# Patient Record
Sex: Female | Born: 1965 | Race: White | Hispanic: No | Marital: Married | Smoking: Current every day smoker
Health system: Southern US, Community
[De-identification: ages and names within clinical notes are randomized; demographics above are authoritative.]

## PROBLEM LIST (undated history)

## (undated) DIAGNOSIS — I499 Cardiac arrhythmia, unspecified: Secondary | ICD-10-CM

## (undated) DIAGNOSIS — E7800 Pure hypercholesterolemia, unspecified: Secondary | ICD-10-CM

## (undated) DIAGNOSIS — J449 Chronic obstructive pulmonary disease, unspecified: Secondary | ICD-10-CM

## (undated) DIAGNOSIS — J45909 Unspecified asthma, uncomplicated: Secondary | ICD-10-CM

## (undated) DIAGNOSIS — K219 Gastro-esophageal reflux disease without esophagitis: Secondary | ICD-10-CM

## (undated) DIAGNOSIS — F419 Anxiety disorder, unspecified: Secondary | ICD-10-CM

## (undated) DIAGNOSIS — R000 Tachycardia, unspecified: Secondary | ICD-10-CM

## (undated) DIAGNOSIS — R7303 Prediabetes: Secondary | ICD-10-CM

## (undated) HISTORY — DX: Prediabetes: R73.03

## (undated) HISTORY — DX: Tachycardia, unspecified: R00.0

## (undated) HISTORY — DX: Chronic obstructive pulmonary disease, unspecified: J44.9

## (undated) HISTORY — PX: YAG LASER APPLICATION: SHX6189

## (undated) HISTORY — DX: Unspecified asthma, uncomplicated: J45.909

## (undated) HISTORY — PX: EYE SURGERY: SHX253

---

## 1995-08-10 HISTORY — PX: OVARIAN CYST SURGERY: SHX726

## 1998-08-09 HISTORY — PX: TUBAL LIGATION: SHX77

## 2000-12-19 ENCOUNTER — Other Ambulatory Visit: Admission: RE | Admit: 2000-12-19 | Discharge: 2000-12-19 | Payer: Self-pay | Admitting: *Deleted

## 2001-12-02 ENCOUNTER — Emergency Department (HOSPITAL_COMMUNITY): Admission: EM | Admit: 2001-12-02 | Discharge: 2001-12-02 | Payer: Self-pay | Admitting: Internal Medicine

## 2001-12-02 ENCOUNTER — Encounter: Payer: Self-pay | Admitting: Internal Medicine

## 2002-05-29 ENCOUNTER — Ambulatory Visit (HOSPITAL_COMMUNITY): Admission: RE | Admit: 2002-05-29 | Discharge: 2002-05-29 | Payer: Self-pay | Admitting: *Deleted

## 2002-05-29 ENCOUNTER — Encounter: Payer: Self-pay | Admitting: *Deleted

## 2002-10-11 ENCOUNTER — Encounter: Payer: Self-pay | Admitting: Family Medicine

## 2002-10-11 ENCOUNTER — Ambulatory Visit (HOSPITAL_COMMUNITY): Admission: RE | Admit: 2002-10-11 | Discharge: 2002-10-11 | Payer: Self-pay | Admitting: Family Medicine

## 2004-05-28 ENCOUNTER — Ambulatory Visit (HOSPITAL_COMMUNITY): Admission: RE | Admit: 2004-05-28 | Discharge: 2004-05-28 | Payer: Self-pay | Admitting: *Deleted

## 2004-11-02 ENCOUNTER — Ambulatory Visit (HOSPITAL_COMMUNITY): Admission: RE | Admit: 2004-11-02 | Discharge: 2004-11-02 | Payer: Self-pay | Admitting: Family Medicine

## 2005-05-31 ENCOUNTER — Ambulatory Visit (HOSPITAL_COMMUNITY): Admission: RE | Admit: 2005-05-31 | Discharge: 2005-05-31 | Payer: Self-pay | Admitting: *Deleted

## 2005-10-06 ENCOUNTER — Ambulatory Visit (HOSPITAL_COMMUNITY): Admission: RE | Admit: 2005-10-06 | Discharge: 2005-10-06 | Payer: Self-pay | Admitting: Family Medicine

## 2006-06-29 ENCOUNTER — Ambulatory Visit (HOSPITAL_COMMUNITY): Admission: RE | Admit: 2006-06-29 | Discharge: 2006-06-29 | Payer: Self-pay | Admitting: Family Medicine

## 2007-03-29 ENCOUNTER — Ambulatory Visit (HOSPITAL_COMMUNITY): Admission: RE | Admit: 2007-03-29 | Discharge: 2007-03-29 | Payer: Self-pay | Admitting: Family Medicine

## 2008-01-11 ENCOUNTER — Ambulatory Visit (HOSPITAL_COMMUNITY): Admission: RE | Admit: 2008-01-11 | Discharge: 2008-01-11 | Payer: Self-pay | Admitting: Family Medicine

## 2008-02-27 ENCOUNTER — Ambulatory Visit (HOSPITAL_COMMUNITY): Admission: RE | Admit: 2008-02-27 | Discharge: 2008-02-27 | Payer: Self-pay | Admitting: Family Medicine

## 2008-07-03 ENCOUNTER — Ambulatory Visit (HOSPITAL_COMMUNITY): Admission: RE | Admit: 2008-07-03 | Discharge: 2008-07-03 | Payer: Self-pay | Admitting: Family Medicine

## 2008-12-02 ENCOUNTER — Ambulatory Visit (HOSPITAL_COMMUNITY): Admission: RE | Admit: 2008-12-02 | Discharge: 2008-12-02 | Payer: Self-pay | Admitting: Family Medicine

## 2009-02-28 ENCOUNTER — Ambulatory Visit (HOSPITAL_COMMUNITY): Admission: RE | Admit: 2009-02-28 | Discharge: 2009-02-28 | Payer: Self-pay | Admitting: Family Medicine

## 2009-05-16 ENCOUNTER — Ambulatory Visit (HOSPITAL_COMMUNITY): Admission: RE | Admit: 2009-05-16 | Discharge: 2009-05-16 | Payer: Self-pay | Admitting: Family Medicine

## 2009-09-15 ENCOUNTER — Ambulatory Visit (HOSPITAL_COMMUNITY): Admission: RE | Admit: 2009-09-15 | Discharge: 2009-09-15 | Payer: Self-pay | Admitting: Family Medicine

## 2010-03-02 ENCOUNTER — Ambulatory Visit (HOSPITAL_COMMUNITY): Admission: RE | Admit: 2010-03-02 | Discharge: 2010-03-02 | Payer: Self-pay | Admitting: Family Medicine

## 2010-03-11 ENCOUNTER — Ambulatory Visit (HOSPITAL_COMMUNITY): Admission: RE | Admit: 2010-03-11 | Discharge: 2010-03-11 | Payer: Self-pay | Admitting: Family Medicine

## 2010-07-17 ENCOUNTER — Ambulatory Visit (HOSPITAL_COMMUNITY)
Admission: RE | Admit: 2010-07-17 | Discharge: 2010-07-17 | Payer: Self-pay | Source: Home / Self Care | Attending: Family Medicine | Admitting: Family Medicine

## 2010-08-29 ENCOUNTER — Encounter: Payer: Self-pay | Admitting: Family Medicine

## 2010-08-30 ENCOUNTER — Encounter: Payer: Self-pay | Admitting: Family Medicine

## 2010-09-02 ENCOUNTER — Other Ambulatory Visit (HOSPITAL_COMMUNITY): Payer: Self-pay | Admitting: Family Medicine

## 2010-09-02 DIAGNOSIS — Z09 Encounter for follow-up examination after completed treatment for conditions other than malignant neoplasm: Secondary | ICD-10-CM

## 2010-09-16 ENCOUNTER — Ambulatory Visit (HOSPITAL_COMMUNITY)
Admission: RE | Admit: 2010-09-16 | Discharge: 2010-09-16 | Disposition: A | Discharge status: Home / Self Care | Payer: BC Managed Care – PPO | Source: Ambulatory Visit | Attending: Family Medicine | Admitting: Family Medicine

## 2010-09-16 DIAGNOSIS — R928 Other abnormal and inconclusive findings on diagnostic imaging of breast: Secondary | ICD-10-CM | POA: Insufficient documentation

## 2010-09-16 DIAGNOSIS — Z09 Encounter for follow-up examination after completed treatment for conditions other than malignant neoplasm: Secondary | ICD-10-CM

## 2011-02-25 ENCOUNTER — Other Ambulatory Visit: Payer: Self-pay | Admitting: Family Medicine

## 2011-02-25 DIAGNOSIS — Z09 Encounter for follow-up examination after completed treatment for conditions other than malignant neoplasm: Secondary | ICD-10-CM

## 2011-04-07 ENCOUNTER — Ambulatory Visit (HOSPITAL_COMMUNITY)
Admission: RE | Admit: 2011-04-07 | Discharge: 2011-04-07 | Disposition: A | Discharge status: Home / Self Care | Payer: BC Managed Care – PPO | Source: Ambulatory Visit | Attending: Family Medicine | Admitting: Family Medicine

## 2011-04-07 DIAGNOSIS — Z09 Encounter for follow-up examination after completed treatment for conditions other than malignant neoplasm: Secondary | ICD-10-CM

## 2011-04-07 DIAGNOSIS — R928 Other abnormal and inconclusive findings on diagnostic imaging of breast: Secondary | ICD-10-CM | POA: Insufficient documentation

## 2011-08-10 HISTORY — PX: ESOPHAGOGASTRODUODENOSCOPY: SHX1529

## 2011-08-10 HISTORY — PX: COLONOSCOPY: SHX174

## 2011-12-09 LAB — CBC WITH DIFFERENTIAL/PLATELET
%SAT: 26
ALT: 13 U/L (ref 7–35)
Albumin: 5
Bilirubin, Indirect: 0.2
Ferritin: 39 ng/mL (ref 9.0–150.0)
Lipase: 17 units/L (ref 0–53)
TIBC: 396
Total Protein: 7 g/dL
UIBC: 293
platelet count: 304

## 2012-01-11 ENCOUNTER — Encounter: Payer: Self-pay | Admitting: Urgent Care

## 2012-01-11 ENCOUNTER — Other Ambulatory Visit: Payer: Self-pay | Admitting: Gastroenterology

## 2012-01-11 ENCOUNTER — Ambulatory Visit (INDEPENDENT_AMBULATORY_CARE_PROVIDER_SITE_OTHER): Payer: BC Managed Care – PPO | Admitting: Urgent Care

## 2012-01-11 VITALS — BP 129/75 | HR 81 | Temp 99.5°F | Ht 63.0 in | Wt 102.2 lb

## 2012-01-11 DIAGNOSIS — K219 Gastro-esophageal reflux disease without esophagitis: Secondary | ICD-10-CM

## 2012-01-11 DIAGNOSIS — R110 Nausea: Secondary | ICD-10-CM | POA: Insufficient documentation

## 2012-01-11 DIAGNOSIS — R1310 Dysphagia, unspecified: Secondary | ICD-10-CM

## 2012-01-11 DIAGNOSIS — R197 Diarrhea, unspecified: Secondary | ICD-10-CM

## 2012-01-11 DIAGNOSIS — R1319 Other dysphagia: Secondary | ICD-10-CM | POA: Insufficient documentation

## 2012-01-11 DIAGNOSIS — K529 Noninfective gastroenteritis and colitis, unspecified: Secondary | ICD-10-CM | POA: Insufficient documentation

## 2012-01-11 DIAGNOSIS — R1012 Left upper quadrant pain: Secondary | ICD-10-CM | POA: Insufficient documentation

## 2012-01-11 MED ORDER — DEXLANSOPRAZOLE 60 MG PO CPDR: 60.0000 mg | DELAYED_RELEASE_CAPSULE | Freq: Every day | ORAL | Status: DC

## 2012-01-11 MED ORDER — PEG-KCL-NACL-NASULF-NA ASC-C 100 G PO SOLR: 1.0000 | Freq: Once | ORAL | Status: DC

## 2012-01-11 MED ORDER — HYOSCYAMINE SULFATE 0.125 MG SL SUBL: 0.1250 mg | SUBLINGUAL_TABLET | Freq: Three times a day (TID) | SUBLINGUAL | Status: DC

## 2012-01-11 NOTE — Assessment & Plan Note (Signed)
EGD with possible esophageal dilation with Dr. Jena Gauss to look for esophageal web, ring, or stricture.

## 2012-01-11 NOTE — Patient Instructions (Signed)
Please return stool studies as soon as possible for diarrhea You need a colonoscopy and EGD (upper endoscopy) with possible dilation of your esophagus with Dr. Jena Gauss. Begin Dexilant 60 mg daily for acid reflux Stop Nexium Use Levsin 0.125 mg before meals and at bedtime if you need for diarrhea/cramps 1-800-quit-now for help with quitting smoking

## 2012-01-11 NOTE — Assessment & Plan Note (Signed)
Persistent heartburn indigestion despite PPI. For EGD.  Begin Dexilant 60 mg daily for acid reflux Stop Nexium  1-800-quit-now for help with quitting smoking

## 2012-01-11 NOTE — Assessment & Plan Note (Signed)
See LUQ pain/GERD

## 2012-01-11 NOTE — Assessment & Plan Note (Signed)
History of worsening of chronic diarrhea over the past 3 months.  Self diagnosed with possible irritable bowel syndrome, but symptoms exacerbated with left upper quadrant pain over the last 3 months.  Differentials include irritable bowel syndrome, celiac disease, colorectal carcinoma or polyp, less likely microscopic colitis or pancreatic insufficiency. Colonoscopy for further evaluation.  I have discussed risks & benefits which include, but are not limited to, bleeding, infection, perforation & drug reaction.  The patient agrees with this plan & written consent will be obtained.  Full set of Stool studies if diarrhea persists.  Use Levsin 0.125 mg before meals and at bedtime if you need for diarrhea/cramps

## 2012-01-11 NOTE — Progress Notes (Signed)
Referring Provider: Luking, Scott A, MD Primary Care Physician:  LUKING,SCOTT, MD, MD Primary Gastroenterologist:  Dr. Rourk  Chief Complaint  Patient presents with  . Abdominal Pain  . Nausea    HPI:  Isabel Atkinson is a 45 y.o. female here as a referral from Dr. Luking for gastritis.  She tells me for the past 3mo she has had LUQ pain usually followed by diarrhea.  Normally she has 3 BMs per day, however on the days that she has diarrhea she has problems for an hour straight with multiple loose stools.  She has constant nausea without vomiting.  Felt at first it was due to sinus drainage, but now she is concerned she may have ulcers. Her nausea is worse at night after going to bed.  He tells me she's been under a significant amount of stress after the loss of her father 4 years ago. She has tried nexium 40mg daily for the last month. She reports a 50% relief of her symptoms. She continues to have daily heartburn and indigestion.  Denies rectal bleeding or melena.  Wt stable.  Denies anorexia. She rarely takes ibuprofen a couple times per mo.  she is having problems with solid food dysphagia and feels as though things like french fries stick in her chest.  At times she feels like she may be having a heart attack after swallowing chest pain.  No new meds.  Labs reviewed from 12/09/11: Iron normal, TIBC normal, CBC normal, LFTs normal, lipase and ferritin normal.  No past medical history on file.  Past Surgical History  Procedure Date  . Tubal ligation 2000  . Ovarian cyst surgery 1997    removed    Current Outpatient Prescriptions  Medication Sig Dispense Refill  . acetaminophen (TYLENOL) 500 MG tablet Take 500 mg by mouth every 6 (six) hours as needed.      . aspirin 81 MG tablet Take 81 mg by mouth daily.      . ibuprofen (ADVIL,MOTRIN) 200 MG tablet Take 200 mg by mouth every 6 (six) hours as needed.      . Multiple Vitamin (MULTIVITAMIN) capsule Take 1 capsule by mouth daily.      .  NEXIUM 40 MG capsule Take 40 mg by mouth daily before breakfast.       . dexlansoprazole (DEXILANT) 60 MG capsule Take 1 capsule (60 mg total) by mouth daily.  31 capsule  2  . hyoscyamine (LEVSIN SL) 0.125 MG SL tablet Place 1 tablet (0.125 mg total) under the tongue 4 (four) times daily -  before meals and at bedtime. When necessary diarrhea abdominal pain  90 tablet  1  . peg 3350 powder (MOVIPREP) 100 G SOLR Take 1 kit (100 g total) by mouth once. As directed Please purchase 1 Fleets enema to use with the prep  1 kit  0    Allergies as of 01/11/2012 - Review Complete 01/11/2012  Allergen Reaction Noted  . Sulfa antibiotics Itching 01/11/2012    Family History  Problem Relation Age of Onset  . Colon cancer Maternal Grandfather 80  . Colon polyps Mother 64  . Colon cancer Maternal Uncle 65    History   Social History  . Marital Status: Married    Spouse Name: N/A    Number of Children: 2  . 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 Smoking status: Current Everyday Smoker -- 1.0   packs/day for 25 years    Types: Cigarettes  . Smokeless tobacco: Not on file  . Alcohol Use: No  . Drug Use: No  . Sexually Active: Not on file   Other Topics Concern  . Not on file   Social History Narrative   2 children -21/13   Review of Systems: Gen: Denies any fever, chills, sweats, anorexia, fatigue, weakness, malaise, weight loss, and sleep disorder CV: Denies chest pain, angina, palpitations, syncope, orthopnea, PND, peripheral edema, and claudication. Resp: Denies dyspnea at rest, dyspnea with exercise, cough, sputum, wheezing, coughing up blood, and pleurisy. GI: Denies vomiting blood, jaundice, and fecal incontinence.   GU : Denies urinary burning, blood in urine, urinary frequency, urinary hesitancy, nocturnal urination, and urinary incontinence. MS: Denies joint pain, limitation of movement, and swelling, stiffness, low  back pain, extremity pain. Denies muscle weakness, cramps, atrophy.  Derm: Denies rash, itching, dry skin, hives, moles, warts, or unhealing ulcers.  Psych: Denies depression, anxiety, memory loss, suicidal ideation, hallucinations, paranoia, and confusion. Heme: Denies bruising, bleeding, and enlarged lymph nodes. Neuro:  Denies any headaches, dizziness, paresthesias. Endo:  Denies any problems with DM, thyroid, adrenal function.  Physical Exam: BP 129/75  Pulse 81  Temp(Src) 99.5 F (37.5 C) (Temporal)  Ht 5' 3" (1.6 m)  Wt 102 lb 3.2 oz (46.358 kg)  BMI 18.10 kg/m2 General:   Alert,  Well-developed, thin, pleasant and cooperative in NAD. Head:  Normocephalic and atraumatic. Eyes:  Sclera clear, no icterus.   Conjunctiva pink. Ears:  Normal auditory acuity. Nose:  No deformity, discharge, or lesions. Mouth:  No deformity or lesions,oropharynx pink & moist. Neck:  Supple; no masses or thyromegaly. Lungs:  Clear throughout to auscultation.   No wheezes, crackles, or rhonchi. No acute distress. Heart:  Regular rate and rhythm; no murmurs, clicks, rubs,  or gallops. Abdomen:  Normal bowel sounds.  No bruits.  Soft, non-tender and non-distended without masses, hepatosplenomegaly or hernias noted.  No guarding or rebound tenderness.   Rectal:  Deferred. Msk:  Symmetrical without gross deformities. Normal posture. Pulses:  Normal pulses noted. Extremities:  No clubbing or edema. Neurologic:  Alert and oriented x4;  grossly normal neurologically. Skin:  Intact without significant lesions or rashes. Lymph Nodes:  No significant cervical adenopathy. Psych:  Alert and cooperative. Normal mood and affect.  

## 2012-01-11 NOTE — Assessment & Plan Note (Signed)
Isabel Atkinson is a pleasant 46 y.o. female with three-month history of left upper quadrant pain, nausea, heart burn, indigestion and intermittent diarrhea.  He has had minimal relief with PPI.  EGD for further evaluation to rule out gastritis, H. pylori, peptic ulcer disease, celiac disease.  I have discussed risks & benefits which include, but are not limited to, bleeding, infection, perforation & drug reaction.  The patient agrees with this plan & written consent will be obtained.

## 2012-01-12 NOTE — Progress Notes (Signed)
Quick Note:  Reviewed @ office visit ______ 

## 2012-01-12 NOTE — Progress Notes (Signed)
Faxed to PCP

## 2012-01-18 ENCOUNTER — Telehealth: Payer: Self-pay | Admitting: Urgent Care

## 2012-01-18 NOTE — Telephone Encounter (Signed)
Please call to let patient know insurance is requiring she take omeprazole prior to filling Dexilant. Omeprazole 20 mg daily, #31,  2 RF. Call and let us know if not working. Thanks

## 2012-01-18 NOTE — Telephone Encounter (Signed)
Tried to call with no answer  

## 2012-01-19 NOTE — Telephone Encounter (Signed)
Tried to call pt- LMOM 

## 2012-01-19 NOTE — Telephone Encounter (Signed)
Pt aware, rx called into [facility].

## 2012-01-31 ENCOUNTER — Encounter (HOSPITAL_COMMUNITY): Payer: Self-pay | Admitting: Pharmacy Technician

## 2012-02-07 MED ORDER — SODIUM CHLORIDE 0.45 % IV SOLN
Freq: Once | INTRAVENOUS | Status: AC
  Administered 2012-02-08: 20 mL/h via INTRAVENOUS

## 2012-02-08 ENCOUNTER — Ambulatory Visit (HOSPITAL_COMMUNITY)
Admission: RE | Admit: 2012-02-08 | Discharge: 2012-02-08 | Disposition: A | Discharge status: Home / Self Care | Payer: BC Managed Care – PPO | Source: Ambulatory Visit | Attending: Internal Medicine | Admitting: Internal Medicine

## 2012-02-08 ENCOUNTER — Encounter (HOSPITAL_COMMUNITY): Payer: Self-pay | Admitting: *Deleted

## 2012-02-08 ENCOUNTER — Encounter (HOSPITAL_COMMUNITY)
Admission: RE | Disposition: A | Discharge status: Home / Self Care | Payer: Self-pay | Source: Ambulatory Visit | Attending: Internal Medicine

## 2012-02-08 DIAGNOSIS — R1310 Dysphagia, unspecified: Secondary | ICD-10-CM

## 2012-02-08 DIAGNOSIS — R197 Diarrhea, unspecified: Secondary | ICD-10-CM | POA: Insufficient documentation

## 2012-02-08 DIAGNOSIS — D126 Benign neoplasm of colon, unspecified: Secondary | ICD-10-CM

## 2012-02-08 DIAGNOSIS — K219 Gastro-esophageal reflux disease without esophagitis: Secondary | ICD-10-CM

## 2012-02-08 HISTORY — DX: Anxiety disorder, unspecified: F41.9

## 2012-02-08 SURGERY — COLONOSCOPY WITH ESOPHAGOGASTRODUODENOSCOPY (EGD)
Anesthesia: Moderate Sedation

## 2012-02-08 MED ORDER — MIDAZOLAM HCL 5 MG/5ML IJ SOLN
INTRAMUSCULAR | Status: AC
  Filled 2012-02-08: qty 10

## 2012-02-08 MED ORDER — MEPERIDINE HCL 100 MG/ML IJ SOLN
INTRAMUSCULAR | Status: AC
  Filled 2012-02-08: qty 1

## 2012-02-08 MED ORDER — BUTAMBEN-TETRACAINE-BENZOCAINE 2-2-14 % EX AERO
INHALATION_SPRAY | CUTANEOUS | Status: DC | PRN
  Administered 2012-02-08: 2 via TOPICAL

## 2012-02-08 MED ORDER — MIDAZOLAM HCL 5 MG/5ML IJ SOLN
INTRAMUSCULAR | Status: DC | PRN
  Administered 2012-02-08 (×2): 1 mg via INTRAVENOUS
  Administered 2012-02-08 (×2): 2 mg via INTRAVENOUS

## 2012-02-08 MED ORDER — STERILE WATER FOR IRRIGATION IR SOLN
Status: DC | PRN
  Administered 2012-02-08: 08:00:00

## 2012-02-08 MED ORDER — MEPERIDINE HCL 100 MG/ML IJ SOLN
INTRAMUSCULAR | Status: DC | PRN
  Administered 2012-02-08: 25 mg via INTRAVENOUS
  Administered 2012-02-08: 50 mg via INTRAVENOUS
  Administered 2012-02-08: 25 mg via INTRAVENOUS

## 2012-02-08 NOTE — Interval H&P Note (Signed)
History and Physical Interval Note:  02/08/2012 7:50 AM  Isabel Atkinson  has presented today for surgery, with the diagnosis of GERD, LUQ pain, chronic diarrhea,nausea  The various methods of treatment have been discussed with the patient and family. After consideration of risks, benefits and other options for treatment, the patient has consented to  Procedure(s) (LRB): COLONOSCOPY WITH ESOPHAGOGASTRODUODENOSCOPY (EGD) (N/A) as a surgical intervention .  The patient's history has been reviewed, patient examined, no change in status, stable for surgery.  I have reviewed the patients' chart and labs.  Questions were answered to the patient's satisfaction.     Eula Listen

## 2012-02-08 NOTE — Discharge Instructions (Addendum)
Colonoscopy Discharge Instructions  Read the instructions outlined below and refer to this sheet in the next few weeks. These discharge instructions provide you with general information on caring for yourself after you leave the hospital. Your doctor may also give you specific instructions. While your treatment has been planned according to the most current medical practices available, unavoidable complications occasionally occur. If you have any problems or questions after discharge, call Dr. Gala Romney at [phone]. ACTIVITY  You may resume your regular activity, but move at a slower pace for the next 24 hours.   Take frequent rest periods for the next 24 hours.   Walking will help get rid of the air and reduce the bloated feeling in your belly (abdomen).   No driving for 24 hours (because of the medicine (anesthesia) used during the test).    Do not sign any important legal documents or operate any machinery for 24 hours (because of the anesthesia used during the test).  NUTRITION  Drink plenty of fluids.   You may resume your normal diet as instructed by your doctor.   Begin with a light meal and progress to your normal diet. Heavy or fried foods are harder to digest and may make you feel sick to your stomach (nauseated).   Avoid alcoholic beverages for 24 hours or as instructed.  MEDICATIONS  You may resume your normal medications unless your doctor tells you otherwise.  WHAT YOU CAN EXPECT TODAY  Some feelings of bloating in the abdomen.   Passage of more gas than usual.   Spotting of blood in your stool or on the toilet paper.  IF YOU HAD POLYPS REMOVED DURING THE COLONOSCOPY:  No aspirin products for 7 days or as instructed.   No alcohol for 7 days or as instructed.   Eat a soft diet for the next 24 hours.  FINDING OUT THE RESULTS OF YOUR TEST Not all test results are available during your visit. If your test results are not back during the visit, make an appointment  with your caregiver to find out the results. Do not assume everything is normal if you have not heard from your caregiver or the medical facility. It is important for you to follow up on all of your test results.  SEEK IMMEDIATE MEDICAL ATTENTION IF:  You have more than a spotting of blood in your stool.   Your belly is swollen (abdominal distention).   You are nauseated or vomiting.   You have a temperature over 101.  You have abdominal pain or discomfort that is severe or gets worse throughout the day. EGD Discharge instructions Please read the instructions outlined below and refer to this sheet in the next few weeks. These discharge instructions provide you with general information on caring for yourself after you leave the hospital. Your doctor may also give you specific instructions. While your treatment has been planned according to the most current medical practices available, unavoidable complications occasionally occur. If you have any problems or questions after discharge, please call your doctor. ACTIVITY You may resume your regular activity but move at a slower pace for the next 24 hours.  Take frequent rest periods for the next 24 hours.  Walking will help expel (get rid of) the air and reduce the bloated feeling in your abdomen.  No driving for 24 hours (because of the anesthesia (medicine) used during the test).  You may shower.  Do not sign any important legal documents or operate any machinery for 24  hours (because of the anesthesia used during the test).  NUTRITION Drink plenty of fluids.  You may resume your normal diet.  Begin with a light meal and progress to your normal diet.  Avoid alcoholic beverages for 24 hours or as instructed by your caregiver.  MEDICATIONS You may resume your normal medications unless your caregiver tells you otherwise.  WHAT YOU CAN EXPECT TODAY You may experience abdominal discomfort such as a feeling of fullness or "gas" pains.   FOLLOW-UP Your doctor will discuss the results of your test with you.  SEEK IMMEDIATE MEDICAL ATTENTION IF ANY OF THE FOLLOWING OCCUR: Excessive nausea (feeling sick to your stomach) and/or vomiting.  Severe abdominal pain and distention (swelling).  Trouble swallowing.  Temperature over 101 F (37.8 C).  Rectal bleeding or vomiting of blood.    GERD and polyp information provided.  Continue Dexilant 60 mg orally daily.  Further recommendations to follow pending review of pathology report   No driving for 24 hours.   Colon Polyps A polyp is extra tissue that grows inside your body. Colon polyps grow in the large intestine. The large intestine, also called the colon, is part of your digestive system. It is a long, hollow tube at the end of your digestive tract where your body makes and stores stool. Most polyps are not dangerous. They are benign. This means they are not cancerous. But over time, some types of polyps can turn into cancer. Polyps that are smaller than a pea are usually not harmful. But larger polyps could someday become or may already be cancerous. To be safe, doctors remove all polyps and test them.  WHO GETS POLYPS? Anyone can get polyps, but certain people are more likely than others. You may have a greater chance of getting polyps if: You are over 50.  You have had polyps before.  Someone in your family has had polyps.  Someone in your family has had cancer of the large intestine.  Find out if someone in your family has had polyps. You may also be more likely to get polyps if you:  Eat a lot of fatty foods.  Smoke.  Drink alcohol.  Do not exercise.  Eat too much.  SYMPTOMS  Most small polyps do not cause symptoms. People often do not know they have one until their caregiver finds it during a regular checkup or while testing them for something else. Some people do have symptoms like these: Bleeding from the anus. You might notice blood on your underwear or on  toilet paper after you have had a bowel movement.  Constipation or diarrhea that lasts more than a week.  Blood in the stool. Blood can make stool look black or it can show up as red streaks in the stool.  If you have any of these symptoms, see your caregiver. HOW DOES THE DOCTOR TEST FOR POLYPS? The doctor can use four tests to check for polyps: Digital rectal exam. The caregiver wears gloves and checks your rectum (the last part of the large intestine) to see if it feels normal. This test would find polyps only in the rectum. Your caregiver may need to do one of the other tests listed below to find polyps higher up in the intestine.  Barium enema. The caregiver puts a liquid called barium into your rectum before taking x-rays of your large intestine. Barium makes your intestine look white in the pictures. Polyps are dark, so they are easy to see.  Sigmoidoscopy. With this  test, the caregiver can see inside your large intestine. A thin flexible tube is placed into your rectum. The device is called a sigmoidoscope, which has a light and a tiny video camera in it. The caregiver uses the sigmoidoscope to look at the last third of your large intestine.  Colonoscopy. This test is like sigmoidoscopy, but the caregiver looks at all of the large intestine. It usually requires sedation. This is the most common method for finding and removing polyps.  TREATMENT  The caregiver will remove the polyp during sigmoidoscopy or colonoscopy. The polyp is then tested for cancer.  If you have had polyps, your caregiver may want you to get tested regularly in the future.  PREVENTION  There is not one sure way to prevent polyps. You might be able to lower your risk of getting them if you: Eat more fruits and vegetables and less fatty food.  Do not smoke.  Avoid alcohol.  Exercise every day.  Lose weight if you are overweight.  Eating more calcium and folate can also lower your risk of getting polyps. Some foods that  are rich in calcium are milk, cheese, and broccoli. Some foods that are rich in folate are chickpeas, kidney beans, and spinach.  Aspirin might help prevent polyps. Studies are under way.  Document Released: 04/21/2004 Document Revised: 07/15/2011 Document Reviewed: 09/27/2007 [facility] Patient Information 2012 [place], [place].Gastroesophageal Reflux Disease, Adult Gastroesophageal reflux disease (GERD) happens when acid from your stomach flows up into the esophagus. When acid comes in contact with the esophagus, the acid causes soreness (inflammation) in the esophagus. Over time, GERD may create small holes (ulcers) in the lining of the esophagus. CAUSES   Increased body weight. This puts pressure on the stomach, making acid rise from the stomach into the esophagus.   Smoking. This increases acid production in the stomach.   Drinking alcohol. This causes decreased pressure in the lower esophageal sphincter (valve or ring of muscle between the esophagus and stomach), allowing acid from the stomach into the esophagus.   Late evening meals and a full stomach. This increases pressure and acid production in the stomach.   A malformed lower esophageal sphincter.  Sometimes, no cause is found. SYMPTOMS   Burning pain in the lower part of the mid-chest behind the breastbone and in the mid-stomach area. This may occur twice a week or more often.   Trouble swallowing.   Sore throat.   Dry cough.   Asthma-like symptoms including chest tightness, shortness of breath, or wheezing.  DIAGNOSIS  Your caregiver may be able to diagnose GERD based on your symptoms. In some cases, X-rays and other tests may be done to check for complications or to check the condition of your stomach and esophagus. TREATMENT  Your caregiver may recommend over-the-counter or prescription medicines to help decrease acid production. Ask your caregiver before starting or adding any new medicines.  HOME CARE INSTRUCTIONS    Change the factors that you can control. Ask your caregiver for guidance concerning weight loss, quitting smoking, and alcohol consumption.   Avoid foods and drinks that make your symptoms worse, such as:   Caffeine or alcoholic drinks.   Chocolate.   Peppermint or mint flavorings.   Garlic and onions.   Spicy foods.   Citrus fruits, such as oranges, lemons, or limes.   Tomato-based foods such as sauce, chili, salsa, and pizza.   Fried and fatty foods.   Avoid lying down for the 3 hours prior to your bedtime  or prior to taking a nap.   Eat small, frequent meals instead of large meals.   Wear loose-fitting clothing. Do not wear anything tight around your waist that causes pressure on your stomach.   Raise the head of your bed 6 to 8 inches with wood blocks to help you sleep. Extra pillows will not help.   Only take over-the-counter or prescription medicines for pain, discomfort, or fever as directed by your caregiver.   Do not take aspirin, ibuprofen, or other nonsteroidal anti-inflammatory drugs (NSAIDs).  SEEK IMMEDIATE MEDICAL CARE IF:   You have pain in your arms, neck, jaw, teeth, or back.   Your pain increases or changes in intensity or duration.   You develop nausea, vomiting, or sweating (diaphoresis).   You develop shortness of breath, or you faint.   Your vomit is green, yellow, black, or looks like coffee grounds or blood.   Your stool is red, bloody, or black.  These symptoms could be signs of other problems, such as heart disease, gastric bleeding, or esophageal bleeding. MAKE SURE YOU:   Understand these instructions.   Will watch your condition.   Will get help right away if you are not doing well or get worse.  Document Released: 05/05/2005 Document Revised: 07/15/2011 Document Reviewed: 02/12/2011 [facility] Patient Information 2012 [place], [place].

## 2012-02-08 NOTE — Op Note (Signed)
[facility] [address] [place], [place]  [address]  COLONOSCOPY PROCEDURE REPORT  PATIENT:  Isabel, Atkinson  MR#:  [id number] BIRTHDATE:  [birth year], 45 yrs. old  GENDER:  female ENDOSCOPIST:  R. Roetta Sessions, MD FACP [facility] REF. BY:  Lilyan Punt, M.D. PROCEDURE DATE:  02/08/2012 PROCEDURE:  Ileocolonoscopy with biopsy  INDICATIONS:  Chronic diarrhea. See EGD report  INFORMED CONSENT:  The risks, benefits, alternatives and imponderables including but not limited to bleeding, perforation as well as the possibility of a missed lesion have been reviewed. The potential for biopsy, lesion removal, etc. have also been discussed.  Questions have been answered.  All parties agreeable. Please see the history and physical in the medical record for more information.  MEDICATIONS:  Demerol 100 mg IV and Versed 7 mg IV in divided doses.  DESCRIPTION OF PROCEDURE:  After a digital rectal exam was performed, the EC-[id number] ([id number]) colonoscope was advanced from the anus through the rectum and colon to the area of the cecum, ileocecal valve and appendiceal orifice.  The cecum was deeply intubated.  These structures were well-seen and photographed for the record.  From the level of the cecum and ileocecal valve, the scope was slowly and cautiously withdrawn.  The mucosal surfaces were carefully surveyed utilizing scope tip deflection to facilitate fold flattening as needed.  The scope was pulled down into the rectum where a thorough examination was performed. <<PROCEDUREIMAGES>>  FINDINGS: Good preparation. Anal papilla; otherwise normal rectum. Rectal vault too small to retroflex. Rectal mucosal well-seen on face. Normal-appearing colonic mucosa aside from a single diminutive sigmoid polyp. The distal 10 cm of terminal ileum mucosa appeared normal.  THERAPEUTIC / DIAGNOSTIC MANEUVERS PERFORMED:  Signal biopsies of the ascending and sigmoid segments were taken to evaluate  for microscopic colitis.  The single diminutive sigmoid polyp noted above was cold biopsied/removed.  COMPLICATIONS:  None  CECAL WITHDRAWAL TIME: 40 minutes  IMPRESSION: Normal rectum. Single diminutive sigmoid polyp-removed as described above. Normal terminal ileum .Status post segmental biopsy.  RECOMMENDATIONS:  Follow up on pathology. See EGD report. Continue Dexilant 60 mg orally daily.  ______________________________ R. Roetta Sessions, MD Caleen Essex  CC:  Lilyan Punt, M.D.  n. eSIGNED:   R. Roetta Sessions at 02/08/2012 08:45 AM  Rickey Barbara, [phone]

## 2012-02-08 NOTE — H&P (View-Only) (Signed)
Referring Provider: Babs Sciara, MD Primary Care Physician:  Lilyan Punt, MD, MD Primary Gastroenterologist:  Dr. Jena Gauss  Chief Complaint  Patient presents with  . Abdominal Pain  . Nausea    HPI:  Isabel Atkinson is a 46 y.o. female here as a referral from Dr. Gerda Diss for gastritis.  She tells me for the past 63mo she has had LUQ pain usually followed by diarrhea.  Normally she has 3 BMs per day, however on the days that she has diarrhea she has problems for an hour straight with multiple loose stools.  She has constant nausea without vomiting.  Felt at first it was due to sinus drainage, but now she is concerned she may have ulcers. Her nausea is worse at night after going to bed.  He tells me she's been under a significant amount of stress after the loss of her father 4 years ago. She has tried nexium 40mg  daily for the last month. She reports a 50% relief of her symptoms. She continues to have daily heartburn and indigestion.  Denies rectal bleeding or melena.  Wt stable.  Denies anorexia. She rarely takes ibuprofen a couple times per mo.  she is having problems with solid food dysphagia and feels as though things like french fries stick in her chest.  At times she feels like she may be having a heart attack after swallowing chest pain.  No new meds.  Labs reviewed from 12/09/11: Iron normal, TIBC normal, CBC normal, LFTs normal, lipase and ferritin normal.  No past medical history on file.  Past Surgical History  Procedure Date  . Tubal ligation 2000  . Ovarian cyst surgery 1997    removed    Current Outpatient Prescriptions  Medication Sig Dispense Refill  . acetaminophen (TYLENOL) 500 MG tablet Take 500 mg by mouth every 6 (six) hours as needed.      Marland Kitchen aspirin 81 MG tablet Take 81 mg by mouth daily.      Marland Kitchen ibuprofen (ADVIL,MOTRIN) 200 MG tablet Take 200 mg by mouth every 6 (six) hours as needed.      . Multiple Vitamin (MULTIVITAMIN) capsule Take 1 capsule by mouth daily.      Marland Kitchen  NEXIUM 40 MG capsule Take 40 mg by mouth daily before breakfast.       . dexlansoprazole (DEXILANT) 60 MG capsule Take 1 capsule (60 mg total) by mouth daily.  31 capsule  2  . hyoscyamine (LEVSIN SL) 0.125 MG SL tablet Place 1 tablet (0.125 mg total) under the tongue 4 (four) times daily -  before meals and at bedtime. When necessary diarrhea abdominal pain  90 tablet  1  . peg 3350 powder (MOVIPREP) 100 G SOLR Take 1 kit (100 g total) by mouth once. As directed Please purchase 1 Fleets enema to use with the prep  1 kit  0    Allergies as of 01/11/2012 - Review Complete 01/11/2012  Allergen Reaction Noted  . Sulfa antibiotics Itching 01/11/2012    Family History  Problem Relation Age of Onset  . Colon cancer Maternal Grandfather 80  . Colon polyps Mother 33  . Colon cancer Maternal Uncle 84    History   Social History  . Marital Status: Married    Spouse Name: N/A    Number of Children: 2  . 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 Smoking status: Current Everyday Smoker -- 1.0  packs/day for 25 years    Types: Cigarettes  . Smokeless tobacco: Not on file  . Alcohol Use: No  . Drug Use: No  . Sexually Active: Not on file   Other Topics Concern  . Not on file   Social History Narrative   2 children -21/13   Review of Systems: Gen: Denies any fever, chills, sweats, anorexia, fatigue, weakness, malaise, weight loss, and sleep disorder CV: Denies chest pain, angina, palpitations, syncope, orthopnea, PND, peripheral edema, and claudication. Resp: Denies dyspnea at rest, dyspnea with exercise, cough, sputum, wheezing, coughing up blood, and pleurisy. GI: Denies vomiting blood, jaundice, and fecal incontinence.   GU : Denies urinary burning, blood in urine, urinary frequency, urinary hesitancy, nocturnal urination, and urinary incontinence. MS: Denies joint pain, limitation of movement, and swelling, stiffness, low  back pain, extremity pain. Denies muscle weakness, cramps, atrophy.  Derm: Denies rash, itching, dry skin, hives, moles, warts, or unhealing ulcers.  Psych: Denies depression, anxiety, memory loss, suicidal ideation, hallucinations, paranoia, and confusion. Heme: Denies bruising, bleeding, and enlarged lymph nodes. Neuro:  Denies any headaches, dizziness, paresthesias. Endo:  Denies any problems with DM, thyroid, adrenal function.  Physical Exam: BP 129/75  Pulse 81  Temp(Src) 99.5 F (37.5 C) (Temporal)  Ht 5\' 3"  (1.6 m)  Wt 102 lb 3.2 oz (46.358 kg)  BMI 18.10 kg/m2 General:   Alert,  Well-developed, thin, pleasant and cooperative in NAD. Head:  Normocephalic and atraumatic. Eyes:  Sclera clear, no icterus.   Conjunctiva pink. Ears:  Normal auditory acuity. Nose:  No deformity, discharge, or lesions. Mouth:  No deformity or lesions,oropharynx pink & moist. Neck:  Supple; no masses or thyromegaly. Lungs:  Clear throughout to auscultation.   No wheezes, crackles, or rhonchi. No acute distress. Heart:  Regular rate and rhythm; no murmurs, clicks, rubs,  or gallops. Abdomen:  Normal bowel sounds.  No bruits.  Soft, non-tender and non-distended without masses, hepatosplenomegaly or hernias noted.  No guarding or rebound tenderness.   Rectal:  Deferred. Msk:  Symmetrical without gross deformities. Normal posture. Pulses:  Normal pulses noted. Extremities:  No clubbing or edema. Neurologic:  Alert and oriented x4;  grossly normal neurologically. Skin:  Intact without significant lesions or rashes. Lymph Nodes:  No significant cervical adenopathy. Psych:  Alert and cooperative. Normal mood and affect.

## 2012-02-08 NOTE — Op Note (Signed)
[facility] [address] [place], [place]  [address]  ENDOSCOPY PROCEDURE REPORT  PATIENT:  Isabel, Atkinson  MR#:  [id number] BIRTHDATE:  1965/08/21, 45 yrs. old  GENDER:  female  ENDOSCOPIST:  R. Roetta Sessions, MD Caleen Essex Referred by:  Lilyan Punt, M.D.  PROCEDURE DATE:  02/08/2012 PROCEDURE:  EGD with Elease Hashimoto dilation followed by small bowel biopsy  INDICATIONS:   GERD and esophageal dysphagia. Chronic diarrhea  INFORMED CONSENT:   The risks, benefits, limitations, alternatives and imponderables have been discussed.  The potential for biopsy, esophogeal dilation, etc. have also been reviewed.  Questions have been answered.  All parties agreeable.  Please see the history and physical in the medical record for more information.  MEDICATIONS:     Versed 5 mg IV and Demerol 100 mg IV in divided doses.  DESCRIPTION OF PROCEDURE:   The EG-[id number] ([id number]) endoscope was introduced through the mouth and advanced to the second portion of the duodenum without difficulty or limitations.  The mucosal surfaces were surveyed very carefully during advancement of the scope and upon withdrawal.  Retroflexion view of the proximal stomach and esophagogastric junction was performed.  <<PROCEDUREIMAGES>>  FINDINGS:  Normal tubular esophagus. Stomach empty.  Small hiatal hernia. Pylorus patent. Normal first and second portion of the duodenum  THERAPEUTIC / DIAGNOSTIC MANEUVERS PERFORMED:  A 54 French Maloney dilator was passed a full insertion easily. Look back revealed a tear the UES mucosa and disruption of a possible UES web. Subsequently, biopsies of the second and third portion of the duodenum were taken for histologic study.  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 Small hiatal hernia. Status post biopsy of normal         appearing small bowel to screen for celiac  disease.  RECOMMENDATIONS:    See colonoscopy report.  ______________________________ R. Roetta Sessions, MD Caleen Essex  CC:  n. eSIGNED:   R. Roetta Sessions at 02/08/2012 08:15 AM  Rickey Barbara, [phone]

## 2012-02-13 ENCOUNTER — Encounter: Payer: Self-pay | Admitting: Internal Medicine

## 2012-02-24 ENCOUNTER — Telehealth: Payer: Self-pay | Admitting: Urgent Care

## 2012-02-24 NOTE — Telephone Encounter (Signed)
Tried to call  Pt- LMOM

## 2012-02-24 NOTE — Progress Notes (Signed)
REVIEWED.  

## 2012-02-24 NOTE — Telephone Encounter (Signed)
Please see if stool studies were ever returned to lab. Thanks

## 2012-02-28 NOTE — Telephone Encounter (Signed)
Mailed letter to pt

## 2012-03-02 ENCOUNTER — Telehealth: Payer: Self-pay | Admitting: Internal Medicine

## 2012-03-02 MED ORDER — DEXLANSOPRAZOLE 60 MG PO CPDR: 60.0000 mg | DELAYED_RELEASE_CAPSULE | Freq: Every morning | ORAL | Status: DC

## 2012-03-02 NOTE — Telephone Encounter (Signed)
Patient is stating that the Prilosec is not work anymore and shes asking for a Rx of Dexilant be called to [place] also she received a letter stating that she was supposed to do a stool study and she had threw the sample bottles away and she will be by the office to pick up new ones

## 2012-03-02 NOTE — Telephone Encounter (Signed)
Prescription filled.

## 2012-03-02 NOTE — Telephone Encounter (Signed)
Routing to refill box  

## 2012-03-16 ENCOUNTER — Other Ambulatory Visit: Payer: Self-pay | Admitting: Urgent Care

## 2012-03-16 LAB — ROTAVIRUS ANTIGEN, STOOL
Cryptosporidium Screen (EIA): NEGATIVE
Giardia Screen - EIA: NEGATIVE
Toxigenic C. Difficile by PCR: NEGATIVE

## 2012-03-17 LAB — GIARDIA/CRYPTOSPORIDIUM (EIA): Cryptosporidium Screen (EIA): NEGATIVE

## 2012-03-17 LAB — FECAL LACTOFERRIN, QUANT: Lactoferrin: NEGATIVE

## 2012-03-17 NOTE — Progress Notes (Signed)
Quick Note:  Please call lab to see what is going on. Pt was supposed to have full set of stool studies in June. It looks like she has decided to do them now, however orders had already been cancelled. Please be sure all stool studies are being processed. Thanks  ______

## 2012-03-20 NOTE — Progress Notes (Signed)
Quick Note:  I need results from stool studies please. Thanks ______

## 2012-03-21 NOTE — Progress Notes (Signed)
Quick Note:  C. Difficile PCR, Giardia/Cryptosporidium, and stool culture are negative. Please call patient and make her aware all of her she'll studies are normal. Keep plan as discussed at time of colonoscopy by Dr. Jena Gauss. CC: LUKING,SCOTT, MD  ______

## 2012-03-21 NOTE — Progress Notes (Signed)
Quick Note:  Routing to Julie, RMR pt. ______ 

## 2012-03-24 NOTE — Progress Notes (Signed)
Quick Note:  Please send letter if unable to contact pt. Thanks ______

## 2012-03-24 NOTE — Progress Notes (Signed)
Quick Note:  Mailed letter to pt ______ 

## 2012-04-03 NOTE — Progress Notes (Signed)
Quick Note:  Please call pt. Her stool studies are all normal. Does pt have FU OV? ZO:XWRUEA,VWUJW, MD  ______

## 2012-04-05 ENCOUNTER — Encounter: Payer: Self-pay | Admitting: Internal Medicine

## 2012-04-17 ENCOUNTER — Ambulatory Visit: Payer: BC Managed Care – PPO | Admitting: Urgent Care

## 2012-05-02 ENCOUNTER — Other Ambulatory Visit: Payer: Self-pay | Admitting: Family Medicine

## 2012-05-02 DIAGNOSIS — IMO0001 Reserved for inherently not codable concepts without codable children: Secondary | ICD-10-CM

## 2012-05-08 ENCOUNTER — Ambulatory Visit (HOSPITAL_COMMUNITY)
Admission: RE | Admit: 2012-05-08 | Discharge: 2012-05-08 | Disposition: A | Discharge status: Home / Self Care | Payer: BC Managed Care – PPO | Source: Ambulatory Visit | Attending: Family Medicine | Admitting: Family Medicine

## 2012-05-08 DIAGNOSIS — IMO0001 Reserved for inherently not codable concepts without codable children: Secondary | ICD-10-CM

## 2012-05-08 DIAGNOSIS — Z1231 Encounter for screening mammogram for malignant neoplasm of breast: Secondary | ICD-10-CM | POA: Insufficient documentation

## 2012-08-18 ENCOUNTER — Other Ambulatory Visit: Payer: Self-pay | Admitting: Family Medicine

## 2012-08-18 DIAGNOSIS — M542 Cervicalgia: Secondary | ICD-10-CM

## 2012-08-24 ENCOUNTER — Other Ambulatory Visit: Payer: Self-pay | Admitting: Family Medicine

## 2012-08-24 ENCOUNTER — Ambulatory Visit (HOSPITAL_COMMUNITY)
Admission: RE | Admit: 2012-08-24 | Discharge: 2012-08-24 | Disposition: A | Discharge status: Home / Self Care | Payer: BC Managed Care – PPO | Source: Ambulatory Visit | Attending: Family Medicine | Admitting: Family Medicine

## 2012-08-24 DIAGNOSIS — M542 Cervicalgia: Secondary | ICD-10-CM

## 2012-08-24 DIAGNOSIS — R209 Unspecified disturbances of skin sensation: Secondary | ICD-10-CM | POA: Insufficient documentation

## 2012-08-24 DIAGNOSIS — M5020 Other cervical disc displacement, unspecified cervical region: Secondary | ICD-10-CM | POA: Insufficient documentation

## 2012-08-24 DIAGNOSIS — M25512 Pain in left shoulder: Secondary | ICD-10-CM

## 2012-10-07 HISTORY — PX: NECK SURGERY: SHX720

## 2012-12-05 DIAGNOSIS — Z029 Encounter for administrative examinations, unspecified: Secondary | ICD-10-CM

## 2012-12-11 ENCOUNTER — Telehealth: Payer: Self-pay | Admitting: Family Medicine

## 2012-12-11 NOTE — Telephone Encounter (Signed)
Patient needs something called in for UTI to [facility]. She said that sometimes we will do this for her.

## 2012-12-11 NOTE — Telephone Encounter (Signed)
Cipro 250 mg 1 twice a day for 5 days.called into [facility] pt advised to follow up If any complications or problems.

## 2012-12-11 NOTE — Telephone Encounter (Signed)
Cipro 250 mg 1 twice a day for 5 days. If any complications or problems must followup.

## 2012-12-27 ENCOUNTER — Encounter: Payer: Self-pay | Admitting: Family Medicine

## 2012-12-27 ENCOUNTER — Ambulatory Visit (INDEPENDENT_AMBULATORY_CARE_PROVIDER_SITE_OTHER): Payer: BC Managed Care – PPO | Admitting: Family Medicine

## 2012-12-27 VITALS — BP 116/77 | Temp 99.4°F | Wt 103.8 lb

## 2012-12-27 DIAGNOSIS — J019 Acute sinusitis, unspecified: Secondary | ICD-10-CM

## 2012-12-27 DIAGNOSIS — Z0000 Encounter for general adult medical examination without abnormal findings: Secondary | ICD-10-CM

## 2012-12-27 LAB — POCT URINALYSIS DIPSTICK

## 2012-12-27 MED ORDER — CEFTRIAXONE SODIUM 1 G IJ SOLR
500.0000 mg | Freq: Once | INTRAMUSCULAR | Status: AC
  Administered 2012-12-27: 500 mg via INTRAMUSCULAR

## 2012-12-27 MED ORDER — LEVOFLOXACIN 500 MG PO TABS: 500.0000 mg | ORAL_TABLET | Freq: Every day | ORAL | Status: AC

## 2012-12-27 NOTE — Progress Notes (Signed)
  Subjective:    Patient ID: Isabel Atkinson, female    DOB: May 02, 1966, 47 y.o.   MRN: [id number]  Cough This is a new problem. The current episode started 1 to 4 weeks ago. The problem has been gradually worsening. The problem occurs every few minutes. The cough is productive of sputum. Associated symptoms include a fever, headaches, nasal congestion, postnasal drip and sweats. The symptoms are aggravated by lying down. Risk factors for lung disease include smoking/tobacco exposure. She has tried OTC cough suppressant for the symptoms. The treatment provided no relief.   Started fever yest. With chills/aching   Review of Systems  Constitutional: Positive for fever.  HENT: Positive for postnasal drip.   Respiratory: Positive for cough.   Neurological: Positive for headaches.       Objective:   Physical Exam Eardrums normal throat is normal moderate sinus tenderness neck is supple lungs clear heart regular       Assessment & Plan:  Sinusitis-Levaquin 10 days as directed. May need repeat of medicine if doesn't totally help her. Warning signs were discussed. Quit smiking

## 2012-12-27 NOTE — Patient Instructions (Signed)
If worse call 

## 2013-01-19 ENCOUNTER — Ambulatory Visit (INDEPENDENT_AMBULATORY_CARE_PROVIDER_SITE_OTHER): Payer: BC Managed Care – PPO | Admitting: Nurse Practitioner

## 2013-01-19 ENCOUNTER — Encounter: Payer: Self-pay | Admitting: Nurse Practitioner

## 2013-01-19 VITALS — BP 110/68 | Temp 98.6°F | Wt 104.0 lb

## 2013-01-19 DIAGNOSIS — R300 Dysuria: Secondary | ICD-10-CM

## 2013-01-19 DIAGNOSIS — N390 Urinary tract infection, site not specified: Secondary | ICD-10-CM

## 2013-01-19 LAB — POCT UA - MICROSCOPIC ONLY

## 2013-01-19 LAB — POCT URINALYSIS DIPSTICK: Blood, UA: 50

## 2013-01-19 MED ORDER — CIPROFLOXACIN HCL 250 MG PO TABS: 250.0000 mg | ORAL_TABLET | Freq: Two times a day (BID) | ORAL | Status: DC

## 2013-01-21 LAB — URINE CULTURE

## 2013-01-24 NOTE — Progress Notes (Signed)
Subjective:  Presents for complaints of urinary symptoms over the past 3 days. No fever. Urinary urgency frequency and dysuria. No CVA area tenderness. Mild low back pain. No nausea or vomiting. Was treated for a UTI on 5/14, the symptoms have resolved. Mild suprapubic area discomfort at times. Married, same sexual partner.  Objective:   BP 110/68  Temp(Src) 98.6 F (37 C)  Wt 104 lb (47.174 kg)  BMI 18.43 kg/m2 NAD. Alert, oriented. Lungs clear. Heart regular rate rhythm. No CVA area tenderness. Abdomen soft nondistended with mild suprapubic area tenderness. 0-5 rbc's, rare WBC and rare bacteria per HPF.  Assessment:Dysuria - Plan: POCT UA - Microscopic Only, POCT urinalysis dipstick, Urine culture  UTI (urinary tract infection)  Plan: Meds ordered this encounter  Medications  . omeprazole (PRILOSEC) 20 MG capsule    Sig: Take 20 mg by mouth daily.  . ciprofloxacin (CIPRO) 250 MG tablet    Sig: Take 1 tablet (250 mg total) by mouth 2 (two) times daily.    Dispense:  14 tablet    Refill:  0    Order Specific Question:  Supervising Provider    Answer:  Merlyn Albert [[id number]]   AZO for 48 hours then DC. Increase clear fluid intake. Warning signs reviewed. Urine culture pending. Call back if symptoms worsen or persist.

## 2013-04-23 ENCOUNTER — Telehealth: Payer: Self-pay | Admitting: Family Medicine

## 2013-04-23 MED ORDER — OMEPRAZOLE 20 MG PO CPDR: 20.0000 mg | DELAYED_RELEASE_CAPSULE | Freq: Every day | ORAL | Status: DC

## 2013-04-23 NOTE — Telephone Encounter (Signed)
Rx sent electronically to to [facility]. Patient notified.

## 2013-04-23 NOTE — Telephone Encounter (Signed)
omeprazole (PRILOSEC) 20 MG capsule - [facility]  Please refill this med and thank you

## 2013-04-30 ENCOUNTER — Encounter: Payer: Self-pay | Admitting: Nurse Practitioner

## 2013-04-30 ENCOUNTER — Ambulatory Visit (INDEPENDENT_AMBULATORY_CARE_PROVIDER_SITE_OTHER): Payer: BC Managed Care – PPO | Admitting: Nurse Practitioner

## 2013-04-30 VITALS — BP 110/64 | Temp 98.8°F | Ht 62.0 in | Wt 102.0 lb

## 2013-04-30 DIAGNOSIS — R05 Cough: Secondary | ICD-10-CM

## 2013-04-30 DIAGNOSIS — R059 Cough, unspecified: Secondary | ICD-10-CM

## 2013-04-30 DIAGNOSIS — R300 Dysuria: Secondary | ICD-10-CM

## 2013-04-30 DIAGNOSIS — M25559 Pain in unspecified hip: Secondary | ICD-10-CM

## 2013-04-30 DIAGNOSIS — M25551 Pain in right hip: Secondary | ICD-10-CM

## 2013-04-30 LAB — POCT URINALYSIS DIPSTICK: pH, UA: 5

## 2013-04-30 LAB — POCT URINE PREGNANCY: Preg Test, Ur: NEGATIVE

## 2013-04-30 MED ORDER — ALPRAZOLAM 0.25 MG PO TABS: ORAL_TABLET | ORAL | Status: DC

## 2013-05-02 LAB — URINE CULTURE

## 2013-05-03 ENCOUNTER — Other Ambulatory Visit: Payer: Self-pay | Admitting: Nurse Practitioner

## 2013-05-03 ENCOUNTER — Ambulatory Visit (HOSPITAL_COMMUNITY): Payer: BC Managed Care – PPO

## 2013-05-03 ENCOUNTER — Telehealth: Payer: Self-pay | Admitting: Nurse Practitioner

## 2013-05-03 ENCOUNTER — Ambulatory Visit (HOSPITAL_COMMUNITY)
Admission: RE | Admit: 2013-05-03 | Discharge: 2013-05-03 | Disposition: A | Discharge status: Home / Self Care | Payer: BC Managed Care – PPO | Source: Ambulatory Visit | Attending: Nurse Practitioner | Admitting: Nurse Practitioner

## 2013-05-03 ENCOUNTER — Encounter: Payer: Self-pay | Admitting: Nurse Practitioner

## 2013-05-03 DIAGNOSIS — R059 Cough, unspecified: Secondary | ICD-10-CM | POA: Insufficient documentation

## 2013-05-03 DIAGNOSIS — R05 Cough: Secondary | ICD-10-CM | POA: Insufficient documentation

## 2013-05-03 LAB — POCT UA - MICROSCOPIC ONLY
Bacteria, U Microscopic: 0
RBC, urine, microscopic: 0
WBC, Ur, HPF, POC: 0

## 2013-05-03 MED ORDER — CIPROFLOXACIN HCL 500 MG PO TABS: 500.0000 mg | ORAL_TABLET | Freq: Two times a day (BID) | ORAL | Status: DC

## 2013-05-03 NOTE — Telephone Encounter (Signed)
Patient was supposed to have something called in for a UTI yesterday, but nothing was ever called in.   [facility]

## 2013-05-03 NOTE — Progress Notes (Signed)
Subjective:  Presents with complaints of possible bladder infection for the past 4 days.  mid pelvic pain. No flank pain. Low back pain. Slight burning with urination. Mild frequency and urgency. No vaginal discharge. No fever. No nausea vomiting. Married, same sexual partner. No menstrual cycles for the past several years. No vaginal bleeding. Chronic smoker's cough no change. Is due for a chest x-ray. At end of visit also requesting refill on her Xanax which she uses sparingly.  Objective:   BP 110/64  Temp(Src) 98.8 F (37.1 C) (Oral)  Ht 5\' 2"  (1.575 m)  Wt 102 lb (46.267 kg)  BMI 18.65 kg/m2 NAD. Alert, oriented. Lungs clear. No CVA area tenderness. Heart regular rate rhythm. Abdomen soft nondistended with moderate mid to right pelvic area tenderness. External GU normal. Vagina slightly pale and moist, no discharge noted. No CMT. Mild tenderness noted around midline and right side on bimanual exam, no obvious masses noted. Urine microscopic negative.  Assessment:Dysuria - Plan: POCT urinalysis dipstick, POCT UA - Microscopic Only, Urine culture  Pain in joint, pelvic region and thigh, right - Plan: POCT urine pregnancy, US Pelvis Complete  Cough - Plan: DG Chest 2 View  Plan: Meds ordered this encounter  Medications  . ALPRAZolam (XANAX) 0.25 MG tablet    Sig: 1/2 -1 po BID prn anxiety    Dispense:  30 tablet    Refill:  5    Order Specific Question:  Supervising Provider    Answer:  Merlyn Albert [[id number]]   Urine culture pending. Call back if symptoms worsen or persist.

## 2013-05-12 ENCOUNTER — Encounter: Payer: Self-pay | Admitting: *Deleted

## 2013-05-14 ENCOUNTER — Ambulatory Visit (INDEPENDENT_AMBULATORY_CARE_PROVIDER_SITE_OTHER): Payer: BC Managed Care – PPO | Admitting: Nurse Practitioner

## 2013-05-14 ENCOUNTER — Encounter: Payer: Self-pay | Admitting: Nurse Practitioner

## 2013-05-14 VITALS — BP 122/80 | Ht 62.0 in | Wt 101.4 lb

## 2013-05-14 DIAGNOSIS — M948X9 Other specified disorders of cartilage, unspecified sites: Secondary | ICD-10-CM

## 2013-05-14 DIAGNOSIS — R8290 Unspecified abnormal findings in urine: Secondary | ICD-10-CM

## 2013-05-14 DIAGNOSIS — M898X9 Other specified disorders of bone, unspecified site: Secondary | ICD-10-CM

## 2013-05-14 DIAGNOSIS — M8580 Other specified disorders of bone density and structure, unspecified site: Secondary | ICD-10-CM

## 2013-05-14 DIAGNOSIS — R82998 Other abnormal findings in urine: Secondary | ICD-10-CM

## 2013-05-14 DIAGNOSIS — R300 Dysuria: Secondary | ICD-10-CM

## 2013-05-14 DIAGNOSIS — J069 Acute upper respiratory infection, unspecified: Secondary | ICD-10-CM

## 2013-05-14 LAB — POCT URINALYSIS DIPSTICK: pH, UA: 5

## 2013-05-14 LAB — POCT UA - MICROSCOPIC ONLY
Bacteria, U Microscopic: 0
Casts, Ur, LPF, POC: 0
Mucus, UA: 0
RBC, urine, microscopic: 0
WBC, Ur, HPF, POC: 0

## 2013-05-14 MED ORDER — CEFPROZIL 500 MG PO TABS: 500.0000 mg | ORAL_TABLET | Freq: Two times a day (BID) | ORAL | Status: DC

## 2013-05-15 LAB — URINE CULTURE
Colony Count: NO GROWTH
Organism ID, Bacteria: NO GROWTH

## 2013-05-16 ENCOUNTER — Encounter: Payer: Self-pay | Admitting: Nurse Practitioner

## 2013-05-16 NOTE — Progress Notes (Signed)
Subjective:  Presents complaints of cold symptoms that began 4 days ago. No fever. Occasional cough producing slight yellow green mucus. Runny nose. Facial area headache. No wheezing. No ear pain or sore throat. Also wants to discuss results of her recent chest x-ray. Still having slight pressure with urination but other symptoms improved. Would like to make sure her infection is clear. No increased cough, mucus production or unusual shortness of breath.  Objective:   BP 122/80  Ht 5\' 2"  (1.575 m)  Wt 101 lb 6 oz (45.983 kg)  BMI 18.54 kg/m2 NAD. Alert, oriented. TMs clear effusion, no erythema. Pharynx injected with PND noted. Neck supple with mild soft nontender adenopathy. Lungs were. Heart regular rate rhythm. Chest x-ray dated 9/22 shows changes consistent with COPD and some bone demineralization with scoliosis. Urine microscopic negative.  Assessment: Acute upper respiratory infections of unspecified site  Abnormal urine - Plan: POCT urinalysis dipstick, POCT UA - Microscopic Only, Urine culture  Bone loss - Plan: DG Bone Density  Dysuria - Plan: Urine culture  Plan: Meds ordered this encounter  Medications  . cefPROZIL (CEFZIL) 500 MG tablet    Sig: Take 1 tablet (500 mg total) by mouth 2 (two) times daily.    Dispense:  20 tablet    Refill:  0    Order Specific Question:  Supervising Provider    Answer:  Merlyn Albert [[id number]]   OTC meds as directed for congestion. Discussed importance of smoking cessation. Patient defers further workup for COPD at this time. Recommend daily vitamin D and calcium.

## 2013-05-18 ENCOUNTER — Ambulatory Visit (HOSPITAL_COMMUNITY)
Admission: RE | Admit: 2013-05-18 | Discharge: 2013-05-18 | Disposition: A | Discharge status: Home / Self Care | Payer: BC Managed Care – PPO | Source: Ambulatory Visit | Attending: Nurse Practitioner | Admitting: Nurse Practitioner

## 2013-05-18 DIAGNOSIS — M898X9 Other specified disorders of bone, unspecified site: Secondary | ICD-10-CM

## 2013-05-18 DIAGNOSIS — M810 Age-related osteoporosis without current pathological fracture: Secondary | ICD-10-CM | POA: Insufficient documentation

## 2013-05-18 DIAGNOSIS — M8580 Other specified disorders of bone density and structure, unspecified site: Secondary | ICD-10-CM

## 2013-06-06 ENCOUNTER — Other Ambulatory Visit: Payer: Self-pay | Admitting: *Deleted

## 2013-06-06 MED ORDER — ALENDRONATE SODIUM 70 MG PO TABS: 70.0000 mg | ORAL_TABLET | ORAL | Status: DC

## 2013-06-12 ENCOUNTER — Other Ambulatory Visit: Payer: Self-pay | Admitting: Family Medicine

## 2013-06-12 DIAGNOSIS — Z139 Encounter for screening, unspecified: Secondary | ICD-10-CM

## 2013-06-18 ENCOUNTER — Ambulatory Visit (HOSPITAL_COMMUNITY)
Admission: RE | Admit: 2013-06-18 | Discharge: 2013-06-18 | Disposition: A | Discharge status: Home / Self Care | Payer: BC Managed Care – PPO | Source: Ambulatory Visit | Attending: Family Medicine | Admitting: Family Medicine

## 2013-06-18 DIAGNOSIS — Z139 Encounter for screening, unspecified: Secondary | ICD-10-CM

## 2013-06-18 DIAGNOSIS — Z1231 Encounter for screening mammogram for malignant neoplasm of breast: Secondary | ICD-10-CM | POA: Insufficient documentation

## 2013-07-18 ENCOUNTER — Ambulatory Visit (INDEPENDENT_AMBULATORY_CARE_PROVIDER_SITE_OTHER): Payer: BC Managed Care – PPO | Admitting: Nurse Practitioner

## 2013-07-18 ENCOUNTER — Encounter: Payer: Self-pay | Admitting: Family Medicine

## 2013-07-18 VITALS — BP 128/82 | Temp 99.0°F | Ht 62.0 in | Wt 104.8 lb

## 2013-07-18 DIAGNOSIS — J329 Chronic sinusitis, unspecified: Secondary | ICD-10-CM

## 2013-07-18 DIAGNOSIS — J310 Chronic rhinitis: Secondary | ICD-10-CM

## 2013-07-18 MED ORDER — CEFTRIAXONE SODIUM 1 G IJ SOLR
500.0000 mg | Freq: Once | INTRAMUSCULAR | Status: AC
  Administered 2013-07-18: 500 mg via INTRAMUSCULAR

## 2013-07-19 ENCOUNTER — Encounter: Payer: Self-pay | Admitting: Nurse Practitioner

## 2013-07-19 NOTE — Progress Notes (Signed)
Subjective:  Presents complaints of head congestion and cough for about a week and a half. Took 3 days of Cefzil from her visit in October, slight improvement in her symptoms. Frequent cough still producing green sputum. Facial area headache occasionally radiating into the teeth. No fever. Slight wheezing, has of albuterol inhaler but has not had to use this. No sore throat or ear pain. No vomiting diarrhea or abdominal pain.  Objective:   BP 128/82  Temp(Src) 99 F (37.2 C) (Oral)  Ht 5\' 2"  (1.575 m)  Wt 104 lb 12.8 oz (47.537 kg)  BMI 19.16 kg/m2 NAD. Alert, oriented. TMs clear effusion, no erythema. Pharynx injected with green PND noted. Neck supple with mild soft nontender adenopathy. Lungs clear. Heart regular rate rhythm.  Assessment:Rhinosinusitis - Plan: cefTRIAXone (ROCEPHIN) injection 500 mg  Plan: Tomorrow resume Cefzil as directed. OTC meds as directed for congestion and cough. Call back if worsens or persists. Reminded patient about preventive health physical.

## 2013-07-23 ENCOUNTER — Encounter: Payer: Self-pay | Admitting: Nurse Practitioner

## 2013-07-23 ENCOUNTER — Other Ambulatory Visit: Payer: Self-pay | Admitting: Nurse Practitioner

## 2013-08-22 ENCOUNTER — Telehealth: Payer: Self-pay | Admitting: Family Medicine

## 2013-08-22 NOTE — Telephone Encounter (Signed)
Doesn't surprise me that mediacation list is wrong. Try to verify with the patient Dexilant and clarify why not the other . Then may call in the dexalant(some insur co. Cover this some don't-let pt know that, we will do the best we can)

## 2013-08-22 NOTE — Telephone Encounter (Signed)
According to med list, patient is on omeprazole, dexilant is not listed.

## 2013-08-22 NOTE — Telephone Encounter (Signed)
Please call in [place], pt uses [facility], please call pt when done [phone]

## 2013-08-23 MED ORDER — DEXLANSOPRAZOLE 30 MG PO CPDR: 30.0000 mg | DELAYED_RELEASE_CAPSULE | Freq: Every day | ORAL | Status: DC

## 2013-08-23 NOTE — Telephone Encounter (Signed)
May have 30 day with 6 refills

## 2013-08-23 NOTE — Telephone Encounter (Signed)
Please disregard message below, documented on wrong patient.  Patient states that that omeprazole is not working and she prefers the [facility]. She is aware that this med is more expensive.

## 2013-08-23 NOTE — Telephone Encounter (Signed)
Left message on voicemail to return call.

## 2013-08-23 NOTE — Telephone Encounter (Signed)
Patient notified, med sent.  °

## 2013-08-23 NOTE — Telephone Encounter (Signed)
Patient states that they will be seeing a counselor at Dr. Alan Ripper office on Jan. 22 and they have a follow up one month later.

## 2013-09-04 ENCOUNTER — Encounter: Payer: Self-pay | Admitting: Nurse Practitioner

## 2013-09-04 ENCOUNTER — Encounter: Payer: Self-pay | Admitting: Family Medicine

## 2013-09-04 ENCOUNTER — Ambulatory Visit (INDEPENDENT_AMBULATORY_CARE_PROVIDER_SITE_OTHER): Payer: BC Managed Care – PPO | Admitting: Nurse Practitioner

## 2013-09-04 VITALS — BP 122/70 | Temp 98.9°F | Ht 62.0 in | Wt 105.0 lb

## 2013-09-04 DIAGNOSIS — J0100 Acute maxillary sinusitis, unspecified: Secondary | ICD-10-CM

## 2013-09-04 MED ORDER — CEFTRIAXONE SODIUM 1 G IJ SOLR
500.0000 mg | Freq: Once | INTRAMUSCULAR | Status: AC
  Administered 2013-09-04: 500 mg via INTRAMUSCULAR

## 2013-09-04 MED ORDER — CEFPROZIL 500 MG PO TABS: 500.0000 mg | ORAL_TABLET | Freq: Two times a day (BID) | ORAL | Status: DC

## 2013-09-07 ENCOUNTER — Encounter: Payer: Self-pay | Admitting: Nurse Practitioner

## 2013-09-07 NOTE — Progress Notes (Signed)
Subjective:  Presents with c/o cough and runny nose x 4 d. Nonproductive cough. No fever. Facial area headache radiating to the teeth. No sore throat. Ear pain. No wheezing. Smoker.   Objective:   BP 122/70  Temp(Src) 98.9 F (37.2 C) (Oral)  Ht 5\' 2"  (1.575 m)  Wt 105 lb (47.628 kg)  BMI 19.20 kg/m2 NAD. Alert, oriented. TMs clear effusion. Positive maxillary sinus tenderness. Pharynx injected with PND noted. Neck supple with mild adenopathy. Lungs clear. Heart RRR.  Assessment: Maxillary sinusitis, acute - Plan: cefTRIAXone (ROCEPHIN) injection 500 mg  Plan:  Meds ordered this encounter  Medications  . cefPROZIL (CEFZIL) 500 MG tablet    Sig: Take 1 tablet (500 mg total) by mouth 2 (two) times daily.    Dispense:  20 tablet    Refill:  0    Order Specific Question:  Supervising Provider    Answer:  Mikey Kirschner [[id number]]  . cefTRIAXone (ROCEPHIN) injection 500 mg    Sig:   continue Mucinex as directed for congestion and cough. Call back if worsens or persists.

## 2013-09-19 ENCOUNTER — Telehealth: Payer: Self-pay | Admitting: Internal Medicine

## 2013-09-19 MED ORDER — DEXLANSOPRAZOLE 30 MG PO CPDR: 30.0000 mg | DELAYED_RELEASE_CAPSULE | Freq: Every day | ORAL | Status: DC

## 2013-09-19 NOTE — Telephone Encounter (Signed)
Yes, RF for six months. She will need OV this summer before more refills.

## 2013-09-19 NOTE — Telephone Encounter (Signed)
Isabel Atkinson, please nic ov

## 2013-09-19 NOTE — Telephone Encounter (Signed)
Routing to refill box  

## 2013-09-19 NOTE — Telephone Encounter (Signed)
Pt called to see if RMR would call her in a RF of Dexilant to [facility]. [phone]

## 2013-09-19 NOTE — Telephone Encounter (Signed)
Reminder in epic °

## 2013-10-29 ENCOUNTER — Ambulatory Visit (INDEPENDENT_AMBULATORY_CARE_PROVIDER_SITE_OTHER): Payer: BC Managed Care – PPO | Admitting: Nurse Practitioner

## 2013-10-29 ENCOUNTER — Encounter: Payer: Self-pay | Admitting: Nurse Practitioner

## 2013-10-29 ENCOUNTER — Encounter: Payer: Self-pay | Admitting: Family Medicine

## 2013-10-29 VITALS — BP 112/70 | Temp 99.3°F | Ht 62.0 in | Wt 105.2 lb

## 2013-10-29 DIAGNOSIS — Z79899 Other long term (current) drug therapy: Secondary | ICD-10-CM

## 2013-10-29 DIAGNOSIS — Z0189 Encounter for other specified special examinations: Secondary | ICD-10-CM

## 2013-10-29 DIAGNOSIS — R300 Dysuria: Secondary | ICD-10-CM

## 2013-10-29 DIAGNOSIS — R5381 Other malaise: Secondary | ICD-10-CM

## 2013-10-29 DIAGNOSIS — R5383 Other fatigue: Secondary | ICD-10-CM

## 2013-10-29 LAB — POCT URINALYSIS DIPSTICK
Blood, UA: 10
PH UA: 7
Spec Grav, UA: <=1.005

## 2013-10-29 MED ORDER — CIPROFLOXACIN HCL 500 MG PO TABS: 500.0000 mg | ORAL_TABLET | Freq: Two times a day (BID) | ORAL | Status: DC

## 2013-10-31 LAB — URINE CULTURE: Colony Count: 75000

## 2013-11-01 ENCOUNTER — Encounter: Payer: Self-pay | Admitting: Nurse Practitioner

## 2013-11-01 LAB — POCT UA - MICROSCOPIC ONLY
Bacteria, U Microscopic: 0
Epithelial cells, urine per micros: 0
MUCUS UA: 0
RBC, urine, microscopic: 0
WBC, UR, HPF, POC: 0

## 2013-11-01 NOTE — Progress Notes (Signed)
Subjective:  Presents complaints of urinary symptoms over the past week. Urgency. Frequency. Dysuria off and on. No hematuria. No vaginal discharge. Married, same sexual partner. No pelvic pain. No back pain. No history of recent UTI. Low-grade fever. No nausea or vomiting. Bowels normal limit. Complaints of extreme fatigue that has worsened over time. Has been under tremendous stress since her daughter and 2 grandchildren have moved into her home.  Objective:   BP 112/70  Temp(Src) 99.3 F (37.4 C) (Oral)  Ht 5\' 2"  (1.575 m)  Wt 105 lb 4 oz (47.741 kg)  BMI 19.25 kg/m2 NAD. Alert, oriented. Fatigued in appearance. Thyroid no goiters or masses noted, nontender to palpation. Lungs clear. Heart regular rate rhythm. No CVA or flank tenderness. Abdomen soft nondistended nontender. UA trace blood and leukocytes. Urine microscopic negative but note urine is extremely dilute.    Assessment: Dysuria - Plan: POCT urinalysis dipstick, Urine culture  Other specified examination - Plan: Lipid panel  Encounter for long-term (current) use of other medications - Plan: Hepatic function panel, Basic metabolic panel  Other malaise and fatigue - Plan: CBC with Differential, TSH, Vit D  25 hydroxy (rtn osteoporosis monitoring)  Plan: Meds ordered this encounter  Medications  . ciprofloxacin (CIPRO) 500 MG tablet    Sig: Take 1 tablet (500 mg total) by mouth 2 (two) times daily.    Dispense:  14 tablet    Refill:  0    Order Specific Question:  Supervising Provider    Answer:  Mikey Kirschner [[id number]]   Urine culture pending. Discussed importance of stress reduction. Further followup based on lab work.

## 2013-11-03 LAB — CBC WITH DIFFERENTIAL/PLATELET
BASOS ABS: 0 10*3/uL (ref 0.0–0.1)
BASOS PCT: 0 % (ref 0–1)
Eosinophils Absolute: 0.3 10*3/uL (ref 0.0–0.7)
Eosinophils Relative: 5 % (ref 0–5)
HCT: 37.7 % (ref 36.0–46.0)
Hemoglobin: 12.7 g/dL (ref 12.0–15.0)
Lymphocytes Relative: 36 % (ref 12–46)
Lymphs Abs: 2.1 10*3/uL (ref 0.7–4.0)
MCH: 29.1 pg (ref 26.0–34.0)
MCHC: 33.7 g/dL (ref 30.0–36.0)
MCV: 86.3 fL (ref 78.0–100.0)
MONO ABS: 0.5 10*3/uL (ref 0.1–1.0)
Monocytes Relative: 8 % (ref 3–12)
NEUTROS ABS: 3 10*3/uL (ref 1.7–7.7)
NEUTROS PCT: 51 % (ref 43–77)
Platelets: 252 10*3/uL (ref 150–400)
RBC: 4.37 MIL/uL (ref 3.87–5.11)
RDW: 13.4 % (ref 11.5–15.5)
WBC: 5.8 10*3/uL (ref 4.0–10.5)

## 2013-11-03 LAB — BASIC METABOLIC PANEL
BUN: 12 mg/dL (ref 6–23)
CALCIUM: 8.9 mg/dL (ref 8.4–10.5)
CO2: 28 meq/L (ref 19–32)
CREATININE: 0.55 mg/dL (ref 0.50–1.10)
Chloride: 107 mEq/L (ref 96–112)
Glucose, Bld: 82 mg/dL (ref 70–99)
Potassium: 4.2 mEq/L (ref 3.5–5.3)
Sodium: 140 mEq/L (ref 135–145)

## 2013-11-03 LAB — LIPID PANEL
Cholesterol: 151 mg/dL (ref 0–200)
HDL: 60 mg/dL (ref >39)
LDL Cholesterol: 78 mg/dL (ref 0–99)
Total CHOL/HDL Ratio: 2.5 Ratio
Triglycerides: 64 mg/dL (ref <150)
VLDL: 13 mg/dL (ref 0–40)

## 2013-11-03 LAB — HEPATIC FUNCTION PANEL
ALK PHOS: 62 U/L (ref 39–117)
ALT: 11 U/L (ref 0–35)
AST: 15 U/L (ref 0–37)
Albumin: 4.2 g/dL (ref 3.5–5.2)
BILIRUBIN DIRECT: 0.1 mg/dL (ref 0.0–0.3)
BILIRUBIN TOTAL: 0.4 mg/dL (ref 0.2–1.2)
Indirect Bilirubin: 0.3 mg/dL (ref 0.2–1.2)
Total Protein: 6.1 g/dL (ref 6.0–8.3)

## 2013-11-03 LAB — TSH: TSH: 0.924 u[IU]/mL (ref 0.350–4.500)

## 2013-11-05 ENCOUNTER — Telehealth: Payer: Self-pay | Admitting: *Deleted

## 2013-11-05 ENCOUNTER — Encounter: Payer: Self-pay | Admitting: Nurse Practitioner

## 2013-11-05 LAB — VITAMIN D 25 HYDROXY (VIT D DEFICIENCY, FRACTURES): VIT D 25 HYDROXY: 25 ng/mL — AB (ref 30–89)

## 2013-11-05 MED ORDER — AMPICILLIN 500 MG PO CAPS: 500.0000 mg | ORAL_CAPSULE | Freq: Three times a day (TID) | ORAL | Status: AC

## 2013-11-05 NOTE — Telephone Encounter (Signed)
Patient calling to get test results on her urine culture. Isabel Atkinson has not sign off on the results yet. She would like to know if she needs to start her antibiotics or not according to the urine culture.

## 2013-11-05 NOTE — Telephone Encounter (Signed)
Ampicillin 500 tid 7 d

## 2013-11-05 NOTE — Telephone Encounter (Signed)
Notified patient via VM stating we sent in the new med. 

## 2013-11-05 NOTE — Addendum Note (Signed)
Addended byCharolotte Capuchin D on: 11/05/2013 05:10 PM   Modules accepted: Orders

## 2013-12-24 ENCOUNTER — Encounter: Payer: Self-pay | Admitting: Internal Medicine

## 2014-01-10 ENCOUNTER — Other Ambulatory Visit: Payer: Self-pay | Admitting: Nurse Practitioner

## 2014-02-22 ENCOUNTER — Telehealth: Payer: Self-pay | Admitting: Family Medicine

## 2014-02-22 ENCOUNTER — Other Ambulatory Visit: Payer: Self-pay | Admitting: Nurse Practitioner

## 2014-02-22 NOTE — Telephone Encounter (Signed)
Nurses please verify; we did 5 extra refills in June

## 2014-02-22 NOTE — Telephone Encounter (Signed)
Patient needs refill on xanax 0.25 mg called into [facility]

## 2014-02-25 NOTE — Telephone Encounter (Signed)
Patient notified and verbalized understanding. [facility] has her RX waiting. Pt never picked her RX up.

## 2014-04-10 ENCOUNTER — Telehealth: Payer: Self-pay | Admitting: Family Medicine

## 2014-04-10 MED ORDER — CLONAZEPAM 0.5 MG PO TABS: ORAL_TABLET | ORAL | Status: DC

## 2014-04-10 NOTE — Telephone Encounter (Signed)
I would recommend Klonopin 0.5 mg take 1 hour before procedure. #8. Cautioned drowsiness someone else should drive

## 2014-04-10 NOTE — Telephone Encounter (Signed)
Pt having some extensive dental work done, pretty nervous about it, Dr. Valetta Mole (her dentist) is asking if you will write her something to take the edge off.  Maybe a few lose dose valium to take before procedure.  Next appt is 04/16/14 with him for dental work.  [facility]  Please call when done or if decision is not to write rx

## 2014-04-10 NOTE — Telephone Encounter (Signed)
Discussed with patient. Med faxed to [place] [facility].

## 2014-04-10 NOTE — Addendum Note (Signed)
Addended by: Carmelina Noun on: 04/10/2014 05:09 PM   Modules accepted: Orders

## 2014-04-22 ENCOUNTER — Encounter: Payer: Self-pay | Admitting: Family Medicine

## 2014-04-22 ENCOUNTER — Ambulatory Visit (INDEPENDENT_AMBULATORY_CARE_PROVIDER_SITE_OTHER): Payer: BC Managed Care – PPO | Admitting: Family Medicine

## 2014-04-22 VITALS — BP 110/68 | Temp 98.9°F | Ht 62.0 in | Wt 103.0 lb

## 2014-04-22 DIAGNOSIS — J019 Acute sinusitis, unspecified: Secondary | ICD-10-CM

## 2014-04-22 MED ORDER — LEVOFLOXACIN 500 MG PO TABS: 500.0000 mg | ORAL_TABLET | Freq: Every day | ORAL | Status: DC

## 2014-04-22 MED ORDER — HYDROCODONE-HOMATROPINE 5-1.5 MG/5ML PO SYRP: ORAL_SOLUTION | ORAL | Status: DC

## 2014-04-22 NOTE — Progress Notes (Signed)
   Subjective:    Patient ID: Isabel Atkinson, female    DOB: 1966/04/06, 48 y.o.   MRN: [id number]  Cough Associated symptoms include a fever and headaches. Associated symptoms comments: Patient states she also has some nausea.. She has tried OTC cough suppressant for the symptoms. The treatment provided no relief.  Headache  Associated symptoms include coughing and a fever.  Fever  Associated symptoms include coughing and headaches.  Patient states she also has nausea.    Review of Systems  Constitutional: Positive for fever.  Respiratory: Positive for cough.   Neurological: Positive for headaches.       Objective:   Physical Exam  Nursing note and vitals reviewed. Constitutional: She appears well-developed.  HENT:  Head: Normocephalic.  Nose: Nose normal.  Mouth/Throat: Oropharynx is clear and moist. No oropharyngeal exudate.  Neck: Neck supple.  Cardiovascular: Normal rate and normal heart sounds.   No murmur heard. Pulmonary/Chest: Effort normal and breath sounds normal. She has no wheezes.  Lymphadenopathy:    She has no cervical adenopathy.  Skin: Skin is warm and dry.   Patient not respiratory distress not toxic       Assessment & Plan:  Upper rest revealed [place] for probable sinusitis possible early bronchitis patient was encouraged to quit smoking antibiotics prescribed cough medication prescribed, no need for prednisone currently. Warning signs discussed followup of problems

## 2014-04-29 ENCOUNTER — Encounter: Payer: Self-pay | Admitting: Nurse Practitioner

## 2014-04-29 ENCOUNTER — Ambulatory Visit (INDEPENDENT_AMBULATORY_CARE_PROVIDER_SITE_OTHER): Payer: BC Managed Care – PPO | Admitting: Nurse Practitioner

## 2014-04-29 ENCOUNTER — Encounter: Payer: Self-pay | Admitting: Family Medicine

## 2014-04-29 VITALS — BP 108/68 | Temp 98.9°F | Ht 62.0 in | Wt 103.0 lb

## 2014-04-29 DIAGNOSIS — J069 Acute upper respiratory infection, unspecified: Secondary | ICD-10-CM

## 2014-04-29 DIAGNOSIS — J209 Acute bronchitis, unspecified: Secondary | ICD-10-CM

## 2014-04-29 MED ORDER — CEFTRIAXONE SODIUM 1 G IJ SOLR
500.0000 mg | Freq: Once | INTRAMUSCULAR | Status: AC
  Administered 2014-04-29: 500 mg via INTRAMUSCULAR

## 2014-04-29 MED ORDER — PREDNISONE 20 MG PO TABS: ORAL_TABLET | ORAL | Status: DC

## 2014-04-29 MED ORDER — LEVOFLOXACIN 500 MG PO TABS: 500.0000 mg | ORAL_TABLET | Freq: Every day | ORAL | Status: DC

## 2014-05-01 ENCOUNTER — Encounter: Payer: Self-pay | Admitting: Nurse Practitioner

## 2014-05-01 NOTE — Progress Notes (Signed)
Subjective:  Presents for recheck. Was seen for a sinus infection on 9/14. Placed on Levaquin. Has about 4 days left. Cough has worsened, very frequent. Mostly nonproductive. Light green nasal drainage. Slight dizziness. Low-grade fever. Facial area headache. No sore throat or ear pain. No vomiting diarrhea or abdominal pain. Taking fluids well. Voiding normal limit.  Objective:   BP 108/68  Temp(Src) 98.9 F (37.2 C) (Oral)  Ht 5\' 2"  (1.575 m)  Wt 103 lb (46.72 kg)  BMI 18.83 kg/m2 NAD. Alert, oriented. TMs clear effusion, no erythema. Pharynx erythematous with green PND noted. Neck supple with mild soft anterior adenopathy. Lungs occasional scattered expiratory crackles, no wheezing or tachypnea. Harsh bronchitic cough noted. Heart regular rate rhythm.  Assessment: Acute upper respiratory infections of unspecified site - Plan: cefTRIAXone (ROCEPHIN) injection 500 mg  Acute bronchitis, unspecified organism - Plan: cefTRIAXone (ROCEPHIN) injection 500 mg  Plan:  Meds ordered this encounter  Medications  . predniSONE (DELTASONE) 20 MG tablet    Sig: 2 po qd x 5 d    Dispense:  10 tablet    Refill:  0    Order Specific Question:  Supervising Provider    Answer:  Mikey Kirschner [[id number]]  . levofloxacin (LEVAQUIN) 500 MG tablet    Sig: Take 1 tablet (500 mg total) by mouth daily.    Dispense:  10 tablet    Refill:  0    Order Specific Question:  Supervising Provider    Answer:  Mikey Kirschner [[id number]]  . cefTRIAXone (ROCEPHIN) injection 500 mg    Sig:     Order Specific Question:  Antibiotic Indication:    Answer:  Other Indication (list below)    Order Specific Question:  Other Indication:    Answer:  Samuel [place]   has not taken Levaquin today. Rocephin 500 mg now, restart Levaquin tomorrow. Prednisone as directed. OTC meds as directed for congestion and cough. Given another prescription for Levaquin, start this if there is still color to her mucus in 4 days. Warning signs reviewed. Call  back by the end of the week if no improvement, call or go to ED sooner if worse.

## 2014-06-04 ENCOUNTER — Encounter: Payer: Self-pay | Admitting: Nurse Practitioner

## 2014-06-04 ENCOUNTER — Encounter: Payer: Self-pay | Admitting: Family Medicine

## 2014-06-04 ENCOUNTER — Ambulatory Visit (INDEPENDENT_AMBULATORY_CARE_PROVIDER_SITE_OTHER): Payer: BC Managed Care – PPO | Admitting: Nurse Practitioner

## 2014-06-04 VITALS — BP 122/72 | Temp 99.1°F | Ht 62.0 in | Wt 105.0 lb

## 2014-06-04 DIAGNOSIS — J329 Chronic sinusitis, unspecified: Secondary | ICD-10-CM

## 2014-06-04 DIAGNOSIS — J310 Chronic rhinitis: Secondary | ICD-10-CM

## 2014-06-04 MED ORDER — HYDROCODONE-ACETAMINOPHEN 5-325 MG PO TABS: 1.0000 | ORAL_TABLET | ORAL | Status: DC | PRN

## 2014-06-04 MED ORDER — MONTELUKAST SODIUM 10 MG PO TABS: 10.0000 mg | ORAL_TABLET | Freq: Every day | ORAL | Status: DC

## 2014-06-04 MED ORDER — CEFPROZIL 500 MG PO TABS: 500.0000 mg | ORAL_TABLET | Freq: Two times a day (BID) | ORAL | Status: DC

## 2014-06-04 NOTE — Progress Notes (Signed)
   Subjective:    Patient ID: Isabel Atkinson, female    DOB: 1966-02-22, 48 y.o.   MRN: [id number]  Sinusitis This is a recurrent problem. The problem has been waxing and waning (Since end of September ) since onset. Maximum temperature: low grade  Associated symptoms include chills, congestion, coughing, diaphoresis, headaches, a hoarse voice, sinus pressure and a sore throat. Past treatments include acetaminophen (ibu). The treatment provided mild relief.   presents complaints of sinus symptoms over the past month. Slightly better at times. Now having facial area headache. Low-grade fever. Hoarseness, no difficulty swallowing. Slight color to her mucus. Occasionally takes Claritin and Robitussin-DM. No wheezing. Minimal cough. Ear pressure. No sore throat.    Review of Systems  Constitutional: Positive for chills and diaphoresis.  HENT: Positive for congestion, hoarse voice, sinus pressure and sore throat.   Respiratory: Positive for cough.   Neurological: Positive for headaches.       Objective:   Physical Exam NAD. Alert, oriented. Was clear effusion, no erythema. Pharynx injected with yellow PND noted. Neck supple with mild soft anterior adenopathy. Lungs clear. Heart regular rate and rhythm.       Assessment & Plan:  Rhinosinusitis  Meds ordered this encounter  Medications  . montelukast (SINGULAIR) 10 MG tablet    Sig: Take 1 tablet (10 mg total) by mouth at bedtime.    Dispense:  30 tablet    Refill:  2    Order Specific Question:  Supervising Provider    Answer:  Mikey Kirschner [[id number]]  . cefPROZIL (CEFZIL) 500 MG tablet    Sig: Take 1 tablet (500 mg total) by mouth 2 (two) times daily.    Dispense:  20 tablet    Refill:  0    Order Specific Question:  Supervising Provider    Answer:  Mikey Kirschner [[id number]]  . HYDROcodone-acetaminophen (NORCO/VICODIN) 5-325 MG per tablet    Sig: Take 1 tablet by mouth every 4 (four) hours as needed.    Dispense:  30 tablet      Refill:  0    Order Specific Question:  Supervising Provider    Answer:  Mikey Kirschner [[id number]]   Start Claritin and Nasacort AQ daily. Add Singulair to her regimen. Call back in 10-14 days if not significantly improved, sooner if worse. Discussed importance of smoking cessation.

## 2014-06-04 NOTE — Patient Instructions (Signed)
claritin 10 mg once daily nasacort AQ as directed

## 2014-06-07 ENCOUNTER — Encounter: Payer: Self-pay | Admitting: Nurse Practitioner

## 2014-07-01 ENCOUNTER — Encounter: Payer: Self-pay | Admitting: Nurse Practitioner

## 2014-07-01 ENCOUNTER — Ambulatory Visit (INDEPENDENT_AMBULATORY_CARE_PROVIDER_SITE_OTHER): Payer: BC Managed Care – PPO | Admitting: Nurse Practitioner

## 2014-07-01 VITALS — BP 138/86 | Temp 99.1°F | Ht 62.0 in

## 2014-07-01 DIAGNOSIS — K219 Gastro-esophageal reflux disease without esophagitis: Secondary | ICD-10-CM

## 2014-07-01 DIAGNOSIS — R1030 Lower abdominal pain, unspecified: Secondary | ICD-10-CM

## 2014-07-01 DIAGNOSIS — K589 Irritable bowel syndrome without diarrhea: Secondary | ICD-10-CM

## 2014-07-01 DIAGNOSIS — R079 Chest pain, unspecified: Secondary | ICD-10-CM

## 2014-07-01 LAB — POCT URINALYSIS DIPSTICK
Spec Grav, UA: 1.025
pH, UA: 7

## 2014-07-01 LAB — POCT UA - MICROSCOPIC ONLY
Bacteria, U Microscopic: 0
RBC, urine, microscopic: 0
WBC, UR, HPF, POC: 0

## 2014-07-01 NOTE — Patient Instructions (Signed)
activia yogurt 2 cups per day Align as directed

## 2014-07-05 ENCOUNTER — Encounter: Payer: Self-pay | Admitting: Nurse Practitioner

## 2014-07-05 DIAGNOSIS — K589 Irritable bowel syndrome without diarrhea: Secondary | ICD-10-CM | POA: Insufficient documentation

## 2014-07-05 NOTE — Progress Notes (Signed)
Subjective:  Presents for complaints of an exacerbation of her anxiety. Began about 3 weeks ago. Has been under increased stress. 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 Also having localized mid chest pain for the same amount of time. Unassociated with activity. Does not occur every day. No shortness of breath. Last about 30 minutes. Also having occasional reflux, restarted her dexilant last week. Describes discomfort as a "flutter" and a dull ache. No pain with movement or pressure on the chest. Seems to be better if she takes low-dose aspirin. Has not identified any specific triggers. No known pattern. No unusual cough. No blood in her mucus. No fevers. Smoker. Has been seen by the GI specialist in the past.  Objective:   BP 138/86 mmHg  Temp(Src) 99.1 F (37.3 C)  Ht 5\' 2"  (1.575 m) NAD. Alert, oriented. Mildly fatigued in appearance. Lungs clear. Heart regular rate rhythm. No chest pain with palpation of the chest wall. ECG normal. No murmur or gallop noted. Abdomen soft nondistended with active bowel sounds 4; moderate epigastric area tenderness. Minimal lower abdominal tenderness. No rebound or guarding. No obvious masses. Urine micro-negative. Last chest x-ray 05/03/2013.  Assessment:  Problem List Items Addressed This Visit      Digestive   GERD (gastroesophageal reflux disease) - Primary   Irritable bowel syndrome    Other Visit Diagnoses    Lower abdominal pain        Relevant Orders       POCT urinalysis dipstick (Completed)       POCT UA - Microscopic Only (Completed)    Chest pain, unspecified chest pain type noncardiac most likely related to stress          Plan: Continue dexilant as directed. Start bowel probiotics. Discussed importance of stress reduction. 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 Warning signs reviewed. Discussed smoking cessation. Recheck  if symptoms worsen or persist.

## 2014-07-17 ENCOUNTER — Encounter: Payer: Self-pay | Admitting: Family Medicine

## 2014-07-17 ENCOUNTER — Ambulatory Visit (INDEPENDENT_AMBULATORY_CARE_PROVIDER_SITE_OTHER): Payer: BC Managed Care – PPO | Admitting: Nurse Practitioner

## 2014-07-17 VITALS — BP 120/80 | Temp 98.1°F | Ht 62.0 in | Wt 99.4 lb

## 2014-07-17 DIAGNOSIS — M810 Age-related osteoporosis without current pathological fracture: Secondary | ICD-10-CM

## 2014-07-17 DIAGNOSIS — B349 Viral infection, unspecified: Secondary | ICD-10-CM

## 2014-07-17 DIAGNOSIS — J029 Acute pharyngitis, unspecified: Secondary | ICD-10-CM

## 2014-07-17 LAB — POCT RAPID STREP A (OFFICE): Rapid Strep A Screen: NEGATIVE

## 2014-07-17 NOTE — Progress Notes (Signed)
   Subjective:    Patient ID: Isabel Atkinson, female    DOB: June 11, 1966, 48 y.o.   MRN: [id number]  Sinusitis This is a new problem. The current episode started yesterday. The problem is unchanged. There has been no fever. The pain is moderate. Associated symptoms include chills, congestion, coughing, headaches and sinus pressure. (Muscle aches) Past treatments include acetaminophen (Robitussin DM). The treatment provided no relief.   Patient states that she has no other concerns.    Review of Systems  Constitutional: Positive for chills.  HENT: Positive for congestion and sinus pressure.   Respiratory: Positive for cough.   Neurological: Positive for headaches.  sore throat. No ear pain. Malaise. No vomiting or diarrhea. Minimal cough. No wheezing.  Note: unable to take Fosamax due to stomach upset.      Objective:   Physical Exam  NAD. Alert, oriented. TMs clear effusion, no erythema. Pharynx mild erythema. RST neg. Neck supple with mild anterior adenopathy. Lungs clear. Heart RRR.       Assessment & Plan:  Acute pharyngitis, unspecified pharyngitis type - Plan: POCT rapid strep A, Strep A DNA probe  Viral illness  OTC meds as directed for congestion. Call back in 48 hours if no improvement, sooner if worse.

## 2014-07-18 ENCOUNTER — Other Ambulatory Visit: Payer: Self-pay | Admitting: Family Medicine

## 2014-07-18 DIAGNOSIS — Z1231 Encounter for screening mammogram for malignant neoplasm of breast: Secondary | ICD-10-CM

## 2014-07-18 LAB — STREP A DNA PROBE: GASP: NEGATIVE

## 2014-07-18 NOTE — Progress Notes (Signed)
Patient notified and verbalized understanding of the test results. No further questions. 

## 2014-07-19 ENCOUNTER — Telehealth: Payer: Self-pay | Admitting: Nurse Practitioner

## 2014-07-19 ENCOUNTER — Other Ambulatory Visit: Payer: Self-pay | Admitting: Nurse Practitioner

## 2014-07-19 MED ORDER — CEFPROZIL 500 MG PO TABS: 500.0000 mg | ORAL_TABLET | Freq: Two times a day (BID) | ORAL | Status: DC

## 2014-07-19 NOTE — Telephone Encounter (Signed)
Will call in antibiotic. Call back Monday if no better.

## 2014-07-19 NOTE — Telephone Encounter (Signed)
This med is generic but may still be expensive depending on insurance. She has gotten this before.

## 2014-07-19 NOTE — Telephone Encounter (Signed)
Patient was seen on Wednesday.  She said she was supposed to call back today if she was no better and [name] would call in antibiotic.  She said she would like one called in to [place].

## 2014-07-19 NOTE — Telephone Encounter (Signed)
Patient notified and verbalized understanding. 

## 2014-07-20 ENCOUNTER — Encounter: Payer: Self-pay | Admitting: Nurse Practitioner

## 2014-07-20 DIAGNOSIS — M810 Age-related osteoporosis without current pathological fracture: Secondary | ICD-10-CM | POA: Insufficient documentation

## 2014-07-23 ENCOUNTER — Other Ambulatory Visit: Payer: Self-pay | Admitting: Nurse Practitioner

## 2014-07-23 DIAGNOSIS — M810 Age-related osteoporosis without current pathological fracture: Secondary | ICD-10-CM

## 2014-07-29 ENCOUNTER — Ambulatory Visit (HOSPITAL_COMMUNITY)
Admission: RE | Admit: 2014-07-29 | Discharge: 2014-07-29 | Disposition: A | Discharge status: Home / Self Care | Payer: BC Managed Care – PPO | Source: Ambulatory Visit | Attending: Family Medicine | Admitting: Family Medicine

## 2014-07-29 DIAGNOSIS — Z1231 Encounter for screening mammogram for malignant neoplasm of breast: Secondary | ICD-10-CM | POA: Insufficient documentation

## 2014-08-07 ENCOUNTER — Telehealth: Payer: Self-pay

## 2014-08-07 ENCOUNTER — Ambulatory Visit (INDEPENDENT_AMBULATORY_CARE_PROVIDER_SITE_OTHER): Payer: BC Managed Care – PPO | Admitting: Family Medicine

## 2014-08-07 VITALS — BP 114/76 | Temp 99.4°F | Ht 62.0 in | Wt 103.0 lb

## 2014-08-07 DIAGNOSIS — J0100 Acute maxillary sinusitis, unspecified: Secondary | ICD-10-CM

## 2014-08-07 DIAGNOSIS — J019 Acute sinusitis, unspecified: Secondary | ICD-10-CM

## 2014-08-07 MED ORDER — HYDROCODONE-HOMATROPINE 5-1.5 MG/5ML PO SYRP: 5.0000 mL | ORAL_SOLUTION | Freq: Four times a day (QID) | ORAL | Status: DC | PRN

## 2014-08-07 MED ORDER — LEVOFLOXACIN 500 MG PO TABS: 500.0000 mg | ORAL_TABLET | Freq: Every day | ORAL | Status: DC

## 2014-08-07 NOTE — Progress Notes (Signed)
   Subjective:    Patient ID: Isabel Atkinson, female    DOB: 1965-11-28, 48 y.o.   MRN: [id number]  Cough This is a new problem. The current episode started in the past 7 days. Associated symptoms include headaches and myalgias. Associated symptoms comments: Runny nose. Treatments tried: tylenol cold. The treatment provided no relief.    PMH tobacco misuse  Review of Systems  Respiratory: Positive for cough.   Musculoskeletal: Positive for myalgias.  Neurological: Positive for headaches.       Objective:   Physical Exam Sinus pressure pain and tenderness cough noted heart regular not respiratory distress   no vomiting no diarrhea       Assessment & Plan:  Acute rhinosinusitis Acute bronchitis Smoking abuse. Warning signs discuss Antibiotics prescribed

## 2014-08-07 NOTE — Telephone Encounter (Signed)
error 

## 2014-08-12 ENCOUNTER — Encounter (HOSPITAL_COMMUNITY)
Admission: RE | Admit: 2014-08-12 | Discharge: 2014-08-12 | Disposition: A | Discharge status: Home / Self Care | Payer: BC Managed Care – PPO | Source: Ambulatory Visit | Attending: Family Medicine | Admitting: Family Medicine

## 2014-08-12 DIAGNOSIS — M810 Age-related osteoporosis without current pathological fracture: Secondary | ICD-10-CM | POA: Insufficient documentation

## 2014-08-12 LAB — RENAL FUNCTION PANEL
ALBUMIN: 4.7 g/dL (ref 3.5–5.2)
Anion gap: 8 (ref 5–15)
BUN: 19 mg/dL (ref 6–23)
CALCIUM: 9.7 mg/dL (ref 8.4–10.5)
CO2: 26 mmol/L (ref 19–32)
CREATININE: 0.51 mg/dL (ref 0.50–1.10)
Chloride: 106 mEq/L (ref 96–112)
GFR calc Af Amer: >90 mL/min (ref >90)
GFR calc non Af Amer: >90 mL/min (ref >90)
GLUCOSE: 114 mg/dL — AB (ref 70–99)
PHOSPHORUS: 3.6 mg/dL (ref 2.3–4.6)
Potassium: 3.6 mmol/L (ref 3.5–5.1)
Sodium: 140 mmol/L (ref 135–145)

## 2014-08-12 MED ORDER — IBANDRONATE SODIUM 3 MG/3ML IV SOLN
3.0000 mg | Freq: Once | INTRAVENOUS | Status: AC
  Administered 2014-08-12: 3 mg via INTRAVENOUS
  Filled 2014-08-12: qty 3

## 2014-08-12 NOTE — Progress Notes (Signed)
Results for Isabel Atkinson, Isabel Atkinson (MRN [id number]) as of 08/12/2014 15:27  Ref. Range 08/12/2014 14:20  Sodium Latest Range: 135-145 mmol/L 140  Potassium Latest Range: 3.5-5.1 mmol/L 3.6  Chloride Latest Range: 96-112 mEq/L 106  CO2 Latest Range: 19-32 mmol/L 26  BUN Latest Range: 6-23 mg/dL 19  Creatinine Latest Range: 0.50-1.10 mg/dL 0.51  Calcium Latest Range: 8.4-10.5 mg/dL 9.7  GFR calc non Af Amer Latest Range: >90 mL/min >90  GFR calc Af Amer Latest Range: >90 mL/min >90  Glucose Latest Range: 70-99 mg/dL 114 (H)  Anion gap Latest Range: 5-15  8  Phosphorus Latest Range: 2.3-4.6 mg/dL 3.6  Albumin No range found 4.7

## 2014-08-14 ENCOUNTER — Ambulatory Visit (INDEPENDENT_AMBULATORY_CARE_PROVIDER_SITE_OTHER): Payer: BC Managed Care – PPO | Admitting: Family Medicine

## 2014-08-14 ENCOUNTER — Encounter: Payer: Self-pay | Admitting: Family Medicine

## 2014-08-14 VITALS — BP 112/80 | Temp 99.0°F | Ht 62.0 in | Wt 105.1 lb

## 2014-08-14 DIAGNOSIS — R05 Cough: Secondary | ICD-10-CM

## 2014-08-14 DIAGNOSIS — B9689 Other specified bacterial agents as the cause of diseases classified elsewhere: Secondary | ICD-10-CM

## 2014-08-14 DIAGNOSIS — J019 Acute sinusitis, unspecified: Secondary | ICD-10-CM

## 2014-08-14 DIAGNOSIS — R059 Cough, unspecified: Secondary | ICD-10-CM

## 2014-08-14 MED ORDER — CEFTRIAXONE SODIUM 1 G IJ SOLR
500.0000 mg | Freq: Once | INTRAMUSCULAR | Status: AC
  Administered 2014-08-14: 500 mg via INTRAMUSCULAR

## 2014-08-14 MED ORDER — VALACYCLOVIR HCL 1 G PO TABS: ORAL_TABLET | ORAL | Status: DC

## 2014-08-14 MED ORDER — CEFPROZIL 500 MG PO TABS: 500.0000 mg | ORAL_TABLET | Freq: Two times a day (BID) | ORAL | Status: DC

## 2014-08-14 NOTE — Progress Notes (Signed)
   Subjective:    Patient ID: Isabel Atkinson, female    DOB: 03-19-1966, 49 y.o.   MRN: [id number]  Cough This is a new problem. The current episode started 1 to 4 weeks ago. The problem has been gradually worsening. The problem occurs constantly. The cough is productive of sputum. Associated symptoms include headaches. Associated symptoms comments: fatigue. Treatments tried: Levaquin. The treatment provided no relief.   Patient has no other concerns at this time.  Patient also has a sore around her nose that comes whenever she gets illness PMH benign. Please see previous note. Review of Systems  Respiratory: Positive for cough.   Neurological: Positive for headaches.       Objective:   Physical Exam  Lungs are clear bronchial cough noted mild sinus tenderness eardrums normal throat is normal neck is supple patient not toxic The sore looks like it could well be cold sore     Assessment & Plan:  Acute rhinosinusitis bacterial not totally better with current medication we'll do shot of antibiotics along with switch in antibiotics. Follow-up if progressive troubles Valtrex as directed when necessary for cold sore Patient counseled to quit smoking. If worse follow-up I don't feel that patient needs x-rays or lab work currently.

## 2014-09-19 ENCOUNTER — Other Ambulatory Visit: Payer: Self-pay | Admitting: Nurse Practitioner

## 2014-10-04 ENCOUNTER — Encounter: Payer: Self-pay | Admitting: Nurse Practitioner

## 2014-10-04 ENCOUNTER — Ambulatory Visit (INDEPENDENT_AMBULATORY_CARE_PROVIDER_SITE_OTHER): Payer: BC Managed Care – PPO | Admitting: Nurse Practitioner

## 2014-10-04 VITALS — BP 120/78 | Temp 99.0°F | Ht 62.0 in | Wt 104.8 lb

## 2014-10-04 DIAGNOSIS — R300 Dysuria: Secondary | ICD-10-CM

## 2014-10-04 LAB — POCT URINALYSIS DIPSTICK
SPEC GRAV UA: 1.015
pH, UA: 6

## 2014-10-04 MED ORDER — CEFPROZIL 500 MG PO TABS
500.0000 mg | ORAL_TABLET | Freq: Two times a day (BID) | ORAL | Status: DC
Start: 2014-10-04 — End: 2015-01-07

## 2014-10-04 NOTE — Patient Instructions (Signed)
citracal

## 2014-10-06 LAB — URINE CULTURE
Colony Count: NO GROWTH
ORGANISM ID, BACTERIA: NO GROWTH

## 2014-10-07 NOTE — Progress Notes (Signed)
Patient notified and verbalized understanding of the test results. No further questions. 

## 2014-10-08 ENCOUNTER — Encounter: Payer: Self-pay | Admitting: Nurse Practitioner

## 2014-10-08 NOTE — Progress Notes (Signed)
Subjective:  Presents for c/o dysuria for the past week. Some frequency and urgency. Feels "irritated" when she urinates. Married, same sexual partner. No discharge or rash. No recent UTI. Taking fluids well. No pelvic, back or flank pain. No fever.   Objective:   BP 120/78 mmHg  Temp(Src) 99 F (37.2 C) (Oral)  Ht 5\' 2"  (1.575 m)  Wt 104 lb 12.8 oz (47.537 kg)  BMI 19.16 kg/m2 NAD. Alert, oriented. Temp 99. Lungs clear. Heart RRR. No CVA tenderness. Abdomen soft, non distended with minimal suprapubic area tenderness.  Urine micro neg.   Assessment: Dysuria - Plan: POCT urinalysis dipstick, Urine culture  Plan:  Meds ordered this encounter  Medications  . cefPROZIL (CEFZIL) 500 MG tablet    Sig: Take 1 tablet (500 mg total) by mouth 2 (two) times daily.    Dispense:  14 tablet    Refill:  0    Order Specific Question:  Supervising Provider    Answer:  Mikey Kirschner [[id number]]   Urine culture pending. Because of slight elevation in temp and weekend, started on antibiotics. Call or go to ED this weekend if worse.

## 2014-11-08 ENCOUNTER — Telehealth: Payer: Self-pay | Admitting: Family Medicine

## 2014-11-08 NOTE — Telephone Encounter (Signed)
Pt has new orders for her Boniva injections at the Hospital that need to be signed She has an appt for Monday she can't go to if this is not done in time. Pt is aware  Of this an is ok if she has to reschedule.  Pt would also like to know if there is somewhere that it can be noted that she has to  Do these injections due to not being able to take the pill form with her acid reflux an IBS.   Please advise

## 2014-11-08 NOTE — Telephone Encounter (Signed)
Order is at the nurses station for you to sign so that she can get her injection on Monday before 2pm. The order has expired.

## 2014-11-09 NOTE — Telephone Encounter (Signed)
Will sign and it was noted why having to do [facility]

## 2014-11-11 ENCOUNTER — Encounter (HOSPITAL_COMMUNITY): Payer: Self-pay

## 2014-11-11 ENCOUNTER — Encounter (HOSPITAL_COMMUNITY)
Admission: RE | Admit: 2014-11-11 | Discharge: 2014-11-11 | Disposition: A | Discharge status: Home / Self Care | Payer: BC Managed Care – PPO | Source: Ambulatory Visit | Attending: Family Medicine | Admitting: Family Medicine

## 2014-11-11 DIAGNOSIS — Z79899 Other long term (current) drug therapy: Secondary | ICD-10-CM | POA: Insufficient documentation

## 2014-11-11 DIAGNOSIS — M810 Age-related osteoporosis without current pathological fracture: Secondary | ICD-10-CM | POA: Diagnosis present

## 2014-11-11 LAB — RENAL FUNCTION PANEL
ALBUMIN: 4.6 g/dL (ref 3.5–5.2)
Anion gap: 8 (ref 5–15)
BUN: 16 mg/dL (ref 6–23)
CALCIUM: 9.5 mg/dL (ref 8.4–10.5)
CHLORIDE: 106 mmol/L (ref 96–112)
CO2: 26 mmol/L (ref 19–32)
CREATININE: 0.49 mg/dL — AB (ref 0.50–1.10)
GFR calc Af Amer: >90 mL/min (ref >90)
GFR calc non Af Amer: >90 mL/min (ref >90)
Glucose, Bld: 103 mg/dL — ABNORMAL HIGH (ref 70–99)
Phosphorus: 3.7 mg/dL (ref 2.3–4.6)
Potassium: 3.6 mmol/L (ref 3.5–5.1)
Sodium: 140 mmol/L (ref 135–145)

## 2014-11-11 MED ORDER — IBANDRONATE SODIUM 3 MG/3ML IV SOLN
INTRAVENOUS | Status: AC
  Filled 2014-11-11: qty 3

## 2014-11-11 MED ORDER — IBANDRONATE SODIUM 3 MG/3ML IV SOLN
3.0000 mg | Freq: Once | INTRAVENOUS | Status: AC
  Administered 2014-11-11: 3 mg via INTRAVENOUS

## 2014-11-11 NOTE — Progress Notes (Signed)
Results for Isabel Atkinson, Isabel Atkinson (MRN [id number]) as of 11/11/2014 16:09  Renal panel drawn prior to IV Boniva 3mg . Tolerated well. Next appointment July 11th 2016   Ref. Range 11/11/2014 14:25  Sodium Latest Range: 135-145 mmol/L 140  Potassium Latest Range: 3.5-5.1 mmol/L 3.6  Chloride Latest Range: 96-112 mmol/L 106  CO2 Latest Range: 19-32 mmol/L 26  BUN Latest Range: 6-23 mg/dL 16  Creatinine Latest Range: 0.50-1.10 mg/dL 0.49 (L)  Calcium Latest Range: 8.4-10.5 mg/dL 9.5  GFR calc non Af Amer Latest Range: >90 mL/min >90  GFR calc Af Amer Latest Range: >90 mL/min >90  Glucose Latest Range: 70-99 mg/dL 103 (H)  Anion gap Latest Range: 5-15  8  Phosphorus Latest Range: 2.3-4.6 mg/dL 3.7  Albumin No range found 4.6

## 2014-11-12 ENCOUNTER — Other Ambulatory Visit: Payer: Self-pay | Admitting: Gastroenterology

## 2014-11-13 ENCOUNTER — Encounter: Payer: Self-pay | Admitting: Internal Medicine

## 2014-11-13 NOTE — Telephone Encounter (Signed)
APPOINTMENT MADE AND LETTER SENT °

## 2014-11-13 NOTE — Telephone Encounter (Signed)
Last seen in 02/2012. Sent letter for OV 2015.  No refills without an office visit first or she can get from PCP.

## 2014-11-13 NOTE — Telephone Encounter (Signed)
Isabel Atkinson, please schedule ov.  

## 2014-12-04 ENCOUNTER — Ambulatory Visit: Payer: BC Managed Care – PPO | Admitting: Gastroenterology

## 2014-12-17 ENCOUNTER — Ambulatory Visit: Payer: BC Managed Care – PPO | Admitting: Nurse Practitioner

## 2015-01-01 ENCOUNTER — Ambulatory Visit: Payer: BC Managed Care – PPO | Admitting: Nurse Practitioner

## 2015-01-07 ENCOUNTER — Encounter: Payer: Self-pay | Admitting: Nurse Practitioner

## 2015-01-07 ENCOUNTER — Ambulatory Visit (INDEPENDENT_AMBULATORY_CARE_PROVIDER_SITE_OTHER): Payer: BC Managed Care – PPO | Admitting: Nurse Practitioner

## 2015-01-07 VITALS — BP 125/77 | HR 76 | Temp 97.8°F | Ht 62.0 in | Wt 101.8 lb

## 2015-01-07 DIAGNOSIS — K219 Gastro-esophageal reflux disease without esophagitis: Secondary | ICD-10-CM | POA: Diagnosis not present

## 2015-01-07 NOTE — Assessment & Plan Note (Signed)
Patient with a history of GERD generally well controlled on Dexilant 30 mg daily. Patient states she has not taken this daily, rather states intermittently when she is having symptoms. Recommended using the medication daily to prevent combinations of chronic acid exposure. We will refill her Dexilant today and provide a co-pay card to help with co-pay costs. Return for further evaluation as needed

## 2015-01-07 NOTE — Patient Instructions (Signed)
1. Since in a refill of her Dexilant to the pharmacy. We did have co-pay cards provided one for you. 2. It is recommended that she take your Dexilant every day, even if her symptoms are currently well-controlled. 3. Return for further evaluation as needed for any change in her symptoms or worsening symptoms.

## 2015-01-07 NOTE — Progress Notes (Signed)
Referring Provider: Kathyrn Drown, MD Primary Care Physician:  Sallee Lange, MD Primary GI:  Dr. Gala Romney  Chief Complaint  Patient presents with  . Medication Refill    HPI:   49 year old female presents for routine follow-up for further prescriptions of Dexilant. Last colonoscopy 02/08/2012 for chronic diarrhea found normal rectum, single polyp which was removed, normal terminal ileum and pathology showed hyperplastic polyp. 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 Pathology of duodenum and colon biopsies were all histologically normal.  Today she states she does not take Dexilant every day, but when she needs it it works very well for her. GERD symptoms generally well controlled. When she does have symptoms, she experiences epigastric/chest pain and "spewing" where hot, bitter material regurgitates. Denies any other abdominal pain. Is having some nausea, which she attributes to acid reflux as she is out of her medication and/or sinus drainage. Denies chest pain, dyspnea, dizziness, syncop, near syncope, hematochezia, and melena. Denies any other upper or lower GI symptoms.  Past Medical History  Diagnosis Date  . Anxiety   . Reactive airway disease   . Pre-diabetes     Past Surgical History  Procedure Laterality Date  . Tubal ligation  2000  . Ovarian cyst surgery  1997    removed  . Neck surgery  10/2012  . Esophagogastroduodenoscopy  2013    RMR: Possible cervical esophageal web-status post dilation as described above. Small hiatal hernia. Status post biopsy of normal appearing small bowel to screen for celiac disease.   . Colonoscopy  2013    RMR: Normal rectum. single diminutive sigmoid polyp noted above. Normal terminal ileum. Status post segmental biopsy.     Current Outpatient Prescriptions    Medication Sig Dispense Refill  . acetaminophen (TYLENOL) 500 MG tablet Take 1,000 mg by mouth every 6 (six) hours as needed. For pain or fever    . ALPRAZolam (XANAX) 0.25 MG tablet TAKE 1/2 TO 1 TABLET BY MOUTH TWICE DAILY AS NEEDED FOR ANXIETY. 30 tablet 2  . cycloSPORINE (RESTASIS) 0.05 % ophthalmic emulsion Place 1 drop into both eyes daily as needed. For dry eyes    . Dexlansoprazole (DEXILANT) 30 MG capsule Take 1 capsule (30 mg total) by mouth daily. 30 capsule 5  . Ibandronate Sodium (BONIVA IV) Inject into the vein every 3 (three) months.    . montelukast (SINGULAIR) 10 MG tablet Take 1 tablet (10 mg total) by mouth at bedtime. 30 tablet 2  . Probiotic Product (ALIGN PO) Take by mouth.    . valACYclovir (VALTREX) 1000 MG tablet Take 2 tabs po BID for 1 day for cold sores 4 tablet 12  . cefPROZIL (CEFZIL) 500 MG tablet Take 1 tablet (500 mg total) by mouth 2 (two) times daily. (Patient not taking: Reported on 01/07/2015) 14 tablet 0  . HYDROcodone-acetaminophen (NORCO/VICODIN) 5-325 MG per tablet Take 1 tablet by mouth every 4 (four) hours as needed. (Patient not taking: Reported on 01/07/2015) 30 tablet 0   No current facility-administered medications for this visit.    Allergies as of 01/07/2015 - Review Complete 01/07/2015  Allergen Reaction Noted  . Sulfa antibiotics Itching 01/11/2012  . Fosamax [alendronate sodium] Nausea And Vomiting 11/09/2014    Family History  Problem Relation Age of Onset  . Colon cancer Maternal Grandfather 73  . Colon polyps Mother 70  .  Colon cancer Maternal Uncle 58  . Hypertension Father   . Diabetes Father   . Heart disease Father   . Hyperlipidemia Father     History   Social History  . Marital Status: Married    Spouse Name: N/A  . Number of Children: 2  . 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 Smoking status: Current Every Day Smoker -- 1.00 packs/day for 25 years     Types: Cigarettes  . Smokeless tobacco: Not on file  . Alcohol Use: No  . Drug Use: No  . Sexual Activity: Yes    Birth Control/ Protection: Post-menopausal, Surgical   Other Topics Concern  . Not on file   Social History Narrative   2 children -21/13    Review of Systems: General: Negative for anorexia, weight loss, fever, chills, fatigue, weakness. ENT: Negative for hoarseness, difficulty swallowing. CV: Negative for chest pain, angina, palpitations.  Respiratory: Negative for dyspnea at rest, cough, wheezing.  GI: See history of present illness.  Endo: Negative for unusual weight change.  Heme: Negative for bruising or bleeding.   Physical Exam: BP 125/77 mmHg  Pulse 76  Temp(Src) 97.8 F (36.6 C) (Oral)  Ht 5\' 2"  (1.575 m)  Wt 101 lb 12.8 oz (46.176 kg)  BMI 18.61 kg/m2 General:   Alert and oriented. Pleasant and cooperative. Well-nourished and well-developed.  Head:  Normocephalic and atraumatic. Cardiovascular:  S1, S2 present without murmurs appreciated. Extremities without clubbing or edema. Respiratory:  Clear to auscultation bilaterally. No wheezes, rales, or rhonchi. No distress.  Gastrointestinal:  +BS, soft, and non-distended. Mild epigastric TTP. No HSM noted. No guarding or rebound. No masses appreciated.  Rectal:  Deferred  Neurologic:  Alert and oriented x4;  grossly normal neurologically. Psych:  Alert and cooperative. Normal mood and affect.    01/07/2015 2:09 PM

## 2015-01-08 ENCOUNTER — Other Ambulatory Visit: Payer: Self-pay

## 2015-01-08 MED ORDER — DEXLANSOPRAZOLE 30 MG PO CPDR: 30.0000 mg | DELAYED_RELEASE_CAPSULE | Freq: Every day | ORAL | Status: DC

## 2015-01-09 ENCOUNTER — Telehealth: Payer: Self-pay

## 2015-01-09 NOTE — Telephone Encounter (Signed)
Noted and quite impressed. Please call the patient and inform her of this. Tell her I'm sorry, but at this point we'll need to cycle through all these to trial in order for her insurance company to approve Dexilant even though it works well for her. We can start with Protonix 40 mg daily, dispense 30 with no refills (please call it into her pharmacy). Have her call us in 3-4 weeks and notify how she's doing. If she fails it we can move on to the next one. Thanks.

## 2015-01-09 NOTE — Telephone Encounter (Signed)
Received PA request from the pharmacy. Tried to do the PA- pt has tried and failed omeprazole and nexium.   Per pts insurance: pt must try and fail ALL generic ppi's (Rx and OTC) which includes-  Esomeprazole rabeprazole Omeprazole Pantoprazole Omeprazole/sodium bicarb  Pt will have to try and fail all of these prior to getting approval for dexilant.

## 2015-01-10 NOTE — Telephone Encounter (Signed)
Tried to call pt. LMOM for a return call.

## 2015-01-13 NOTE — Telephone Encounter (Signed)
Pt is aware of new rx  °

## 2015-01-14 ENCOUNTER — Telehealth: Payer: Self-pay

## 2015-01-14 NOTE — Telephone Encounter (Signed)
Pt is requesting the dexilant. States it hasn't been called into pharmacy

## 2015-01-15 ENCOUNTER — Other Ambulatory Visit: Payer: Self-pay | Admitting: Nurse Practitioner

## 2015-01-15 MED ORDER — PANTOPRAZOLE SODIUM 40 MG PO TBEC: 40.0000 mg | DELAYED_RELEASE_TABLET | Freq: Every day | ORAL | Status: DC

## 2015-01-15 MED ORDER — DEXLANSOPRAZOLE 30 MG PO CPDR: 30.0000 mg | DELAYED_RELEASE_CAPSULE | Freq: Every day | ORAL | Status: DC

## 2015-01-15 NOTE — Telephone Encounter (Signed)
It should be there now. Thanks!

## 2015-01-15 NOTE — Telephone Encounter (Signed)
rx for protonix has been sent to the pharmacy.

## 2015-01-15 NOTE — Telephone Encounter (Signed)
Called [facility] regarding medication refill

## 2015-01-22 NOTE — Progress Notes (Signed)
cc'd to pcp 

## 2015-02-04 ENCOUNTER — Encounter: Payer: Self-pay | Admitting: Nurse Practitioner

## 2015-02-04 ENCOUNTER — Ambulatory Visit (INDEPENDENT_AMBULATORY_CARE_PROVIDER_SITE_OTHER): Payer: BC Managed Care – PPO | Admitting: Nurse Practitioner

## 2015-02-04 VITALS — BP 118/78 | Temp 98.4°F | Wt 102.0 lb

## 2015-02-04 DIAGNOSIS — J0100 Acute maxillary sinusitis, unspecified: Secondary | ICD-10-CM

## 2015-02-04 DIAGNOSIS — R300 Dysuria: Secondary | ICD-10-CM | POA: Diagnosis not present

## 2015-02-04 DIAGNOSIS — R350 Frequency of micturition: Secondary | ICD-10-CM

## 2015-02-04 DIAGNOSIS — B354 Tinea corporis: Secondary | ICD-10-CM

## 2015-02-04 LAB — POCT URINALYSIS DIPSTICK
Blood, UA: 10
SPEC GRAV UA: 1.01
pH, UA: 5

## 2015-02-04 MED ORDER — LEVOFLOXACIN 500 MG PO TABS: 500.0000 mg | ORAL_TABLET | Freq: Every day | ORAL | Status: DC

## 2015-02-04 MED ORDER — KETOCONAZOLE 2 % EX CREA: 1.0000 "application " | TOPICAL_CREAM | Freq: Two times a day (BID) | CUTANEOUS | Status: DC

## 2015-02-04 MED ORDER — TRIAMCINOLONE ACETONIDE 0.1 % EX CREA: 1.0000 "application " | TOPICAL_CREAM | Freq: Two times a day (BID) | CUTANEOUS | Status: DC

## 2015-02-07 ENCOUNTER — Encounter: Payer: Self-pay | Admitting: Nurse Practitioner

## 2015-02-07 NOTE — Progress Notes (Signed)
Subjective: Presents for several issues. Has had left ear pain for the past 2 weeks. No fever. Slight cough. No wheezing. Slight sore throat. Maxillary area sinus pressure. Postnasal drainage. Also complaints of urinary symptoms over the past week. Increase frequency. Slight stinging with urination. No vaginal discharge. No itching or burning. Married, same sexual partner. No pelvic pain. No back or flank pain. Also has a rash on her left side for several days, has spread slightly. Mildly pruritic.  Objective:   BP 118/78 mmHg  Temp(Src) 98.4 F (36.9 C)  Wt 102 lb (46.267 kg) NAD. Alert, oriented. TMs retracted, no erythema. Pharynx injected with PND noted. Neck supple with mild soft anterior adenopathy. Lungs clear. Heart regular rhythm. Abdomen soft nondistended nontender. No CVA or flank tenderness. Circular well defined lesion with pink papules and some center clearing noted on the left trunk area. Results for orders placed or performed in visit on 02/04/15  POCT urinalysis dipstick  Result Value Ref Range   Color, UA yellow    Clarity, UA clear    Glucose, UA     Bilirubin, UA     Ketones, UA     Spec Grav, UA 1.010    Blood, UA 10    pH, UA 5.0    Protein, UA     Urobilinogen, UA     Nitrite, UA     Leukocytes, UA  Negative   Urine micro-negative.  Assessment: Acute maxillary sinusitis, recurrence not specified  Dysuria - Plan: POCT urinalysis dipstick  Urine frequency - Plan: POCT urinalysis dipstick  Tinea corporis  Plan:  Meds ordered this encounter  Medications  . levofloxacin (LEVAQUIN) 500 MG tablet    Sig: Take 1 tablet (500 mg total) by mouth daily.    Dispense:  10 tablet    Refill:  0    Order Specific Question:  Supervising Provider    Answer:  Mikey Kirschner [[id number]]  . ketoconazole (NIZORAL) 2 % cream    Sig: Apply 1 application topically 2 (two) times daily.    Dispense:  30 g    Refill:  4    Order Specific Question:  Supervising Provider   Answer:  Mikey Kirschner [[id number]]  . triamcinolone cream (KENALOG) 0.1 %    Sig: Apply 1 application topically 2 (two) times daily. Prn rash; use up to 2 weeks    Dispense:  30 g    Refill:  0    Order Specific Question:  Supervising Provider    Answer:  Mikey Kirschner [[id number]]   OTC meds as directed for congestion and cough. Callback in 4-5 days if no improvement, sooner if worse. Warning signs reviewed. Use a small amount of ketoconazole and triamcinolone together on rash. Call back in 2 weeks if persists.

## 2015-02-11 NOTE — Telephone Encounter (Signed)
Pt is calling because the Protonix is not working. She would to see if they will pay for the [place] now.

## 2015-02-13 ENCOUNTER — Other Ambulatory Visit: Payer: Self-pay | Admitting: Nurse Practitioner

## 2015-02-13 MED ORDER — ESOMEPRAZOLE MAGNESIUM 40 MG PO CPDR: 40.0000 mg | DELAYED_RELEASE_CAPSULE | Freq: Every day | ORAL | Status: DC

## 2015-02-13 NOTE — Telephone Encounter (Signed)
Unfortunately, as per previous note, she has to fail ALL PPIs. She's currently failed omeprazole, pantoprazole. I'll send in Nexium (esomeprazole.) Call us in a few weeks and let us know if it's helping. If not we'll still have to try the next one. There are about 2-3 more she'll have to try first (per her insurance.) She can always call and see if they'll allow her to appeal.

## 2015-02-17 ENCOUNTER — Encounter (HOSPITAL_COMMUNITY)
Admission: RE | Admit: 2015-02-17 | Discharge: 2015-02-17 | Disposition: A | Discharge status: Home / Self Care | Payer: BC Managed Care – PPO | Source: Ambulatory Visit | Attending: Family Medicine | Admitting: Family Medicine

## 2015-02-17 DIAGNOSIS — M810 Age-related osteoporosis without current pathological fracture: Secondary | ICD-10-CM | POA: Diagnosis present

## 2015-02-17 LAB — RENAL FUNCTION PANEL
ALBUMIN: 4.2 g/dL (ref 3.5–5.0)
Anion gap: 7 (ref 5–15)
BUN: 15 mg/dL (ref 6–20)
CHLORIDE: 107 mmol/L (ref 101–111)
CO2: 28 mmol/L (ref 22–32)
Calcium: 8.9 mg/dL (ref 8.9–10.3)
Creatinine, Ser: 0.54 mg/dL (ref 0.44–1.00)
GFR calc Af Amer: >60 mL/min (ref >60)
Glucose, Bld: 88 mg/dL (ref 65–99)
Phosphorus: 3.8 mg/dL (ref 2.5–4.6)
Potassium: 3.7 mmol/L (ref 3.5–5.1)
SODIUM: 142 mmol/L (ref 135–145)

## 2015-02-17 MED ORDER — SODIUM CHLORIDE 0.9 % IJ SOLN
10.0000 mL | Freq: Once | INTRAMUSCULAR | Status: AC
  Administered 2015-02-17: 10 mL via INTRAVENOUS

## 2015-02-17 MED ORDER — IBANDRONATE SODIUM 3 MG/3ML IV SOLN
3.0000 mg | Freq: Once | INTRAVENOUS | Status: AC
  Administered 2015-02-17: 3 mg via INTRAVENOUS

## 2015-02-17 MED ORDER — IBANDRONATE SODIUM 3 MG/3ML IV SOLN
INTRAVENOUS | Status: AC
  Filled 2015-02-17: qty 3

## 2015-02-17 NOTE — Progress Notes (Signed)
Results for Isabel Atkinson, Isabel Atkinson (MRN [id number]) as of 02/17/2015 13:56  Ref. Range 02/17/2015 08:15  Sodium Latest Ref Range: 135-145 mmol/L 142  Potassium Latest Ref Range: 3.5-5.1 mmol/L 3.7  Chloride Latest Ref Range: 101-111 mmol/L 107  CO2 Latest Ref Range: 22-32 mmol/L 28  BUN Latest Ref Range: 6-20 mg/dL 15  Creatinine Latest Ref Range: 0.44-1.00 mg/dL 0.54  Calcium Latest Ref Range: 8.9-10.3 mg/dL 8.9  EGFR (Non-African Amer.) Latest Ref Range: >60 mL/min >60  EGFR (African American) Latest Ref Range: >60 mL/min >60  Glucose Latest Ref Range: 65-99 mg/dL 88  Anion gap Latest Ref Range: 5-15  7  Phosphorus Latest Ref Range: 2.5-4.6 mg/dL 3.8  Albumin Latest Ref Range: 3.5-5.0 g/dL 4.2

## 2015-02-18 NOTE — Telephone Encounter (Signed)
LMOM to call back

## 2015-02-24 NOTE — Telephone Encounter (Signed)
Tried to call with no answer  

## 2015-02-26 NOTE — Telephone Encounter (Signed)
Mailed her a letter

## 2015-04-09 ENCOUNTER — Other Ambulatory Visit: Payer: Self-pay | Admitting: Family Medicine

## 2015-04-21 ENCOUNTER — Encounter: Payer: Self-pay | Admitting: Family Medicine

## 2015-04-21 ENCOUNTER — Ambulatory Visit (INDEPENDENT_AMBULATORY_CARE_PROVIDER_SITE_OTHER): Payer: BC Managed Care – PPO | Admitting: Family Medicine

## 2015-04-21 VITALS — BP 118/76 | Temp 99.9°F | Ht 62.0 in | Wt 101.8 lb

## 2015-04-21 DIAGNOSIS — J019 Acute sinusitis, unspecified: Secondary | ICD-10-CM | POA: Diagnosis not present

## 2015-04-21 DIAGNOSIS — B9689 Other specified bacterial agents as the cause of diseases classified elsewhere: Secondary | ICD-10-CM

## 2015-04-21 DIAGNOSIS — R300 Dysuria: Secondary | ICD-10-CM

## 2015-04-21 DIAGNOSIS — J301 Allergic rhinitis due to pollen: Secondary | ICD-10-CM | POA: Diagnosis not present

## 2015-04-21 LAB — POCT URINALYSIS DIPSTICK
Blood, UA: 250
Spec Grav, UA: 1.02
pH, UA: 5

## 2015-04-21 MED ORDER — CEFPROZIL 500 MG PO TABS: 500.0000 mg | ORAL_TABLET | Freq: Two times a day (BID) | ORAL | Status: DC

## 2015-04-21 MED ORDER — FLUCONAZOLE 150 MG PO TABS: 150.0000 mg | ORAL_TABLET | Freq: Once | ORAL | Status: DC

## 2015-04-21 NOTE — Progress Notes (Signed)
   Subjective:    Patient ID: Isabel Atkinson, female    DOB: [birth year], 49 y.o.   MRN: [id number]  Sinusitis This is a new problem. Episode onset: 4 days ago. Associated symptoms include congestion, coughing, ear pain, headaches and a sore throat. Pertinent negatives include no shortness of breath.   Painful urination for the past 3 -4 days.  3 to 4 days ledt side facila pain  Review of Systems  Constitutional: Negative for fever and activity change.  HENT: Positive for congestion, ear pain, rhinorrhea and sore throat.   Eyes: Negative for discharge.  Respiratory: Positive for cough. Negative for shortness of breath and wheezing.   Cardiovascular: Negative for chest pain.  Neurological: Positive for headaches.       Objective:   Physical Exam  Constitutional: She appears well-developed.  HENT:  Head: Normocephalic.  Nose: Nose normal.  Mouth/Throat: Oropharynx is clear and moist. No oropharyngeal exudate.  Neck: Neck supple.  Cardiovascular: Normal rate and normal heart sounds.   No murmur heard. Pulmonary/Chest: Effort normal and breath sounds normal. She has no wheezes.  Lymphadenopathy:    She has no cervical adenopathy.  Skin: Skin is warm and dry.  Nursing note and vitals reviewed.         Assessment & Plan:  Viral syndrome Secondary sinusitis Antibodies prescribed warning signs discussed Patient was encouraged quit smoking

## 2015-04-22 ENCOUNTER — Telehealth: Payer: Self-pay | Admitting: Internal Medicine

## 2015-04-22 NOTE — Telephone Encounter (Signed)
Called pt and LMOM for pt to call office back

## 2015-04-22 NOTE — Telephone Encounter (Signed)
Please see 01/09/15 phone note. Pt still hasnt tried and failed rabeprazole and generic zegerid, in order for her insurance to pay for dexilant.

## 2015-04-22 NOTE — Telephone Encounter (Signed)
Pt said her generic Nexium isn't working for her and could we call in [place] instead. She uses [facility]. [phone]

## 2015-04-23 MED ORDER — RABEPRAZOLE SODIUM 20 MG PO TBEC: 20.0000 mg | DELAYED_RELEASE_TABLET | Freq: Every day | ORAL | Status: DC

## 2015-04-23 NOTE — Telephone Encounter (Signed)
Routing to the refill box. 

## 2015-04-23 NOTE — Addendum Note (Signed)
Addended by: Orvil Feil on: 04/23/2015 04:40 PM   Modules accepted: Orders

## 2015-04-23 NOTE — Telephone Encounter (Signed)
Pt called back and states that the Nexium isn't working. So she would like to try another medication to see if that helps.

## 2015-04-23 NOTE — Telephone Encounter (Signed)
I sent in generic Aciphex, 30 day supply. Will need to trial generic zegerid if aciphex fails. THEN she can have Dexilant approved hopefully.

## 2015-04-24 ENCOUNTER — Encounter: Payer: Self-pay | Admitting: Nurse Practitioner

## 2015-04-24 ENCOUNTER — Ambulatory Visit (HOSPITAL_COMMUNITY)
Admission: RE | Admit: 2015-04-24 | Discharge: 2015-04-24 | Disposition: A | Discharge status: Home / Self Care | Payer: BC Managed Care – PPO | Source: Ambulatory Visit | Attending: Nurse Practitioner | Admitting: Nurse Practitioner

## 2015-04-24 ENCOUNTER — Encounter: Payer: Self-pay | Admitting: Family Medicine

## 2015-04-24 ENCOUNTER — Ambulatory Visit (INDEPENDENT_AMBULATORY_CARE_PROVIDER_SITE_OTHER): Payer: BC Managed Care – PPO | Admitting: Nurse Practitioner

## 2015-04-24 VITALS — BP 122/70 | Temp 98.6°F | Ht 62.0 in | Wt 101.0 lb

## 2015-04-24 DIAGNOSIS — K219 Gastro-esophageal reflux disease without esophagitis: Secondary | ICD-10-CM

## 2015-04-24 DIAGNOSIS — R5383 Other fatigue: Secondary | ICD-10-CM | POA: Diagnosis not present

## 2015-04-24 DIAGNOSIS — R05 Cough: Secondary | ICD-10-CM | POA: Diagnosis present

## 2015-04-24 DIAGNOSIS — R911 Solitary pulmonary nodule: Secondary | ICD-10-CM | POA: Diagnosis not present

## 2015-04-24 DIAGNOSIS — F17200 Nicotine dependence, unspecified, uncomplicated: Secondary | ICD-10-CM

## 2015-04-24 DIAGNOSIS — Z13220 Encounter for screening for lipoid disorders: Secondary | ICD-10-CM

## 2015-04-24 DIAGNOSIS — Z720 Tobacco use: Secondary | ICD-10-CM

## 2015-04-24 DIAGNOSIS — R059 Cough, unspecified: Secondary | ICD-10-CM

## 2015-04-24 DIAGNOSIS — K2970 Gastritis, unspecified, without bleeding: Secondary | ICD-10-CM

## 2015-04-24 MED ORDER — DEXLANSOPRAZOLE 30 MG PO CPDR: 30.0000 mg | DELAYED_RELEASE_CAPSULE | Freq: Every day | ORAL | Status: DC

## 2015-04-25 DIAGNOSIS — K2970 Gastritis, unspecified, without bleeding: Secondary | ICD-10-CM | POA: Insufficient documentation

## 2015-04-25 NOTE — Progress Notes (Signed)
Subjective:  Presents for recheck on her sinuses. Was seen on 04/21/2015 and started on Cefzil. No fever. Continues to have off-and-on sore throat. Having breakthrough reflux symptoms on Nexium which she has been on for over a month. Has tried several medicines with minimal improvement. Did best on dexilant but insurance requires prior authorization. Continues to smoke. Sinus headache much improved. Ear pressure with pain at times. Runny nose. Minimal cough. No wheezing. Generalized fatigue for several months. Weight has been stable. No chest pain/ischemic type pain or unusual shortness of breath. Has had significant stress over the past several months, in addition to her job has a teenager at home, her daughter and 2 small grandchildren are also living at the home. Patient is responsible for their care in the evening and at nighttime. Has not been sleeping as well. Unsure about any signs of sleep apnea.  Objective:   BP 122/70 mmHg  Temp(Src) 98.6 F (37 C) (Oral)  Ht 5\' 2"  (1.575 m)  Wt 101 lb (45.813 kg)  BMI 18.47 kg/m2 NAD. Alert, oriented. TMs clear effusion, no erythema. Pharynx nonerythematous with faint green PND. Neck supple with mild soft anterior adenopathy. Lungs clear. Good airflow. Heart regular rate rhythm. Abdomen soft nondistended with distinct epigastric area tenderness. No rebound or guarding. No obvious masses.  Assessment:  Problem List Items Addressed This Visit      Digestive   Gastritis   GERD (gastroesophageal reflux disease) - Primary   Relevant Medications   Dexlansoprazole (DEXILANT) 30 MG capsule    Other Visit Diagnoses    Other fatigue        Relevant Orders    CBC with Differential/Platelet    Hepatic function panel    Basic metabolic panel    TSH    Vit D  25 hydroxy (rtn osteoporosis monitoring)    Cough        Relevant Orders    DG Chest 2 View (Completed)    Smoker        Relevant Orders    DG Chest 2 View (Completed)    Screening for lipid  disorders        Relevant Orders    Lipid panel      Plan: Given samples of OTC Nexium to take 2 per day, to take this until we can try to get PA for dexilant. Discussed smoking cessation at length including risk of oral throat esophageal and lung cancer especially considering her reflux history. Discussed importance of stress reduction. Lab work and chest x-ray pending. Complete Cefzil as directed. Return if symptoms worsen or fail to improve.

## 2015-04-26 LAB — BASIC METABOLIC PANEL
BUN/Creatinine Ratio: 27 — ABNORMAL HIGH (ref 9–23)
BUN: 12 mg/dL (ref 6–24)
CALCIUM: 9.6 mg/dL (ref 8.7–10.2)
CHLORIDE: 103 mmol/L (ref 97–108)
CO2: 24 mmol/L (ref 18–29)
CREATININE: 0.44 mg/dL — AB (ref 0.57–1.00)
GFR calc Af Amer: 136 mL/min/{1.73_m2} (ref >59)
GFR calc non Af Amer: 118 mL/min/{1.73_m2} (ref >59)
GLUCOSE: 83 mg/dL (ref 65–99)
Potassium: 4.1 mmol/L (ref 3.5–5.2)
Sodium: 144 mmol/L (ref 134–144)

## 2015-04-26 LAB — HEPATIC FUNCTION PANEL
ALT: 14 IU/L (ref 0–32)
AST: 21 IU/L (ref 0–40)
Albumin: 4.7 g/dL (ref 3.5–5.5)
Alkaline Phosphatase: 61 IU/L (ref 39–117)
Bilirubin Total: 0.4 mg/dL (ref 0.0–1.2)
Bilirubin, Direct: 0.1 mg/dL (ref 0.00–0.40)
TOTAL PROTEIN: 6.8 g/dL (ref 6.0–8.5)

## 2015-04-26 LAB — CBC WITH DIFFERENTIAL/PLATELET
BASOS: 0 %
Basophils Absolute: 0 10*3/uL (ref 0.0–0.2)
EOS (ABSOLUTE): 0.4 10*3/uL (ref 0.0–0.4)
EOS: 5 %
HEMATOCRIT: 41.1 % (ref 34.0–46.6)
Hemoglobin: 13.6 g/dL (ref 11.1–15.9)
Immature Grans (Abs): 0 10*3/uL (ref 0.0–0.1)
Immature Granulocytes: 0 %
Lymphocytes Absolute: 2.2 10*3/uL (ref 0.7–3.1)
Lymphs: 30 %
MCH: 30.1 pg (ref 26.6–33.0)
MCHC: 33.1 g/dL (ref 31.5–35.7)
MCV: 91 fL (ref 79–97)
MONOCYTES: 7 %
MONOS ABS: 0.5 10*3/uL (ref 0.1–0.9)
NEUTROS PCT: 58 %
Neutrophils Absolute: 4.2 10*3/uL (ref 1.4–7.0)
Platelets: 302 10*3/uL (ref 150–379)
RBC: 4.52 x10E6/uL (ref 3.77–5.28)
RDW: 13.3 % (ref 12.3–15.4)
WBC: 7.4 10*3/uL (ref 3.4–10.8)

## 2015-04-26 LAB — LIPID PANEL
CHOL/HDL RATIO: 2.5 ratio (ref 0.0–4.4)
Cholesterol, Total: 191 mg/dL (ref 100–199)
HDL: 76 mg/dL (ref >39)
LDL CALC: 97 mg/dL (ref 0–99)
TRIGLYCERIDES: 89 mg/dL (ref 0–149)
VLDL CHOLESTEROL CAL: 18 mg/dL (ref 5–40)

## 2015-04-26 LAB — TSH: TSH: 0.962 u[IU]/mL (ref 0.450–4.500)

## 2015-04-26 LAB — VITAMIN D 25 HYDROXY (VIT D DEFICIENCY, FRACTURES): VIT D 25 HYDROXY: 36.4 ng/mL (ref 30.0–100.0)

## 2015-04-30 ENCOUNTER — Telehealth: Payer: Self-pay | Admitting: Family Medicine

## 2015-04-30 NOTE — Telephone Encounter (Signed)
Rx prior auth APPROVED for pt's Dexlansoprazole (DEXILANT) 30 MG capsule, auth good 03/31/15-04/29/16 Case# [id number] for the medication & Case# [id number] for the quantity limit override  Faxed approval to [place] to notify pt

## 2015-05-02 ENCOUNTER — Ambulatory Visit (INDEPENDENT_AMBULATORY_CARE_PROVIDER_SITE_OTHER): Payer: BC Managed Care – PPO | Admitting: Family Medicine

## 2015-05-02 ENCOUNTER — Encounter: Payer: Self-pay | Admitting: Family Medicine

## 2015-05-02 VITALS — BP 138/80 | HR 89 | Temp 99.4°F | Ht 62.0 in | Wt 103.5 lb

## 2015-05-02 DIAGNOSIS — F410 Panic disorder [episodic paroxysmal anxiety] without agoraphobia: Secondary | ICD-10-CM

## 2015-05-02 DIAGNOSIS — R000 Tachycardia, unspecified: Secondary | ICD-10-CM | POA: Diagnosis not present

## 2015-05-02 MED ORDER — SERTRALINE HCL 50 MG PO TABS: 50.0000 mg | ORAL_TABLET | Freq: Every day | ORAL | Status: DC

## 2015-05-02 MED ORDER — CITALOPRAM HYDROBROMIDE 10 MG PO TABS: 10.0000 mg | ORAL_TABLET | Freq: Every day | ORAL | Status: DC

## 2015-05-02 NOTE — Progress Notes (Signed)
   Subjective:    Patient ID: Isabel Atkinson, female    DOB: Aug 01, 1966, 49 y.o.   MRN: [id number]  Dizziness This is a new problem. The current episode started in the past 7 days. The problem occurs constantly. The problem has been unchanged. Associated symptoms include headaches. Nothing aggravates the symptoms. Treatments tried: antibiotic. The treatment provided no relief.  Patient has no other concerns at this time.   patients had 2-3 spells she says is similar to what she had when she had a panic attack in the past. She states she finds her heart runs fast makes her feel funny and then  Will get better. She states one time her heart rate got up to 152.   Review of Systems  Neurological: Positive for dizziness and headaches.   patient describes intermittent tachycardia. States feeling nervous sweaty anxious feeling. Under fair amount of stress.     Objective:   Physical Exam   lungs are clear hearts regular pulse normal BP good abdomen soft extremities no edema blood pressure good   EKG did not show any Wolff-Parkinson-White    Assessment & Plan:   tachycardia- I doubt underlying cardiac disease but if this keeps reoccurring the patient will need referral to cardiology and a vent monitor    patient was encouraged quit smoking    I believe patient having some panic attacks I recommend starting medication , Zoloft 50 mg 1 daily. Follow-up in 4 weeks sooner problems   patient was told to keep track of these spells keep track of her heart rate if it's purely issue of the heart rate jumping up to 150 for several minutes then getting better she needs to let us know so we will set her up with cardiology she understands this

## 2015-05-20 ENCOUNTER — Encounter (HOSPITAL_COMMUNITY)
Admission: RE | Admit: 2015-05-20 | Discharge: 2015-05-20 | Disposition: A | Discharge status: Home / Self Care | Payer: BC Managed Care – PPO | Source: Ambulatory Visit | Attending: Family Medicine | Admitting: Family Medicine

## 2015-05-20 ENCOUNTER — Encounter: Payer: Self-pay | Admitting: Family Medicine

## 2015-05-20 DIAGNOSIS — M810 Age-related osteoporosis without current pathological fracture: Secondary | ICD-10-CM | POA: Insufficient documentation

## 2015-05-20 LAB — RENAL FUNCTION PANEL
Albumin: 4.4 g/dL (ref 3.5–5.0)
Anion gap: 4 — ABNORMAL LOW (ref 5–15)
BUN: 13 mg/dL (ref 6–20)
CHLORIDE: 107 mmol/L (ref 101–111)
CO2: 27 mmol/L (ref 22–32)
CREATININE: 0.41 mg/dL — AB (ref 0.44–1.00)
Calcium: 9.4 mg/dL (ref 8.9–10.3)
GFR calc non Af Amer: >60 mL/min (ref >60)
Glucose, Bld: 98 mg/dL (ref 65–99)
PHOSPHORUS: 3.1 mg/dL (ref 2.5–4.6)
POTASSIUM: 3.6 mmol/L (ref 3.5–5.1)
Sodium: 138 mmol/L (ref 135–145)

## 2015-05-20 MED ORDER — SODIUM CHLORIDE 0.9 % IJ SOLN
10.0000 mL | Freq: Once | INTRAMUSCULAR | Status: AC
  Administered 2015-05-20: 10 mL via INTRAVENOUS

## 2015-05-20 MED ORDER — IBANDRONATE SODIUM 3 MG/3ML IV SOLN
INTRAVENOUS | Status: AC
  Filled 2015-05-20: qty 3

## 2015-05-20 MED ORDER — IBANDRONATE SODIUM 3 MG/3ML IV SOLN
3.0000 mg | Freq: Once | INTRAVENOUS | Status: AC
  Administered 2015-05-20: 3 mg via INTRAVENOUS

## 2015-05-20 NOTE — Progress Notes (Signed)
Awake. Sitting in chair. States "I don't feel good.". Feels dizzy and lightheaded. Placed in recliner. VSS.

## 2015-05-20 NOTE — Progress Notes (Signed)
Awake. Wants to go home. Discharged to home in good condition.

## 2015-05-20 NOTE — Progress Notes (Signed)
States "I feel better." Wants to go to the [facility]. Ambulated to [facility]. Diarrhea x1. Returned to [employer]. Sprite given to drink. Tolerated well.

## 2015-05-21 NOTE — Progress Notes (Signed)
Results for Isabel Atkinson, Isabel Atkinson (MRN [id number]) as of 05/21/2015 10:12  Ref. Range 05/20/2015 14:15  Sodium Latest Ref Range: 135-145 mmol/L 138  Potassium Latest Ref Range: 3.5-5.1 mmol/L 3.6  Chloride Latest Ref Range: 101-111 mmol/L 107  CO2 Latest Ref Range: 22-32 mmol/L 27  BUN Latest Ref Range: 6-20 mg/dL 13  Creatinine Latest Ref Range: 0.44-1.00 mg/dL 0.41 (L)  Calcium Latest Ref Range: 8.9-10.3 mg/dL 9.4  EGFR (Non-African Amer.) Latest Ref Range: >60 mL/min >60  EGFR (African American) Latest Ref Range: >60 mL/min >60  Glucose Latest Ref Range: 65-99 mg/dL 98  Anion gap Latest Ref Range: 5-15  4 (L)  Phosphorus Latest Ref Range: 2.5-4.6 mg/dL 3.1  Albumin Latest Ref Range: 3.5-5.0 g/dL 4.4

## 2015-06-02 ENCOUNTER — Ambulatory Visit (INDEPENDENT_AMBULATORY_CARE_PROVIDER_SITE_OTHER): Payer: BC Managed Care – PPO | Admitting: Family Medicine

## 2015-06-02 ENCOUNTER — Encounter: Payer: Self-pay | Admitting: Family Medicine

## 2015-06-02 VITALS — BP 124/80 | Ht 62.0 in | Wt 104.0 lb

## 2015-06-02 DIAGNOSIS — R000 Tachycardia, unspecified: Secondary | ICD-10-CM | POA: Diagnosis not present

## 2015-06-02 DIAGNOSIS — F410 Panic disorder [episodic paroxysmal anxiety] without agoraphobia: Secondary | ICD-10-CM

## 2015-06-02 MED ORDER — CITALOPRAM HYDROBROMIDE 10 MG PO TABS: 10.0000 mg | ORAL_TABLET | Freq: Every day | ORAL | Status: DC

## 2015-06-02 MED ORDER — ALPRAZOLAM 0.25 MG PO TABS: ORAL_TABLET | ORAL | Status: DC

## 2015-06-02 NOTE — Progress Notes (Signed)
   Subjective:    Patient ID: Isabel Atkinson, female    DOB: 11/16/1965, 49 y.o.   MRN: [id number]  Dizziness This is a new problem. The current episode started more than 1 month ago. The problem has been rapidly improving. Pertinent negatives include no chest pain, congestion or coughing.    Patient in today for a one month follow up for dizziness.  Patient admits to being stressed admits to having panic attacks where she'll feel like her heart running fast she has to take deep breaths and feels very nervous but then it goes away her heart rate is only gotten up into the 1 08-110 range States no other concerns this visit.  Review of Systems  HENT: Negative for congestion.   Respiratory: Negative for cough and shortness of breath.   Cardiovascular: Positive for palpitations. Negative for chest pain.  Neurological: Positive for dizziness.       Objective:   Physical Exam Lungs are clear hearts regular pulse normal no arrhythmia heard BP good extremities no new edema neurologic grossly normal  Patient denies being depressed     Assessment & Plan:  Occasional palpitations probably related to panic attacks and stress stress relief techniques discuss Xanax use sparingly low-dose Celexa 10 mg daily if any problems with the medicine stop medicine and notify us. Otherwise follow-up again in approximately 3-4 months. If becomes depressed or becomes desponded recommend follow-up right away

## 2015-06-16 ENCOUNTER — Emergency Department (HOSPITAL_COMMUNITY)
Admission: EM | Admit: 2015-06-16 | Discharge: 2015-06-16 | Disposition: A | Discharge status: Home / Self Care | Payer: BC Managed Care – PPO | Attending: Emergency Medicine | Admitting: Emergency Medicine

## 2015-06-16 ENCOUNTER — Encounter (HOSPITAL_COMMUNITY): Payer: Self-pay | Admitting: Emergency Medicine

## 2015-06-16 ENCOUNTER — Emergency Department (HOSPITAL_COMMUNITY): Payer: BC Managed Care – PPO

## 2015-06-16 DIAGNOSIS — J45901 Unspecified asthma with (acute) exacerbation: Secondary | ICD-10-CM | POA: Diagnosis not present

## 2015-06-16 DIAGNOSIS — R000 Tachycardia, unspecified: Secondary | ICD-10-CM | POA: Diagnosis not present

## 2015-06-16 DIAGNOSIS — F419 Anxiety disorder, unspecified: Secondary | ICD-10-CM

## 2015-06-16 DIAGNOSIS — Z720 Tobacco use: Secondary | ICD-10-CM | POA: Diagnosis not present

## 2015-06-16 DIAGNOSIS — Z79899 Other long term (current) drug therapy: Secondary | ICD-10-CM | POA: Diagnosis not present

## 2015-06-16 DIAGNOSIS — E876 Hypokalemia: Secondary | ICD-10-CM | POA: Diagnosis not present

## 2015-06-16 DIAGNOSIS — F410 Panic disorder [episodic paroxysmal anxiety] without agoraphobia: Secondary | ICD-10-CM | POA: Diagnosis not present

## 2015-06-16 DIAGNOSIS — R0602 Shortness of breath: Secondary | ICD-10-CM | POA: Diagnosis present

## 2015-06-16 LAB — BASIC METABOLIC PANEL
ANION GAP: 8 (ref 5–15)
ANION GAP: 3 — AB (ref 5–15)
BUN: 14 mg/dL (ref 6–20)
BUN: 14 mg/dL (ref 6–20)
CALCIUM: 8.6 mg/dL — AB (ref 8.9–10.3)
CALCIUM: 7.7 mg/dL — AB (ref 8.9–10.3)
CO2: 25 mmol/L (ref 22–32)
CO2: 26 mmol/L (ref 22–32)
Chloride: 107 mmol/L (ref 101–111)
Chloride: 111 mmol/L (ref 101–111)
Creatinine, Ser: 0.66 mg/dL (ref 0.44–1.00)
Creatinine, Ser: 0.63 mg/dL (ref 0.44–1.00)
GFR calc Af Amer: >60 mL/min (ref >60)
GFR calc Af Amer: >60 mL/min (ref >60)
GFR calc non Af Amer: >60 mL/min (ref >60)
Glucose, Bld: 153 mg/dL — ABNORMAL HIGH (ref 65–99)
Glucose, Bld: 121 mg/dL — ABNORMAL HIGH (ref 65–99)
POTASSIUM: 2.9 mmol/L — AB (ref 3.5–5.1)
Potassium: 3 mmol/L — ABNORMAL LOW (ref 3.5–5.1)
Sodium: 140 mmol/L (ref 135–145)
Sodium: 140 mmol/L (ref 135–145)

## 2015-06-16 LAB — CBC WITH DIFFERENTIAL/PLATELET
BASOS ABS: 0 10*3/uL (ref 0.0–0.1)
BASOS PCT: 0 %
Eosinophils Absolute: 0.3 10*3/uL (ref 0.0–0.7)
Eosinophils Relative: 4 %
HEMATOCRIT: 36.9 % (ref 36.0–46.0)
HEMOGLOBIN: 12.2 g/dL (ref 12.0–15.0)
LYMPHS PCT: 17 %
Lymphs Abs: 1.3 10*3/uL (ref 0.7–4.0)
MCH: 30.1 pg (ref 26.0–34.0)
MCHC: 33.1 g/dL (ref 30.0–36.0)
MCV: 91.1 fL (ref 78.0–100.0)
Monocytes Absolute: 0.7 10*3/uL (ref 0.1–1.0)
Monocytes Relative: 9 %
NEUTROS ABS: 5.3 10*3/uL (ref 1.7–7.7)
NEUTROS PCT: 70 %
Platelets: 222 10*3/uL (ref 150–400)
RBC: 4.05 MIL/uL (ref 3.87–5.11)
RDW: 13.4 % (ref 11.5–15.5)
WBC: 7.6 10*3/uL (ref 4.0–10.5)

## 2015-06-16 LAB — I-STAT TROPONIN, ED: Troponin i, poc: 0 ng/mL (ref 0.00–0.08)

## 2015-06-16 LAB — CBG MONITORING, ED: GLUCOSE-CAPILLARY: 129 mg/dL — AB (ref 65–99)

## 2015-06-16 MED ORDER — SODIUM CHLORIDE 0.9 % IV BOLUS (SEPSIS)
250.0000 mL | Freq: Once | INTRAVENOUS | Status: AC
  Administered 2015-06-16: 250 mL via INTRAVENOUS

## 2015-06-16 MED ORDER — SODIUM CHLORIDE 0.9 % IV SOLN
INTRAVENOUS | Status: DC
  Administered 2015-06-16: 22:00:00 via INTRAVENOUS

## 2015-06-16 MED ORDER — LORAZEPAM 2 MG/ML IJ SOLN
1.0000 mg | Freq: Once | INTRAMUSCULAR | Status: AC
  Administered 2015-06-16: 1 mg via INTRAVENOUS
  Filled 2015-06-16: qty 1

## 2015-06-16 MED ORDER — SODIUM CHLORIDE 0.9 % IV SOLN: INTRAVENOUS | Status: DC

## 2015-06-16 MED ORDER — POTASSIUM CHLORIDE CRYS ER 20 MEQ PO TBCR
40.0000 meq | EXTENDED_RELEASE_TABLET | Freq: Once | ORAL | Status: AC
  Administered 2015-06-16: 40 meq via ORAL
  Filled 2015-06-16: qty 2

## 2015-06-16 MED ORDER — ASPIRIN 81 MG PO CHEW
324.0000 mg | CHEWABLE_TABLET | Freq: Once | ORAL | Status: AC
  Administered 2015-06-16: 324 mg via ORAL
  Filled 2015-06-16: qty 4

## 2015-06-16 MED ORDER — SODIUM CHLORIDE 0.9 % IV BOLUS (SEPSIS)
1000.0000 mL | Freq: Once | INTRAVENOUS | Status: AC
  Administered 2015-06-16: 1000 mL via INTRAVENOUS

## 2015-06-16 MED ORDER — POTASSIUM CHLORIDE ER 10 MEQ PO TBCR: 10.0000 meq | EXTENDED_RELEASE_TABLET | Freq: Two times a day (BID) | ORAL | Status: DC

## 2015-06-16 MED ORDER — ASPIRIN 81 MG PO CHEW
CHEWABLE_TABLET | ORAL | Status: AC
  Filled 2015-06-16: qty 1

## 2015-06-16 NOTE — Discharge Instructions (Signed)
Hypokalemia Hypokalemia means that the amount of potassium in the blood is lower than normal.Potassium is a chemical, called an electrolyte, that helps regulate the amount of fluid in the body. It also stimulates muscle contraction and helps nerves function properly.Most of the body's potassium is inside of cells, and only a very small amount is in the blood. Because the amount in the blood is so small, minor changes can be life-threatening. CAUSES  Antibiotics.  Diarrhea or vomiting.  Using laxatives too much, which can cause diarrhea.  Chronic kidney disease.  Water pills (diuretics).  Eating disorders (bulimia).  Low magnesium level.  Sweating a lot. SIGNS AND SYMPTOMS  Weakness.  Constipation.  Fatigue.  Muscle cramps.  Mental confusion.  Skipped heartbeats or irregular heartbeat (palpitations).  Tingling or numbness. DIAGNOSIS  Your health care provider can diagnose hypokalemia with blood tests. In addition to checking your potassium level, your health care provider may also check other lab tests. TREATMENT Hypokalemia can be treated with potassium supplements taken by mouth or adjustments in your current medicines. If your potassium level is very low, you may need to get potassium through a vein (IV) and be monitored in the hospital. A diet high in potassium is also helpful. Foods high in potassium are:  Nuts, such as peanuts and pistachios.  Seeds, such as sunflower seeds and pumpkin seeds.  Peas, lentils, and lima beans.  Whole grain and bran cereals and breads.  Fresh fruit and vegetables, such as apricots, avocado, bananas, cantaloupe, kiwi, oranges, tomatoes, asparagus, and potatoes.  Orange and tomato juices.  Red meats.  Fruit yogurt. HOME CARE INSTRUCTIONS  Take all medicines as prescribed by your health care provider.  Maintain a healthy diet by including nutritious food, such as fruits, vegetables, nuts, whole grains, and lean meats.  If  you are taking a laxative, be sure to follow the directions on the label. SEEK MEDICAL CARE IF:  Your weakness gets worse.  You feel your heart pounding or racing.  You are vomiting or having diarrhea.  You are diabetic and having trouble keeping your blood glucose in the normal range. SEEK IMMEDIATE MEDICAL CARE IF:  You have chest pain, shortness of breath, or dizziness.  You are vomiting or having diarrhea for more than 2 days.  You faint. MAKE SURE YOU:   Understand these instructions.  Will watch your condition.  Will get help right away if you are not doing well or get worse.   This information is not intended to replace advice given to you by your health care provider. Make sure you discuss any questions you have with your health care provider.   Document Released: 07/26/2005 Document Revised: 08/16/2014 Document Reviewed: 01/26/2013 Elsevier Interactive Patient Education 2016 Else[employer]  Panic Attacks Panic attacks are sudden, short feelings of great fear or discomfort. You may have them for no reason when you are relaxed, when you are uneasy (anxious), or when you are sleeping.  HOME CARE  Take all your medicines as told.  Check with your doctor before starting new medicines.  Keep all doctor visits. GET HELP IF:  You are not able to take your medicines as told.  Your symptoms do not get better.  Your symptoms get worse. GET HELP RIGHT AWAY IF:  Your attacks seem different than your normal attacks.  You have thoughts about hurting yourself or others.  You take panic attack medicine and you have a side effect. MAKE SURE YOU:  Understand these instructions.  Will  watch your condition.  Will get help right away if you are not doing well or get worse.   This information is not intended to replace advice given to you by your health care provider. Make sure you discuss any questions you have with your health care provider.  Take the potassium as  directed for the next 2 days. Workup showed some evidence of some mild the low potassium. Make an appointment to follow-up with your regular doctor for recheck. Continue the medications as prescribed per Dr. Wolfgang Phoenix for the anxiety and panic attacks. Return for any new or worse symptoms.   Document Released: 08/28/2010 Document Revised: 05/16/2013 Document Reviewed: 03/09/2013 Elsevier Interactive Patient Education [employer].

## 2015-06-16 NOTE — ED Notes (Addendum)
CRITICAL VALUE ALERT  Critical value received:  Glucose <20 mg/dl  Date of notification:  06/16/15  Time of notification:  2115  Critical value read back:Yes.    Nurse who received alert:  Y. Verl Blalock, RN  MD notified (1st page):  Dr. Helane Gunther  Time of first page:  2116  Responding MD:  Dr. Helane Gunther  Time MD responded:  2117

## 2015-06-16 NOTE — ED Provider Notes (Signed)
CSN: [id number]     Arrival date & time 06/16/15  1926 History   First MD Initiated Contact with Patient 06/16/15 1930     Chief Complaint  Patient presents with  . Chest Pain     (Consider location/radiation/quality/duration/timing/severity/associated sxs/prior Treatment) Patient is a 49 y.o. female presenting with chest pain. The history is provided by the patient and the spouse.  Chest Pain Associated symptoms: shortness of breath   Associated symptoms: no abdominal pain, no back pain, no fever, no headache, no nausea and not vomiting    patient was sudden onset just prior to arrival of tingling in the chest radiating to both arms. Patient feeling like heart rate was going fast. Associated with some mild shortness of breath. History of similar problem recently seen by primary care doctor thought to be panic attack related and started on medications for this. Patient denies any true chest pain pressure discomfort or tightness. No nausea no vomiting no abdominal pain no back pain. Oxygen saturation here event 100% on room air. Patient noted to be tachycardic in triage.  Past Medical History  Diagnosis Date  . Anxiety   . Reactive airway disease   . Pre-diabetes    Past Surgical History  Procedure Laterality Date  . Tubal ligation  2000  . Ovarian cyst surgery  1997    removed  . Neck surgery  10/2012  . Esophagogastroduodenoscopy  2013    RMR: Possible cervical esophageal web-status post dilation as described above. Small hiatal hernia. Status post biopsy of normal appearing small bowel to screen for celiac disease.   . Colonoscopy  2013    RMR: Normal rectum. single diminutive sigmoid polyp noted above. Normal terminal ileum. Status post segmental biopsy.    Family History  Problem Relation Age of Onset  . Colon cancer Maternal Grandfather 28  . Colon polyps Mother 89  . Colon cancer Maternal Uncle 57  . Hypertension Father   . Diabetes Father   . Heart disease Father   .  Hyperlipidemia Father    Social History  Substance Use Topics  . Smoking status: Current Every Day Smoker -- 1.00 packs/day for 25 years    Types: Cigarettes  . Smokeless tobacco: Never Used  . Alcohol Use: No   OB History    No data available     Review of Systems  Constitutional: Negative for fever.  HENT: Negative for congestion.   Eyes: Negative for visual disturbance.  Respiratory: Positive for shortness of breath. Negative for chest tightness.   Cardiovascular: Negative for chest pain.  Gastrointestinal: Negative for nausea, vomiting and abdominal pain.  Genitourinary: Negative for dysuria.  Musculoskeletal: Negative for back pain and neck pain.  Skin: Negative for rash.  Neurological: Negative for headaches.  Hematological: Does not bruise/bleed easily.  Psychiatric/Behavioral: The patient is nervous/anxious.       Allergies  Sulfa antibiotics and Fosamax  Home Medications   Prior to Admission medications   Medication Sig Start Date End Date Taking? Authorizing Provider  ALPRAZolam (XANAX) 0.25 MG tablet TAKE 1/2 TO 1 TABLET BY MOUTH TWICE DAILY AS NEEDED FOR ANXIETY. Patient taking differently: Take 0.125 mg by mouth 2 (two) times daily as needed for anxiety or sleep. TAKE 1/2 TO 1 TABLET BY MOUTH TWICE DAILY AS NEEDED FOR ANXIETY 06/02/15  Yes Kathyrn Drown, MD  Calcium-Vitamin D-Vitamin K (CALCIUM SOFT CHEWS) 500-500-40 MG-UNT-MCG CHEW Chew 2 each by mouth daily.   Yes Historical Provider, MD  citalopram (CELEXA) 10  MG tablet Take 1 tablet (10 mg total) by mouth daily. Patient taking differently: Take 5 mg by mouth daily.  06/02/15 06/01/16 Yes Kathyrn Drown, MD  cycloSPORINE (RESTASIS) 0.05 % ophthalmic emulsion Place 1 drop into both eyes 2 (two) times daily. For dry eyes   Yes Historical Provider, MD  Dexlansoprazole (DEXILANT) 30 MG capsule Take 1 capsule (30 mg total) by mouth daily. 04/24/15  Yes Nilda Simmer, NP  Ibandronate Sodium (BONIVA IV) Inject  into the vein every 3 (three) months.   Yes Historical Provider, MD  nystatin (MYCOSTATIN) 100000 UNIT/ML suspension SWISH AND SPIT ONE TEASPOONFUL BY MOUTH FOUR TIMES DAILY FOR 7 DAYS. Patient not taking: Reported on 06/02/2015 04/09/15   Mikey Kirschner, MD  potassium chloride (K-DUR) 10 MEQ tablet Take 1 tablet (10 mEq total) by mouth 2 (two) times daily. 06/16/15   Fredia Sorrow, MD  Probiotic Product (ALIGN PO) Take 1 capsule by mouth daily.     Historical Provider, MD  valACYclovir (VALTREX) 1000 MG tablet Take 2 tabs po BID for 1 day for cold sores Patient not taking: Reported on 06/02/2015 08/14/14   Kathyrn Drown, MD   BP 110/56 mmHg  Pulse 83  Temp(Src) 98.6 F (37 C) (Oral)  Resp 18  Ht 5\' 2"  (1.575 m)  Wt 103 lb (46.72 kg)  BMI 18.83 kg/m2  SpO2 98% Physical Exam  Constitutional: She is oriented to person, place, and time. She appears well-developed and well-nourished. She appears distressed.  HENT:  Head: Normocephalic and atraumatic.  Eyes: Conjunctivae and EOM are normal. Pupils are equal, round, and reactive to light.  Neck: Normal range of motion. Neck supple.  Cardiovascular: Regular rhythm and normal heart sounds.   No murmur heard. Tachycardic  Pulmonary/Chest: Effort normal and breath sounds normal. No respiratory distress.  Abdominal: Soft. Bowel sounds are normal. There is no tenderness.  Musculoskeletal: Normal range of motion. She exhibits no edema.  Neurological: She is alert and oriented to person, place, and time. No cranial nerve deficit. She exhibits normal muscle tone. Coordination normal.  Skin: Skin is warm. No rash noted.  Nursing note and vitals reviewed.   ED Course  Procedures (including critical care time) Labs Review Labs Reviewed  BASIC METABOLIC PANEL - Abnormal; Notable for the following:    Potassium 2.9 (*)    Glucose, Bld 153 (*)    Calcium 8.6 (*)    All other components within normal limits  BASIC METABOLIC PANEL - Abnormal;  Notable for the following:    Potassium 3.0 (*)    Glucose, Bld 121 (*)    Calcium 7.7 (*)    Anion gap 3 (*)    All other components within normal limits  CBG MONITORING, ED - Abnormal; Notable for the following:    Glucose-Capillary 129 (*)    All other components within normal limits  CBC WITH DIFFERENTIAL/PLATELET  I-STAT TROPOININ, ED   Results for orders placed or performed during the hospital encounter of 06/16/15  CBC with Differential/Platelet  Result Value Ref Range   WBC 7.6 4.0 - 10.5 K/uL   RBC 4.05 3.87 - 5.11 MIL/uL   Hemoglobin 12.2 12.0 - 15.0 g/dL   HCT 36.9 36.0 - 46.0 %   MCV 91.1 78.0 - 100.0 fL   MCH 30.1 26.0 - 34.0 pg   MCHC 33.1 30.0 - 36.0 g/dL   RDW 13.4 11.5 - 15.5 %   Platelets 222 150 - 400 K/uL   Neutrophils  Relative % 70 %   Neutro Abs 5.3 1.7 - 7.7 K/uL   Lymphocytes Relative 17 %   Lymphs Abs 1.3 0.7 - 4.0 K/uL   Monocytes Relative 9 %   Monocytes Absolute 0.7 0.1 - 1.0 K/uL   Eosinophils Relative 4 %   Eosinophils Absolute 0.3 0.0 - 0.7 K/uL   Basophils Relative 0 %   Basophils Absolute 0.0 0.0 - 0.1 K/uL  Basic metabolic panel  Result Value Ref Range   Sodium 140 135 - 145 mmol/L   Potassium 2.9 (L) 3.5 - 5.1 mmol/L   Chloride 107 101 - 111 mmol/L   CO2 25 22 - 32 mmol/L   Glucose, Bld 153 (H) 65 - 99 mg/dL   BUN 14 6 - 20 mg/dL   Creatinine, Ser 0.66 0.44 - 1.00 mg/dL   Calcium 8.6 (L) 8.9 - 10.3 mg/dL   GFR calc non Af Amer >60 >60 mL/min   GFR calc Af Amer >60 >60 mL/min   Anion gap 8 5 - 15  Basic metabolic panel  Result Value Ref Range   Sodium 140 135 - 145 mmol/L   Potassium 3.0 (L) 3.5 - 5.1 mmol/L   Chloride 111 101 - 111 mmol/L   CO2 26 22 - 32 mmol/L   Glucose, Bld 121 (H) 65 - 99 mg/dL   BUN 14 6 - 20 mg/dL   Creatinine, Ser 0.63 0.44 - 1.00 mg/dL   Calcium 7.7 (L) 8.9 - 10.3 mg/dL   GFR calc non Af Amer >60 >60 mL/min   GFR calc Af Amer >60 >60 mL/min   Anion gap 3 (L) 5 - 15  I-Stat Troponin, ED (not at [facility])   Result Value Ref Range   Troponin i, poc 0.00 0.00 - 0.08 ng/mL   Comment 3          CBG monitoring, ED  Result Value Ref Range   Glucose-Capillary 129 (H) 65 - 99 mg/dL     Imaging Review Dg Chest 2 View  06/16/2015  CLINICAL DATA:  Chest pain. EXAM: CHEST  2 VIEW COMPARISON:  04/24/2015 FINDINGS: The lungs are hyperinflated with emphysematous change. Diminished patchy nodular airspace opacity in the right midlung zone from prior. No new consolidation. Cardiomediastinal contours are normal. No pleural effusion or pneumothorax. Postsurgical change in the cervical spine and thoracic inlet. No acute osseous abnormalities. IMPRESSION: Stable hyperinflation and emphysema.  No superimposed acute process. Electronically Signed   By: Jeb Levering M.D.   On: 06/16/2015 21:10   I have personally reviewed and evaluated these images and lab results as part of my medical decision-making.   EKG Interpretation   Date/Time:  Monday June 16 2015 19:33:07 EST Ventricular Rate:  126 PR Interval:  125 QRS Duration: 88 QT Interval:  338 QTC Calculation: 489 R Axis:   77 Text Interpretation:  Sinus tachycardia Ventricular premature complex  Aberrant conduction of SV complex(es) Consider right atrial enlargement ST  depr, consider ischemia, inferior leads Borderline prolonged QT interval  No previous ECGs available Confirmed by Yasseen Salls  MD, Leighla Chestnutt [phone]) on  06/16/2015 7:34:52 PM      MDM   Final diagnoses:  Anxiety  Panic attack  Hypokalemia   Patient presented with tingling in the chest and both arms. Similar symptoms before primary care doctor thought it may be anxiety panic attack. Patient never had any chest pain. Workup here however for potential chest pain equivalent without any acute findings. Chest x-ray negative for acute process  no pneumonia. EKG showed a sinus tachycardia at 126. Troponin was negative. CBC without anemia or leukocytosis. Electrolytes only significant for a  mild hypokalemia. Patient received 40 potassium here and we continue on potassium for the next 2 days. Patient early on was given Ativan and IV fluids with resolution of the tachycardia and improvement in symptoms. Feel that it is related to anxiety and probably there was a panic attack component. Patient stable for discharge home and follow-up with her primary care doctor.   Fredia Sorrow, MD 06/16/15 2308

## 2015-06-16 NOTE — ED Notes (Addendum)
Patient states "I was laying down and started feeling a flushed feeling in my chest and both my arms felt tingly." States she had same symptoms two weeks ago and was seen by Dr Wolfgang Phoenix and treated for panic attacks. States she took two baby aspirin prior to arrival and symptoms have resolved. During triage and EKG patient states symptoms have returned.

## 2015-06-17 ENCOUNTER — Ambulatory Visit (INDEPENDENT_AMBULATORY_CARE_PROVIDER_SITE_OTHER): Payer: BC Managed Care – PPO | Admitting: Family Medicine

## 2015-06-17 ENCOUNTER — Encounter: Payer: Self-pay | Admitting: Family Medicine

## 2015-06-17 VITALS — BP 152/90 | HR 106 | Temp 100.3°F | Ht 62.0 in | Wt 104.0 lb

## 2015-06-17 DIAGNOSIS — R000 Tachycardia, unspecified: Secondary | ICD-10-CM

## 2015-06-17 DIAGNOSIS — R300 Dysuria: Secondary | ICD-10-CM | POA: Diagnosis not present

## 2015-06-17 DIAGNOSIS — M810 Age-related osteoporosis without current pathological fracture: Secondary | ICD-10-CM | POA: Diagnosis not present

## 2015-06-17 DIAGNOSIS — B349 Viral infection, unspecified: Secondary | ICD-10-CM

## 2015-06-17 LAB — POCT URINALYSIS DIPSTICK
Blood, UA: 250
PH UA: 7
Spec Grav, UA: <=1.005

## 2015-06-17 MED ORDER — CEFPROZIL 250 MG PO TABS: 250.0000 mg | ORAL_TABLET | Freq: Two times a day (BID) | ORAL | Status: DC

## 2015-06-17 NOTE — Progress Notes (Signed)
   Subjective:    Patient ID: Isabel Atkinson, female    DOB: 1966-02-19, 49 y.o.   MRN: [id number]  HPIFollow up er visit for increase heart rate. Went to ed last night. Both arms were tingling. Pulse today 106. Taking xanax. Patient states she was laying on the couch she suddenly had an onset of heart rate in the 150s made her feel bad all over it sustained for at least 10 minutes but then dropped down to the 120s by the time she went to the ER she did take a Xanax it seemed to help she states there was nothing going on at that time that triggered this. This is the third such spell she has had in the past few weeks she does state that she had a panic attacks several years back. Recently she was seen in started on Celexa and Xanax for possibility of panic attacks  Patient also has osteoporosis she is on IV therapy for this. Could not tolerate oral. She still smokes she is been she does take calcium and vitamin D she is due for a bone density  ER showed low potassium low calcium we will repeat lab work to look at this closer. Patient taking potassium supplements tonight  Low grade fever. 100.3 today. Patient relates some head congestion drainage coughing. She does smoke she denies shortness of breath. Symptoms been present over the past 1-2 days around her granddaughter who was also sick  Pt wanted urine checked today.  Irritation when urination.    25 minutes was spent with the patient. Greater than half the time was spent in discussion and answering questions and counseling regarding the issues that the patient came in for today.  Review of Systems Patient denies high fevers but relating low fever Relating some coughing head congestion denies chest pressure tightness denies abdominal pain no vomiting or diarrhea denies heat or cold intolerance denies weight loss denies excessive thirst or urination    Objective:   Physical Exam Neck is supple throat normal eardrums normal lungs are clear no  crackles heart regular pulse normal   Urinalysis negative    Assessment & Plan:  Tachycardia-concerning for the possibility of underlying arrhythmia issue. Recommend cardiology consultation as well as patient would benefit from event monitor. 2 week versus 30 day patient was counseled if she has sustained tachycardia and 150 range her longer for more than 10 minutes to go to the ER. Given sudden onset of heart rate in the 150s I find it unlikely that this is due to panic attacks  Stress levels elevated continue Celexa increase 10 mg daily Xanax on a when necessary basis  Recent hypo-Kaylee anemia hypocalcemia check lab work on Thursday patient taking potassium supplements tonight  Patient was counseled to quit smoking unlikely she will be able to do so in the near future  Osteoporosis on treatment for this patient needs a bone density test it has been 2 years

## 2015-06-18 ENCOUNTER — Telehealth: Payer: Self-pay | Admitting: *Deleted

## 2015-06-18 NOTE — Telephone Encounter (Signed)
Notified patient bone density test is scheduled for Nov 17th register 3:15 at [facility]. Bring list of meds and no calcium 24 -48 before test. Patient agreed and verbalized understanding.

## 2015-06-18 NOTE — Telephone Encounter (Signed)
[facility] to let pt know when bone density test is scheduled. Nov 17th register 3:15 at [facility]. Bring list of meds and no calcium 24 -48 before test. Pt wanted as late as possible but 3:15 is the latest they do bone density test. Pt can reschedule if she needs to

## 2015-06-20 LAB — BASIC METABOLIC PANEL
BUN / CREAT RATIO: 29 — AB (ref 9–23)
BUN: 16 mg/dL (ref 6–24)
CHLORIDE: 99 mmol/L (ref 97–106)
CO2: 26 mmol/L (ref 18–29)
Calcium: 9.5 mg/dL (ref 8.7–10.2)
Creatinine, Ser: 0.56 mg/dL — ABNORMAL LOW (ref 0.57–1.00)
GFR, EST AFRICAN AMERICAN: 125 mL/min/{1.73_m2} (ref >59)
GFR, EST NON AFRICAN AMERICAN: 108 mL/min/{1.73_m2} (ref >59)
Glucose: 102 mg/dL — ABNORMAL HIGH (ref 65–99)
POTASSIUM: 3.8 mmol/L (ref 3.5–5.2)
Sodium: 139 mmol/L (ref 136–144)

## 2015-06-20 LAB — VITAMIN D 25 HYDROXY (VIT D DEFICIENCY, FRACTURES): Vit D, 25-Hydroxy: 31.9 ng/mL (ref 30.0–100.0)

## 2015-06-22 ENCOUNTER — Encounter: Payer: Self-pay | Admitting: Family Medicine

## 2015-06-26 ENCOUNTER — Ambulatory Visit (HOSPITAL_COMMUNITY)
Admission: RE | Admit: 2015-06-26 | Discharge: 2015-06-26 | Disposition: A | Discharge status: Home / Self Care | Payer: BC Managed Care – PPO | Source: Ambulatory Visit | Attending: Family Medicine | Admitting: Family Medicine

## 2015-06-26 DIAGNOSIS — Z780 Asymptomatic menopausal state: Secondary | ICD-10-CM | POA: Diagnosis not present

## 2015-06-26 DIAGNOSIS — M810 Age-related osteoporosis without current pathological fracture: Secondary | ICD-10-CM | POA: Diagnosis not present

## 2015-06-26 DIAGNOSIS — M8580 Other specified disorders of bone density and structure, unspecified site: Secondary | ICD-10-CM | POA: Diagnosis not present

## 2015-06-27 ENCOUNTER — Other Ambulatory Visit: Payer: Self-pay | Admitting: Family Medicine

## 2015-06-27 ENCOUNTER — Encounter: Payer: Self-pay | Admitting: Family Medicine

## 2015-06-27 DIAGNOSIS — Z1231 Encounter for screening mammogram for malignant neoplasm of breast: Secondary | ICD-10-CM

## 2015-06-30 ENCOUNTER — Encounter: Payer: Self-pay | Admitting: Internal Medicine

## 2015-06-30 ENCOUNTER — Ambulatory Visit (INDEPENDENT_AMBULATORY_CARE_PROVIDER_SITE_OTHER): Payer: BC Managed Care – PPO | Admitting: Internal Medicine

## 2015-06-30 VITALS — BP 112/72 | HR 91 | Ht 62.0 in | Wt 103.0 lb

## 2015-06-30 DIAGNOSIS — R002 Palpitations: Secondary | ICD-10-CM | POA: Diagnosis not present

## 2015-06-30 NOTE — Progress Notes (Signed)
Cardiology Office Note   Date:  06/30/2015   ID:  CARSON OPFER, DOB [birth year], MRN [id number]  PCP:  Sallee Lange, MD  Cardiologist:   Dorris Carnes, MD   Pt presnets for eval of palpitations      History of Present Illness: Isabel Atkinson is a 49 y.o. female with a history of palpitations Sept 23 got up  Fixing sons lunch  Felt jittery  HR 150  Stayed that way for a few min  Then went away  Started several min later   120s then went away November 7 was laying on sofa  Felt funny in chest  Everything felt funny in chest  HR up t o150   Didn't go down  Ate Aspirn  Felt tingly      On days not having feels fine  Not dizzy  No CP  Active      Current Outpatient Prescriptions  Medication Sig Dispense Refill  . ALPRAZolam (XANAX) 0.25 MG tablet TAKE 1/2 TO 1 TABLET BY MOUTH TWICE DAILY AS NEEDED FOR ANXIETY. (Patient taking differently: Take 0.125 mg by mouth 2 (two) times daily as needed for anxiety or sleep. TAKE 1/2 TO 1 TABLET BY MOUTH TWICE DAILY AS NEEDED FOR ANXIETY) 30 tablet 2  . Calcium-Vitamin D-Vitamin K (CALCIUM SOFT CHEWS) [id number] MG-UNT-MCG CHEW Chew 2 each by mouth daily.    . cycloSPORINE (RESTASIS) 0.05 % ophthalmic emulsion Place 1 drop into both eyes 2 (two) times daily. For dry eyes    . Dexlansoprazole (DEXILANT) 30 MG capsule Take 1 capsule (30 mg total) by mouth daily. 30 capsule 11  . Ibandronate Sodium (BONIVA IV) Inject into the vein every 3 (three) months.    . valACYclovir (VALTREX) 1000 MG tablet Take 2 tabs po BID for 1 day for cold sores 4 tablet 12   No current facility-administered medications for this visit.    Allergies:   Sulfa antibiotics and Fosamax   Past Medical History  Diagnosis Date  . Anxiety   . Reactive airway disease   . Pre-diabetes     Past Surgical History  Procedure Laterality Date  . Tubal ligation  2000  . Ovarian cyst surgery  1997    removed  . Neck surgery  10/2012  . Esophagogastroduodenoscopy  2013   RMR: Possible cervical esophageal web-status post dilation as described above. Small hiatal hernia. Status post biopsy of normal appearing small bowel to screen for celiac disease.   . Colonoscopy  2013    RMR: Normal rectum. single diminutive sigmoid polyp noted above. Normal terminal ileum. Status post segmental biopsy.      Social History:  The patient  reports that she has been smoking Cigarettes.  She has a 25 pack-year smoking history. She has never used smokeless tobacco. She reports that she does not drink alcohol or use illicit drugs.   Family History:  The patient's family history includes Colon cancer (age of onset: 67) in her maternal uncle; Colon cancer (age of onset: 26) in her maternal grandfather; Colon polyps (age of onset: 73) in her mother; Diabetes in her father; Heart disease in her father; Hyperlipidemia in her father; Hypertension in her father.    ROS:  Please see the history of present illness. All other systems are reviewed and  Negative to the above problem except as noted.    PHYSICAL EXAM: VS:  BP 112/72 mmHg  Pulse 91  Ht 5\' 2"  (1.575 m)  Wt 46.72 kg (103  lb)  BMI 18.83 kg/m2  SpO2 99%  GEN: Well nourished, well developed, in no acute distress HEENT: normal Neck: no JVD, carotid bruits, or masses Cardiac: RRR; no murmurs, rubs, or gallops,no edema  Respiratory:  clear to auscultation bilaterally, normal work of breathing GI: soft, nontender, nondistended, + BS  No hepatomegaly  MS: no deformity Moving all extremities   Skin: warm and dry, no rash Neuro:  Strength and sensation are intact Psych: euthymic mood, full affect   EKG:  EKG isnot  ordered today.  On 11/7 EKG showed ST 127 bpm     Lipid Panel    Component Value Date/Time   CHOL 191 04/25/2015 0928   CHOL 151 11/03/2013 0951   TRIG 89 04/25/2015 0928   HDL 76 04/25/2015 0928   HDL 60 11/03/2013 0951   CHOLHDL 2.5 04/25/2015 0928   CHOLHDL 2.5 11/03/2013 0951   VLDL 13 11/03/2013 0951     LDLCALC 97 04/25/2015 0928   LDLCALC 78 11/03/2013 0951      Wt Readings from Last 3 Encounters:  06/30/15 46.72 kg (103 lb)  06/17/15 47.174 kg (104 lb)  06/16/15 46.72 kg (103 lb)      ASSESSMENT AND PLAN:  1  Palpitations  Intermittent  No arrhythmia documented  Thyroid normal  Spells do not appear to be hemodynamically destabilizing   Would recomm event monitor  To try to capture Continue activities as tolerated   Stay hydrated   2.  Anxiety  Continues xanax prn.  Not all spells of palp occur when she is anxious.  Follow    F/U based on results     Signed, Dorris Carnes, MD  06/30/2015 1:51 PM    [facility] [address], [place], [place]  [address] Phone: [phone]; Fax: [phone]

## 2015-06-30 NOTE — Patient Instructions (Addendum)
Your physician recommends that you schedule a follow-up appointment As Needed  Your physician recommends that you continue on your current medications as directed. Please refer to the Current Medication list given to you today.  Your physician has recommended that you wear an event monitor. That will be mailed to you.  Event monitors are medical devices that record the heart's electrical activity. Doctors most often [place] these monitors to diagnose arrhythmias. Arrhythmias are problems with the speed or rhythm of the heartbeat. The monitor is a small, portable device. You can wear one while you do your normal daily activities. This is usually used to diagnose what is causing palpitations/syncope (passing out).   If you need a refill on your cardiac medications before your next appointment, please call your pharmacy.  Thank you for choosing [facility]!

## 2015-07-08 ENCOUNTER — Encounter (INDEPENDENT_AMBULATORY_CARE_PROVIDER_SITE_OTHER): Payer: BC Managed Care – PPO

## 2015-07-08 ENCOUNTER — Encounter: Payer: Self-pay | Admitting: Family Medicine

## 2015-07-08 ENCOUNTER — Ambulatory Visit (INDEPENDENT_AMBULATORY_CARE_PROVIDER_SITE_OTHER): Payer: BC Managed Care – PPO | Admitting: Family Medicine

## 2015-07-08 DIAGNOSIS — B9689 Other specified bacterial agents as the cause of diseases classified elsewhere: Secondary | ICD-10-CM

## 2015-07-08 DIAGNOSIS — J019 Acute sinusitis, unspecified: Secondary | ICD-10-CM | POA: Diagnosis not present

## 2015-07-08 DIAGNOSIS — R002 Palpitations: Secondary | ICD-10-CM | POA: Diagnosis not present

## 2015-07-08 MED ORDER — DOXYCYCLINE HYCLATE 100 MG PO CAPS: 100.0000 mg | ORAL_CAPSULE | Freq: Two times a day (BID) | ORAL | Status: DC

## 2015-07-08 NOTE — Progress Notes (Signed)
   Subjective:    Patient ID: Isabel Atkinson, female    DOB: 1966-07-23, 49 y.o.   MRN: [id number]  Cough This is a new problem. Associated symptoms include a fever, headaches, myalgias, nasal congestion and rhinorrhea. Pertinent negatives include no chest pain, ear pain, shortness of breath or wheezing.   Patient with a lot of head congestion drainage coughing not feeling good symptoms over the past week and half progressive symptoms. PMH benign   Review of Systems  Constitutional: Positive for fever. Negative for activity change.  HENT: Positive for congestion and rhinorrhea. Negative for ear pain.   Eyes: Negative for discharge.  Respiratory: Positive for cough. Negative for shortness of breath and wheezing.   Cardiovascular: Negative for chest pain.  Musculoskeletal: Positive for myalgias.  Neurological: Positive for headaches.       Objective:   Physical Exam  Constitutional: She appears well-developed.  HENT:  Head: Normocephalic.  Nose: Nose normal.  Mouth/Throat: Oropharynx is clear and moist. No oropharyngeal exudate.  Neck: Neck supple.  Cardiovascular: Normal rate and normal heart sounds.   No murmur heard. Pulmonary/Chest: Effort normal and breath sounds normal. She has no wheezes.  Lymphadenopathy:    She has no cervical adenopathy.  Skin: Skin is warm and dry.  Nursing note and vitals reviewed.         Assessment & Plan:  Viral syndrome Secondary rhinosinusitis Antibiotics prescribed warning signs discussed follow-up if progressive troubles. Use doxycycline twice a day with food. Call if any issues. Patient encouraged quit smoking.

## 2015-07-09 NOTE — Addendum Note (Signed)
Addended by: Levonne Hubert on: 07/09/2015 04:44 PM   Modules accepted: Orders

## 2015-08-01 ENCOUNTER — Ambulatory Visit (HOSPITAL_COMMUNITY): Payer: BC Managed Care – PPO

## 2015-08-07 ENCOUNTER — Ambulatory Visit (HOSPITAL_COMMUNITY)
Admission: RE | Admit: 2015-08-07 | Discharge: 2015-08-07 | Disposition: A | Discharge status: Home / Self Care | Payer: BC Managed Care – PPO | Source: Ambulatory Visit | Attending: Family Medicine | Admitting: Family Medicine

## 2015-08-07 DIAGNOSIS — Z1231 Encounter for screening mammogram for malignant neoplasm of breast: Secondary | ICD-10-CM | POA: Insufficient documentation

## 2015-08-18 ENCOUNTER — Telehealth: Payer: Self-pay | Admitting: Internal Medicine

## 2015-08-18 NOTE — Telephone Encounter (Signed)
I see event monitor scanned under media,but no results to give pt

## 2015-08-18 NOTE — Telephone Encounter (Signed)
New message   Patient calling for monitor results 

## 2015-08-19 ENCOUNTER — Telehealth: Payer: Self-pay | Admitting: Internal Medicine

## 2015-08-19 NOTE — Telephone Encounter (Signed)
Pt notified that Dr Harrington Challenger is out of the office and will return in 2 days and we should have event monitor results

## 2015-08-19 NOTE — Telephone Encounter (Signed)
Pt would like to know if the results from her event monitor are back yet

## 2015-08-20 ENCOUNTER — Encounter (HOSPITAL_COMMUNITY): Admission: RE | Admit: 2015-08-20 | Payer: BC Managed Care – PPO | Source: Ambulatory Visit

## 2015-08-20 ENCOUNTER — Encounter (HOSPITAL_COMMUNITY): Payer: BC Managed Care – PPO

## 2015-08-21 ENCOUNTER — Encounter (HOSPITAL_COMMUNITY): Admission: RE | Admit: 2015-08-21 | Payer: BC Managed Care – PPO | Source: Ambulatory Visit

## 2015-08-21 ENCOUNTER — Inpatient Hospital Stay
Admission: RE | Admit: 2015-08-21 | Payer: BC Managed Care – PPO | Source: Ambulatory Visit | Admitting: Pulmonary Disease

## 2015-08-21 ENCOUNTER — Encounter (HOSPITAL_COMMUNITY)
Admission: RE | Admit: 2015-08-21 | Discharge: 2015-08-21 | Disposition: A | Discharge status: Home / Self Care | Payer: BC Managed Care – PPO | Source: Ambulatory Visit | Attending: Family Medicine | Admitting: Family Medicine

## 2015-08-21 ENCOUNTER — Encounter (HOSPITAL_COMMUNITY): Payer: Self-pay

## 2015-08-21 DIAGNOSIS — M810 Age-related osteoporosis without current pathological fracture: Secondary | ICD-10-CM | POA: Diagnosis present

## 2015-08-21 LAB — RENAL FUNCTION PANEL
ALBUMIN: 4.7 g/dL (ref 3.5–5.0)
ANION GAP: 7 (ref 5–15)
BUN: 18 mg/dL (ref 6–20)
CALCIUM: 9.7 mg/dL (ref 8.9–10.3)
CO2: 27 mmol/L (ref 22–32)
CREATININE: 0.48 mg/dL (ref 0.44–1.00)
Chloride: 107 mmol/L (ref 101–111)
GFR calc Af Amer: >60 mL/min (ref >60)
GFR calc non Af Amer: >60 mL/min (ref >60)
GLUCOSE: 104 mg/dL — AB (ref 65–99)
PHOSPHORUS: 3.2 mg/dL (ref 2.5–4.6)
Potassium: 3.2 mmol/L — ABNORMAL LOW (ref 3.5–5.1)
SODIUM: 141 mmol/L (ref 135–145)

## 2015-08-21 MED ORDER — IBANDRONATE SODIUM 3 MG/3ML IV SOLN
3.0000 mg | Freq: Once | INTRAVENOUS | Status: AC
  Administered 2015-08-21: 3 mg via INTRAVENOUS
  Filled 2015-08-21: qty 3

## 2015-08-21 NOTE — Progress Notes (Addendum)
Results for Isabel Atkinson, Isabel Atkinson (MRN [id number]) as of 08/21/2015 15:20  Labs drawn prior to [facility] visit 11/19/2015 @230pm  Office notified at 1525 of low potassium level.   Ref. Range 08/21/2015 14:50  Sodium Latest Ref Range: 135-145 mmol/L 141  Potassium Latest Ref Range: 3.5-5.1 mmol/L 3.2 (L)  Chloride Latest Ref Range: 101-111 mmol/L 107  CO2 Latest Ref Range: 22-32 mmol/L 27  BUN Latest Ref Range: 6-20 mg/dL 18  Creatinine Latest Ref Range: 0.44-1.00 mg/dL 0.48  Calcium Latest Ref Range: 8.9-10.3 mg/dL 9.7  EGFR (Non-African Amer.) Latest Ref Range: >60 mL/min >60  EGFR (African American) Latest Ref Range: >60 mL/min >60  Glucose Latest Ref Range: 65-99 mg/dL 104 (H)  Anion gap Latest Ref Range: 5-15  7  Phosphorus Latest Ref Range: 2.5-4.6 mg/dL 3.2  Albumin Latest Ref Range: 3.5-5.0 g/dL 4.7

## 2015-08-22 ENCOUNTER — Telehealth: Payer: Self-pay | Admitting: Family Medicine

## 2015-08-22 DIAGNOSIS — Z139 Encounter for screening, unspecified: Secondary | ICD-10-CM

## 2015-08-22 MED ORDER — POTASSIUM CHLORIDE ER 10 MEQ PO TBCR: 10.0000 meq | EXTENDED_RELEASE_TABLET | Freq: Every day | ORAL | Status: DC

## 2015-08-22 NOTE — Telephone Encounter (Signed)
Dr Bronson Ing read as NSR except when pt was walking HR 124,LM on pt's private vm of his reading,copy to pcp

## 2015-08-22 NOTE — Telephone Encounter (Signed)
Called patient and informed her per Dr.Scott Luking- Potassium is low. Needs to start on potassium 41mEq daily. Also patient needs to repeat BMET and Magnesium in two weeks patient verbalized understanding.

## 2015-08-23 NOTE — Telephone Encounter (Signed)
No arrhythmias noted  Could try low dose b blocker (Toprol XL 25)  Start with 1/2 then increase to 1  Follow symptoms

## 2015-08-26 ENCOUNTER — Encounter: Payer: Self-pay | Admitting: Family Medicine

## 2015-08-26 ENCOUNTER — Ambulatory Visit (INDEPENDENT_AMBULATORY_CARE_PROVIDER_SITE_OTHER): Payer: BC Managed Care – PPO | Admitting: Family Medicine

## 2015-08-26 VITALS — BP 124/78 | Temp 99.7°F | Ht 62.0 in | Wt 98.1 lb

## 2015-08-26 DIAGNOSIS — J019 Acute sinusitis, unspecified: Secondary | ICD-10-CM | POA: Diagnosis not present

## 2015-08-26 DIAGNOSIS — B9689 Other specified bacterial agents as the cause of diseases classified elsewhere: Secondary | ICD-10-CM

## 2015-08-26 DIAGNOSIS — E876 Hypokalemia: Secondary | ICD-10-CM | POA: Diagnosis not present

## 2015-08-26 MED ORDER — LEVOFLOXACIN 500 MG PO TABS: ORAL_TABLET | ORAL | Status: DC

## 2015-08-26 NOTE — Progress Notes (Signed)
   Subjective:    Patient ID: Isabel Atkinson, female    DOB: Apr 18, 1966, 50 y.o.   MRN: [id number]  Sinusitis This is a new problem. The current episode started in the past 7 days. Associated symptoms include congestion, coughing, ear pain and sinus pressure. Pertinent negatives include no shortness of breath. (Runny Nose) Treatments tried: Claritin. The treatment provided no relief.   patient with her recent lab work with her osteoporosis showed low potassium so therefore today she is having questions regarding this denies any cramping currently  Patient would like to discuss potassium levels.   Review of Systems  Constitutional: Negative for fever and activity change.  HENT: Positive for congestion, ear pain, rhinorrhea and sinus pressure.   Eyes: Negative for discharge.  Respiratory: Positive for cough. Negative for shortness of breath and wheezing.   Cardiovascular: Negative for chest pain.       Objective:   Physical Exam  Constitutional: She appears well-developed.  HENT:  Head: Normocephalic.  Nose: Nose normal.  Mouth/Throat: Oropharynx is clear and moist. No oropharyngeal exudate.  Neck: Neck supple.  Cardiovascular: Normal rate and normal heart sounds.   No murmur heard. Pulmonary/Chest: Effort normal and breath sounds normal. She has no wheezes.  Lymphadenopathy:    She has no cervical adenopathy.  Skin: Skin is warm and dry.  Nursing note and vitals reviewed.         Assessment & Plan:  Patient's potassium was low she is now taken a supplement if this continues to be low she will need some further studies to look for other potential causes  Sinusitis antibiotics prescribed warning signs discussed

## 2015-09-06 LAB — BASIC METABOLIC PANEL
BUN/Creatinine Ratio: 25 — ABNORMAL HIGH (ref 9–23)
BUN: 13 mg/dL (ref 6–24)
CHLORIDE: 101 mmol/L (ref 96–106)
CO2: 25 mmol/L (ref 18–29)
CREATININE: 0.51 mg/dL — AB (ref 0.57–1.00)
Calcium: 9 mg/dL (ref 8.7–10.2)
GFR calc Af Amer: 129 mL/min/{1.73_m2} (ref >59)
GFR, EST NON AFRICAN AMERICAN: 112 mL/min/{1.73_m2} (ref >59)
GLUCOSE: 101 mg/dL — AB (ref 65–99)
POTASSIUM: 4.1 mmol/L (ref 3.5–5.2)
Sodium: 139 mmol/L (ref 134–144)

## 2015-09-06 LAB — MAGNESIUM: MAGNESIUM: 2.4 mg/dL — AB (ref 1.6–2.3)

## 2015-09-08 ENCOUNTER — Encounter: Payer: Self-pay | Admitting: Nurse Practitioner

## 2015-09-08 ENCOUNTER — Ambulatory Visit (INDEPENDENT_AMBULATORY_CARE_PROVIDER_SITE_OTHER): Payer: BC Managed Care – PPO | Admitting: Nurse Practitioner

## 2015-09-08 VITALS — BP 128/82 | Temp 99.0°F | Ht 62.0 in | Wt 100.2 lb

## 2015-09-08 DIAGNOSIS — J310 Chronic rhinitis: Secondary | ICD-10-CM

## 2015-09-08 DIAGNOSIS — J329 Chronic sinusitis, unspecified: Secondary | ICD-10-CM

## 2015-09-08 MED ORDER — LEVOFLOXACIN 500 MG PO TABS: ORAL_TABLET | ORAL | Status: DC

## 2015-09-10 ENCOUNTER — Other Ambulatory Visit: Payer: Self-pay | Admitting: Nurse Practitioner

## 2015-09-10 ENCOUNTER — Encounter: Payer: Self-pay | Admitting: Nurse Practitioner

## 2015-09-10 ENCOUNTER — Telehealth: Payer: Self-pay | Admitting: Nurse Practitioner

## 2015-09-10 DIAGNOSIS — R5383 Other fatigue: Secondary | ICD-10-CM

## 2015-09-10 NOTE — Telephone Encounter (Signed)
Carolyn  Pt seen 09/08/15  Pt states you asked her to call back today if she was not a whole lot better  States she is still very weak, headache still there, other symptoms i.e. Congestion  Is better at this point. She thought you wanted to have her do BW?   Please advise   [place]

## 2015-09-10 NOTE — Telephone Encounter (Signed)
I have ordered labwork to assess her fatigue. This will include tests that were not ordered at recent visit.

## 2015-09-10 NOTE — Progress Notes (Signed)
Subjective:  Presents for complaints of cough and congestion over the past 8 days. Low grade fever. Has "no energy". Generalized headache. Rare cough. Finished antibiotic for sinus infection that was treated on 1/17. Left ear pain and pain on the left side of the neck. No wheezing. No sore throat. No vomiting diarrhea or abdominal pain. Taking fluids well. History of hypokalemia around the time she takes [place]. This resolves after she takes oral potassium for about a week. Admits to being under tremendous stress.  Objective:   BP 128/82 mmHg  Temp(Src) 99 F (37.2 C) (Oral)  Ht 5\' 2"  (1.575 m)  Wt 100 lb 3.2 oz (45.45 kg)  BMI 18.32 kg/m2  NAD. Alert, oriented. TMs clear effusion, no erythema. Pharynx clear. Neck supple with mild soft anterior adenopathy. Lungs clear. Heart regular rate rhythm. Pain across the left ethmoid/frontal area. Minimal tenderness along the left lateral neck area with no significant adenopathy.   Assessment: Rhinosinusitis  Plan:  Meds ordered this encounter  Medications  . levofloxacin (LEVAQUIN) 500 MG tablet    Sig: Take 1 tablet daily by mouth for 10 days.    Dispense:  10 tablet    Refill:  0    Order Specific Question:  Supervising Provider    Answer:  Mikey Kirschner [[id number]]    OTC meds as directed for congestion. Call back in 48-72 hours if no improvement, sooner if worse.

## 2015-09-10 NOTE — Telephone Encounter (Signed)
Patient is very weak, has a headache and left sided neck pain. She has no other symptoms.

## 2015-09-10 NOTE — Telephone Encounter (Signed)
Called patient and informed her per Pearson Forster, NP- Ordered lab work to assess her fatigue. This will include tests that were not ordered at recent visit. Patient verbalized understanding.

## 2015-09-10 NOTE — Telephone Encounter (Signed)
NTC: please find out what symptoms she is having today. Thanks.

## 2015-09-11 ENCOUNTER — Telehealth: Payer: Self-pay | Admitting: *Deleted

## 2015-09-11 LAB — CBC WITH DIFFERENTIAL/PLATELET
Basophils Absolute: 0 10*3/uL (ref 0.0–0.2)
Basos: 0 %
EOS (ABSOLUTE): 0.1 10*3/uL (ref 0.0–0.4)
EOS: 1 %
HEMATOCRIT: 39.5 % (ref 34.0–46.6)
Hemoglobin: 13.2 g/dL (ref 11.1–15.9)
IMMATURE GRANS (ABS): 0 10*3/uL (ref 0.0–0.1)
IMMATURE GRANULOCYTES: 0 %
LYMPHS: 26 %
Lymphocytes Absolute: 2 10*3/uL (ref 0.7–3.1)
MCH: 29.5 pg (ref 26.6–33.0)
MCHC: 33.4 g/dL (ref 31.5–35.7)
MCV: 88 fL (ref 79–97)
MONOCYTES: 6 %
MONOS ABS: 0.4 10*3/uL (ref 0.1–0.9)
NEUTROS PCT: 67 %
Neutrophils Absolute: 4.9 10*3/uL (ref 1.4–7.0)
Platelets: 349 10*3/uL (ref 150–379)
RBC: 4.47 x10E6/uL (ref 3.77–5.28)
RDW: 13.3 % (ref 12.3–15.4)
WBC: 7.4 10*3/uL (ref 3.4–10.8)

## 2015-09-11 LAB — HEPATIC FUNCTION PANEL
ALBUMIN: 4.4 g/dL (ref 3.5–5.5)
ALT: 12 IU/L (ref 0–32)
AST: 16 IU/L (ref 0–40)
Alkaline Phosphatase: 72 IU/L (ref 39–117)
Bilirubin, Direct: 0.06 mg/dL (ref 0.00–0.40)
Total Protein: 6.3 g/dL (ref 6.0–8.5)

## 2015-09-11 LAB — TSH: TSH: 1.03 u[IU]/mL (ref 0.450–4.500)

## 2015-09-11 NOTE — Telephone Encounter (Signed)
Needs work excuse for this Wednesday, Thursday, Friday. Pt aware carolyn will be back tomorrow.

## 2015-09-12 ENCOUNTER — Encounter: Payer: Self-pay | Admitting: Family Medicine

## 2015-09-12 NOTE — Telephone Encounter (Signed)
Note done pt notified

## 2015-09-12 NOTE — Telephone Encounter (Signed)
Please give work note. Thanks.

## 2015-09-26 ENCOUNTER — Ambulatory Visit (INDEPENDENT_AMBULATORY_CARE_PROVIDER_SITE_OTHER): Payer: BC Managed Care – PPO | Admitting: Family Medicine

## 2015-09-26 ENCOUNTER — Encounter: Payer: Self-pay | Admitting: Family Medicine

## 2015-09-26 VITALS — BP 112/68 | Temp 98.9°F | Ht 62.0 in | Wt 101.1 lb

## 2015-09-26 DIAGNOSIS — R1310 Dysphagia, unspecified: Secondary | ICD-10-CM

## 2015-09-26 MED ORDER — RANITIDINE HCL 150 MG PO TABS: ORAL_TABLET | ORAL | Status: DC

## 2015-09-26 NOTE — Patient Instructions (Addendum)
Do the zantac 2 times a day then in 3 weeks if still with neck issues let me know ( may need to see ENT)

## 2015-09-26 NOTE — Progress Notes (Signed)
   Subjective:    Patient ID: Isabel Atkinson, female    DOB: [birth year], 50 y.o.   MRN: [id number]  HPI Patient in today for swollen gland to left side of neck.   States no other concerns this visit. No dysphagia. Does not wake her up at night. His morbid soreness in the throat but no severe pain does radiate into the left side of her neck does not radiate down the arm does not radiate into the shoulder or into the hand  Review of Systems She denies high fever chills sweats nausea vomiting diarrhea she did have a bad cold but she feels that is starting to get better she was on antibiotics    Objective:   Physical Exam Her lungs are clear hearts regular neck no masses I felt around her neck thoroughly a did not feel any lumps there is no tenderness she has subjected discomfort along the left side of the neck the throat appears normal       Assessment & Plan:  Patient is having significant soreness in her throat but I don't see any obvious evidence of infection she does have frequent reflux and is not taking anything for it mainly because she was scared to take medication on a regular basis she was using ranitidine occasionally I told her to start using that twice daily over the next few weeks if she is not doing significantly better next step would be referral to ENT because of history of reflux plus also history of smoking  I told the patient if she is not 100% better within 3 week she must call us and we will help set her up with ENT

## 2015-09-29 ENCOUNTER — Other Ambulatory Visit: Payer: Self-pay | Admitting: *Deleted

## 2015-09-29 ENCOUNTER — Telehealth: Payer: Self-pay | Admitting: Family Medicine

## 2015-09-29 MED ORDER — AMOXICILLIN-POT CLAVULANATE 875-125 MG PO TABS: 1.0000 | ORAL_TABLET | Freq: Two times a day (BID) | ORAL | Status: DC

## 2015-09-29 NOTE — Telephone Encounter (Signed)
I would recommend Augmentin 875 mg 1 twice a day for 10 days

## 2015-09-29 NOTE — Telephone Encounter (Signed)
Pt has been seen several times in the past month or so for sinus issues  Is currently having cough, runny nose, headache it call come on after  Being in here on Friday for a recheck. Please call in a antibiotic to  [facility], pt would rather not come back in to expose herself more  If she don't have to.

## 2015-09-29 NOTE — Telephone Encounter (Signed)
Med sent to pharm. Pt notified.  

## 2015-09-29 NOTE — Telephone Encounter (Signed)
Spoke with patient to verify symptoms. Patient has c/o of productive cough with green mucus, runny nose, sinus pressure, left sided neck pain, and low grade fever of 99. Please advise?

## 2015-10-07 ENCOUNTER — Encounter: Payer: Self-pay | Admitting: Family Medicine

## 2015-10-07 ENCOUNTER — Ambulatory Visit (INDEPENDENT_AMBULATORY_CARE_PROVIDER_SITE_OTHER): Payer: BC Managed Care – PPO | Admitting: Family Medicine

## 2015-10-07 VITALS — BP 112/74 | Temp 99.7°F | Ht 62.0 in | Wt 99.4 lb

## 2015-10-07 DIAGNOSIS — R109 Unspecified abdominal pain: Secondary | ICD-10-CM | POA: Diagnosis not present

## 2015-10-07 DIAGNOSIS — N200 Calculus of kidney: Secondary | ICD-10-CM | POA: Diagnosis not present

## 2015-10-07 LAB — POCT URINALYSIS DIPSTICK
Blood, UA: 250
Protein, UA: 30
Spec Grav, UA: >=1.03
pH, UA: 5

## 2015-10-07 MED ORDER — OXYCODONE-ACETAMINOPHEN 5-325 MG PO TABS: 1.0000 | ORAL_TABLET | ORAL | Status: DC | PRN

## 2015-10-07 MED ORDER — LEVOFLOXACIN 500 MG PO TABS: 500.0000 mg | ORAL_TABLET | Freq: Every day | ORAL | Status: DC

## 2015-10-07 NOTE — Progress Notes (Signed)
   Subjective:    Patient ID: Isabel Atkinson, female    DOB: 1965-11-21, 50 y.o.   MRN: [id number]  Abdominal Pain This is a new problem. The current episode started in the past 7 days. The pain is located in the LLQ. Associated symptoms include hematuria. Associated symptoms comments: Frequent urination. Treatments tried: taking augmentin for sinus infection.    patient overall doing fairly well but having left flank the left lower quadrant pain discomfort with hematuria been present over the course of the past 24-48 hours. Has never had kidney stones but brother has had some  urinalysis with rbc's.  Review of Systems  Gastrointestinal: Positive for abdominal pain.  Genitourinary: Positive for hematuria.       Objective:   Physical Exam  flanks moderate tenderness on the left side and left lower quadrant lungs clear heart regular abdomen soft otherwise   I doubt that this is bladder or kidney cancer     Assessment & Plan:   kidney stone- culture of urine antibiotics prescribed pain medication prescribed if not improving over the next 48 hours will need CT scan, repeat urinalysis in 2 weeks time to make sure all blood has resolved if high fever chills or significant pain or discomfort go to ER follow-up of problems

## 2015-10-09 ENCOUNTER — Other Ambulatory Visit: Payer: Self-pay

## 2015-10-09 ENCOUNTER — Ambulatory Visit (HOSPITAL_COMMUNITY)
Admission: RE | Admit: 2015-10-09 | Discharge: 2015-10-09 | Disposition: A | Discharge status: Home / Self Care | Payer: BC Managed Care – PPO | Source: Ambulatory Visit | Attending: Family Medicine | Admitting: Family Medicine

## 2015-10-09 ENCOUNTER — Telehealth: Payer: Self-pay | Admitting: Family Medicine

## 2015-10-09 ENCOUNTER — Other Ambulatory Visit: Payer: Self-pay | Admitting: Family Medicine

## 2015-10-09 DIAGNOSIS — R319 Hematuria, unspecified: Secondary | ICD-10-CM | POA: Insufficient documentation

## 2015-10-09 DIAGNOSIS — R109 Unspecified abdominal pain: Secondary | ICD-10-CM | POA: Diagnosis not present

## 2015-10-09 DIAGNOSIS — N200 Calculus of kidney: Secondary | ICD-10-CM | POA: Diagnosis not present

## 2015-10-09 LAB — URINE CULTURE

## 2015-10-09 NOTE — Telephone Encounter (Signed)
Pt called stating that there is still blood in the urine and pain on her left side. The pain isn't horrible but is noticeable. Please advise.

## 2015-10-09 NOTE — Telephone Encounter (Signed)
See result note.  

## 2015-10-14 ENCOUNTER — Encounter: Payer: Self-pay | Admitting: Family Medicine

## 2015-10-14 ENCOUNTER — Telehealth: Payer: Self-pay | Admitting: Family Medicine

## 2015-10-14 DIAGNOSIS — N200 Calculus of kidney: Secondary | ICD-10-CM

## 2015-10-14 NOTE — Telephone Encounter (Signed)
Given that the patient is still having a dull ache I recommend that we work toward setting her up with [facility]. Ideally hopefully they can see her either in [place] or [place] office within the next several days if possible because of persisting kidney stones pain. She is already had CT scan. Please put in referral as urgent thank you-please inform the patient that we feel that she needs to be seen by urology and we are doing the best we can at getting her a quick appointment.

## 2015-10-14 NOTE — Telephone Encounter (Signed)
Pt aware dr Nicki Reaper is out of office this afternoon. Pt states she was suppose to give you an update. Dull ache but has not had to take any of the pain med.

## 2015-10-14 NOTE — Telephone Encounter (Signed)
Pt called stating that she still hasn't passed the stone. Pt states that it is still a dull ache.

## 2015-10-14 NOTE — Telephone Encounter (Signed)
Spoke with patient and informed her per Dr.Scott Luking-Given that the patient is still having a dull ache Dr.Scott recommends that we work toward setting her up with [facility]. Ideally hopefully they can see her either in [place] or [place] office within the next several days if possible because of persisting kidney stones pain. She is already had CT scan. Patient verbalized understanding. Referral for Urology put into epic.

## 2015-10-21 ENCOUNTER — Encounter: Payer: Self-pay | Admitting: Family Medicine

## 2015-10-21 ENCOUNTER — Ambulatory Visit (INDEPENDENT_AMBULATORY_CARE_PROVIDER_SITE_OTHER): Payer: BC Managed Care – PPO | Admitting: Family Medicine

## 2015-10-21 VITALS — BP 126/76 | Ht 62.0 in | Wt 101.4 lb

## 2015-10-21 DIAGNOSIS — N200 Calculus of kidney: Secondary | ICD-10-CM

## 2015-10-21 DIAGNOSIS — H9202 Otalgia, left ear: Secondary | ICD-10-CM

## 2015-10-21 DIAGNOSIS — R109 Unspecified abdominal pain: Secondary | ICD-10-CM | POA: Diagnosis not present

## 2015-10-21 LAB — POCT URINALYSIS DIPSTICK
Leukocytes, UA: NEGATIVE
PH UA: 5
SPEC GRAV UA: 1.015

## 2015-10-21 MED ORDER — BUPROPION HCL ER (SR) 150 MG PO TB12: 150.0000 mg | ORAL_TABLET | Freq: Two times a day (BID) | ORAL | Status: DC

## 2015-10-21 MED ORDER — FLUCONAZOLE 150 MG PO TABS: ORAL_TABLET | ORAL | Status: DC

## 2015-10-21 MED ORDER — ALPRAZOLAM 0.25 MG PO TABS: ORAL_TABLET | ORAL | Status: DC

## 2015-10-21 NOTE — Progress Notes (Signed)
   Subjective:    Patient ID: Isabel Atkinson, female    DOB: 04-23-1966, 50 y.o.   MRN: [id number]  Abdominal Pain This is a recurrent problem. The current episode started 1 to 4 weeks ago. The problem has been rapidly improving. The pain is located in the RLQ. The quality of the pain is dull.   Patient in today for a 2 week recheck on abdominal pain and kidney stones.  Patient patient had recent CT scan which showed stones. Patient having intermittent pain on that side is going to be seen urology tomorrow Review of Systems  Gastrointestinal: Positive for abdominal pain.   has not seen any gross hematuria denies fever chills sweats Patient also with intermittent left ear pain and pain with swallowing. Once again patient is smoker is been counseled to quit    Objective:   Physical Exam  Lungs clear hearts regular left flank nontender urinalysis with some RBCs 1-3      Assessment & Plan:  Hematuria has significantly improved compared where it was. Patient still has some dull discomfort in her left flank possibility of stones still been present  Patient has been referred to ENT for left ear pain and odynophagia   Patient being a smoker is at higher risk of bladder cancer. Seen urology tomorrow. They will evaluate if they feel further testing including the possibility of cystoscopy is necessary

## 2015-10-22 ENCOUNTER — Encounter: Payer: Self-pay | Admitting: Family Medicine

## 2015-10-31 ENCOUNTER — Other Ambulatory Visit: Payer: Self-pay | Admitting: Urology

## 2015-10-31 ENCOUNTER — Ambulatory Visit (INDEPENDENT_AMBULATORY_CARE_PROVIDER_SITE_OTHER): Payer: BC Managed Care – PPO | Admitting: Urology

## 2015-10-31 ENCOUNTER — Ambulatory Visit (HOSPITAL_COMMUNITY)
Admission: RE | Admit: 2015-10-31 | Discharge: 2015-10-31 | Disposition: A | Discharge status: Home / Self Care | Payer: BC Managed Care – PPO | Source: Ambulatory Visit | Attending: Urology | Admitting: Urology

## 2015-10-31 DIAGNOSIS — N200 Calculus of kidney: Secondary | ICD-10-CM

## 2015-10-31 DIAGNOSIS — R310 Gross hematuria: Secondary | ICD-10-CM | POA: Diagnosis not present

## 2015-11-10 ENCOUNTER — Ambulatory Visit (INDEPENDENT_AMBULATORY_CARE_PROVIDER_SITE_OTHER): Payer: BC Managed Care – PPO | Admitting: Nurse Practitioner

## 2015-11-10 ENCOUNTER — Encounter: Payer: Self-pay | Admitting: Nurse Practitioner

## 2015-11-10 ENCOUNTER — Encounter: Payer: Self-pay | Admitting: Family Medicine

## 2015-11-10 VITALS — BP 134/88 | Temp 100.2°F | Ht 62.0 in | Wt 110.2 lb

## 2015-11-10 DIAGNOSIS — B349 Viral infection, unspecified: Secondary | ICD-10-CM

## 2015-11-10 DIAGNOSIS — R300 Dysuria: Secondary | ICD-10-CM

## 2015-11-10 LAB — POCT URINALYSIS DIPSTICK
Blood, UA: 250
Spec Grav, UA: >=1.03
pH, UA: 5

## 2015-11-11 ENCOUNTER — Emergency Department (HOSPITAL_COMMUNITY): Payer: BC Managed Care – PPO

## 2015-11-11 ENCOUNTER — Encounter (HOSPITAL_COMMUNITY): Payer: Self-pay | Admitting: Emergency Medicine

## 2015-11-11 ENCOUNTER — Emergency Department (HOSPITAL_COMMUNITY)
Admission: EM | Admit: 2015-11-11 | Discharge: 2015-11-11 | Disposition: A | Discharge status: Home / Self Care | Payer: BC Managed Care – PPO | Attending: Emergency Medicine | Admitting: Emergency Medicine

## 2015-11-11 ENCOUNTER — Encounter: Payer: Self-pay | Admitting: Nurse Practitioner

## 2015-11-11 DIAGNOSIS — N23 Unspecified renal colic: Secondary | ICD-10-CM | POA: Insufficient documentation

## 2015-11-11 DIAGNOSIS — Z79899 Other long term (current) drug therapy: Secondary | ICD-10-CM | POA: Diagnosis not present

## 2015-11-11 DIAGNOSIS — F17210 Nicotine dependence, cigarettes, uncomplicated: Secondary | ICD-10-CM | POA: Diagnosis not present

## 2015-11-11 DIAGNOSIS — R1032 Left lower quadrant pain: Secondary | ICD-10-CM | POA: Diagnosis present

## 2015-11-11 LAB — BASIC METABOLIC PANEL
Anion gap: 10 (ref 5–15)
BUN: 12 mg/dL (ref 6–20)
CO2: 24 mmol/L (ref 22–32)
Calcium: 8.5 mg/dL — ABNORMAL LOW (ref 8.9–10.3)
Chloride: 104 mmol/L (ref 101–111)
Creatinine, Ser: 0.7 mg/dL (ref 0.44–1.00)
GFR calc Af Amer: >60 mL/min (ref >60)
GLUCOSE: 146 mg/dL — AB (ref 65–99)
POTASSIUM: 3.2 mmol/L — AB (ref 3.5–5.1)
Sodium: 138 mmol/L (ref 135–145)

## 2015-11-11 LAB — URINALYSIS, ROUTINE W REFLEX MICROSCOPIC
Bilirubin Urine: NEGATIVE
Glucose, UA: NEGATIVE mg/dL
Ketones, ur: NEGATIVE mg/dL
LEUKOCYTES UA: NEGATIVE
NITRITE: NEGATIVE
PROTEIN: NEGATIVE mg/dL
Specific Gravity, Urine: <1.005 — ABNORMAL LOW (ref 1.005–1.030)
pH: 6.5 (ref 5.0–8.0)

## 2015-11-11 LAB — CBC WITH DIFFERENTIAL/PLATELET
BASOS ABS: 0 10*3/uL (ref 0.0–0.1)
Basophils Relative: 0 %
EOS PCT: 0 %
Eosinophils Absolute: 0.1 10*3/uL (ref 0.0–0.7)
HCT: 37.5 % (ref 36.0–46.0)
Hemoglobin: 12.6 g/dL (ref 12.0–15.0)
LYMPHS ABS: 1 10*3/uL (ref 0.7–4.0)
LYMPHS PCT: 5 %
MCH: 30.1 pg (ref 26.0–34.0)
MCHC: 33.6 g/dL (ref 30.0–36.0)
MCV: 89.7 fL (ref 78.0–100.0)
MONO ABS: 1.4 10*3/uL — AB (ref 0.1–1.0)
Monocytes Relative: 7 %
Neutro Abs: 16.7 10*3/uL — ABNORMAL HIGH (ref 1.7–7.7)
Neutrophils Relative %: 88 %
PLATELETS: 263 10*3/uL (ref 150–400)
RBC: 4.18 MIL/uL (ref 3.87–5.11)
RDW: 13.7 % (ref 11.5–15.5)
WBC: 19.2 10*3/uL — ABNORMAL HIGH (ref 4.0–10.5)

## 2015-11-11 LAB — URINE MICROSCOPIC-ADD ON: WBC, UA: NONE SEEN WBC/hpf (ref 0–5)

## 2015-11-11 MED ORDER — IBUPROFEN 600 MG PO TABS: 600.0000 mg | ORAL_TABLET | Freq: Three times a day (TID) | ORAL | Status: DC | PRN

## 2015-11-11 MED ORDER — METOCLOPRAMIDE HCL 10 MG PO TABS: 10.0000 mg | ORAL_TABLET | Freq: Four times a day (QID) | ORAL | Status: DC | PRN

## 2015-11-11 MED ORDER — HYDROMORPHONE HCL 1 MG/ML IJ SOLN
1.0000 mg | Freq: Once | INTRAMUSCULAR | Status: AC
  Administered 2015-11-11: 1 mg via INTRAVENOUS
  Filled 2015-11-11: qty 1

## 2015-11-11 MED ORDER — POTASSIUM CHLORIDE CRYS ER 20 MEQ PO TBCR
40.0000 meq | EXTENDED_RELEASE_TABLET | Freq: Once | ORAL | Status: AC
  Administered 2015-11-11: 40 meq via ORAL
  Filled 2015-11-11: qty 2

## 2015-11-11 MED ORDER — KETOROLAC TROMETHAMINE 30 MG/ML IJ SOLN
30.0000 mg | Freq: Once | INTRAMUSCULAR | Status: AC
  Administered 2015-11-11: 30 mg via INTRAVENOUS
  Filled 2015-11-11: qty 1

## 2015-11-11 MED ORDER — HYDROMORPHONE HCL 1 MG/ML IJ SOLN
1.0000 mg | Freq: Once | INTRAMUSCULAR | Status: AC
  Administered 2015-11-11: 1 mg via INTRAVENOUS
  Filled 2015-11-11 (×2): qty 1

## 2015-11-11 MED ORDER — METOCLOPRAMIDE HCL 5 MG/ML IJ SOLN
10.0000 mg | Freq: Once | INTRAMUSCULAR | Status: AC
  Administered 2015-11-11: 10 mg via INTRAVENOUS
  Filled 2015-11-11: qty 2

## 2015-11-11 NOTE — Progress Notes (Signed)
Subjective:  Presents for complaints of cold symptoms that began 2 days ago. Low-grade fever. Left-sided sore throat. Headache. Spells of nonproductive cough. Left ear pain. No wheezing. Head congestion. No vomiting diarrhea. Mild discomfort with urination, no CVA or left flank pain. Having tenderness towards the left pelvic area, is seeing urology for a kidney stone. No obvious blood in her urine. Her husband has also been sick with a respiratory illness.  Objective:   BP 134/88 mmHg  Temp(Src) 100.2 F (37.9 C) (Oral)  Ht 5\' 2"  (1.575 m)  Wt 110 lb 3.2 oz (49.986 kg)  BMI 20.15 kg/m2 NAD. Alert, oriented. TMs clear effusion, no erythema. Pharynx minimally injected with cloudy PND noted. Neck supple with mild soft anterior adenopathy. Lungs clear. Heart regular rate rhythm. No left CVA or flank tenderness. Abdomen soft nondistended with left mid lower abdominal tenderness. No rebound or guarding. No obvious masses. Results for orders placed or performed in visit on 11/10/15  POCT urinalysis dipstick  Result Value Ref Range   Color, UA     Clarity, UA     Glucose, UA     Bilirubin, UA +    Ketones, UA     Spec Grav, UA >=1.030    Blood, UA 250    pH, UA 5.0    Protein, UA     Urobilinogen, UA     Nitrite, UA     Leukocytes, UA  Negative   Urine micro-negative.  Assessment: Viral illness  Dysuria - Plan: POCT urinalysis dipstick  Plan: Reviewed symptomatic care and warning signs for viral illness. With abdominal tenderness and blood in her urine, recommend follow-up with urology. Call or go to ED sooner if symptoms worsen. Call back in 72 hours if no improvement in respiratory symptoms.

## 2015-11-11 NOTE — ED Notes (Signed)
Pt reports left flank pain since Feb. Pt reports was told had a kidney stone in left kidney. Pt reports continuous pain and nausea. Pt reports emesis x1 today. nad noted.

## 2015-11-11 NOTE — Discharge Instructions (Signed)
Kidney Stones Call Dr. Ralene Muskrat office today or tomorrow to be arranged to be seen within a week. Tell office staff that your seen here today. Your blood sugar today was mildly elevated at 146. Your blood pressure was  elevated at 169/73. Blood pressure should be rechecked in 1-2 weeks that your primary care physician's office. Take your oxycodone or hydrocodone as needed for pain. Take the ibuprofen prescribed in addition if needed. Is also been prescribed medicine for nausea. Return if your pain or nausea are not well controlled with the medications prescribed Kidney stones (urolithiasis) are deposits that form inside your kidneys. The intense pain is caused by the stone moving through the urinary tract. When the stone moves, the ureter goes into spasm around the stone. The stone is usually passed in the urine.  CAUSES   A disorder that makes certain neck glands produce too much parathyroid hormone (primary hyperparathyroidism).  A buildup of uric acid crystals, similar to gout in your joints.  Narrowing (stricture) of the ureter.  A kidney obstruction present at birth (congenital obstruction).  Previous surgery on the kidney or ureters.  Numerous kidney infections. SYMPTOMS   Feeling sick to your stomach (nauseous).  Throwing up (vomiting).  Blood in the urine (hematuria).  Pain that usually spreads (radiates) to the groin.  Frequency or urgency of urination. DIAGNOSIS   Taking a history and physical exam.  Blood or urine tests.  CT scan.  Occasionally, an examination of the inside of the urinary bladder (cystoscopy) is performed. TREATMENT   Observation.  Increasing your fluid intake.  Extracorporeal shock wave lithotripsy--This is a noninvasive procedure that uses shock waves to break up kidney stones.  Surgery may be needed if you have severe pain or persistent obstruction. There are various surgical procedures. Most of the procedures are performed with the use of  small instruments. Only small incisions are needed to accommodate these instruments, so recovery time is minimized. The size, location, and chemical composition are all important variables that will determine the proper choice of action for you. Talk to your health care provider to better understand your situation so that you will minimize the risk of injury to yourself and your kidney.  HOME CARE INSTRUCTIONS   Drink enough water and fluids to keep your urine clear or pale yellow. This will help you to pass the stone or stone fragments.  Strain all urine through the provided strainer. Keep all particulate matter and stones for your health care provider to see. The stone causing the pain may be as small as a grain of salt. It is very important to use the strainer each and every time you pass your urine. The collection of your stone will allow your health care provider to analyze it and verify that a stone has actually passed. The stone analysis will often identify what you can do to reduce the incidence of recurrences.  Only take over-the-counter or prescription medicines for pain, discomfort, or fever as directed by your health care provider.  Keep all follow-up visits as told by your health care provider. This is important.  Get follow-up X-rays if required. The absence of pain does not always mean that the stone has passed. It may have only stopped moving. If the urine remains completely obstructed, it can cause loss of kidney function or even complete destruction of the kidney. It is your responsibility to make sure X-rays and follow-ups are completed. Ultrasounds of the kidney can show blockages and the status of the  kidney. Ultrasounds are not associated with any radiation and can be performed easily in a matter of minutes.  Make changes to your daily diet as told by your health care provider. You may be told to:  Limit the amount of salt that you eat.  Eat 5 or more servings of fruits and  vegetables each day.  Limit the amount of meat, poultry, fish, and eggs that you eat.  Collect a 24-hour urine sample as told by your health care provider.You may need to collect another urine sample every 6-12 months. SEEK MEDICAL CARE IF:  You experience pain that is progressive and unresponsive to any pain medicine you have been prescribed. SEEK IMMEDIATE MEDICAL CARE IF:   Pain cannot be controlled with the prescribed medicine.  You have a fever or shaking chills.  The severity or intensity of pain increases over 18 hours and is not relieved by pain medicine.  You develop a new onset of abdominal pain.  You feel faint or pass out.  You are unable to urinate.   This information is not intended to replace advice given to you by your health care provider. Make sure you discuss any questions you have with your health care provider.   Document Released: 07/26/2005 Document Revised: 04/16/2015 Document Reviewed: 12/27/2012 Elsevier Interactive Patient Education [employer].

## 2015-11-11 NOTE — ED Notes (Signed)
Pt with known kidney stone and has not passed yet, per pt was told that she should pass it, called Dr. Jeffie Pollock Friday and was told to call back Monday if still with pain.  Pt states pain was ok Monday but today pain and emesis x 2, taken vicodin at 0830 and another at 1030

## 2015-11-11 NOTE — ED Provider Notes (Signed)
CSN: [id number]     Arrival date & time 11/11/15  1001 History   First MD Initiated Contact with Patient 11/11/15 1122     Chief Complaint  Patient presents with  . Flank Pain     (Consider location/radiation/quality/duration/timing/severity/associated sxs/prior Treatment) HPI Complains of left flank pain rating to left groin onset or days ago. Pain not made better or worse by anything. Pain feels like ureteral colic she's had in the past. She states the pain was mild yesterday but much worse today. She's taken Vicodin, without relief. She is also vomited 3 times today. No other associated symptoms. Past Medical History  Diagnosis Date  . Anxiety   . Reactive airway disease   . Pre-diabetes    Past Surgical History  Procedure Laterality Date  . Tubal ligation  2000  . Ovarian cyst surgery  1997    removed  . Neck surgery  10/2012  . Esophagogastroduodenoscopy  2013    RMR: Possible cervical esophageal web-status post dilation as described above. Small hiatal hernia. Status post biopsy of normal appearing small bowel to screen for celiac disease.   . Colonoscopy  2013    RMR: Normal rectum. single diminutive sigmoid polyp noted above. Normal terminal ileum. Status post segmental biopsy.    Family History  Problem Relation Age of Onset  . Colon cancer Maternal Grandfather 56  . Colon polyps Mother 53  . Colon cancer Maternal Uncle 42  . Hypertension Father   . Diabetes Father   . Heart disease Father   . Hyperlipidemia Father    Social History  Substance Use Topics  . Smoking status: Current Every Day Smoker -- 1.00 packs/day for 25 years    Types: Cigarettes  . Smokeless tobacco: Never Used  . Alcohol Use: No   OB History    No data available     Review of Systems  Constitutional: Negative.   HENT: Negative.   Respiratory: Negative.   Cardiovascular: Negative.   Gastrointestinal: Positive for nausea and vomiting.  Genitourinary: Positive for flank pain.   Musculoskeletal: Negative.   Skin: Negative.   Neurological: Negative.   Psychiatric/Behavioral: Negative.   All other systems reviewed and are negative.     Allergies  Sulfa antibiotics and Fosamax  Home Medications   Prior to Admission medications   Medication Sig Start Date End Date Taking? Authorizing Provider  ALPRAZolam (XANAX) 0.25 MG tablet TAKE 1/2 TO 1 TABLET BY MOUTH TWICE DAILY AS NEEDED FOR ANXIETY. 10/21/15   Kathyrn Drown, MD  buPROPion (WELLBUTRIN SR) 150 MG 12 hr tablet Take 1 tablet (150 mg total) by mouth 2 (two) times daily. 10/21/15   Kathyrn Drown, MD  Calcium-Vitamin D-Vitamin K (CALCIUM SOFT CHEWS) 500-500-40 MG-UNT-MCG CHEW Chew 2 each by mouth daily.    Historical Provider, MD  cycloSPORINE (RESTASIS) 0.05 % ophthalmic emulsion Place 1 drop into both eyes 2 (two) times daily. For dry eyes    Historical Provider, MD  Dexlansoprazole (DEXILANT) 30 MG capsule Take 1 capsule (30 mg total) by mouth daily. 04/24/15   Nilda Simmer, NP  fluconazole (DIFLUCAN) 150 MG tablet 1 now and repeat in 1 week as needed 10/21/15   Kathyrn Drown, MD  Ibandronate Sodium (BONIVA IV) Inject into the vein every 3 (three) months.    Historical Provider, MD  Multiple Vitamins-Minerals (MULTIVITAMIN WITH MINERALS) tablet Take 1 tablet by mouth daily.    Historical Provider, MD  oxyCODONE-acetaminophen (PERCOCET/ROXICET) 5-325 MG tablet Take 1 tablet  by mouth every 4 (four) hours as needed. 10/07/15   Kathyrn Drown, MD  potassium chloride (K-DUR) 10 MEQ tablet Take 1 tablet (10 mEq total) by mouth daily. 08/22/15   Kathyrn Drown, MD  ranitidine (ZANTAC) 150 MG tablet Takes 1 BID 09/26/15   Kathyrn Drown, MD  valACYclovir (VALTREX) 1000 MG tablet Take 2 tabs po BID for 1 day for cold sores 08/14/14   Kathyrn Drown, MD   BP 141/59 mmHg  Pulse 67  Temp(Src) 98.6 F (37 C) (Oral)  Resp 18  Ht 5\' 2"  (1.575 m)  Wt 100 lb (45.36 kg)  BMI 18.29 kg/m2  SpO2 100% Physical Exam   Constitutional: She appears well-developed and well-nourished. She appears distressed.  Appears uncomfortable writhing on the bed  HENT:  Head: Normocephalic and atraumatic.  Eyes: Conjunctivae are normal. Pupils are equal, round, and reactive to light.  Neck: Neck supple. No tracheal deviation present. No thyromegaly present.  Cardiovascular: Normal rate and regular rhythm.   No murmur heard. Pulmonary/Chest: Effort normal and breath sounds normal.  Abdominal: Soft. Bowel sounds are normal. She exhibits no distension. There is no tenderness.  Mildly tender at left lower quadrant  Genitourinary:  No flank tenderness  Musculoskeletal: Normal range of motion. She exhibits no edema or tenderness.  Neurological: She is alert. Coordination normal.  Skin: Skin is warm and dry. No rash noted.  Psychiatric: She has a normal mood and affect.  Nursing note and vitals reviewed.   ED Course  Procedures (including critical care time) Labs Review Labs Reviewed  URINALYSIS, ROUTINE W REFLEX MICROSCOPIC (NOT AT [facility]) - Abnormal; Notable for the following:    Specific Gravity, Urine <1.005 (*)    Hgb urine dipstick MODERATE (*)    All other components within normal limits  URINE MICROSCOPIC-ADD ON - Abnormal; Notable for the following:    Squamous Epithelial / LPF 0-5 (*)    Bacteria, UA FEW (*)    All other components within normal limits    Imaging Review No results found. I have personally reviewed and evaluated these images and lab results as part of my medical decision-making.   EKG Interpretation None     1:30 PM pain not well controlled after treatment with intravenous opioids. Intravenous Toradol and additional IV hydromorphone ordered .nausea is well controlled presently. 2:50 PM pain much improved and she feels ready to go home. She is no longer nauseated. Results for orders placed or performed during the hospital encounter of 11/11/15  Urinalysis, Routine w reflex  microscopic-may I&O cath if menses (not at [facility])  Result Value Ref Range   Color, Urine YELLOW YELLOW   APPearance CLEAR CLEAR   Specific Gravity, Urine <1.005 (L) 1.005 - 1.030   pH 6.5 5.0 - 8.0   Glucose, UA NEGATIVE NEGATIVE mg/dL   Hgb urine dipstick MODERATE (A) NEGATIVE   Bilirubin Urine NEGATIVE NEGATIVE   Ketones, ur NEGATIVE NEGATIVE mg/dL   Protein, ur NEGATIVE NEGATIVE mg/dL   Nitrite NEGATIVE NEGATIVE   Leukocytes, UA NEGATIVE NEGATIVE  Urine microscopic-add on  Result Value Ref Range   Squamous Epithelial / LPF 0-5 (A) NONE SEEN   WBC, UA NONE SEEN 0 - 5 WBC/hpf   RBC / HPF 6-30 0 - 5 RBC/hpf   Bacteria, UA FEW (A) NONE SEEN  Basic metabolic panel  Result Value Ref Range   Sodium 138 135 - 145 mmol/L   Potassium 3.2 (L) 3.5 - 5.1 mmol/L  Chloride 104 101 - 111 mmol/L   CO2 24 22 - 32 mmol/L   Glucose, Bld 146 (H) 65 - 99 mg/dL   BUN 12 6 - 20 mg/dL   Creatinine, Ser 0.70 0.44 - 1.00 mg/dL   Calcium 8.5 (L) 8.9 - 10.3 mg/dL   GFR calc non Af Amer >60 >60 mL/min   GFR calc Af Amer >60 >60 mL/min   Anion gap 10 5 - 15  CBC with Differential/Platelet  Result Value Ref Range   WBC 19.2 (H) 4.0 - 10.5 K/uL   RBC 4.18 3.87 - 5.11 MIL/uL   Hemoglobin 12.6 12.0 - 15.0 g/dL   HCT 37.5 36.0 - 46.0 %   MCV 89.7 78.0 - 100.0 fL   MCH 30.1 26.0 - 34.0 pg   MCHC 33.6 30.0 - 36.0 g/dL   RDW 13.7 11.5 - 15.5 %   Platelets 263 150 - 400 K/uL   Neutrophils Relative % 88 %   Neutro Abs 16.7 (H) 1.7 - 7.7 K/uL   Lymphocytes Relative 5 %   Lymphs Abs 1.0 0.7 - 4.0 K/uL   Monocytes Relative 7 %   Monocytes Absolute 1.4 (H) 0.1 - 1.0 K/uL   Eosinophils Relative 0 %   Eosinophils Absolute 0.1 0.0 - 0.7 K/uL   Basophils Relative 0 %   Basophils Absolute 0.0 0.0 - 0.1 K/uL   Dg Abd 1 View  10/31/2015  CLINICAL DATA:  Left renal calculus EXAM: ABDOMEN - 1 VIEW COMPARISON:  10/09/2015 FINDINGS: There is normal small bowel gas pattern. Mild levoscoliosis. Study is limited as  the left kidney is obscured by abundant gas within stomach. No definite renal or ureteral calcifications. IMPRESSION: Study is limited as the left kidney is obscured by abundant gas within stomach. No definite renal or ureteral calcifications. Electronically Signed   By: Lahoma Crocker M.D.   On: 10/31/2015 16:34   US Renal  11/11/2015  CLINICAL DATA:  Left flank pain for 1 month EXAM: RENAL / URINARY TRACT ULTRASOUND COMPLETE COMPARISON:  10/09/2015 FINDINGS: Right Kidney: Length: 10.5 cm. Echogenicity within normal limits. No mass or hydronephrosis visualized. Left Kidney: Length: 11.1 cm. Moderate hydronephrosis. No mass. Normal cortical echogenicity. Bladder: Decompressed. IMPRESSION: Moderate left hydronephrosis has developed. Left ureteral obstruction cannot be excluded. Electronically Signed   By: Marybelle Killings M.D.   On: 11/11/2015 12:54    MDM  Patient has oxycodone and hydrocodone at home. I will write prescriptions for ibuprofen and Reglan  Suggest follow-up with Dr.Wrenn within a week. Blood pressure recheck 3 weeks Diagnosis #1 ureteral colic #2 hyperglycemia #3 elevated blood pressure #4 hypokalemia Final diagnoses:  None        Orlie Dakin, MD 11/11/15 1514

## 2015-11-18 ENCOUNTER — Ambulatory Visit (INDEPENDENT_AMBULATORY_CARE_PROVIDER_SITE_OTHER): Payer: BC Managed Care – PPO | Admitting: Urology

## 2015-11-18 DIAGNOSIS — N201 Calculus of ureter: Secondary | ICD-10-CM

## 2015-11-19 ENCOUNTER — Encounter (HOSPITAL_COMMUNITY)
Admission: RE | Admit: 2015-11-19 | Discharge: 2015-11-19 | Disposition: A | Discharge status: Home / Self Care | Payer: BC Managed Care – PPO | Source: Ambulatory Visit | Attending: Family Medicine | Admitting: Family Medicine

## 2015-11-19 ENCOUNTER — Encounter (HOSPITAL_COMMUNITY): Payer: Self-pay

## 2015-11-19 MED ORDER — IBANDRONATE SODIUM 3 MG/3ML IV SOLN
INTRAVENOUS | Status: AC
  Filled 2015-11-19: qty 3

## 2015-11-19 MED ORDER — IBANDRONATE SODIUM 3 MG/3ML IV SOLN: 3.0000 mg | Freq: Once | INTRAVENOUS | Status: DC

## 2015-11-24 ENCOUNTER — Ambulatory Visit (INDEPENDENT_AMBULATORY_CARE_PROVIDER_SITE_OTHER): Payer: BC Managed Care – PPO | Admitting: Family Medicine

## 2015-11-24 ENCOUNTER — Telehealth: Payer: Self-pay | Admitting: Family Medicine

## 2015-11-24 ENCOUNTER — Encounter: Payer: Self-pay | Admitting: Family Medicine

## 2015-11-24 VITALS — BP 104/70 | Temp 99.4°F | Ht 62.0 in | Wt 98.4 lb

## 2015-11-24 DIAGNOSIS — R300 Dysuria: Secondary | ICD-10-CM

## 2015-11-24 LAB — POCT URINALYSIS DIPSTICK
PH UA: 5
RBC UA: 50

## 2015-11-24 MED ORDER — CIPROFLOXACIN HCL 250 MG PO TABS: ORAL_TABLET | ORAL | Status: DC

## 2015-11-24 NOTE — Telephone Encounter (Signed)
ERROR

## 2015-11-24 NOTE — Progress Notes (Signed)
   Subjective:    Patient ID: Isabel Atkinson, female    DOB: July 09, 1966, 50 y.o.   MRN: [id number]  Dysuria  This is a new problem. The current episode started in the past 7 days. The problem occurs intermittently. The problem has been unchanged. The quality of the pain is described as burning. The pain is moderate. There has been no fever. She has tried nothing for the symptoms. The treatment provided no relief.   Results for orders placed or performed in visit on 11/24/15  POCT urinalysis dipstick  Result Value Ref Range   Color, UA     Clarity, UA     Glucose, UA     Bilirubin, UA     Ketones, UA     Spec Grav, UA >=1.030    Blood, UA 50    pH, UA 5.0    Protein, UA     Urobilinogen, UA     Nitrite, UA     Leukocytes, UA Trace (A) Negative    History of kidney stone. Dr. Roni Bread is working with her on this. She has some hydronephrosis left ureter. May need to get this mechanically retreated if she does not pass. Next  Increased burning. No fever no chills.   Review of Systems  Genitourinary: Positive for dysuria.   No vomiting no rash    Objective:   Physical Exam Alert vitals stable slight left CVA tenderness no right CVA tenderness lungs clear heart rare rhythm   Urinalysis 2-4 reds per high-power field 2-3 whites       Assessment & Plan:  Impression potential urinary tract infection compounding upon stone with hematuria plan Cipro twice a day 7 days. Culture urine further recommendations based results [facility]

## 2015-11-26 LAB — URINE CULTURE

## 2015-11-27 ENCOUNTER — Ambulatory Visit (HOSPITAL_COMMUNITY)
Admission: RE | Admit: 2015-11-27 | Discharge: 2015-11-27 | Disposition: A | Discharge status: Home / Self Care | Payer: BC Managed Care – PPO | Source: Ambulatory Visit | Attending: Urology | Admitting: Urology

## 2015-11-27 ENCOUNTER — Other Ambulatory Visit: Payer: Self-pay | Admitting: Urology

## 2015-11-27 ENCOUNTER — Encounter: Payer: Self-pay | Admitting: Family Medicine

## 2015-11-27 DIAGNOSIS — N201 Calculus of ureter: Secondary | ICD-10-CM

## 2015-11-28 ENCOUNTER — Ambulatory Visit (INDEPENDENT_AMBULATORY_CARE_PROVIDER_SITE_OTHER): Payer: BC Managed Care – PPO | Admitting: Urology

## 2015-11-28 DIAGNOSIS — R310 Gross hematuria: Secondary | ICD-10-CM

## 2015-11-28 DIAGNOSIS — N201 Calculus of ureter: Secondary | ICD-10-CM

## 2015-12-01 ENCOUNTER — Other Ambulatory Visit: Payer: Self-pay | Admitting: Urology

## 2015-12-03 ENCOUNTER — Ambulatory Visit (HOSPITAL_BASED_OUTPATIENT_CLINIC_OR_DEPARTMENT_OTHER): Admit: 2015-12-03 | Payer: BC Managed Care – PPO | Admitting: Urology

## 2015-12-03 ENCOUNTER — Encounter (HOSPITAL_BASED_OUTPATIENT_CLINIC_OR_DEPARTMENT_OTHER): Payer: Self-pay

## 2015-12-03 SURGERY — CYSTOSCOPY, WITH CALCULUS REMOVAL USING BASKET
Anesthesia: General | Laterality: Left

## 2015-12-11 ENCOUNTER — Ambulatory Visit (INDEPENDENT_AMBULATORY_CARE_PROVIDER_SITE_OTHER): Payer: BC Managed Care – PPO | Admitting: Otolaryngology

## 2015-12-11 DIAGNOSIS — H9202 Otalgia, left ear: Secondary | ICD-10-CM

## 2015-12-11 DIAGNOSIS — J342 Deviated nasal septum: Secondary | ICD-10-CM

## 2015-12-11 DIAGNOSIS — J310 Chronic rhinitis: Secondary | ICD-10-CM

## 2015-12-12 ENCOUNTER — Ambulatory Visit (INDEPENDENT_AMBULATORY_CARE_PROVIDER_SITE_OTHER): Payer: BC Managed Care – PPO | Admitting: Urology

## 2015-12-12 DIAGNOSIS — N201 Calculus of ureter: Secondary | ICD-10-CM

## 2016-01-02 ENCOUNTER — Telehealth: Payer: Self-pay | Admitting: Family Medicine

## 2016-01-02 MED ORDER — MUPIROCIN 2 % EX OINT: 1.0000 "application " | TOPICAL_OINTMENT | Freq: Two times a day (BID) | CUTANEOUS | Status: DC
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
## 2016-01-02 NOTE — Telephone Encounter (Signed)
Silvadene cream is no longer recommended. I would recommend Bactroban ointment apply thin amount once daily until healed. 30 g tube 3 refills

## 2016-01-02 NOTE — Telephone Encounter (Signed)
Rx sent electronically to pharmacy. Patient notified. 

## 2016-01-12 ENCOUNTER — Encounter: Payer: Self-pay | Admitting: Family Medicine

## 2016-01-12 ENCOUNTER — Ambulatory Visit (INDEPENDENT_AMBULATORY_CARE_PROVIDER_SITE_OTHER): Payer: BC Managed Care – PPO | Admitting: Family Medicine

## 2016-01-12 VITALS — BP 134/76 | Temp 98.8°F | Ht 62.0 in | Wt 101.1 lb

## 2016-01-12 DIAGNOSIS — R5383 Other fatigue: Secondary | ICD-10-CM | POA: Diagnosis not present

## 2016-01-12 DIAGNOSIS — E876 Hypokalemia: Secondary | ICD-10-CM

## 2016-01-12 DIAGNOSIS — R079 Chest pain, unspecified: Secondary | ICD-10-CM

## 2016-01-12 MED ORDER — CITALOPRAM HYDROBROMIDE 10 MG PO TABS: 10.0000 mg | ORAL_TABLET | Freq: Every day | ORAL | Status: DC

## 2016-01-12 NOTE — Progress Notes (Signed)
   Subjective:    Patient ID: Isabel Atkinson, female    DOB: Sep 07, 1965, 50 y.o.   MRN: [id number]  Chest Pain  This is a new problem. The current episode started yesterday. The problem occurs intermittently. The problem has been unchanged. The patient is experiencing no pain. Quality: funny. The pain is aggravated by nothing. She has tried nothing for the symptoms. The treatment provided no relief.   Woke up at 12:30 felt anxiety Chest pain Nausea/sweaty- funny feeling bp was up 144/100  And hr 106 elt bad this am Took a whole xanax and it helped  patient states she try Wellbutrin to quit smoking because appetite suppression   underlying stress and fatigue related to taking care of her grandchildren Fatigued lately  Has  A lot on her Doesn't sleep well  past medical history benign   recent lab work showed low potassium low calcium  patient not tolerating Boniva because it's difficult to get ID did not tolerate oral biphosphonate's. Review of Systems  Cardiovascular: Positive for chest pain.    patient describes as aching sharp pain chest last for a few seconds goes away shoots across the left side of her chest no shortness of breath associated with it patient does smoke she notes she needs to quit    Objective:   Physical Exam   lungs are clear hearts regular pulse normal extremities no edema skin warm dry neurologic gross normal      Assessment & Plan:   stress-related issues has to deal with a lot of stress at home taking care of young grandchildren. Patient would benefit from low-dose antidepressant May use nerve pills intermittently patient was warned that medication could cause nausea persists beyond a week stop medicine if having any difficulty with the medicine stop it follow-up in one month to see how things are going patient denies being suicidal    low potassium on recent lab work additional  Lab work indicated. Awake the results of this   Patient with significant  fatigue tiredness we'll check thyroid function also check CBC    chest pain probable musculoskeletal but will do a chest x-ray EKG looks good    patient with osteoporosis cannot tolerate Boniva IV because difficult time getting a vein we will look into Prolia

## 2016-01-14 ENCOUNTER — Ambulatory Visit (HOSPITAL_COMMUNITY)
Admission: RE | Admit: 2016-01-14 | Discharge: 2016-01-14 | Disposition: A | Discharge status: Home / Self Care | Payer: BC Managed Care – PPO | Source: Ambulatory Visit | Attending: Family Medicine | Admitting: Family Medicine

## 2016-01-14 DIAGNOSIS — R938 Abnormal findings on diagnostic imaging of other specified body structures: Secondary | ICD-10-CM | POA: Insufficient documentation

## 2016-01-14 DIAGNOSIS — R079 Chest pain, unspecified: Secondary | ICD-10-CM | POA: Insufficient documentation

## 2016-01-15 ENCOUNTER — Ambulatory Visit (INDEPENDENT_AMBULATORY_CARE_PROVIDER_SITE_OTHER): Payer: BC Managed Care – PPO | Admitting: Otolaryngology

## 2016-01-15 DIAGNOSIS — H9209 Otalgia, unspecified ear: Secondary | ICD-10-CM

## 2016-01-15 LAB — CBC WITH DIFFERENTIAL/PLATELET
BASOS: 0 %
Basophils Absolute: 0 10*3/uL (ref 0.0–0.2)
EOS (ABSOLUTE): 0.3 10*3/uL (ref 0.0–0.4)
EOS: 6 %
HEMATOCRIT: 39.3 % (ref 34.0–46.6)
HEMOGLOBIN: 13.1 g/dL (ref 11.1–15.9)
IMMATURE GRANS (ABS): 0 10*3/uL (ref 0.0–0.1)
IMMATURE GRANULOCYTES: 0 %
LYMPHS: 35 %
Lymphocytes Absolute: 2.2 10*3/uL (ref 0.7–3.1)
MCH: 29.7 pg (ref 26.6–33.0)
MCHC: 33.3 g/dL (ref 31.5–35.7)
MCV: 89 fL (ref 79–97)
MONOCYTES: 8 %
Monocytes Absolute: 0.5 10*3/uL (ref 0.1–0.9)
NEUTROS ABS: 3.1 10*3/uL (ref 1.4–7.0)
NEUTROS PCT: 51 %
Platelets: 278 10*3/uL (ref 150–379)
RBC: 4.41 x10E6/uL (ref 3.77–5.28)
RDW: 13.7 % (ref 12.3–15.4)
WBC: 6.1 10*3/uL (ref 3.4–10.8)

## 2016-01-15 LAB — BASIC METABOLIC PANEL
BUN / CREAT RATIO: 30 — AB (ref 9–23)
BUN: 16 mg/dL (ref 6–24)
CALCIUM: 9.3 mg/dL (ref 8.7–10.2)
CHLORIDE: 104 mmol/L (ref 96–106)
CO2: 25 mmol/L (ref 18–29)
CREATININE: 0.54 mg/dL — AB (ref 0.57–1.00)
GFR calc non Af Amer: 110 mL/min/{1.73_m2} (ref >59)
GFR, EST AFRICAN AMERICAN: 126 mL/min/{1.73_m2} (ref >59)
Glucose: 83 mg/dL (ref 65–99)
Potassium: 3.8 mmol/L (ref 3.5–5.2)
Sodium: 143 mmol/L (ref 134–144)

## 2016-01-15 LAB — TSH: TSH: 1.03 u[IU]/mL (ref 0.450–4.500)

## 2016-01-15 LAB — MAGNESIUM: Magnesium: 2.1 mg/dL (ref 1.6–2.3)

## 2016-01-15 LAB — T4, FREE: Free T4: 1.45 ng/dL (ref 0.82–1.77)

## 2016-01-26 ENCOUNTER — Encounter: Payer: Self-pay | Admitting: Family Medicine

## 2016-02-27 ENCOUNTER — Ambulatory Visit (INDEPENDENT_AMBULATORY_CARE_PROVIDER_SITE_OTHER): Payer: BC Managed Care – PPO | Admitting: Family Medicine

## 2016-02-27 VITALS — BP 112/74 | Wt 98.4 lb

## 2016-02-27 DIAGNOSIS — M810 Age-related osteoporosis without current pathological fracture: Secondary | ICD-10-CM

## 2016-02-27 DIAGNOSIS — F410 Panic disorder [episodic paroxysmal anxiety] without agoraphobia: Secondary | ICD-10-CM | POA: Diagnosis not present

## 2016-02-27 MED ORDER — ALPRAZOLAM 0.25 MG PO TABS: ORAL_TABLET | ORAL | Status: DC

## 2016-02-27 NOTE — Progress Notes (Signed)
   Subjective:    Patient ID: Isabel Atkinson, female    DOB: Aug 24, 1965, 50 y.o.   MRN: [id number]  HPIFollow up on depression. celexa started last month. Pt stopped taking after 2 weeks due to side effects.   patient states that her stress levels are doing better she is done a better job of avoiding triggers for her stress she states occasionally uses nerve medication but not frequently. She does not one of the on an antidepressant.  Patient with history of osteoporosis been on medications in the past but cannot tolerate oral biphosphonate's Pt states no concerns today.     Review of Systems     Objective:   Physical Exam  lungs are clear hearts were pulse normal extremities no edema neck no masses       Assessment & Plan:   stress levels are doing better patient is learning how to cope with it uses Xanax when symptoms are overwhelming otherwise does not feel like she needs nerve medicine on a regular basis denies needing antidepressant denies being depressed  Osteopenia with borderline osteoporosis patient will hold off on medications currently she is going to quit smoking continue calcium and vitamin D she is been on biphosphonate's for a proximally 4 years we will give a break in recheck bone density   Long discussion held about the importance of quitting smoking patient will try patches  We will also recommend pulmonary function tests with with pre-and post to be done later this year with follow-up office visit patient will call us when she is ready

## 2016-04-09 ENCOUNTER — Ambulatory Visit (INDEPENDENT_AMBULATORY_CARE_PROVIDER_SITE_OTHER): Payer: BC Managed Care – PPO | Admitting: Nurse Practitioner

## 2016-04-09 ENCOUNTER — Encounter: Payer: Self-pay | Admitting: Nurse Practitioner

## 2016-04-09 VITALS — BP 130/78 | Temp 98.9°F | Ht 62.0 in | Wt 100.4 lb

## 2016-04-09 DIAGNOSIS — K2100 Gastro-esophageal reflux disease with esophagitis, without bleeding: Secondary | ICD-10-CM

## 2016-04-09 MED ORDER — LANSOPRAZOLE 30 MG PO CPDR: 30.0000 mg | DELAYED_RELEASE_CAPSULE | Freq: Two times a day (BID) | ORAL | 2 refills | Status: DC

## 2016-04-09 MED ORDER — SUCRALFATE 1 GM/10ML PO SUSP: 1.0000 g | Freq: Three times a day (TID) | ORAL | 0 refills | Status: DC

## 2016-04-09 NOTE — Patient Instructions (Signed)
Food Choices for Gastroesophageal Reflux Disease, Adult When you have gastroesophageal reflux disease (GERD), the foods you eat and your eating habits are very important. Choosing the right foods can help ease the discomfort of GERD. WHAT GENERAL GUIDELINES DO I NEED TO FOLLOW?  Choose fruits, vegetables, whole grains, low-fat dairy products, and low-fat meat, fish, and poultry.  Limit fats such as oils, salad dressings, butter, nuts, and avocado.  Keep a food diary to identify foods that cause symptoms.  Avoid foods that cause reflux. These may be different for different people.  Eat frequent small meals instead of three large meals each day.  Eat your meals slowly, in a relaxed setting.  Limit fried foods.  Cook foods using methods other than frying.  Avoid drinking alcohol.  Avoid drinking large amounts of liquids with your meals.  Avoid bending over or lying down until 2-3 hours after eating. WHAT FOODS ARE NOT RECOMMENDED? The following are some foods and drinks that may worsen your symptoms: Vegetables Tomatoes. Tomato juice. Tomato and spaghetti sauce. Chili peppers. Onion and garlic. Horseradish. Fruits Oranges, grapefruit, and lemon (fruit and juice). Meats High-fat meats, fish, and poultry. This includes hot dogs, ribs, ham, sausage, salami, and bacon. Dairy Whole milk and chocolate milk. Sour cream. Cream. Butter. Ice cream. Cream cheese.  Beverages Coffee and tea, with or without caffeine. Carbonated beverages or energy drinks. Condiments Hot sauce. Barbecue sauce.  Sweets/Desserts Chocolate and cocoa. Donuts. Peppermint and spearmint. Fats and Oils High-fat foods, including French fries and potato chips. Other Vinegar. Strong spices, such as black pepper, white pepper, red pepper, cayenne, curry powder, cloves, ginger, and chili powder. The items listed above may not be a complete list of foods and beverages to avoid. Contact your dietitian for more  information.   This information is not intended to replace advice given to you by your health care provider. Make sure you discuss any questions you have with your health care provider.   Document Released: 07/26/2005 Document Revised: 08/16/2014 Document Reviewed: 05/30/2013 Elsevier Interactive Patient Education 2016 [employer]  

## 2016-04-10 ENCOUNTER — Encounter: Payer: Self-pay | Admitting: Nurse Practitioner

## 2016-04-10 NOTE — Progress Notes (Signed)
Subjective:  Presents for c/o burning pain in the chest area. Lasted about an hour. Radiated across upper chest into both arms. No SOB. No association with activity. Has not identified specific triggers. Saw Dr. Nicki Reaper on 6/5 for chest pain. Chest xray and EKG were normal. Has cut back on smoking. Slight relief with Prevacid. Drinks a large amount of caffeine. Symptoms began after eating spicy snack food. No N/V. No changes in bowels.   Objective:   BP 130/78   Temp 98.9 F (37.2 C) (Oral)   Ht 5\' 2"  (1.575 m)   Wt 100 lb 6 oz (45.5 kg)   BMI 18.36 kg/m  NAD. Alert, oriented. Lungs clear. Heart RRR. Abdomen soft, non distended with mild epigastric area tenderness. No rebound or guarding. No chest wall tenderness to palpation.   Assessment:  Problem List Items Addressed This Visit      Digestive   GERD (gastroesophageal reflux disease) - Primary   Relevant Medications   lansoprazole (PREVACID) 30 MG capsule   sucralfate (CARAFATE) 1 GM/10ML suspension    Other Visit Diagnoses   None.    Plan:  Meds ordered this encounter  Medications  . DISCONTD: lansoprazole (PREVACID) 30 MG capsule    Sig: Take 30 mg by mouth daily at 12 noon.  . lansoprazole (PREVACID) 30 MG capsule    Sig: Take 1 capsule (30 mg total) by mouth 2 (two) times daily before a meal.    Dispense:  60 capsule    Refill:  2    Order Specific Question:   Supervising Provider    Answer:   Mikey Kirschner [[id number]]  . sucralfate (CARAFATE) 1 GM/10ML suspension    Sig: Take 10 mLs (1 g total) by mouth 4 (four) times daily -  with meals and at bedtime.    Dispense:  420 mL    Refill:  0    Order Specific Question:   Supervising Provider    Answer:   Mikey Kirschner [[id number]]   Decrease caffeine intake. Continue to decrease tobacco use. Increase Protonix to BID for a few weeks. carafate or maalox as directed prn. Call back in 2-3 weeks if no improvement, sooner if worse.

## 2016-04-13 ENCOUNTER — Encounter (HOSPITAL_COMMUNITY): Payer: Self-pay | Admitting: *Deleted

## 2016-04-13 ENCOUNTER — Emergency Department (HOSPITAL_COMMUNITY)
Admission: EM | Admit: 2016-04-13 | Discharge: 2016-04-13 | Disposition: A | Discharge status: Home / Self Care | Payer: BC Managed Care – PPO | Attending: Emergency Medicine | Admitting: Emergency Medicine

## 2016-04-13 ENCOUNTER — Emergency Department (HOSPITAL_COMMUNITY): Payer: BC Managed Care – PPO

## 2016-04-13 DIAGNOSIS — R0789 Other chest pain: Secondary | ICD-10-CM

## 2016-04-13 DIAGNOSIS — R079 Chest pain, unspecified: Secondary | ICD-10-CM | POA: Diagnosis present

## 2016-04-13 DIAGNOSIS — K219 Gastro-esophageal reflux disease without esophagitis: Secondary | ICD-10-CM | POA: Diagnosis not present

## 2016-04-13 DIAGNOSIS — E876 Hypokalemia: Secondary | ICD-10-CM | POA: Diagnosis not present

## 2016-04-13 DIAGNOSIS — F17210 Nicotine dependence, cigarettes, uncomplicated: Secondary | ICD-10-CM | POA: Diagnosis not present

## 2016-04-13 LAB — CBC WITH DIFFERENTIAL/PLATELET
BASOS ABS: 0 10*3/uL (ref 0.0–0.1)
BASOS PCT: 0 %
Eosinophils Absolute: 0.3 10*3/uL (ref 0.0–0.7)
Eosinophils Relative: 3 %
HEMATOCRIT: 37.9 % (ref 36.0–46.0)
HEMOGLOBIN: 12.4 g/dL (ref 12.0–15.0)
LYMPHS PCT: 28 %
Lymphs Abs: 2.1 10*3/uL (ref 0.7–4.0)
MCH: 29.6 pg (ref 26.0–34.0)
MCHC: 32.7 g/dL (ref 30.0–36.0)
MCV: 90.5 fL (ref 78.0–100.0)
MONO ABS: 0.6 10*3/uL (ref 0.1–1.0)
Monocytes Relative: 8 %
NEUTROS ABS: 4.7 10*3/uL (ref 1.7–7.7)
NEUTROS PCT: 61 %
Platelets: 242 10*3/uL (ref 150–400)
RBC: 4.19 MIL/uL (ref 3.87–5.11)
RDW: 13.4 % (ref 11.5–15.5)
WBC: 7.7 10*3/uL (ref 4.0–10.5)

## 2016-04-13 LAB — COMPREHENSIVE METABOLIC PANEL
ALT: 14 U/L (ref 14–54)
AST: 18 U/L (ref 15–41)
Albumin: 4.3 g/dL (ref 3.5–5.0)
Alkaline Phosphatase: 58 U/L (ref 38–126)
Anion gap: 9 (ref 5–15)
BUN: 15 mg/dL (ref 6–20)
CHLORIDE: 106 mmol/L (ref 101–111)
CO2: 27 mmol/L (ref 22–32)
CREATININE: 0.43 mg/dL — AB (ref 0.44–1.00)
Calcium: 9.3 mg/dL (ref 8.9–10.3)
GFR calc Af Amer: >60 mL/min (ref >60)
GFR calc non Af Amer: >60 mL/min (ref >60)
Glucose, Bld: 106 mg/dL — ABNORMAL HIGH (ref 65–99)
POTASSIUM: 3 mmol/L — AB (ref 3.5–5.1)
SODIUM: 142 mmol/L (ref 135–145)
Total Bilirubin: 0.3 mg/dL (ref 0.3–1.2)
Total Protein: 6.5 g/dL (ref 6.5–8.1)

## 2016-04-13 LAB — TROPONIN I: Troponin I: <0.03 ng/mL (ref <0.03)

## 2016-04-13 LAB — LIPASE, BLOOD: Lipase: 20 U/L (ref 11–51)

## 2016-04-13 MED ORDER — GI COCKTAIL ~~LOC~~
30.0000 mL | Freq: Once | ORAL | Status: AC
Start: 2016-04-13 — End: 2016-04-13
  Administered 2016-04-13: 30 mL via ORAL
  Filled 2016-04-13: qty 30

## 2016-04-13 MED ORDER — POTASSIUM CHLORIDE CRYS ER 20 MEQ PO TBCR: 20.0000 meq | EXTENDED_RELEASE_TABLET | Freq: Two times a day (BID) | ORAL | 0 refills | Status: DC

## 2016-04-13 MED ORDER — FAMOTIDINE IN NACL 20-0.9 MG/50ML-% IV SOLN
20.0000 mg | Freq: Once | INTRAVENOUS | Status: AC
  Administered 2016-04-13: 20 mg via INTRAVENOUS
  Filled 2016-04-13: qty 50

## 2016-04-13 MED ORDER — SODIUM CHLORIDE 0.9 % IV BOLUS (SEPSIS)
500.0000 mL | Freq: Once | INTRAVENOUS | Status: AC
  Administered 2016-04-13: 500 mL via INTRAVENOUS

## 2016-04-13 MED ORDER — PANTOPRAZOLE SODIUM 20 MG PO TBEC: 20.0000 mg | DELAYED_RELEASE_TABLET | Freq: Two times a day (BID) | ORAL | 0 refills | Status: DC

## 2016-04-13 MED ORDER — POTASSIUM CHLORIDE CRYS ER 20 MEQ PO TBCR
40.0000 meq | EXTENDED_RELEASE_TABLET | Freq: Once | ORAL | Status: AC
  Administered 2016-04-13: 40 meq via ORAL
  Filled 2016-04-13: qty 2

## 2016-04-13 NOTE — ED Triage Notes (Signed)
Pt c/o "burning" sensation to left chest and down both arms that woke her up 40 mins pta; pt states the pain initially started yesterday morning and the pain has been intermittent; pt states she went to see her PCP x 4 days ago and was diagnosed with acid reflux

## 2016-04-13 NOTE — ED Provider Notes (Signed)
[facility] DEPT Provider Note   CSN: [id number] Arrival date & time: 04/13/16  0130     History   Chief Complaint Chief Complaint  Patient presents with  . Chest Pain   Time seen 01:45 AM  HPI Isabel Atkinson is a 50 y.o. female.  HPI patient reports she's been having burning in her chest for the past 2 weeks that's getting progressively worse. She was seen by her primary care doctor on September 1 and was started on Carafate and Prevacid twice a day. She states she has been having symptoms off and on all day. They are not related to exertion. It can happen even if she's doing nothing. She states she has diffuse discomfort across her whole chest that goes down both her shoulders into both of her arms. She states that is also a burning sensation. She states that last about 45 minutes at a time. Nothing she does makes it feel better, nothing she does makes it feel worse. She also describes a burning sensation in her throat. She has shortness of breath at times and some nausea that comes and goes but no vomiting, no diaphoresis, or abdominal pain.  Family history her mother is alive at age 45 and has atrial fib, father died at age 39 he had congestive heart failure, stroke, coronary artery disease with bypass surgery, and mechanical valve replacement she states she has been evaluated by Dr. Harrington Challenger in the past when she was having problems with her heart racing up to 150. She had a holter monitor.  She is currently on no medications and states they diagnosed her as having panic attacks during these episodes.  She states her last endoscopy and colonoscopy was about 5 or 6 years ago and does not recall anything being abnormal.   PCP Dr Wolfgang Phoenix GI Dr Gala Romney Cardiology Dr Lizbeth Bark  Past Medical History:  Diagnosis Date  . Anxiety   . Pre-diabetes   . Reactive airway disease     Patient Active Problem List   Diagnosis Date Noted  . Gastritis 04/25/2015  . Osteoporosis 07/20/2014  .  Irritable bowel syndrome 07/05/2014  . Nausea 01/11/2012  . Dysphagia 01/11/2012  . LUQ pain 01/11/2012  . GERD (gastroesophageal reflux disease) 01/11/2012  . Chronic diarrhea 01/11/2012    Past Surgical History:  Procedure Laterality Date  . COLONOSCOPY  2013   RMR: Normal rectum. single diminutive sigmoid polyp noted above. Normal terminal ileum. Status post segmental biopsy.   . ESOPHAGOGASTRODUODENOSCOPY  2013   RMR: Possible cervical esophageal web-status post dilation as described above. Small hiatal hernia. Status post biopsy of normal appearing small bowel to screen for celiac disease.   Marland Kitchen NECK SURGERY  10/2012  . OVARIAN CYST SURGERY  1997   removed  . TUBAL LIGATION  2000    OB History    No data available       Home Medications    Prior to Admission medications   Medication Sig Start Date End Date Taking? Authorizing Provider  ALPRAZolam (XANAX) 0.25 MG tablet TAKE 1/2 TO 1 TABLET BY MOUTH TWICE DAILY AS NEEDED FOR ANXIETY. 02/27/16   Kathyrn Drown, MD  Calcium-Vitamin D-Vitamin K (CALCIUM SOFT CHEWS) 500-500-40 MG-UNT-MCG CHEW Chew 2 each by mouth daily.    Historical Provider, MD  cycloSPORINE (RESTASIS) 0.05 % ophthalmic emulsion Place 1 drop into both eyes 2 (two) times daily. For dry eyes    Historical Provider, MD  lansoprazole (PREVACID) 30 MG capsule Take 1  capsule (30 mg total) by mouth 2 (two) times daily before a meal. 04/09/16   Nilda Simmer, NP  Multiple Vitamins-Minerals (MULTIVITAMIN WITH MINERALS) tablet Take 1 tablet by mouth daily.    Historical Provider, MD  pantoprazole (PROTONIX) 20 MG tablet Take 1 tablet (20 mg total) by mouth 2 (two) times daily before a meal. 04/13/16   Rolland Porter, MD  ranitidine (ZANTAC) 150 MG tablet Takes 1 BID Patient not taking: Reported on 04/09/2016 09/26/15   Kathyrn Drown, MD  sucralfate (CARAFATE) 1 GM/10ML suspension Take 10 mLs (1 g total) by mouth 4 (four) times daily -  with meals and at bedtime. 04/09/16   Nilda Simmer, NP  valACYclovir (VALTREX) 1000 MG tablet Take 2 tabs po BID for 1 day for cold sores 08/14/14   Kathyrn Drown, MD    Family History Family History  Problem Relation Age of Onset  . Colon cancer Maternal Grandfather 61  . Colon polyps Mother 62  . Colon cancer Maternal Uncle 33  . Hypertension Father   . Diabetes Father   . Heart disease Father   . Hyperlipidemia Father     Social History Social History  Substance Use Topics  . Smoking status: Current Every Day Smoker    Packs/day: 1.00    Years: 25.00    Types: Cigarettes  . Smokeless tobacco: Never Used  . Alcohol use No  employed   Allergies   Celexa [citalopram hydrobromide]; Chantix [varenicline]; Sulfa antibiotics; Wellbutrin [bupropion]; and Fosamax [alendronate sodium]   Review of Systems Review of Systems  All other systems reviewed and are negative.    Physical Exam Updated Vital Signs BP 144/73 (BP Location: Left Arm)   Pulse 60   Temp 98.2 F (36.8 C)   Resp 14   Ht 5\' 2"  (1.575 m)   Wt 100 lb (45.4 kg)   SpO2 98%   BMI 18.29 kg/m   Vital signs normal    Physical Exam  Constitutional: She is oriented to person, place, and time. She appears well-developed and well-nourished.  Non-toxic appearance. She does not appear ill. No distress.  HENT:  Head: Normocephalic and atraumatic.  Right Ear: External ear normal.  Left Ear: External ear normal.  Nose: Nose normal. No mucosal edema or rhinorrhea.  Mouth/Throat: Oropharynx is clear and moist and mucous membranes are normal. No dental abscesses or uvula swelling.  Eyes: Conjunctivae and EOM are normal. Pupils are equal, round, and reactive to light.  Neck: Normal range of motion and full passive range of motion without pain. Neck supple.  Cardiovascular: Normal rate, regular rhythm and normal heart sounds.  Exam reveals no gallop and no friction rub.   No murmur heard. Pulmonary/Chest: Effort normal and breath sounds normal. No  respiratory distress. She has no wheezes. She has no rhonchi. She has no rales. She exhibits no tenderness and no crepitus.  Abdominal: Soft. Normal appearance and bowel sounds are normal. She exhibits no distension. There is tenderness in the epigastric area. There is no rebound and no guarding.  Mild epigastric tenderness to palpation  Musculoskeletal: Normal range of motion. She exhibits no edema or tenderness.  Moves all extremities well.   Neurological: She is alert and oriented to person, place, and time. She has normal strength. No cranial nerve deficit.  Skin: Skin is warm, dry and intact. No rash noted. No erythema. No pallor.  Psychiatric: She has a normal mood and affect. Her speech is normal and behavior  is normal. Her mood appears not anxious.  Nursing note and vitals reviewed.    ED Treatments / Results  Labs (all labs ordered are listed, but only abnormal results are displayed) Results for orders placed or performed during the hospital encounter of 04/13/16  Comprehensive metabolic panel  Result Value Ref Range   Sodium 142 135 - 145 mmol/L   Potassium 3.0 (L) 3.5 - 5.1 mmol/L   Chloride 106 101 - 111 mmol/L   CO2 27 22 - 32 mmol/L   Glucose, Bld 106 (H) 65 - 99 mg/dL   BUN 15 6 - 20 mg/dL   Creatinine, Ser 0.43 (L) 0.44 - 1.00 mg/dL   Calcium 9.3 8.9 - 10.3 mg/dL   Total Protein 6.5 6.5 - 8.1 g/dL   Albumin 4.3 3.5 - 5.0 g/dL   AST 18 15 - 41 U/L   ALT 14 14 - 54 U/L   Alkaline Phosphatase 58 38 - 126 U/L   Total Bilirubin 0.3 0.3 - 1.2 mg/dL   GFR calc non Af Amer >60 >60 mL/min   GFR calc Af Amer >60 >60 mL/min   Anion gap 9 5 - 15  Lipase, blood  Result Value Ref Range   Lipase 20 11 - 51 U/L  Troponin I  Result Value Ref Range   Troponin I <0.03 <0.03 ng/mL  CBC with Differential  Result Value Ref Range   WBC 7.7 4.0 - 10.5 K/uL   RBC 4.19 3.87 - 5.11 MIL/uL   Hemoglobin 12.4 12.0 - 15.0 g/dL   HCT 37.9 36.0 - 46.0 %   MCV 90.5 78.0 - 100.0 fL   MCH  29.6 26.0 - 34.0 pg   MCHC 32.7 30.0 - 36.0 g/dL   RDW 13.4 11.5 - 15.5 %   Platelets 242 150 - 400 K/uL   Neutrophils Relative % 61 %   Neutro Abs 4.7 1.7 - 7.7 K/uL   Lymphocytes Relative 28 %   Lymphs Abs 2.1 0.7 - 4.0 K/uL   Monocytes Relative 8 %   Monocytes Absolute 0.6 0.1 - 1.0 K/uL   Eosinophils Relative 3 %   Eosinophils Absolute 0.3 0.0 - 0.7 K/uL   Basophils Relative 0 %   Basophils Absolute 0.0 0.0 - 0.1 K/uL  Troponin I  Result Value Ref Range   Troponin I <0.03 <0.03 ng/mL   Laboratory interpretation all normal except for hypokalemia    EKG ED ECG REPORT   Date: 04/13/2016  Rate: 83  Rhythm: normal sinus rhythm  QRS Axis: normal  Intervals: normal  ST/T Wave abnormalities: normal  Conduction Disutrbances:none  Narrative Interpretation:   Old EKG Reviewed: none available  I have personally reviewed the EKG tracing and agree with the computerized printout as noted.   Radiology Dg Chest Port 1 View  Result Date: 04/13/2016 CLINICAL DATA:  Acute onset of generalized chest pain. Initial encounter. EXAM: PORTABLE CHEST 1 VIEW COMPARISON:  Chest radiograph performed 01/14/2016 FINDINGS: The lungs are well-aerated and clear. There is no evidence of focal opacification, pleural effusion or pneumothorax. The cardiomediastinal silhouette is within normal limits. No acute osseous abnormalities are seen. Cervical spinal fusion hardware is noted. Scattered clips are seen overlying the left thyroid bed. IMPRESSION: No acute cardiopulmonary process seen. Electronically Signed   By: Garald Balding M.D.   On: 04/13/2016 02:46    Procedures Procedures (including critical care time)  Medications Ordered in ED Medications  sodium chloride 0.9 % bolus 500 mL (0 mLs Intravenous  Stopped 04/13/16 0315)  famotidine (PEPCID) IVPB 20 mg premix (0 mg Intravenous Stopped 04/13/16 0312)  gi cocktail (Maalox,Lidocaine,Donnatal) (30 mLs Oral Given 04/13/16 0229)  potassium chloride SA  (K-DUR,KLOR-CON) CR tablet 40 mEq (40 mEq Oral Given 04/13/16 0314)     Initial Impression / Assessment and Plan / ED Course  I have reviewed the triage vital signs and the nursing notes.  Pertinent labs & imaging results that were available during my care of the patient were reviewed by me and considered in my medical decision making (see chart for details).  Clinical Course   Patient was given IV Pepcid and a GI cocktail for her chest discomfort. Her symptoms did get a little better.   When I reviewed her lab work she was given oral potassium for her hypokalemia.  3:40 AM We discussed getting a delta troponin to rule out acute myocardial event tonight and she is agreeable. Her delta troponin was negative.  At time of discharge she was informed her delta troponin was negative. We talked about seeing her gastroenterologist, her symptoms are getting worse on appropriate medication, she may need a repeat endoscopy to see what is going on. I did change her Prevacid to Protonix to see if that would work better. She states she's been on multiple PPIs, including dexilant without significant difference in efficacy. She was also given information about dietary recommendations with GERD. She was discharged home with oral potassium for several days. She should let her primary care doctor office no to follow that up in the next couple weeks. We also discussed seeing her cardiologist to get further cardiac testing due to her family history.  Final Clinical Impressions(s) / ED Diagnoses   Final diagnoses:  Hypokalemia  Atypical chest pain  Gastroesophageal reflux disease without esophagitis    New Prescriptions New Prescriptions   PANTOPRAZOLE (PROTONIX) 20 MG TABLET    Take 1 tablet (20 mg total) by mouth 2 (two) times daily before a meal.    Plan discharge  Rolland Porter, MD, Barbette Or, MD 04/13/16 [phone]

## 2016-04-13 NOTE — Discharge Instructions (Signed)
Look at the information about gastroesophageal disease and diet in case you are not following those suggestions. You can try stopping the Prevacid and take the Protonix to see if it will work better for you. Continue the Carafate. It might be good to call Dr. Gala Romney To have him evaluate you since her symptoms are getting worse, also with the chest pain in your family history it might be good to see Dr. Harrington Challenger and get a stress test to further evaluate her chest pain. However tonight you are not having a heart attack!  Recheck if you get worse.  Take the potassium pills until gone. Let Dr Lance Sell office know your potassium was 3.0 tonight. He may want to recheck it again in a few weeks to make sure it is staying in the normal range.

## 2016-04-14 ENCOUNTER — Ambulatory Visit (INDEPENDENT_AMBULATORY_CARE_PROVIDER_SITE_OTHER): Payer: BC Managed Care – PPO | Admitting: Family Medicine

## 2016-04-14 ENCOUNTER — Encounter: Payer: Self-pay | Admitting: Family Medicine

## 2016-04-14 VITALS — BP 118/72 | Ht 62.0 in | Wt 100.0 lb

## 2016-04-14 DIAGNOSIS — R0789 Other chest pain: Secondary | ICD-10-CM | POA: Diagnosis not present

## 2016-04-14 NOTE — Progress Notes (Signed)
   Subjective:    Patient ID: Isabel Atkinson, female    DOB: [birth year], 50 y.o.   MRN: [id number]  HPI  Patient arrives for a follow up fro the ER for reflux. Patient states she was seen Friday for burning in her chest and diagnosed with reflux. Burning got worse and went into her arms and patient went to the ER yesterday and had a full cardiac work up and was cleared from cardiac standpoint and given a GI cocktail that helped until today at work when patient was doing a lot of bending forward the burning returned for a time. 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 but she does relate intermittent chest pressure and burning with pain into both arms it woke her up once and it occurred several times at rest she went to the ER because of this She also relates some symptoms of reflux where liquids come up in the back of her throat and burning Review of Systems     Objective:   Physical Exam  Lungs clear hearts regular pulse normal chest wall normal reflexes good strength bilateral good neck no masses ER records including chest x-ray EKG lab work reviewed in detail     Assessment & Plan:  #1 tobacco abuse patient was counseled to quit smoking to lessen her risk for heart disease #2 reflux issues continue PPI but some of her symptoms also go along with the possibility of cardiac disease we need to look into cardiac issues first. Once cardiac issues are fully addressed if everything going well regarding cardiac then will refer to gastroenterology for possibility of EGD #3 will refer patient back to her cardiologist I believe that she would benefit from having a stress test to be certain that there is not underlying blockages developing patient does have history of smoking as well as family history of premature heart disease patient's previous cholesterol looks good #4 patient overuses  caffeine she needs to taper down over the course of the next couple weeks to one can or less of soda per day

## 2016-04-19 ENCOUNTER — Encounter: Payer: Self-pay | Admitting: Family Medicine

## 2016-04-19 ENCOUNTER — Telehealth: Payer: Self-pay | Admitting: Family Medicine

## 2016-04-19 ENCOUNTER — Ambulatory Visit (INDEPENDENT_AMBULATORY_CARE_PROVIDER_SITE_OTHER): Payer: BC Managed Care – PPO | Admitting: Family Medicine

## 2016-04-19 VITALS — BP 134/76 | Temp 98.6°F | Wt 99.4 lb

## 2016-04-19 DIAGNOSIS — K2100 Gastro-esophageal reflux disease with esophagitis, without bleeding: Secondary | ICD-10-CM

## 2016-04-19 DIAGNOSIS — J029 Acute pharyngitis, unspecified: Secondary | ICD-10-CM | POA: Diagnosis not present

## 2016-04-19 DIAGNOSIS — J019 Acute sinusitis, unspecified: Secondary | ICD-10-CM | POA: Diagnosis not present

## 2016-04-19 LAB — POCT RAPID STREP A (OFFICE): Rapid Strep A Screen: NEGATIVE

## 2016-04-19 MED ORDER — DEXLANSOPRAZOLE 60 MG PO CPDR: 60.0000 mg | DELAYED_RELEASE_CAPSULE | Freq: Every day | ORAL | 4 refills | Status: DC

## 2016-04-19 MED ORDER — AZITHROMYCIN 250 MG PO TABS: ORAL_TABLET | ORAL | 0 refills | Status: DC

## 2016-04-19 NOTE — Progress Notes (Signed)
   Subjective:    Patient ID: Isabel Atkinson, female    DOB: 1966/01/08, 50 y.o.   MRN: [id number]  Sore Throat   This is a recurrent problem.   Burning in throat and white spots in throat.  Burning in chest and arms. Sees cardiologist this week. Was seen [facility] since [facility] visit.   Taking dexilant 30mg  once daily. Pt wants to try to increase bid.   Review of Systems Patient relates burning in throat burning in the chest burning into the arms doesn't feel good feels a little short of breath denies chest pressure or tightness. Denies fever vomiting diarrhea. Patient still smokes but she is trying to quit.    Objective:   Physical Exam Throat erythematous with exudate neck no masses lungs clear heart regular       Assessment & Plan:  Possible reflux disease causing the burning increase PPI to 60 mg Viral pharyngitis versus possible strep treat with Z-Pak strep test negative but could be a false-negative Patient is seen cardiology this coming week with the patient having ongoing burning in the chest it goes into the arms and the patient having risk factors for heart disease along with symptoms of reflux I believe several things need to take place #1 hopefully cardiology will do a stress test to be certain that the patient does not have underlying heart disease #2 if cardiac workup negative then we'll set patient up with gastroenterology for EGD. #3 if ongoing troubles consider stress as a source

## 2016-04-19 NOTE — Telephone Encounter (Signed)
Patient seen on 04/14/2016 for chest pain and she said that Dr. Nicki Reaper thought the burning in her throat was acid reflux.  She said she has found 3 white spots in her throat and wants to know if Dr. Nicki Reaper thinks she needs to be tested for strep throat.  Please advise.

## 2016-04-19 NOTE — Telephone Encounter (Signed)
Transferred patient to front desk to schedule appointment.  

## 2016-04-21 ENCOUNTER — Telehealth (HOSPITAL_COMMUNITY): Payer: Self-pay | Admitting: *Deleted

## 2016-04-21 ENCOUNTER — Encounter: Payer: Self-pay | Admitting: Physician Assistant

## 2016-04-21 ENCOUNTER — Ambulatory Visit (INDEPENDENT_AMBULATORY_CARE_PROVIDER_SITE_OTHER): Payer: BC Managed Care – PPO | Admitting: Physician Assistant

## 2016-04-21 ENCOUNTER — Encounter: Payer: Self-pay | Admitting: *Deleted

## 2016-04-21 VITALS — BP 158/88 | HR 111 | Ht 62.0 in | Wt 97.4 lb

## 2016-04-21 DIAGNOSIS — R079 Chest pain, unspecified: Secondary | ICD-10-CM | POA: Diagnosis not present

## 2016-04-21 DIAGNOSIS — K219 Gastro-esophageal reflux disease without esophagitis: Secondary | ICD-10-CM

## 2016-04-21 DIAGNOSIS — R002 Palpitations: Secondary | ICD-10-CM | POA: Diagnosis not present

## 2016-04-21 MED ORDER — METOPROLOL SUCCINATE ER 25 MG PO TB24: 12.5000 mg | ORAL_TABLET | Freq: Every day | ORAL | 3 refills | Status: DC

## 2016-04-21 NOTE — Telephone Encounter (Signed)
Patient given detailed instructions per Myocardial Perfusion Study Information Sheet for the test on 04/22/16 at 10:00. Patient notified to arrive 15 minutes early and that it is imperative to arrive on time for appointment to keep from having the test rescheduled.  If you need to cancel or reschedule your appointment, please call the office within 24 hours of your appointment. Failure to do so may result in a cancellation of your appointment, and a $50 no show fee. Patient verbalized understanding.Isabel Atkinson

## 2016-04-21 NOTE — Progress Notes (Signed)
Cardiology Office Note    Date:  04/21/2016   ID:  Isabel Atkinson, DOB 1966/01/24, MRN [id number]  PCP:  Sallee Lange, MD  Cardiologist: Dr. Wyatt Haste  Chief Complaint  Patient presents with  . Chest Pain    History of Present Illness:  Isabel Atkinson is a 50 y.o. female with history of palpitations. She was last seen by Dr. Harrington Challenger 06/2015 at which time she was complaining of her heart rate going up to 150 bpm. Holter monitor was placed and showed no arrhythmias. She was prescribed low-dose Toprol-XL 25 mg one half daily to increase to 1 tablet daily. Patient never started this.  Was in ED 04/13/16 with burning chest pain for 2 weeks that progressively worsened. Not exertional. Diffuse across her chest down both arms lasting about 45 minutes. Mother is alive at 53 with atrial fibrillation father died at 45 with CHF and had CABG and mechanical heart valve. She was given IV Pepcid and GI cocktail with some improvement. Troponin was negative potassium was given for hypokalemia.  Saw primary M.D. 04/19/16 with some chest burning felt possibly due to reflux and PPI was increased. He felt a stress test would help discern what's causing her pain if it was negative he set her up with GI and if ongoing troubles consider stresses source.  Patient says she was vacuuming this am and had chest burning into arms and neck and short of breath.still has a little bit of tightness in the office. EKG normal sinus rhythm at 100 bpm nonspecific ST-T wave changes, no acute change. Patient also having increased heart rates up to 112 bpm just walking around her house. She was drinking 4 cans of caffeinated soda daily and is trying to cut back. She also smokes a pack of cigarettes daily and is trying to decrease this. She also has a lot of reflux symptoms that into her throat but has improved some with her new GI meds    Past Medical History:  Diagnosis Date  . Anxiety   . Pre-diabetes   . Reactive airway  disease     Past Surgical History:  Procedure Laterality Date  . COLONOSCOPY  2013   RMR: Normal rectum. single diminutive sigmoid polyp noted above. Normal terminal ileum. Status post segmental biopsy.   . ESOPHAGOGASTRODUODENOSCOPY  2013   RMR: Possible cervical esophageal web-status post dilation as described above. Small hiatal hernia. Status post biopsy of normal appearing small bowel to screen for celiac disease.   Marland Kitchen NECK SURGERY  10/2012  . OVARIAN CYST SURGERY  1997   removed  . TUBAL LIGATION  2000    Current Medications: Outpatient Medications Prior to Visit  Medication Sig Dispense Refill  . ALPRAZolam (XANAX) 0.25 MG tablet TAKE 1/2 TO 1 TABLET BY MOUTH TWICE DAILY AS NEEDED FOR ANXIETY. 30 tablet 2  . azithromycin (ZITHROMAX Z-PAK) 250 MG tablet Take 2 tablets (500 mg) on  Day 1,  followed by 1 tablet (250 mg) once daily on Days 2 through 5. 6 each 0  . Calcium-Vitamin D-Vitamin K (CALCIUM SOFT CHEWS) 500-500-40 MG-UNT-MCG CHEW Chew 2 each by mouth daily.    . cycloSPORINE (RESTASIS) 0.05 % ophthalmic emulsion Place 1 drop into both eyes 2 (two) times daily. For dry eyes    . dexlansoprazole (DEXILANT) 60 MG capsule Take 1 capsule (60 mg total) by mouth daily. 30 capsule 4  . Multiple Vitamins-Minerals (MULTIVITAMIN WITH MINERALS) tablet Take 1 tablet by mouth daily.    Marland Kitchen  potassium chloride SA (K-DUR,KLOR-CON) 20 MEQ tablet Take 1 tablet (20 mEq total) by mouth 2 (two) times daily. 8 tablet 0  . valACYclovir (VALTREX) 1000 MG tablet Take 2 tabs po BID for 1 day for cold sores 4 tablet 12  . potassium chloride SA (K-DUR,KLOR-CON) 20 MEQ tablet Take 1 tablet (20 mEq total) by mouth 2 (two) times daily. 8 tablet 0   No facility-administered medications prior to visit.      Allergies:   Carafate [sucralfate]; Celexa [citalopram hydrobromide]; Chantix [varenicline]; Sulfa antibiotics; Wellbutrin [bupropion]; and Fosamax [alendronate sodium]   Social History   Social History   . Marital status: Married    Spouse name: N/A  . Number of children: 2  . Years of education: N/A   Occupational History  . [employer]   Social History Main Topics  . Smoking status: Current Every Day Smoker    Packs/day: 1.00    Years: 25.00    Types: Cigarettes  . Smokeless tobacco: Never Used  . Alcohol use No  . Drug use: No  . Sexual activity: Yes    Birth control/ protection: Post-menopausal, Surgical   Other Topics Concern  . Not on file   Social History Narrative   2 children -21/13     Family History:  The patient's   family history includes Colon cancer (age of onset: 33) in her maternal uncle; Colon cancer (age of onset: 87) in her maternal grandfather; Colon polyps (age of onset: 69) in her mother; Diabetes in her father; Heart disease in her father; Hyperlipidemia in her father; Hypertension in her father.   ROS:   Please see the history of present illness.    Review of Systems  Constitution: Negative.  HENT: Positive for headaches.   Eyes: Negative.   Cardiovascular: Positive for chest pain, dyspnea on exertion and palpitations.  Respiratory: Negative.   Hematologic/Lymphatic: Negative.   Musculoskeletal: Negative.  Negative for joint pain.  Gastrointestinal: Negative.   Genitourinary: Negative.    All other systems reviewed and are negative.   PHYSICAL EXAM:   VS:  BP (!) 158/88   Pulse (!) 111   Ht 5\' 2"  (1.575 m)   Wt 97 lb 6.4 oz (44.2 kg)   SpO2 98%   BMI 17.81 kg/m   Physical Exam  GEN: Well nourished, well developed, in no acute distress  Neck: no JVD, carotid bruits, or masses Cardiac:RRR; no murmurs, rubs, or gallops  Respiratory:  clear to auscultation bilaterally, normal work of breathing GI: soft, nontender, nondistended, + BS Ext: without cyanosis, clubbing, or edema, Good distal pulses bilaterally MS: no deformity or atrophy  Psych: euthymic mood, full affect  Wt Readings from Last 3 Encounters:    04/21/16 97 lb 6.4 oz (44.2 kg)  04/19/16 99 lb 6.4 oz (45.1 kg)  04/14/16 100 lb (45.4 kg)      Studies/Labs Reviewed:   EKG:  EKG is ordered today.  The ekg ordered today demonstrates Normal sinus rhythm at 100 bpm with nonspecific ST-T wave changes, no acute change  Recent Labs: 01/14/2016: Magnesium 2.1; TSH 1.030 04/13/2016: ALT 14; BUN 15; Creatinine, Ser 0.43; Hemoglobin 12.4; Platelets 242; Potassium 3.0; Sodium 142   Lipid Panel    Component Value Date/Time   CHOL 191 04/25/2015 0928   TRIG 89 04/25/2015 0928   HDL 76 04/25/2015 0928   CHOLHDL 2.5 04/25/2015 0928   CHOLHDL 2.5 11/03/2013 0951   VLDL 13 11/03/2013 0951   LDLCALC  97 04/25/2015 0928    Additional studies/ records that were reviewed today include:      ASSESSMENT:    1. Chest pain, unspecified chest pain type   2. Palpitation   3. Gastroesophageal reflux disease, esophagitis presence not specified      PLAN:  In order of problems listed above:  Chest pain and her neck and arms with several cardiac risk factors for CAD including family history, smoking greater than 30 years. Will order a nuclear stress test. Add low-dose metoprolol 12.5 mg daily to see if this helps. Hold day of the stress test. Follow-up with Dr. Harrington Challenger.   Palpitations patient has been checking her heart rate and is been in the 112 area with just walking around her house. We'll add low-dose metoprolol. She has cut back on her caffeine already.  GERD patient's symptoms improved some with new PPI but she still having a lot of symptoms. Follow-up with Dr. Wolfgang Phoenix      Medication Adjustments/Labs and Tests Ordered: Current medicines are reviewed at length with the patient today.  Concerns regarding medicines are outlined above.  Medication changes, Labs and Tests ordered today are listed in the Patient Instructions below. Patient Instructions  Your physician recommends that you schedule a follow-up appointment in: With Dr. Harrington Challenger in  November   Your physician has recommended you make the following change in your medication: Start Toprol       Signed, Ermalinda Barrios, PA-C  04/21/2016 1:28 PM    [facility] [address], [place], [place]  [address] Phone: [phone]; Fax: [phone]

## 2016-04-21 NOTE — Telephone Encounter (Signed)
Left message on voicemail in reference to upcoming appointment scheduled for 04/22/16. Phone number given for a call back so details instructions can be given. Veronia Beets

## 2016-04-21 NOTE — Patient Instructions (Addendum)
Your physician recommends that you schedule a follow-up appointment in: With Dr. Harrington Challenger in November   Your physician has recommended you make the following change in your medication: Start Toprol XL 12.5 mg Daily.   Your physician has requested that you have en exercise stress myoview. For further information please visit [email or web address]. Please follow instruction sheet, as given.   If you need a refill on your cardiac medications before your next appointment, please call your pharmacy.  Thank you for choosing [facility]!

## 2016-04-22 ENCOUNTER — Telehealth: Payer: Self-pay | Admitting: Family Medicine

## 2016-04-22 ENCOUNTER — Ambulatory Visit (HOSPITAL_COMMUNITY): Payer: BC Managed Care – PPO | Attending: Cardiology

## 2016-04-22 DIAGNOSIS — R079 Chest pain, unspecified: Secondary | ICD-10-CM | POA: Insufficient documentation

## 2016-04-22 DIAGNOSIS — R0789 Other chest pain: Secondary | ICD-10-CM

## 2016-04-22 LAB — MYOCARDIAL PERFUSION IMAGING
Estimated workload: 10.1 METS
Exercise duration (min): 8 min
Exercise duration (sec): 0 s
LV dias vol: 78 mL (ref 46–106)
LV sys vol: 36 mL
MPHR: 169 {beats}/min
Peak HR: 153 {beats}/min
Percent HR: 90 %
RATE: 0.27
Rest HR: 74 {beats}/min
SDS: 2
SRS: 0
SSS: 2
TID: 1.01

## 2016-04-22 MED ORDER — TECHNETIUM TC 99M TETROFOSMIN IV KIT
33.0000 | PACK | Freq: Once | INTRAVENOUS | Status: AC | PRN
  Administered 2016-04-22: 33 via INTRAVENOUS
  Filled 2016-04-22: qty 33

## 2016-04-22 MED ORDER — TECHNETIUM TC 99M TETROFOSMIN IV KIT
10.4000 | PACK | Freq: Once | INTRAVENOUS | Status: AC | PRN
  Administered 2016-04-22: 10 via INTRAVENOUS
  Filled 2016-04-22: qty 10

## 2016-04-22 NOTE — Telephone Encounter (Signed)
Isabel Atkinson wanted to let Dr. Nicki Reaper know that she had her stress test done this morning, but has not heard of the results yet.

## 2016-04-23 ENCOUNTER — Telehealth: Payer: Self-pay | Admitting: Family Medicine

## 2016-04-23 DIAGNOSIS — K219 Gastro-esophageal reflux disease without esophagitis: Secondary | ICD-10-CM

## 2016-04-23 NOTE — Telephone Encounter (Signed)
Please let the patient know her stress test looked good. More than likely cardiology will call her with all the details. I do recommend we move forward with working on referral to gastroenterology the next step patient will need them to see her for evaluation and probable EGD due to significant reflux issues. Patient is seen Dr.Rourk in the past

## 2016-04-23 NOTE — Telephone Encounter (Signed)
Study Highlights     Nuclear stress EF: 54%.  There was no ST segment deviation noted during stress.  No T wave inversion was noted during stress.  The study is normal.  This is a low risk study.   Low risk stress nuclear study with normal perfusion and normal left ventricular regional and global systolic function.

## 2016-04-23 NOTE — Telephone Encounter (Signed)
Pt had stress test yesterday but does not have results yet  Would like for you to extend leave from work until we know if it's heart related or get referred to gastro  Please advise

## 2016-04-23 NOTE — Telephone Encounter (Signed)
Discussed with patient. Patient advised that Dr Nicki Reaper states her stress test looked good. More than likely cardiology will call her with all the details. Dr Nicki Reaper does recommend we move forward with working on referral to gastroenterology the next step patient will need them to see her for evaluation and probable EGD due to significant reflux issues. Patient is seen Dr.Rourk in the past. Patient verbalized understanding. Referral ordered in [facility].

## 2016-05-04 ENCOUNTER — Other Ambulatory Visit: Payer: Self-pay

## 2016-05-04 ENCOUNTER — Encounter: Payer: Self-pay | Admitting: Gastroenterology

## 2016-05-04 ENCOUNTER — Ambulatory Visit (INDEPENDENT_AMBULATORY_CARE_PROVIDER_SITE_OTHER): Payer: BC Managed Care – PPO | Admitting: Gastroenterology

## 2016-05-04 VITALS — BP 127/76 | HR 71 | Temp 97.6°F | Ht 62.0 in | Wt 97.4 lb

## 2016-05-04 DIAGNOSIS — R1013 Epigastric pain: Secondary | ICD-10-CM | POA: Insufficient documentation

## 2016-05-04 DIAGNOSIS — K219 Gastro-esophageal reflux disease without esophagitis: Secondary | ICD-10-CM

## 2016-05-04 DIAGNOSIS — R1310 Dysphagia, unspecified: Secondary | ICD-10-CM

## 2016-05-04 NOTE — Progress Notes (Signed)
Primary Care Physician:  Sallee Lange, MD  Primary Gastroenterologist:  Garfield Cornea, MD   Chief Complaint  Patient presents with  . Gastroesophageal Reflux    HPI:  Isabel Atkinson is a 50 y.o. female here for further evaluation of suspected refractory GERD. Patient gives 3-4 week history of severe burning which starts in the epigastrium, goes up the chest and into her throat. She was also having burning in her arms. Nonexertional. Symptoms occur nocturnally, wake her up. She notices it at work. She works in [employer] at an [employer]. 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 She has been on Dexilant 30 mg daily, recently increased to twice a day when symptoms started. Actually tried 60 mg once daily but felt like 30 mg twice a day was more effective. She did not tolerate Carafate, developed tingling in her mouth. Currently using Gaviscon. She has cut back on her caffeine. Patient complains of difficulty swallowing bread, meat, Peanuts. In addition she has had some nocturnal loose bowel movement around 3 AM for short period time, this is now resolved. Back to once daily BM, soft to loose. No melena or rectal bleeding. She reports 3 pound weight loss with current illness.  She was seen in the emergency department September 5 with symptoms. She saw the cardiologist, stress testing which was unremarkable. Patient states she was put on medication (low-dose metoprolol) to slow her heart rate down.   She did receive Z-Pak approximately one week ago for possible pharyngitis. Strep test negative per patient. States that she had white spots in the back of her throat. On exam by PCP, she had erythematous pharynx with exudate.  No nsaids/asa. Ibuprofen in march. Kidney stones.       Current Outpatient Prescriptions  Medication Sig Dispense Refill  . ALPRAZolam (XANAX) 0.25 MG tablet TAKE 1/2 TO 1  TABLET BY MOUTH TWICE DAILY AS NEEDED FOR ANXIETY. 30 tablet 2  . Calcium-Vitamin D-Vitamin K (CALCIUM SOFT CHEWS) [id number] MG-UNT-MCG CHEW Chew 2 each by mouth daily.    . cycloSPORINE (RESTASIS) 0.05 % ophthalmic emulsion Place 1 drop into both eyes 2 (two) times daily. For dry eyes    . dexlansoprazole (DEXILANT) 60 MG capsule Take 1 capsule (60 mg total) by mouth daily. (Patient taking differently: Take 30 mg by mouth 2 (two) times daily. ) 30 capsule 4  . metoprolol succinate (TOPROL XL) 25 MG 24 hr tablet Take 0.5 tablets (12.5 mg total) by mouth daily. 45 tablet 3  . Multiple Vitamins-Minerals (MULTIVITAMIN WITH MINERALS) tablet Take 1 tablet by mouth daily.    . potassium chloride SA (K-DUR,KLOR-CON) 20 MEQ tablet Take 1 tablet (20 mEq total) by mouth 2 (two) times daily. 8 tablet 0  . UNABLE TO FIND GAVISON 2 TESP BID    . valACYclovir (VALTREX) 1000 MG tablet Take 2 tabs po BID for 1 day for cold sores 4 tablet 12   No current facility-administered medications for this visit.     Allergies as of 05/04/2016 - Review Complete 05/04/2016  Allergen Reaction Noted  . Carafate [sucralfate]  04/19/2016  . Celexa [citalopram hydrobromide]  02/27/2016  . Chantix [varenicline]  02/27/2016  . Sulfa antibiotics Itching 01/11/2012  . Wellbutrin [bupropion]  01/12/2016  . Fosamax [alendronate sodium] Nausea And Vomiting 11/09/2014    Past Medical History:  Diagnosis Date  . Anxiety   . Pre-diabetes   . Reactive  airway disease     Past Surgical History:  Procedure Laterality Date  . COLONOSCOPY  2013   RMR: Normal rectum. single diminutive sigmoid polyp noted above. Normal terminal ileum. Status post segmental biopsy. next TCS in 10 years.   . ESOPHAGOGASTRODUODENOSCOPY  2013   RMR: Possible cervical esophageal web-status post dilation as described above. Small hiatal hernia. Status post biopsy of normal appearing small bowel to screen for celiac disease.   Marland Kitchen NECK SURGERY  10/2012   . OVARIAN CYST SURGERY  1997   removed  . TUBAL LIGATION  2000    Family History  Problem Relation Age of Onset  . Colon cancer Maternal Grandfather 69  . Colon polyps Mother 32  . Colon cancer Maternal Uncle 67  . Hypertension Father   . Diabetes Father   . Heart disease Father   . Hyperlipidemia Father     Social History   Social History  . Marital status: Married    Spouse name: N/A  . Number of children: 2  . Years of education: N/A   Occupational History  . [employer]   Social History Main Topics  . Smoking status: Current Every Day Smoker    Packs/day: 1.00    Years: 25.00    Types: Cigarettes  . Smokeless tobacco: Never Used  . Alcohol use No  . Drug use: No  . Sexual activity: Yes    Birth control/ protection: Post-menopausal, Surgical   Other Topics Concern  . Not on file   Social History Narrative   2 children -21/13      ROS:  General: Negative for anorexia, weight loss, fever, chills, fatigue, weakness. Eyes: Negative for vision changes.  ENT: Negative for hoarseness, difficulty swallowing , nasal congestion. CV: Negative for chest pain, angina, palpitations, dyspnea on exertion, peripheral edema.  Respiratory: Negative for dyspnea at rest, dyspnea on exertion, cough, sputum, wheezing.  GI: See history of present illness. GU:  Negative for dysuria, hematuria, urinary incontinence, urinary frequency, nocturnal urination.  MS: Negative for joint pain, low back pain.  Derm: Negative for rash or itching.  Neuro: Negative for weakness, abnormal sensation, seizure, frequent headaches, memory loss, confusion.  Psych: Negative for anxiety, depression, suicidal ideation, hallucinations.  Endo: Negative for unusual weight change.  Heme: Negative for bruising or bleeding. Allergy: Negative for rash or hives.    Physical Examination:  BP 127/76   Pulse 71   Temp 97.6 F (36.4 C) (Oral)   Ht 5\' 2"  (1.575 m)   Wt 97 lb  6.4 oz (44.2 kg)   BMI 17.81 kg/m    General: Well-nourished, well-developed in no acute distress.  Head: Normocephalic, atraumatic.   Eyes: Conjunctiva pink, no icterus. Mouth: Oropharyngeal mucosa moist and pink , no lesions erythema or exudate. Bilateral tonsilloliths. Neck: Supple without thyromegaly, masses, or lymphadenopathy.  Lungs: Clear to auscultation bilaterally.  Heart: Regular rate and rhythm, no murmurs rubs or gallops.  Abdomen: Bowel sounds are normal, mild to moderate epigastric tenderness, nondistended, no hepatosplenomegaly or masses, no abdominal bruits or    hernia , no rebound or guarding.   Rectal: Not performed Extremities: No lower extremity edema. No clubbing or deformities.  Neuro: Alert and oriented x 4 , grossly normal neurologically.  Skin: Warm and dry, no rash or jaundice.   Psych: Alert and cooperative, normal mood and affect.  Labs: Lab Results  Component Value Date   CREATININE 0.43 (L) 04/13/2016   BUN 15 04/13/2016  NA 142 04/13/2016   K 3.0 (L) 04/13/2016   CL 106 04/13/2016   CO2 27 04/13/2016   Lab Results  Component Value Date   ALT 14 04/13/2016   AST 18 04/13/2016   ALKPHOS 58 04/13/2016   BILITOT 0.3 04/13/2016   Lab Results  Component Value Date   WBC 7.7 04/13/2016   HGB 12.4 04/13/2016   HCT 37.9 04/13/2016   MCV 90.5 04/13/2016   PLT 242 04/13/2016     Imaging Studies: Dg Chest Port 1 View  Result Date: 04/13/2016 CLINICAL DATA:  Acute onset of generalized chest pain. Initial encounter. EXAM: PORTABLE CHEST 1 VIEW COMPARISON:  Chest radiograph performed 01/14/2016 FINDINGS: The lungs are well-aerated and clear. There is no evidence of focal opacification, pleural effusion or pneumothorax. The cardiomediastinal silhouette is within normal limits. No acute osseous abnormalities are seen. Cervical spinal fusion hardware is noted. Scattered clips are seen overlying the left thyroid bed. IMPRESSION: No acute cardiopulmonary  process seen. Electronically Signed   By: Garald Balding M.D.   On: 04/13/2016 02:46

## 2016-05-04 NOTE — Progress Notes (Signed)
cc'ed to pcp °

## 2016-05-04 NOTE — Patient Instructions (Signed)
1. Upper endoscopy with Dr. Gala Romney. See separate instructions.  2. Please make sure you avoid spicy/greasy foods, do not eat and lay down within 3 hours, do not overeat, limit caffeine. See below.    Gastroesophageal Reflux Disease, Adult Normally, food travels down the esophagus and stays in the stomach to be digested. However, when a person has gastroesophageal reflux disease (GERD), food and stomach acid move back up into the esophagus. When this happens, the esophagus becomes sore and inflamed. Over time, GERD can create small holes (ulcers) in the lining of the esophagus.  CAUSES This condition is caused by a problem with the muscle between the esophagus and the stomach (lower esophageal sphincter, or LES). Normally, the LES muscle closes after food passes through the esophagus to the stomach. When the LES is weakened or abnormal, it does not close properly, and that allows food and stomach acid to go back up into the esophagus. The LES can be weakened by certain dietary substances, medicines, and medical conditions, including:  Tobacco use.  Pregnancy.  Having a hiatal hernia.  Heavy alcohol use.  Certain foods and beverages, such as coffee, chocolate, onions, and peppermint. RISK FACTORS This condition is more likely to develop in:  People who have an increased body weight.  People who have connective tissue disorders.  People who use NSAID medicines. SYMPTOMS Symptoms of this condition include:  Heartburn.  Difficult or painful swallowing.  The feeling of having a lump in the throat.  Abitter taste in the mouth.  Bad breath.  Having a large amount of saliva.  Having an upset or bloated stomach.  Belching.  Chest pain.  Shortness of breath or wheezing.  Ongoing (chronic) cough or a night-time cough.  Wearing away of tooth enamel.  Weight loss. Different conditions can cause chest pain. Make sure to see your health care provider if you experience chest  pain. DIAGNOSIS Your health care provider will take a medical history and perform a physical exam. To determine if you have mild or severe GERD, your health care provider may also monitor how you respond to treatment. You may also have other tests, including:  An endoscopy toexamine your stomach and esophagus with a small camera.  A test thatmeasures the acidity level in your esophagus.  A test thatmeasures how much pressure is on your esophagus.  A barium swallow or modified barium swallow to show the shape, size, and functioning of your esophagus. TREATMENT The goal of treatment is to help relieve your symptoms and to prevent complications. Treatment for this condition may vary depending on how severe your symptoms are. Your health care provider may recommend:  Changes to your diet.  Medicine.  Surgery. HOME CARE INSTRUCTIONS Diet  Follow a diet as recommended by your health care provider. This may involve avoiding foods and drinks such as:  Coffee and tea (with or without caffeine).  Drinks that containalcohol.  Energy drinks and sports drinks.  Carbonated drinks or sodas.  Chocolate and cocoa.  Peppermint and mint flavorings.  Garlic and onions.  Horseradish.  Spicy and acidic foods, including peppers, chili powder, curry powder, vinegar, hot sauces, and barbecue sauce.  Citrus fruit juices and citrus fruits, such as oranges, lemons, and limes.  Tomato-based foods, such as red sauce, chili, salsa, and pizza with red sauce.  Fried and fatty foods, such as donuts, french fries, potato chips, and high-fat dressings.  High-fat meats, such as hot dogs and fatty cuts of red and white meats, such as  rib eye steak, sausage, ham, and bacon.  High-fat dairy items, such as whole milk, butter, and cream cheese.  Eat small, frequent meals instead of large meals.  Avoid drinking large amounts of liquid with your meals.  Avoid eating meals during the 2-3 hours before  bedtime.  Avoid lying down right after you eat.  Do not exercise right after you eat. General Instructions  Pay attention to any changes in your symptoms.  Take over-the-counter and prescription medicines only as told by your health care provider. Do not take aspirin, ibuprofen, or other NSAIDs unless your health care provider told you to do so.  Do not use any tobacco products, including cigarettes, chewing tobacco, and e-cigarettes. If you need help quitting, ask your health care provider.  Wear loose-fitting clothing. Do not wear anything tight around your waist that causes pressure on your abdomen.  Raise (elevate) the head of your bed 6 inches (15cm).  Try to reduce your stress, such as with yoga or meditation. If you need help reducing stress, ask your health care provider.  If you are overweight, reduce your weight to an amount that is healthy for you. Ask your health care provider for guidance about a safe weight loss goal.  Keep all follow-up visits as told by your health care provider. This is important. SEEK MEDICAL CARE IF:  You have new symptoms.  You have unexplained weight loss.  You have difficulty swallowing, or it hurts to swallow.  You have wheezing or a persistent cough.  Your symptoms do not improve with treatment.  You have a hoarse voice. SEEK IMMEDIATE MEDICAL CARE IF:  You have pain in your arms, neck, jaw, teeth, or back.  You feel sweaty, dizzy, or light-headed.  You have chest pain or shortness of breath.  You vomit and your vomit looks like blood or coffee grounds.  You faint.  Your stool is bloody or black.  You cannot swallow, drink, or eat.   This information is not intended to replace advice given to you by your health care provider. Make sure you discuss any questions you have with your health care provider.   Document Released: 05/05/2005 Document Revised: 04/16/2015 Document Reviewed: 11/20/2014 Elsevier Interactive Patient  Education [employer].

## 2016-05-04 NOTE — Assessment & Plan Note (Signed)
50 year old female presenting with 3-4 week history of epigastric burning, burning quality radiating into the chest/throat/down the arms. Cardiology workup including stress test unremarkable. No significant improvement in symptoms with increase to Dexilant 30 mg twice a day. Denies NSAID or aspirin use. Recent Z-Pak for pharyngitis, no erythema of the pharynx noted today although she did have bilateral tonsilliths. She does have some mild solid food dysphagia. Previous EGD 4 years ago with possible cervical esophageal web. Discussed with patient at length. Differential diagnosis includes refractory GERD, esophagitis related to viral etiology although less likely given persistent symptoms, candida esophagitis. Need to rule out PUD. Recommend EGD plus or minus esophageal dilation in the near future with Dr. Gala Romney.  I have discussed the risks, alternatives, benefits with regards to but not limited to the risk of reaction to medication, bleeding, infection, perforation and the patient is agreeable to proceed. Written consent to be obtained.  Anti-reflex measures for now. Discussed, handout provided also. Further recommendations to follow.

## 2016-05-06 ENCOUNTER — Encounter (HOSPITAL_COMMUNITY): Payer: Self-pay | Admitting: *Deleted

## 2016-05-06 ENCOUNTER — Encounter (HOSPITAL_COMMUNITY)
Admission: RE | Disposition: A | Discharge status: Home / Self Care | Payer: Self-pay | Source: Ambulatory Visit | Attending: Internal Medicine

## 2016-05-06 ENCOUNTER — Ambulatory Visit (HOSPITAL_COMMUNITY)
Admission: RE | Admit: 2016-05-06 | Discharge: 2016-05-06 | Disposition: A | Discharge status: Home / Self Care | Payer: BC Managed Care – PPO | Source: Ambulatory Visit | Attending: Internal Medicine | Admitting: Internal Medicine

## 2016-05-06 DIAGNOSIS — R12 Heartburn: Secondary | ICD-10-CM | POA: Diagnosis not present

## 2016-05-06 DIAGNOSIS — Z8371 Family history of colonic polyps: Secondary | ICD-10-CM | POA: Insufficient documentation

## 2016-05-06 DIAGNOSIS — K449 Diaphragmatic hernia without obstruction or gangrene: Secondary | ICD-10-CM | POA: Insufficient documentation

## 2016-05-06 DIAGNOSIS — Z882 Allergy status to sulfonamides status: Secondary | ICD-10-CM | POA: Insufficient documentation

## 2016-05-06 DIAGNOSIS — Z833 Family history of diabetes mellitus: Secondary | ICD-10-CM | POA: Insufficient documentation

## 2016-05-06 DIAGNOSIS — Z800 Family history of malignant neoplasm of digestive organs: Secondary | ICD-10-CM | POA: Insufficient documentation

## 2016-05-06 DIAGNOSIS — F17210 Nicotine dependence, cigarettes, uncomplicated: Secondary | ICD-10-CM | POA: Diagnosis not present

## 2016-05-06 DIAGNOSIS — Z888 Allergy status to other drugs, medicaments and biological substances status: Secondary | ICD-10-CM | POA: Diagnosis not present

## 2016-05-06 DIAGNOSIS — Z79899 Other long term (current) drug therapy: Secondary | ICD-10-CM | POA: Diagnosis not present

## 2016-05-06 DIAGNOSIS — F419 Anxiety disorder, unspecified: Secondary | ICD-10-CM | POA: Diagnosis not present

## 2016-05-06 DIAGNOSIS — K219 Gastro-esophageal reflux disease without esophagitis: Secondary | ICD-10-CM | POA: Diagnosis not present

## 2016-05-06 DIAGNOSIS — Z8249 Family history of ischemic heart disease and other diseases of the circulatory system: Secondary | ICD-10-CM | POA: Insufficient documentation

## 2016-05-06 DIAGNOSIS — Z981 Arthrodesis status: Secondary | ICD-10-CM | POA: Insufficient documentation

## 2016-05-06 DIAGNOSIS — R1310 Dysphagia, unspecified: Secondary | ICD-10-CM | POA: Insufficient documentation

## 2016-05-06 DIAGNOSIS — R7303 Prediabetes: Secondary | ICD-10-CM | POA: Insufficient documentation

## 2016-05-06 DIAGNOSIS — R1013 Epigastric pain: Secondary | ICD-10-CM

## 2016-05-06 DIAGNOSIS — J45909 Unspecified asthma, uncomplicated: Secondary | ICD-10-CM | POA: Insufficient documentation

## 2016-05-06 HISTORY — PX: MALONEY DILATION: SHX5535

## 2016-05-06 HISTORY — PX: ESOPHAGOGASTRODUODENOSCOPY: SHX5428

## 2016-05-06 SURGERY — EGD (ESOPHAGOGASTRODUODENOSCOPY)
Anesthesia: Moderate Sedation

## 2016-05-06 MED ORDER — MEPERIDINE HCL 100 MG/ML IJ SOLN
INTRAMUSCULAR | Status: DC | PRN
  Administered 2016-05-06 (×2): 50 mg via INTRAVENOUS

## 2016-05-06 MED ORDER — LIDOCAINE VISCOUS 2 % MT SOLN
OROMUCOSAL | Status: DC | PRN
  Administered 2016-05-06: 4 mL via OROMUCOSAL

## 2016-05-06 MED ORDER — LIDOCAINE VISCOUS 2 % MT SOLN
OROMUCOSAL | Status: AC
  Filled 2016-05-06: qty 15

## 2016-05-06 MED ORDER — ONDANSETRON HCL 4 MG/2ML IJ SOLN
INTRAMUSCULAR | Status: DC | PRN
  Administered 2016-05-06: 4 mg via INTRAVENOUS

## 2016-05-06 MED ORDER — SODIUM CHLORIDE 0.9 % IV SOLN
INTRAVENOUS | Status: DC
  Administered 2016-05-06: 1000 mL via INTRAVENOUS

## 2016-05-06 MED ORDER — MIDAZOLAM HCL 5 MG/5ML IJ SOLN
INTRAMUSCULAR | Status: AC
  Filled 2016-05-06: qty 10

## 2016-05-06 MED ORDER — SIMETHICONE 40 MG/0.6ML PO SUSP
ORAL | Status: DC | PRN
  Administered 2016-05-06: 100 mL

## 2016-05-06 MED ORDER — ONDANSETRON HCL 4 MG/2ML IJ SOLN
INTRAMUSCULAR | Status: AC
  Filled 2016-05-06: qty 2

## 2016-05-06 MED ORDER — MIDAZOLAM HCL 5 MG/5ML IJ SOLN
INTRAMUSCULAR | Status: DC | PRN
  Administered 2016-05-06: 1 mg via INTRAVENOUS
  Administered 2016-05-06: 2 mg via INTRAVENOUS
  Administered 2016-05-06: 1 mg via INTRAVENOUS
  Administered 2016-05-06: 2 mg via INTRAVENOUS

## 2016-05-06 MED ORDER — MEPERIDINE HCL 100 MG/ML IJ SOLN
INTRAMUSCULAR | Status: AC
  Filled 2016-05-06: qty 2

## 2016-05-06 NOTE — Interval H&P Note (Signed)
History and Physical Interval Note:  05/06/2016 4:06 PM  Isabel Atkinson  has presented today for surgery, with the diagnosis of GERD, epigastric pain, dysphagia  The various methods of treatment have been discussed with the patient and family. After consideration of risks, benefits and other options for treatment, the patient has consented to  Procedure(s) with comments: ESOPHAGOGASTRODUODENOSCOPY (EGD) (N/A) - 3:00 pm - pt knows to arrive at 2:30 [name] (N/A) as a surgical intervention .  The patient's history has been reviewed, patient examined, no change in status, stable for surgery.  I have reviewed the patient's chart and labs.  Questions were answered to the patient's satisfaction.     Breland Elders  No change. EGD with possible ED per plan.  The risks, benefits, limitations, alternatives and imponderables have been reviewed with the patient. Potential for esophageal dilation, biopsy, etc. have also been reviewed.  Questions have been answered. All parties agreeable.

## 2016-05-06 NOTE — H&P (Signed)
t

## 2016-05-06 NOTE — H&P (View-Only) (Signed)
Primary Care Physician:  Sallee Lange, MD  Primary Gastroenterologist:  Garfield Cornea, MD   Chief Complaint  Patient presents with  . Gastroesophageal Reflux    HPI:  Isabel Atkinson is a 50 y.o. female here for further evaluation of suspected refractory GERD. Patient gives 3-4 week history of severe burning which starts in the epigastrium, goes up the chest and into her throat. She was also having burning in her arms. Nonexertional. Symptoms occur nocturnally, wake her up. She notices it at work. She works in [employer] at an [employer]. 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 She has been on Dexilant 30 mg daily, recently increased to twice a day when symptoms started. Actually tried 60 mg once daily but felt like 30 mg twice a day was more effective. She did not tolerate Carafate, developed tingling in her mouth. Currently using Gaviscon. She has cut back on her caffeine. Patient complains of difficulty swallowing bread, meat, Peanuts. In addition she has had some nocturnal loose bowel movement around 3 AM for short period time, this is now resolved. Back to once daily BM, soft to loose. No melena or rectal bleeding. She reports 3 pound weight loss with current illness.  She was seen in the emergency department September 5 with symptoms. She saw the cardiologist, stress testing which was unremarkable. Patient states she was put on medication (low-dose metoprolol) to slow her heart rate down.   She did receive Z-Pak approximately one week ago for possible pharyngitis. Strep test negative per patient. States that she had white spots in the back of her throat. On exam by PCP, she had erythematous pharynx with exudate.  No nsaids/asa. Ibuprofen in march. Kidney stones.       Current Outpatient Prescriptions  Medication Sig Dispense Refill  . ALPRAZolam (XANAX) 0.25 MG tablet TAKE 1/2 TO 1  TABLET BY MOUTH TWICE DAILY AS NEEDED FOR ANXIETY. 30 tablet 2  . Calcium-Vitamin D-Vitamin K (CALCIUM SOFT CHEWS) [id number] MG-UNT-MCG CHEW Chew 2 each by mouth daily.    . cycloSPORINE (RESTASIS) 0.05 % ophthalmic emulsion Place 1 drop into both eyes 2 (two) times daily. For dry eyes    . dexlansoprazole (DEXILANT) 60 MG capsule Take 1 capsule (60 mg total) by mouth daily. (Patient taking differently: Take 30 mg by mouth 2 (two) times daily. ) 30 capsule 4  . metoprolol succinate (TOPROL XL) 25 MG 24 hr tablet Take 0.5 tablets (12.5 mg total) by mouth daily. 45 tablet 3  . Multiple Vitamins-Minerals (MULTIVITAMIN WITH MINERALS) tablet Take 1 tablet by mouth daily.    . potassium chloride SA (K-DUR,KLOR-CON) 20 MEQ tablet Take 1 tablet (20 mEq total) by mouth 2 (two) times daily. 8 tablet 0  . UNABLE TO FIND GAVISON 2 TESP BID    . valACYclovir (VALTREX) 1000 MG tablet Take 2 tabs po BID for 1 day for cold sores 4 tablet 12   No current facility-administered medications for this visit.     Allergies as of 05/04/2016 - Review Complete 05/04/2016  Allergen Reaction Noted  . Carafate [sucralfate]  04/19/2016  . Celexa [citalopram hydrobromide]  02/27/2016  . Chantix [varenicline]  02/27/2016  . Sulfa antibiotics Itching 01/11/2012  . Wellbutrin [bupropion]  01/12/2016  . Fosamax [alendronate sodium] Nausea And Vomiting 11/09/2014    Past Medical History:  Diagnosis Date  . Anxiety   . Pre-diabetes   . Reactive  airway disease     Past Surgical History:  Procedure Laterality Date  . COLONOSCOPY  2013   RMR: Normal rectum. single diminutive sigmoid polyp noted above. Normal terminal ileum. Status post segmental biopsy. next TCS in 10 years.   . ESOPHAGOGASTRODUODENOSCOPY  2013   RMR: Possible cervical esophageal web-status post dilation as described above. Small hiatal hernia. Status post biopsy of normal appearing small bowel to screen for celiac disease.   Marland Kitchen NECK SURGERY  10/2012   . OVARIAN CYST SURGERY  1997   removed  . TUBAL LIGATION  2000    Family History  Problem Relation Age of Onset  . Colon cancer Maternal Grandfather 34  . Colon polyps Mother 43  . Colon cancer Maternal Uncle 40  . Hypertension Father   . Diabetes Father   . Heart disease Father   . Hyperlipidemia Father     Social History   Social History  . Marital status: Married    Spouse name: N/A  . Number of children: 2  . Years of education: N/A   Occupational History  . [employer]   Social History Main Topics  . Smoking status: Current Every Day Smoker    Packs/day: 1.00    Years: 25.00    Types: Cigarettes  . Smokeless tobacco: Never Used  . Alcohol use No  . Drug use: No  . Sexual activity: Yes    Birth control/ protection: Post-menopausal, Surgical   Other Topics Concern  . Not on file   Social History Narrative   2 children -21/13      ROS:  General: Negative for anorexia, weight loss, fever, chills, fatigue, weakness. Eyes: Negative for vision changes.  ENT: Negative for hoarseness, difficulty swallowing , nasal congestion. CV: Negative for chest pain, angina, palpitations, dyspnea on exertion, peripheral edema.  Respiratory: Negative for dyspnea at rest, dyspnea on exertion, cough, sputum, wheezing.  GI: See history of present illness. GU:  Negative for dysuria, hematuria, urinary incontinence, urinary frequency, nocturnal urination.  MS: Negative for joint pain, low back pain.  Derm: Negative for rash or itching.  Neuro: Negative for weakness, abnormal sensation, seizure, frequent headaches, memory loss, confusion.  Psych: Negative for anxiety, depression, suicidal ideation, hallucinations.  Endo: Negative for unusual weight change.  Heme: Negative for bruising or bleeding. Allergy: Negative for rash or hives.    Physical Examination:  BP 127/76   Pulse 71   Temp 97.6 F (36.4 C) (Oral)   Ht 5\' 2"  (1.575 m)   Wt 97 lb  6.4 oz (44.2 kg)   BMI 17.81 kg/m    General: Well-nourished, well-developed in no acute distress.  Head: Normocephalic, atraumatic.   Eyes: Conjunctiva pink, no icterus. Mouth: Oropharyngeal mucosa moist and pink , no lesions erythema or exudate. Bilateral tonsilloliths. Neck: Supple without thyromegaly, masses, or lymphadenopathy.  Lungs: Clear to auscultation bilaterally.  Heart: Regular rate and rhythm, no murmurs rubs or gallops.  Abdomen: Bowel sounds are normal, mild to moderate epigastric tenderness, nondistended, no hepatosplenomegaly or masses, no abdominal bruits or    hernia , no rebound or guarding.   Rectal: Not performed Extremities: No lower extremity edema. No clubbing or deformities.  Neuro: Alert and oriented x 4 , grossly normal neurologically.  Skin: Warm and dry, no rash or jaundice.   Psych: Alert and cooperative, normal mood and affect.  Labs: Lab Results  Component Value Date   CREATININE 0.43 (L) 04/13/2016   BUN 15 04/13/2016  NA 142 04/13/2016   K 3.0 (L) 04/13/2016   CL 106 04/13/2016   CO2 27 04/13/2016   Lab Results  Component Value Date   ALT 14 04/13/2016   AST 18 04/13/2016   ALKPHOS 58 04/13/2016   BILITOT 0.3 04/13/2016   Lab Results  Component Value Date   WBC 7.7 04/13/2016   HGB 12.4 04/13/2016   HCT 37.9 04/13/2016   MCV 90.5 04/13/2016   PLT 242 04/13/2016     Imaging Studies: Dg Chest Port 1 View  Result Date: 04/13/2016 CLINICAL DATA:  Acute onset of generalized chest pain. Initial encounter. EXAM: PORTABLE CHEST 1 VIEW COMPARISON:  Chest radiograph performed 01/14/2016 FINDINGS: The lungs are well-aerated and clear. There is no evidence of focal opacification, pleural effusion or pneumothorax. The cardiomediastinal silhouette is within normal limits. No acute osseous abnormalities are seen. Cervical spinal fusion hardware is noted. Scattered clips are seen overlying the left thyroid bed. IMPRESSION: No acute cardiopulmonary  process seen. Electronically Signed   By: Garald Balding M.D.   On: 04/13/2016 02:46

## 2016-05-06 NOTE — Op Note (Signed)
[facility] Patient Name: Isabel Atkinson Procedure Date: 05/06/2016 4:07 PM MRN: [id number] Date of Birth: [birth year] Attending MD: Norvel Richards , MD CSN: [id number] Age: 50 Admit Type: Outpatient Procedure:                Upper GI endoscopy Indications:              Dysphagia, Heartburn Providers:                Norvel Richards, MD, Rosina Lowenstein, RN, Sherlyn Lees, Technician Referring MD:              Medicines:                Midazolam 6 mg IV, Meperidine 100 mg IV,                            Ondansetron 4 mg IV Complications:            No immediate complications. Estimated Blood Loss:     Estimated blood loss was minimal. Procedure:                Pre-Anesthesia Assessment:                           - Prior to the procedure, a History and Physical                            was performed, and patient medications and                            allergies were reviewed. The patient's tolerance of                            previous anesthesia was also reviewed. The risks                            and benefits of the procedure and the sedation                            options and risks were discussed with the patient.                            All questions were answered, and informed consent                            was obtained. Prior Anticoagulants: The patient has                            taken no previous anticoagulant or antiplatelet                            agents. ASA Grade Assessment: II - A patient with  mild systemic disease. After reviewing the risks                            and benefits, the patient was deemed in                            satisfactory condition to undergo the procedure.                           After obtaining informed consent, the endoscope was                            passed under direct vision. Throughout the                            procedure, the patient's  blood pressure, pulse, and                            oxygen saturations were monitored continuously. The                            [id number]) scope was introduced through the                            mouth, and advanced to the second part of duodenum.                            The upper GI endoscopy was accomplished without                            difficulty. The patient tolerated the procedure                            well. Scope In: 4:21:01 PM Scope Out: 4:31:26 PM Total Procedure Duration: 0 hours 10 minutes 25 seconds  Findings:      The examined esophagus was normal.      A small hiatal hernia was present.      The exam was otherwise without abnormality. Scope was withdrawn and a 54       [place] dilator was passed to full insertion. A look back       revealed no apparent complication related to passage of the dilator.      The duodenal bulb and second portion of the duodenum were normal.       Estimated blood loss:minimal. Impression:               - Normal esophagus.                           - Small hiatal hernia.                           - The examination was otherwise normal.                           - Normal duodenal bulb and second  portion of the                            duodenum.                           - No specimens collected. Moderate Sedation:      Moderate (conscious) sedation was administered by the endoscopy nurse       and supervised by the endoscopist. The following parameters were       monitored: oxygen saturation, heart rate, blood pressure, respiratory       rate, EKG, adequacy of pulmonary ventilation, and response to care.       Total physician intraservice time was 23 minutes. Recommendation:           - Patient has a contact number available for                            emergencies. The signs and symptoms of potential                            delayed complications were discussed with the                             patient. Return to normal activities tomorrow.                            Written discharge instructions were provided to the                            patient.                           - Advance diet as tolerated.                           - Continue present medications.                           - Advance diet as tolerated.                           - Continue present medications but stop Dexilant;                            Begin omeprazole 40 mg daily. Office visit with [place]                            in one month. Use Chlorasepray for any transient                            sore you may expe.                           - Await pathology results. Procedure Code(s):        --- Professional ---  [id number], Esophagogastroduodenoscopy, flexible,                            transoral; diagnostic, including collection of                            specimen(s) by brushing or washing, when performed                            (separate procedure)                           99152, Moderate sedation services provided by the                            same physician or other qualified health care                            professional performing the diagnostic or                            therapeutic service that the sedation supports,                            requiring the presence of an independent trained                            observer to assist in the monitoring of the                            patient's level of consciousness and physiological                            status; initial 15 minutes of intraservice time,                            patient age 66 years or older                           [phone], Moderate sedation services; each additional                            15 minutes intraservice time Diagnosis Code(s):        --- Professional ---                           K44.9, Diaphragmatic hernia without obstruction or                             gangrene                           R13.10, Dysphagia, unspecified                           R12, Heartburn CPT copyright 2016 [employer]. All rights  reserved. The codes documented in this report are preliminary and upon coder review may  be revised to meet current compliance requirements. Cristopher Estimable. Kijana Estock, MD Norvel Richards, MD 05/06/2016 4:53:08 PM This report has been signed electronically. Number of Addenda: 0

## 2016-05-06 NOTE — Discharge Instructions (Addendum)
EGD Discharge instructions Please read the instructions outlined below and refer to this sheet in the next few weeks. These discharge instructions provide you with general information on caring for yourself after you leave the hospital. Your doctor may also give you specific instructions. While your treatment has been planned according to the most current medical practices available, unavoidable complications occasionally occur. If you have any problems or questions after discharge, please call your doctor. ACTIVITY  You may resume your regular activity but move at a slower pace for the next 24 hours.   Take frequent rest periods for the next 24 hours.   Walking will help expel (get rid of) the air and reduce the bloated feeling in your abdomen.   No driving for 24 hours (because of the anesthesia (medicine) used during the test).   You may shower.   Do not sign any important legal documents or operate any machinery for 24 hours (because of the anesthesia used during the test).  NUTRITION  Drink plenty of fluids.   You may resume your normal diet.   Begin with a light meal and progress to your normal diet.   Avoid alcoholic beverages for 24 hours or as instructed by your caregiver.  MEDICATIONS  You may resume your normal medications unless your caregiver tells you otherwise.  WHAT YOU CAN EXPECT TODAY  You may experience abdominal discomfort such as a feeling of fullness or gas pains.  FOLLOW-UP  Your doctor will discuss the results of your test with you.  SEEK IMMEDIATE MEDICAL ATTENTION IF ANY OF THE FOLLOWING OCCUR:  Excessive nausea (feeling sick to your stomach) and/or vomiting.   Severe abdominal pain and distention (swelling).   Trouble swallowing.   Temperature over 101 F (37.8 C).   Rectal bleeding or vomiting of blood.     GERD information provided  Stop Dexilant; begin omeprazole 40 mg daily  Use Chloraseptic Spray for any transient sore throat you  may experience from having your esophagus stretched today  Office visit with [place] in one month  swallowing precautions reviewed with family    Gastroesophageal Reflux Disease, Adult Normally, food travels down the esophagus and stays in the stomach to be digested. However, when a person has gastroesophageal reflux disease (GERD), food and stomach acid move back up into the esophagus. When this happens, the esophagus becomes sore and inflamed. Over time, GERD can create small holes (ulcers) in the lining of the esophagus.  CAUSES This condition is caused by a problem with the muscle between the esophagus and the stomach (lower esophageal sphincter, or LES). Normally, the LES muscle closes after food passes through the esophagus to the stomach. When the LES is weakened or abnormal, it does not close properly, and that allows food and stomach acid to go back up into the esophagus. The LES can be weakened by certain dietary substances, medicines, and medical conditions, including:  Tobacco use.  Pregnancy.  Having a hiatal hernia.  Heavy alcohol use.  Certain foods and beverages, such as coffee, chocolate, onions, and peppermint. RISK FACTORS This condition is more likely to develop in:  People who have an increased body weight.  People who have connective tissue disorders.  People who use NSAID medicines. SYMPTOMS Symptoms of this condition include:  Heartburn.  Difficult or painful swallowing.  The feeling of having a lump in the throat.  Abitter taste in the mouth.  Bad breath.  Having a large amount of saliva.  Having an upset or bloated  stomach.  Belching.  Chest pain.  Shortness of breath or wheezing.  Ongoing (chronic) cough or a night-time cough.  Wearing away of tooth enamel.  Weight loss. Different conditions can cause chest pain. Make sure to see your health care provider if you experience chest pain. DIAGNOSIS Your health care provider will take a  medical history and perform a physical exam. To determine if you have mild or severe GERD, your health care provider may also monitor how you respond to treatment. You may also have other tests, including:  An endoscopy toexamine your stomach and esophagus with a small camera.  A test thatmeasures the acidity level in your esophagus.  A test thatmeasures how much pressure is on your esophagus.  A barium swallow or modified barium swallow to show the shape, size, and functioning of your esophagus. TREATMENT The goal of treatment is to help relieve your symptoms and to prevent complications. Treatment for this condition may vary depending on how severe your symptoms are. Your health care provider may recommend:  Changes to your diet.  Medicine.  Surgery. HOME CARE INSTRUCTIONS Diet  Follow a diet as recommended by your health care provider. This may involve avoiding foods and drinks such as:  Coffee and tea (with or without caffeine).  Drinks that containalcohol.  Energy drinks and sports drinks.  Carbonated drinks or sodas.  Chocolate and cocoa.  Peppermint and mint flavorings.  Garlic and onions.  Horseradish.  Spicy and acidic foods, including peppers, chili powder, curry powder, vinegar, hot sauces, and barbecue sauce.  Citrus fruit juices and citrus fruits, such as oranges, lemons, and limes.  Tomato-based foods, such as red sauce, chili, salsa, and pizza with red sauce.  Fried and fatty foods, such as donuts, french fries, potato chips, and high-fat dressings.  High-fat meats, such as hot dogs and fatty cuts of red and white meats, such as rib eye steak, sausage, ham, and bacon.  High-fat dairy items, such as whole milk, butter, and cream cheese.  Eat small, frequent meals instead of large meals.  Avoid drinking large amounts of liquid with your meals.  Avoid eating meals during the 2-3 hours before bedtime.  Avoid lying down right after you eat.  Do  not exercise right after you eat. General Instructions  Pay attention to any changes in your symptoms.  Take over-the-counter and prescription medicines only as told by your health care provider. Do not take aspirin, ibuprofen, or other NSAIDs unless your health care provider told you to do so.  Do not use any tobacco products, including cigarettes, chewing tobacco, and e-cigarettes. If you need help quitting, ask your health care provider.  Wear loose-fitting clothing. Do not wear anything tight around your waist that causes pressure on your abdomen.  Raise (elevate) the head of your bed 6 inches (15cm).  Try to reduce your stress, such as with yoga or meditation. If you need help reducing stress, ask your health care provider.  If you are overweight, reduce your weight to an amount that is healthy for you. Ask your health care provider for guidance about a safe weight loss goal.  Keep all follow-up visits as told by your health care provider. This is important. SEEK MEDICAL CARE IF:  You have new symptoms.  You have unexplained weight loss.  You have difficulty swallowing, or it hurts to swallow.  You have wheezing or a persistent cough.  Your symptoms do not improve with treatment.  You have a hoarse voice. SEEK IMMEDIATE  MEDICAL CARE IF:  You have pain in your arms, neck, jaw, teeth, or back.  You feel sweaty, dizzy, or light-headed.  You have chest pain or shortness of breath.  You vomit and your vomit looks like blood or coffee grounds.  You faint.  Your stool is bloody or black.  You cannot swallow, drink, or eat.   This information is not intended to replace advice given to you by your health care provider. Make sure you discuss any questions you have with your health care provider.   Document Released: 05/05/2005 Document Revised: 04/16/2015 Document Reviewed: 11/20/2014 Elsevier Interactive Patient Education [employer].

## 2016-05-12 ENCOUNTER — Encounter (HOSPITAL_COMMUNITY): Payer: Self-pay | Admitting: Internal Medicine

## 2016-05-12 ENCOUNTER — Telehealth: Payer: Self-pay | Admitting: Family Medicine

## 2016-05-12 DIAGNOSIS — J441 Chronic obstructive pulmonary disease with (acute) exacerbation: Secondary | ICD-10-CM

## 2016-05-12 DIAGNOSIS — R0600 Dyspnea, unspecified: Secondary | ICD-10-CM

## 2016-05-12 NOTE — Telephone Encounter (Signed)
PFT pre and post rea dyspnea and COPD

## 2016-05-12 NOTE — Telephone Encounter (Signed)
I don't see where this was ordered or in the notes. Do you want to order this test.

## 2016-05-12 NOTE — Telephone Encounter (Signed)
Pt has not heard about the scheduling of the PFT & she's supposed to see Dr. Nicki Reaper after the test  Pt prefers late October for test due to having so much done lately    (I didn't see anything in the chart)  Please advise

## 2016-05-13 NOTE — Telephone Encounter (Signed)
Left message return call 05/13/2016 (PFT ordered in [place])

## 2016-05-13 NOTE — Telephone Encounter (Signed)
Spoke with patient and informed her per Dr.Scott Luking- order for PFT was put into the system. Hospital will contact patient to schedule. Patient verbalized understanding.

## 2016-05-14 ENCOUNTER — Telehealth: Payer: Self-pay | Admitting: *Deleted

## 2016-05-14 ENCOUNTER — Other Ambulatory Visit: Payer: Self-pay | Admitting: *Deleted

## 2016-05-14 ENCOUNTER — Other Ambulatory Visit (HOSPITAL_COMMUNITY): Payer: Self-pay | Admitting: Respiratory Therapy

## 2016-05-14 DIAGNOSIS — R0600 Dyspnea, unspecified: Secondary | ICD-10-CM

## 2016-05-14 NOTE — Telephone Encounter (Signed)
Pt.notified

## 2016-05-14 NOTE — Telephone Encounter (Signed)
PFT scheduled at [facility] for oct 13th at 9:30. No smoking, caffeine or inhalers 4 hours prior to test. If pt needs to change appt the number is [phone].

## 2016-05-18 ENCOUNTER — Ambulatory Visit: Payer: BC Managed Care – PPO | Admitting: Gastroenterology

## 2016-05-19 ENCOUNTER — Encounter: Payer: Self-pay | Admitting: Internal Medicine

## 2016-05-19 ENCOUNTER — Other Ambulatory Visit (HOSPITAL_COMMUNITY): Payer: Self-pay | Admitting: Respiratory Therapy

## 2016-05-20 ENCOUNTER — Encounter: Payer: Self-pay | Admitting: Family Medicine

## 2016-05-20 ENCOUNTER — Ambulatory Visit (INDEPENDENT_AMBULATORY_CARE_PROVIDER_SITE_OTHER): Payer: BC Managed Care – PPO | Admitting: Family Medicine

## 2016-05-20 ENCOUNTER — Other Ambulatory Visit: Payer: Self-pay

## 2016-05-20 VITALS — BP 108/72 | Ht 62.0 in | Wt 98.4 lb

## 2016-05-20 DIAGNOSIS — R202 Paresthesia of skin: Principal | ICD-10-CM

## 2016-05-20 DIAGNOSIS — M4802 Spinal stenosis, cervical region: Secondary | ICD-10-CM

## 2016-05-20 DIAGNOSIS — R200 Anesthesia of skin: Secondary | ICD-10-CM

## 2016-05-20 MED ORDER — DULOXETINE HCL 30 MG PO CPEP: 30.0000 mg | ORAL_CAPSULE | Freq: Every day | ORAL | 3 refills | Status: DC

## 2016-05-20 NOTE — Progress Notes (Signed)
   Subjective:    Patient ID: Isabel Atkinson, female    DOB: 05-10-1966, 50 y.o.   MRN: [id number]  HPI  Patient in today for burning and itching sensation to bilateral hands and arms.  Patient relates uncomfortable burning sensation in both arms along with feeling of weakness she can no longer pick up boxes that she used to she states at times her arms give way and she'll drop things at times she does wake up at night with her hands being very numb otherwise it just a constant burning in the discomfort and weakness into the arms. She does have a history of cervical disc disease and she is a previous surgery in [place]. States no other concerns this visit.   Review of Systems These symptoms have been going on for a few months worse over the past 30 days.    Objective:   Physical Exam Patient was subjected discomfort around her neck strength in arms 4/5. Reflexes hyperreflexia in both the arms forearms and the knees. No clonusspasticity. Some weakness in the hand muscles bilateral. 4/5       Assessment & Plan:  We will try Cymbalta to see if this helps 30 mg daily I believe the patient is suffering with spinal stenosis. I believe she is having significant pressure on the spinal cord which is causing hyperreflexia. She will need to have MRI. We will discuss with specialist regarding your MRI is interfered with by her previous surgery.  I did discuss the case with radiology based on her symptomatology a plain MRI the cervical spine is indicated. No contrast necessary.

## 2016-05-21 ENCOUNTER — Other Ambulatory Visit: Payer: Self-pay

## 2016-05-21 ENCOUNTER — Encounter (HOSPITAL_COMMUNITY): Payer: BC Managed Care – PPO

## 2016-05-21 DIAGNOSIS — M4802 Spinal stenosis, cervical region: Secondary | ICD-10-CM

## 2016-05-21 NOTE — Progress Notes (Signed)
MRI scheduled for 05/28/16 at 8:45 am at [facility]. Patient is aware.

## 2016-05-25 ENCOUNTER — Other Ambulatory Visit (HOSPITAL_COMMUNITY): Payer: Self-pay | Admitting: Respiratory Therapy

## 2016-05-26 ENCOUNTER — Other Ambulatory Visit: Payer: Self-pay | Admitting: Nurse Practitioner

## 2016-05-26 MED ORDER — CEFPROZIL 500 MG PO TABS: 500.0000 mg | ORAL_TABLET | Freq: Two times a day (BID) | ORAL | 0 refills | Status: DC

## 2016-05-26 NOTE — Progress Notes (Signed)
Patient in today with her son. Having c/o cough worse at night for the past 4-5 days. No fever, headache, sore throat or wheezing. Producing green sputum at times. Last seen for sinuses 04/09/16.  Rt TM normal; Lt TM clear effusion. Pharynx mildly injected with green PND noted. Neck supple with mild anterior adenopathy. Lungs clear. Heart RRR.   rhinosinusitis  Meds ordered this encounter  Medications  . cefPROZIL (CEFZIL) 500 MG tablet    Sig: Take 1 tablet (500 mg total) by mouth 2 (two) times daily.    Dispense:  20 tablet    Refill:  0    Order Specific Question:   Supervising Provider    Answer:   Mikey Kirschner [[id number]]   Call back if worsens or persists.

## 2016-05-28 ENCOUNTER — Ambulatory Visit (HOSPITAL_COMMUNITY)
Admission: RE | Admit: 2016-05-28 | Discharge: 2016-05-28 | Disposition: A | Discharge status: Home / Self Care | Payer: BC Managed Care – PPO | Source: Ambulatory Visit | Attending: Family Medicine | Admitting: Family Medicine

## 2016-05-28 DIAGNOSIS — M4802 Spinal stenosis, cervical region: Secondary | ICD-10-CM | POA: Insufficient documentation

## 2016-05-28 DIAGNOSIS — M50221 Other cervical disc displacement at C4-C5 level: Secondary | ICD-10-CM | POA: Insufficient documentation

## 2016-06-01 ENCOUNTER — Ambulatory Visit (INDEPENDENT_AMBULATORY_CARE_PROVIDER_SITE_OTHER): Payer: BC Managed Care – PPO | Admitting: Internal Medicine

## 2016-06-01 ENCOUNTER — Encounter: Payer: Self-pay | Admitting: Internal Medicine

## 2016-06-01 VITALS — BP 120/77 | HR 77 | Temp 99.0°F | Ht 62.0 in | Wt 96.8 lb

## 2016-06-01 DIAGNOSIS — K219 Gastro-esophageal reflux disease without esophagitis: Secondary | ICD-10-CM | POA: Diagnosis not present

## 2016-06-01 DIAGNOSIS — R1310 Dysphagia, unspecified: Secondary | ICD-10-CM

## 2016-06-01 DIAGNOSIS — K449 Diaphragmatic hernia without obstruction or gangrene: Secondary | ICD-10-CM | POA: Diagnosis not present

## 2016-06-01 NOTE — Progress Notes (Signed)
Primary Care Physician:  Sallee Lange, MD Primary Gastroenterologist:  Dr. Gala Romney  Pre-Procedure History & Physical: HPI:  Isabel Atkinson is a 50 y.o. female here for follow-up of GERD/dysphagia. Recent EGD demonstrated a small hiatal hernia no structural esophageal lesion. Maloney dilator passed empirically. Patient states dysphagia has resolved. She was switched to omeprazole 40 mg daily with excellent control of her reflux symptoms. She is very happy. Having paresthesias of both upper extremities. She tells me MRI was negative. She is being referred to a neurologist.  She questions about the hiatal hernia. She has a very small one. Likely not causing much in way of any symptoms all could be although could be a minor contributing factor to GERD.    Past Medical History:  Diagnosis Date  . Anxiety   . Pre-diabetes   . Reactive airway disease     Past Surgical History:  Procedure Laterality Date  . COLONOSCOPY  2013   RMR: Normal rectum. single diminutive sigmoid polyp noted above. Normal terminal ileum. Status post segmental biopsy. next TCS in 10 years.   . ESOPHAGOGASTRODUODENOSCOPY  2013   RMR: Possible cervical esophageal web-status post dilation as described above. Small hiatal hernia. Status post biopsy of normal appearing small bowel to screen for celiac disease.   . ESOPHAGOGASTRODUODENOSCOPY N/A 05/06/2016   Procedure: ESOPHAGOGASTRODUODENOSCOPY (EGD);  Surgeon: Daneil Dolin, MD;  Location: [facility];  Service: Endoscopy;  Laterality: N/A;  3:00 pm - pt knows to arrive at 2:30  . MALONEY DILATION N/A 05/06/2016   Procedure: Venia Minks DILATION;  Surgeon: Daneil Dolin, MD;  Location: [facility];  Service: Endoscopy;  Laterality: N/A;  . NECK SURGERY  10/2012  . OVARIAN CYST SURGERY  1997   removed  . TUBAL LIGATION  2000    Prior to Admission medications   Medication Sig Start Date End Date Taking? Authorizing Provider  ALPRAZolam (XANAX) 0.25 MG tablet TAKE  1/2 TO 1 TABLET BY MOUTH TWICE DAILY AS NEEDED FOR ANXIETY. 02/27/16  Yes Kathyrn Drown, MD  Calcium-Vitamin D-Vitamin K (CALCIUM SOFT CHEWS) 500-500-40 MG-UNT-MCG CHEW Chew 2 each by mouth daily.   Yes Historical Provider, MD  cefPROZIL (CEFZIL) 500 MG tablet Take 1 tablet (500 mg total) by mouth 2 (two) times daily. 05/26/16  Yes Nilda Simmer, NP  cycloSPORINE (RESTASIS) 0.05 % ophthalmic emulsion Place 1 drop into both eyes 2 (two) times daily. For dry eyes   Yes Historical Provider, MD  metoprolol succinate (TOPROL XL) 25 MG 24 hr tablet Take 0.5 tablets (12.5 mg total) by mouth daily. 04/21/16  Yes Imogene Burn, PA-C  Multiple Vitamins-Minerals (MULTIVITAMIN WITH MINERALS) tablet Take 1 tablet by mouth daily.   Yes Historical Provider, MD  DULoxetine (CYMBALTA) 30 MG capsule Take 1 capsule (30 mg total) by mouth daily. Patient not taking: Reported on 06/01/2016 05/20/16   Kathyrn Drown, MD  pantoprazole (PROTONIX) 20 MG tablet  04/13/16   Historical Provider, MD  potassium chloride SA (K-DUR,KLOR-CON) 20 MEQ tablet Take 1 tablet (20 mEq total) by mouth 2 (two) times daily. Patient not taking: Reported on 06/01/2016 04/13/16   Rolland Porter, MD  UNABLE TO FIND GAVISON 2 TESP BID    Historical Provider, MD  valACYclovir (VALTREX) 1000 MG tablet Take 2 tabs po BID for 1 day for cold sores Patient not taking: Reported on 06/01/2016 08/14/14   Kathyrn Drown, MD    Allergies as of 06/01/2016 - Review Complete 06/01/2016  Allergen Reaction Noted  . Carafate [sucralfate]  04/19/2016  . Celexa [citalopram hydrobromide]  02/27/2016  . Chantix [varenicline]  02/27/2016  . Sulfa antibiotics Itching 01/11/2012  . Wellbutrin [bupropion]  01/12/2016  . Fosamax [alendronate sodium] Nausea And Vomiting 11/09/2014    Family History  Problem Relation Age of Onset  . Colon cancer Maternal Grandfather 62  . Colon polyps Mother 29  . Colon cancer Maternal Uncle 53  . Hypertension Father   . Diabetes  Father   . Heart disease Father   . Hyperlipidemia Father     Social History   Social History  . Marital status: Married    Spouse name: N/A  . Number of children: 2  . Years of education: N/A   Occupational History  . [employer]   Social History Main Topics  . Smoking status: Current Every Day Smoker    Packs/day: 1.00    Years: 25.00    Types: Cigarettes  . Smokeless tobacco: Never Used  . Alcohol use No  . Drug use: No  . Sexual activity: Yes    Birth control/ protection: Post-menopausal, Surgical   Other Topics Concern  . Not on file   Social History Narrative   2 children -21/13    Review of Systems: See HPI, otherwise negative ROS  Physical Exam: BP 120/77   Pulse 77   Temp 99 F (37.2 C) (Oral)   Ht 5\' 2"  (1.575 m)   Wt 96 lb 12.8 oz (43.9 kg)   BMI 17.70 kg/m  General:   Alert,  Well-developed, well-nourished, pleasant and cooperative in NAD  Impression:  Pleasant 50 year old lady with GERD now well-controlled on omeprazole 40 mg daily. Sager resolved status post dilation of her esophagus empirically with a Maloney dilator. No further intervention for her hiatal hernia recommended other than treating her GERD  Recommendations:  Continue omeprazole 40 mg daily  GERD Information provided  Office visit in 1 year     Notice: This dictation was prepared with Dragon dictation along with smaller phrase technology. Any transcriptional errors that result from this process are unintentional and may not be corrected upon review.

## 2016-06-01 NOTE — Patient Instructions (Signed)
Continue omeprazole 40 mg daily  GERD Information provided  Office visit in 1 year

## 2016-06-04 ENCOUNTER — Ambulatory Visit (HOSPITAL_COMMUNITY)
Admission: RE | Admit: 2016-06-04 | Discharge: 2016-06-04 | Disposition: A | Discharge status: Home / Self Care | Payer: BC Managed Care – PPO | Source: Ambulatory Visit | Attending: Family Medicine | Admitting: Family Medicine

## 2016-06-04 DIAGNOSIS — R0600 Dyspnea, unspecified: Secondary | ICD-10-CM | POA: Insufficient documentation

## 2016-06-04 DIAGNOSIS — J449 Chronic obstructive pulmonary disease, unspecified: Secondary | ICD-10-CM | POA: Diagnosis not present

## 2016-06-04 MED ORDER — ALBUTEROL SULFATE (2.5 MG/3ML) 0.083% IN NEBU
2.5000 mg | INHALATION_SOLUTION | Freq: Once | RESPIRATORY_TRACT | Status: AC
  Administered 2016-06-04: 2.5 mg via RESPIRATORY_TRACT

## 2016-06-07 LAB — PULMONARY FUNCTION TEST
FEF 25-75 Post: 1.3 L/sec
FEF 25-75 Pre: 1.22 L/sec
FEF2575-%Change-Post: 6 %
FEF2575-%Pred-Post: 50 %
FEF2575-%Pred-Pre: 47 %
FEV1-%CHANGE-POST: 3 %
FEV1-%PRED-PRE: 71 %
FEV1-%Pred-Post: 73 %
FEV1-PRE: 1.83 L
FEV1-Post: 1.89 L
FEV1FVC-%Change-Post: 5 %
FEV1FVC-%Pred-Pre: 89 %
FEV6-%Change-Post: -1 %
FEV6-%PRED-PRE: 80 %
FEV6-%Pred-Post: 79 %
FEV6-POST: 2.5 L
FEV6-Pre: 2.55 L
FEV6FVC-%Change-Post: 0 %
FEV6FVC-%PRED-POST: 103 %
FEV6FVC-%PRED-PRE: 102 %
FVC-%CHANGE-POST: -2 %
FVC-%PRED-POST: 76 %
FVC-%PRED-PRE: 78 %
FVC-POST: 2.5 L
FVC-PRE: 2.56 L
POST FEV6/FVC RATIO: 100 %
PRE FEV6/FVC RATIO: 100 %
Post FEV1/FVC ratio: 75 %
Pre FEV1/FVC ratio: 71 %

## 2016-06-10 ENCOUNTER — Encounter: Payer: Self-pay | Admitting: Family Medicine

## 2016-06-10 DIAGNOSIS — J449 Chronic obstructive pulmonary disease, unspecified: Secondary | ICD-10-CM | POA: Insufficient documentation

## 2016-06-14 ENCOUNTER — Ambulatory Visit: Payer: BC Managed Care – PPO | Admitting: Internal Medicine

## 2016-06-18 ENCOUNTER — Ambulatory Visit (INDEPENDENT_AMBULATORY_CARE_PROVIDER_SITE_OTHER): Payer: BC Managed Care – PPO | Admitting: Nurse Practitioner

## 2016-06-18 ENCOUNTER — Encounter: Payer: Self-pay | Admitting: Nurse Practitioner

## 2016-06-18 VITALS — BP 100/62 | Temp 98.2°F | Ht 62.0 in | Wt 98.4 lb

## 2016-06-18 DIAGNOSIS — J329 Chronic sinusitis, unspecified: Secondary | ICD-10-CM | POA: Diagnosis not present

## 2016-06-18 DIAGNOSIS — J310 Chronic rhinitis: Secondary | ICD-10-CM

## 2016-06-18 MED ORDER — LEVOFLOXACIN 500 MG PO TABS
500.0000 mg | ORAL_TABLET | Freq: Every day | ORAL | 0 refills | Status: DC
Start: 2016-06-18 — End: 2016-07-05

## 2016-06-18 MED ORDER — AMITRIPTYLINE HCL 10 MG PO TABS: 10.0000 mg | ORAL_TABLET | Freq: Every day | ORAL | 2 refills | Status: DC

## 2016-06-19 ENCOUNTER — Encounter: Payer: Self-pay | Admitting: Nurse Practitioner

## 2016-06-19 NOTE — Progress Notes (Signed)
Subjective:  Presents for c/o congestion and cough for the past several days. No fever or sore throat. Frontal area headache. Runny nose. Coughing. No actual wheezing, hard to get of breath last night, better today. No shortness of breath. No chest pain. No ear pain. PMH: Patient has COPD. Seeing Dr. Fredna Dow soon for bilateral arm pain, burning and numbness that goes into the hands.   Objective:   BP 100/62   Temp 98.2 F (36.8 C) (Oral)   Ht 5\' 2"  (1.575 m)   Wt 98 lb 6.4 oz (44.6 kg)   BMI 18.00 kg/m  NAD. Alert, oriented. TMs clear effusion, no erythema. Pharynx injected with PND noted. Neck supple with mild soft anterior adenopathy. Lungs breath sounds mildly diminished but clear. Heart regular rate rhythm.  Assessment: Rhinosinusitis   Plan:  Meds ordered this encounter  Medications  . levofloxacin (LEVAQUIN) 500 MG tablet    Sig: Take 1 tablet (500 mg total) by mouth daily.    Dispense:  10 tablet    Refill:  0    Order Specific Question:   Supervising Provider    Answer:   Mikey Kirschner [[id number]]  . amitriptyline (ELAVIL) 10 MG tablet    Sig: Take 1 tablet (10 mg total) by mouth at bedtime.    Dispense:  30 tablet    Refill:  2    Order Specific Question:   Supervising Provider    Answer:   Mikey Kirschner [[id number]]   OTC meds as directed. Call back if worsens or persists. Discussed importance of smoking cessation. Trial of Amitriptyline to see if this will help neuropathic symptoms. Follow up with Dr. Fredna Dow as planned.

## 2016-06-21 ENCOUNTER — Encounter: Payer: Self-pay | Admitting: Family Medicine

## 2016-06-24 ENCOUNTER — Ambulatory Visit (INDEPENDENT_AMBULATORY_CARE_PROVIDER_SITE_OTHER): Payer: BC Managed Care – PPO | Admitting: Neurology

## 2016-06-24 ENCOUNTER — Encounter: Payer: Self-pay | Admitting: Neurology

## 2016-06-24 VITALS — BP 108/76 | HR 78 | Ht 62.0 in | Wt 96.8 lb

## 2016-06-24 DIAGNOSIS — R208 Other disturbances of skin sensation: Secondary | ICD-10-CM | POA: Diagnosis not present

## 2016-06-24 MED ORDER — GABAPENTIN 100 MG PO CAPS: 100.0000 mg | ORAL_CAPSULE | Freq: Three times a day (TID) | ORAL | 3 refills | Status: DC

## 2016-06-24 NOTE — Patient Instructions (Signed)
   Neurontin (gabapentin) may result in drowsiness, ankle swelling, gait instability, or possibly dizziness. Please contact our office if significant side effects occur with this medication.  

## 2016-06-24 NOTE — Progress Notes (Signed)
Reason for visit: Dysesthesias  Referring physician: Dr. Noe Gens [name] is a 50 y.o. female  History of present illness:  [name] is a 51 year old right-handed white female with a history of prior cervical spine surgery at the C5-6 and C6-7 levels. The patient developed burning sensations in both hands in September 2017. The onset was spontaneous in nature, unrelated to an injury. The patient has an itching sensation in the arms and across the top of the chest as well. She may have some numbness in the tips of the fingers. The patient denies any definite weakness of the arms. She may have some slight numbness in the toes and in the feet at times. She denies any balance changes were problems with neck pain. She has not had any changes in bowel or bladder control. She has been able to sleep with the symptoms. The patient was given a trial on Cymbalta but could not tolerate it. Ten mg dosing of amitriptyline results in a hangover feeling the next morning. The patient leaves that she is slightly better with her symptoms since onset in September. She is sent to this office for an evaluation. A repeat MRI of the cervical spine was done, no clear evidence of spinal cord or nerve root impingement was seen.  Past Medical History:  Diagnosis Date  . Anxiety   . Pre-diabetes   . Reactive airway disease   . Tachycardia     Past Surgical History:  Procedure Laterality Date  . COLONOSCOPY  2013   RMR: Normal rectum. single diminutive sigmoid polyp noted above. Normal terminal ileum. Status post segmental biopsy. next TCS in 10 years.   . ESOPHAGOGASTRODUODENOSCOPY  2013   RMR: Possible cervical esophageal web-status post dilation as described above. Small hiatal hernia. Status post biopsy of normal appearing small bowel to screen for celiac disease.   . ESOPHAGOGASTRODUODENOSCOPY N/A 05/06/2016   Procedure: ESOPHAGOGASTRODUODENOSCOPY (EGD);  Surgeon: Daneil Dolin, MD;  [facility]  [facility];  Service: Endoscopy;  Laterality: N/A;  3:00 pm - pt knows to arrive at 2:30  . MALONEY DILATION N/A 05/06/2016   Procedure: Venia Minks DILATION;  Surgeon: Daneil Dolin, MD;  [facility]: [facility];  Service: Endoscopy;  Laterality: N/A;  . NECK SURGERY  10/2012  . OVARIAN CYST SURGERY  1997   removed  . TUBAL LIGATION  2000    Family History  Problem Relation Age of Onset  . Colon cancer Maternal Grandfather 36  . Colon polyps Mother 32  . Colon cancer Maternal Uncle 8  . Hypertension Father   . Diabetes Father   . Heart disease Father   . Hyperlipidemia Father     Social history:  reports that she has been smoking Cigarettes.  She has a 25.00 pack-year smoking history. She has never used smokeless tobacco. She reports that she does not drink alcohol or use drugs.  Medications:  Prior to Admission medications   Medication Sig Start Date End Date Taking? Authorizing Provider  ALPRAZolam (XANAX) 0.25 MG tablet TAKE 1/2 TO 1 TABLET BY MOUTH TWICE DAILY AS NEEDED FOR ANXIETY. 02/27/16  Yes Kathyrn Drown, MD  amitriptyline (ELAVIL) 10 MG tablet Take 1 tablet (10 mg total) by mouth at bedtime. 06/18/16  Yes Nilda Simmer, NP  Calcium-Vitamin D-Vitamin K (CALCIUM SOFT CHEWS) [id number] MG-UNT-MCG CHEW Chew 2 each by mouth daily.   Yes Historical Provider, MD  cycloSPORINE (RESTASIS) 0.05 % ophthalmic emulsion Place 1 drop into both  eyes 2 (two) times daily. For dry eyes   Yes Historical Provider, MD  levofloxacin (LEVAQUIN) 500 MG tablet Take 1 tablet (500 mg total) by mouth daily. 06/18/16  Yes Nilda Simmer, NP  metoprolol succinate (TOPROL XL) 25 MG 24 hr tablet Take 0.5 tablets (12.5 mg total) by mouth daily. 04/21/16  Yes Imogene Burn, PA-C  Multiple Vitamins-Minerals (MULTIVITAMIN WITH MINERALS) tablet Take 1 tablet by mouth daily.   Yes Historical Provider, MD  omeprazole (PRILOSEC) 40 MG capsule  06/01/16  Yes Historical Provider, MD      Allergies    Allergen Reactions  . Carafate [Sucralfate]     Mouth tingling  . Celexa [Citalopram Hydrobromide]     Side effects  . Chantix [Varenicline]     Side effects  . Sulfa Antibiotics Itching  . Wellbutrin [Bupropion]     Severe weight loss and loss of appetite  . Fosamax [Alendronate Sodium] Nausea And Vomiting    GERD can not tolerate    ROS:  Out of a complete 14 system review of symptoms, the patient complains only of the following symptoms, and all other reviewed systems are negative.  Numbness Decreased energy Itching  Blood pressure 108/76, pulse 78, height 5\' 2"  (1.575 m), weight 96 lb 12 oz (43.9 kg).  Physical Exam  General: The patient is alert and cooperative at the time of the examination.  Eyes: Pupils are equal, round, and reactive to light. Discs are flat bilaterally.  Neck: The neck is supple, no carotid bruits are noted.  Respiratory: The respiratory examination is clear.  Cardiovascular: The cardiovascular examination reveals a regular rate and rhythm, no obvious murmurs or rubs are noted.  Skin: Extremities are without significant edema.  Neurologic Exam  Mental status: The patient is alert and oriented x 3 at the time of the examination. The patient has apparent normal recent and remote memory, with an apparently normal attention span and concentration ability.  Cranial nerves: Facial symmetry is present. There is good sensation of the face to pinprick and soft touch bilaterally. The strength of the facial muscles and the muscles to head turning and shoulder shrug are normal bilaterally. Speech is well enunciated, no aphasia or dysarthria is noted. Extraocular movements are full. Visual fields are full. The tongue is midline, and the patient has symmetric elevation of the soft palate. No obvious hearing deficits are noted. The patient has frequent eye closure, blinking movements.  Motor: The motor testing reveals 5 over 5 strength of all 4 extremities.  Good symmetric motor tone is noted throughout.  Sensory: Sensory testing is intact to pinprick, soft touch, vibration sensation, and position sense on all 4 extremities. No evidence of extinction is noted.  Coordination: Cerebellar testing reveals good finger-nose-finger and heel-to-shin bilaterally.  Gait and station: Gait is normal. Tandem gait is normal. Romberg is negative. No drift is seen.  Reflexes: Deep tendon reflexes are symmetric and normal bilaterally. Toes are downgoing bilaterally.   Assessment/Plan:  1. Upper extremity dysesthesias, possible neurodermatitis  The clinical examination today is unremarkable. The patient has had unusual sensations of burning and itching in the arms and upper chest. There is no evidence of cervical pathology by MRI to explain the symptoms. The patient may have a neurodermatitis. She will be placed on gabapentin, blood work will be done today. Nerve conduction studies will be done on both arms and one leg, EMG will be done on one arm. There is no clear evidence of a peripheral neuropathy  on clinical examination.  Jill Alexanders MD 06/24/2016 11:41 AM  [facility] [address] [place], [place] [address]  Phone [phone] Fax [phone]

## 2016-06-26 LAB — ANGIOTENSIN CONVERTING ENZYME: ANGIO CONVERT ENZYME: 51 U/L (ref 14–82)

## 2016-06-26 LAB — SEDIMENTATION RATE: SED RATE: 2 mm/h (ref 0–40)

## 2016-06-26 LAB — COPPER, SERUM: COPPER: 91 ug/dL (ref 72–166)

## 2016-06-26 LAB — ANA W/REFLEX: Anti Nuclear Antibody(ANA): NEGATIVE

## 2016-06-26 LAB — B. BURGDORFI ANTIBODIES

## 2016-06-26 LAB — HIV ANTIBODY (ROUTINE TESTING W REFLEX): HIV Screen 4th Generation wRfx: NONREACTIVE

## 2016-06-26 LAB — RPR: RPR Ser Ql: NONREACTIVE

## 2016-06-26 LAB — VITAMIN B12: Vitamin B-12: 552 pg/mL (ref 211–946)

## 2016-06-26 LAB — RHEUMATOID FACTOR: Rhuematoid fact SerPl-aCnc: <10 IU/mL (ref 0.0–13.9)

## 2016-06-28 ENCOUNTER — Telehealth: Payer: Self-pay | Admitting: Neurology

## 2016-06-28 ENCOUNTER — Encounter: Payer: Self-pay | Admitting: Neurology

## 2016-06-28 ENCOUNTER — Ambulatory Visit (INDEPENDENT_AMBULATORY_CARE_PROVIDER_SITE_OTHER): Payer: BC Managed Care – PPO | Admitting: Neurology

## 2016-06-28 ENCOUNTER — Telehealth: Payer: Self-pay

## 2016-06-28 ENCOUNTER — Ambulatory Visit (INDEPENDENT_AMBULATORY_CARE_PROVIDER_SITE_OTHER): Payer: Self-pay | Admitting: Neurology

## 2016-06-28 DIAGNOSIS — R208 Other disturbances of skin sensation: Secondary | ICD-10-CM | POA: Diagnosis not present

## 2016-06-28 DIAGNOSIS — L280 Lichen simplex chronicus: Secondary | ICD-10-CM

## 2016-06-28 NOTE — Telephone Encounter (Signed)
Called pt w/ unremarkable lab results. May call back w/ additional questions/concerns. 

## 2016-06-28 NOTE — Progress Notes (Signed)
Please refer to EMG and nerve conduction study procedure note. 

## 2016-06-28 NOTE — Telephone Encounter (Signed)
-----   Message from Kathrynn Ducking, MD sent at 06/26/2016  8:15 AM EST -----  The blood work results are unremarkable. Please call the patient.  ----- Message ----- From: Lavone Neri Lab Results In Sent: 06/25/2016   7:43 AM To: Kathrynn Ducking, MD

## 2016-06-28 NOTE — Telephone Encounter (Signed)
error 

## 2016-06-28 NOTE — Procedures (Signed)
     HISTORY:  Isabel Atkinson is a 50 year old patient with a history of burning sensations and itching on the arms and upper chest, some with the feet and toes. The patient being evaluated for a possible neuropathy.  NERVE CONDUCTION STUDIES:  Nerve conduction studies were performed on both upper extremities. The distal motor latencies and motor amplitudes for the median and ulnar nerves were within normal limits. The F wave latencies and nerve conduction velocities for these nerves were also normal. The sensory latencies for the median and ulnar nerves were normal.  Nerve conduction studies were performed on the left lower extremity. The distal motor latencies and motor amplitudes for the peroneal and posterior tibial nerves were within normal limits. The nerve conduction velocities for these nerves were also normal. The H reflex latency was normal. The sensory latencies for the peroneal nerve and for the medial and lateral plantar sensory nerves were within normal limits.   EMG STUDIES:  EMG study was performed on the right upper extremity:  The first dorsal interosseous muscle reveals 2 to 4 K units with full recruitment. No fibrillations or positive waves were noted. The abductor pollicis brevis muscle reveals 2 to 4 K units with full recruitment. No fibrillations or positive waves were noted. The extensor indicis proprius muscle reveals 1 to 3 K units with full recruitment. No fibrillations or positive waves were noted. The pronator teres muscle reveals 2 to 3 K units with full recruitment. No fibrillations or positive waves were noted. The biceps muscle reveals 1 to 2 K units with full recruitment. No fibrillations or positive waves were noted. The triceps muscle reveals 2 to 4 K units with full recruitment. No fibrillations or positive waves were noted. The anterior deltoid muscle reveals 2 to 3 K units with full recruitment. No fibrillations or positive waves were noted. The  cervical paraspinal muscles were tested at 2 levels. No abnormalities of insertional activity were seen at either level tested. There was good relaxation.   IMPRESSION:  Nerve conduction studies done on both upper extremities and on the left lower extremity were within normal limits. There is no evidence of a peripheral neuropathy. A small fiber neuropathy cannot be excluded by standard nerve conduction studies. Clinical correlation is required. EMG evaluation of the right upper extremity was unremarkable, no evidence of an overlying cervical radiculopathy was seen.  Jill Alexanders MD 06/28/2016 1:53 PM  [facility] [address] [place], [place] [address]  Phone [phone] Fax [phone]

## 2016-07-04 NOTE — Progress Notes (Signed)
Cardiology Office Note   Date:  07/05/2016   ID:  Isabel Atkinson, DOB 1966-04-14, MRN [id number]  PCP:  Sallee Lange, MD  Cardiologist:  Sanaiya Welliver/ Jory Sims NP      History of Present Illness: Isabel Atkinson is a 50 y.o. female with a history of palpitations, COPD, GERD, and neuropathy.  Event monitor was neg for arrhythmia, Myoview was normal. Saw neurologist, Dr. Jannifer Atkinson for nerve conduction test.   Nerve conduction studies done on both upper extremities and on the left lower extremity were within normal limits. There is no evidence of a peripheral neuropathy.   She comes today with continued concerns of neuropathy. She denies frank chest pain dyspnea on exertion or near syncope.   Outpatient Medications Prior to Visit  Medication Sig Dispense Refill  . ALPRAZolam (XANAX) 0.25 MG tablet TAKE 1/2 TO 1 TABLET BY MOUTH TWICE DAILY AS NEEDED FOR ANXIETY. 30 tablet 2  . Calcium-Vitamin D-Vitamin K (CALCIUM SOFT CHEWS) [id number] MG-UNT-MCG CHEW Chew 2 each by mouth daily.    . cycloSPORINE (RESTASIS) 0.05 % ophthalmic emulsion Place 1 drop into both eyes 2 (two) times daily. For dry eyes    . gabapentin (NEURONTIN) 100 MG capsule Take 1 capsule (100 mg total) by mouth 3 (three) times daily. 90 capsule 3  . metoprolol succinate (TOPROL XL) 25 MG 24 hr tablet Take 0.5 tablets (12.5 mg total) by mouth daily. 45 tablet 3  . Multiple Vitamins-Minerals (MULTIVITAMIN WITH MINERALS) tablet Take 1 tablet by mouth daily.    Marland Kitchen omeprazole (PRILOSEC) 40 MG capsule     . amitriptyline (ELAVIL) 10 MG tablet Take 1 tablet (10 mg total) by mouth at bedtime. 30 tablet 2  . levofloxacin (LEVAQUIN) 500 MG tablet Take 1 tablet (500 mg total) by mouth daily. 10 tablet 0   No facility-administered medications prior to visit.      Allergies:   Carafate [sucralfate]; Celexa [citalopram hydrobromide]; Chantix [varenicline]; Sulfa antibiotics; Wellbutrin [bupropion]; and Fosamax [alendronate  sodium]   Past Medical History:  Diagnosis Date  . Anxiety   . Pre-diabetes   . Reactive airway disease   . Tachycardia     Past Surgical History:  Procedure Laterality Date  . COLONOSCOPY  2013   RMR: Normal rectum. single diminutive sigmoid polyp noted above. Normal terminal ileum. Status post segmental biopsy. next TCS in 10 years.   . ESOPHAGOGASTRODUODENOSCOPY  2013   RMR: Possible cervical esophageal web-status post dilation as described above. Small hiatal hernia. Status post biopsy of normal appearing small bowel to screen for celiac disease.   . ESOPHAGOGASTRODUODENOSCOPY N/A 05/06/2016   Procedure: ESOPHAGOGASTRODUODENOSCOPY (EGD);  Surgeon: Daneil Dolin, MD;  [facility];  Service: Endoscopy;  Laterality: N/A;  3:00 pm - pt knows to arrive at 2:30  . MALONEY DILATION N/A 05/06/2016   Procedure: Venia Minks DILATION;  Surgeon: Daneil Dolin, MD;  [facility];  Service: Endoscopy;  Laterality: N/A;  . NECK SURGERY  10/2012  . OVARIAN CYST SURGERY  1997   removed  . TUBAL LIGATION  2000     Social History:  The patient  reports that she has been smoking Cigarettes.  She has a 25.00 pack-year smoking history. She has never used smokeless tobacco. She reports that she does not drink alcohol or use drugs.   Family History:  The patient's family history includes Colon cancer (age of onset: 63) in her maternal uncle; Colon cancer (age of onset: 52) in  her maternal grandfather; Colon polyps (age of onset: 73) in her mother; Diabetes in her father; Heart disease in her father; Hyperlipidemia in her father; Hypertension in her father.    ROS:  Please see the history of present illness. All other systems are reviewed and  Negative to the above problem except as noted.    PHYSICAL EXAM: VS:  BP 124/74   Pulse 79   Ht 5\' 2"  (1.575 m)   Wt 98 lb (44.5 kg)   SpO2 99%   BMI 17.92 kg/m   GEN: Well nourished, well developed, in no acute distress  HEENT: normal   Neck: no JVD, carotid bruits, or masses Cardiac: RRR; no murmurs, rubs, or gallops,no edema  Respiratory:  clear to auscultation bilaterally, normal work of breathing GI: soft, nontender, nondistended, + BS  No hepatomegaly  MS: no deformity Moving all extremities   Skin: warm and dry, no rash Neuro:  Strength and sensation are intact Psych: euthymic mood, full affect    Lipid Panel    Component Value Date/Time   CHOL 191 04/25/2015 0928   TRIG 89 04/25/2015 0928   HDL 76 04/25/2015 0928   CHOLHDL 2.5 04/25/2015 0928   CHOLHDL 2.5 11/03/2013 0951   VLDL 13 11/03/2013 0951   LDLCALC 97 04/25/2015 0928      Wt Readings from Last 3 Encounters:  07/05/16 98 lb (44.5 kg)  06/24/16 96 lb 12 oz (43.9 kg)  06/18/16 98 lb 6.4 oz (44.6 kg)      ASSESSMENT AND PLAN:  1. Palpitations: She has very little symptoms at this time. She will continue on metoprolol as directed. She asked if she needed to take it daily or as needed, and I have advised her to take it daily. We will see her when necessary as no further cardiac testing is necessary at this time.  2. Ongoing tobacco abuse: She states that she has been diagnosed with mild COPD. I have strongly recommended that she stop smoking. This may help her breathing, neuropathy, and improve her overall health and longevity. She verbalizes understanding   Current medicines are reviewed at length with the patient today.  The patient does not have concerns regarding medicines.  Signed, Jory Sims, NP  07/05/2016 [facility] [place], [place], [place]  [address] Phone: [phone]; Fax: [phone]

## 2016-07-05 ENCOUNTER — Encounter: Payer: Self-pay | Admitting: Adult Health

## 2016-07-05 ENCOUNTER — Ambulatory Visit (INDEPENDENT_AMBULATORY_CARE_PROVIDER_SITE_OTHER): Payer: BC Managed Care – PPO | Admitting: Adult Health

## 2016-07-05 VITALS — BP 124/74 | HR 79 | Ht 62.0 in | Wt 98.0 lb

## 2016-07-05 DIAGNOSIS — R002 Palpitations: Secondary | ICD-10-CM | POA: Diagnosis not present

## 2016-07-05 DIAGNOSIS — Z720 Tobacco use: Secondary | ICD-10-CM

## 2016-07-05 MED ORDER — METOPROLOL SUCCINATE ER 25 MG PO TB24: 12.5000 mg | ORAL_TABLET | Freq: Every day | ORAL | 3 refills | Status: DC

## 2016-07-05 NOTE — Progress Notes (Signed)
Name: Isabel Atkinson    DOB: 02/06/1966  Age: 50 y.o.  MR#: [id number]       PCP:  Sallee Lange, MD      Insurance: Payor: [employer] [place] / Plan: [place] PPO / Product Type: *No Product type* /   CC:    Chief Complaint  Patient presents with  . Palpitations    VS Vitals:   07/05/16 1336  Pulse: 79  SpO2: 99%  Weight: 98 lb (44.5 kg)  Height: 5\' 2"  (1.575 m)    Weights Current Weight  07/05/16 98 lb (44.5 kg)  06/24/16 96 lb 12 oz (43.9 kg)  06/18/16 98 lb 6.4 oz (44.6 kg)    Blood Pressure  BP Readings from Last 3 Encounters:  06/24/16 108/76  06/18/16 100/62  06/01/16 120/77     Admit date:  (Not on file) Last encounter with RMR:  Visit date not found   Allergy Carafate [sucralfate]; Celexa [citalopram hydrobromide]; Chantix [varenicline]; Sulfa antibiotics; Wellbutrin [bupropion]; and Fosamax [alendronate sodium]  Current Outpatient Prescriptions  Medication Sig Dispense Refill  . ALPRAZolam (XANAX) 0.25 MG tablet TAKE 1/2 TO 1 TABLET BY MOUTH TWICE DAILY AS NEEDED FOR ANXIETY. 30 tablet 2  . Calcium-Vitamin D-Vitamin K (CALCIUM SOFT CHEWS) [id number] MG-UNT-MCG CHEW Chew 2 each by mouth daily.    . cycloSPORINE (RESTASIS) 0.05 % ophthalmic emulsion Place 1 drop into both eyes 2 (two) times daily. For dry eyes    . gabapentin (NEURONTIN) 100 MG capsule Take 1 capsule (100 mg total) by mouth 3 (three) times daily. 90 capsule 3  . metoprolol succinate (TOPROL XL) 25 MG 24 hr tablet Take 0.5 tablets (12.5 mg total) by mouth daily. 45 tablet 3  . Multiple Vitamins-Minerals (MULTIVITAMIN WITH MINERALS) tablet Take 1 tablet by mouth daily.    Marland Kitchen omeprazole (PRILOSEC) 40 MG capsule      No current facility-administered medications for this visit.     Discontinued Meds:    Medications Discontinued During This Encounter  Medication Reason  . amitriptyline (ELAVIL) 10 MG tablet Error  . levofloxacin (LEVAQUIN) 500 MG tablet Error    Patient Active  Problem List   Diagnosis Date Noted  . 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 COPD (chronic obstructive pulmonary disease) ([place]) 06/10/2016  . Abdominal pain, epigastric 05/04/2016  . Gastritis 04/25/2015  . Osteoporosis 07/20/2014  . Irritable bowel syndrome 07/05/2014  . Nausea 01/11/2012  . Esophageal dysphagia 01/11/2012  . LUQ pain 01/11/2012  . GERD (gastroesophageal reflux disease) 01/11/2012  . Chronic diarrhea 01/11/2012    LABS    Component Value Date/Time   NA 142 04/13/2016 0155   NA 143 01/14/2016 0848   NA 138 11/11/2015 1156   NA 139 09/05/2015 1518   NA 141 08/21/2015 1450   NA 139 06/19/2015 1357   K 3.0 (L) 04/13/2016 0155   K 3.8 01/14/2016 0848   K 3.2 (L) 11/11/2015 1156   CL 106 04/13/2016 0155   CL 104 01/14/2016 0848   CL 104 11/11/2015 1156   CO2 27 04/13/2016 0155   CO2 25 01/14/2016 0848   CO2 24 11/11/2015 1156   GLUCOSE 106 (H) 04/13/2016 0155   GLUCOSE 83 01/14/2016 0848   GLUCOSE 146 (H) 11/11/2015 1156   GLUCOSE 101 (H) 09/05/2015 1518   GLUCOSE 104 (H) 08/21/2015 1450   BUN 15 04/13/2016 0155   BUN 16 01/14/2016 0848   BUN 12 11/11/2015 1156   BUN 13 09/05/2015 1518  BUN 18 08/21/2015 1450   BUN 16 06/19/2015 1357   CREATININE 0.43 (L) 04/13/2016 0155   CREATININE 0.54 (L) 01/14/2016 0848   CREATININE 0.70 11/11/2015 1156   CREATININE 0.55 11/03/2013 0951   CALCIUM 9.3 04/13/2016 0155   CALCIUM 9.3 01/14/2016 0848   CALCIUM 8.5 (L) 11/11/2015 1156   GFRNONAA >60 04/13/2016 0155   GFRNONAA 110 01/14/2016 0848   GFRNONAA >60 11/11/2015 1156   GFRAA >60 04/13/2016 0155   GFRAA 126 01/14/2016 0848   GFRAA >60 11/11/2015 1156   CMP     Component Value Date/Time   NA 142 04/13/2016 0155   NA 143 01/14/2016 0848   K 3.0 (L) 04/13/2016 0155   CL 106 04/13/2016 0155   CO2 27 04/13/2016 0155   GLUCOSE 106 (H) 04/13/2016 0155   BUN 15 04/13/2016 0155   BUN 16 01/14/2016 0848   CREATININE 0.43 (L) 04/13/2016 0155   CREATININE 0.55  11/03/2013 0951   CALCIUM 9.3 04/13/2016 0155   PROT 6.5 04/13/2016 0155   PROT 6.3 09/10/2015 1455   PROT 7.0 12/09/2011 0900   ALBUMIN 4.3 04/13/2016 0155   ALBUMIN 4.4 09/10/2015 1455   AST 18 04/13/2016 0155   AST 18 12/09/2011 0900   ALT 14 04/13/2016 0155   ALKPHOS 58 04/13/2016 0155   ALKPHOS 76 12/09/2011 0900   BILITOT 0.3 04/13/2016 0155   BILITOT <0.2 09/10/2015 1455   BILITOT 0.3 12/09/2011 0900   GFRNONAA >60 04/13/2016 0155   GFRAA >60 04/13/2016 0155       Component Value Date/Time   WBC 7.7 04/13/2016 0155   WBC 6.1 01/14/2016 0848   WBC 19.2 (H) 11/11/2015 1156   WBC 7.4 09/10/2015 1455   WBC 7.6 06/16/2015 2014   HGB 12.4 04/13/2016 0155   HGB 12.6 11/11/2015 1156   HGB 12.2 06/16/2015 2014   HCT 37.9 04/13/2016 0155   HCT 39.3 01/14/2016 0848   HCT 37.5 11/11/2015 1156   HCT 39.5 09/10/2015 1455   HCT 36.9 06/16/2015 2014   HCT 41.1 04/25/2015 0928   HCT 41 12/09/2011 0900   MCV 90.5 04/13/2016 0155   MCV 89 01/14/2016 0848   MCV 89.7 11/11/2015 1156   MCV 88 09/10/2015 1455   MCV 91.1 06/16/2015 2014   MCV 91 04/25/2015 0928    Lipid Panel     Component Value Date/Time   CHOL 191 04/25/2015 0928   TRIG 89 04/25/2015 0928   HDL 76 04/25/2015 0928   CHOLHDL 2.5 04/25/2015 0928   CHOLHDL 2.5 11/03/2013 0951   VLDL 13 11/03/2013 0951   LDLCALC 97 04/25/2015 0928    ABG No results found for: PHART, PCO2ART, PO2ART, HCO3, TCO2, ACIDBASEDEF, O2SAT   Lab Results  Component Value Date   TSH 1.030 01/14/2016   BNP (last 3 results) No results for input(s): BNP in the last 8760 hours.  ProBNP (last 3 results) No results for input(s): PROBNP in the last 8760 hours.  Cardiac Panel (last 3 results) No results for input(s): CKTOTAL, CKMB, TROPONINI, RELINDX in the last 72 hours.  Iron/TIBC/Ferritin/ %Sat    Component Value Date/Time   IRON 103 12/09/2011 0900   TIBC 396 12/09/2011 0900   FERRITIN 39.0 12/09/2011 0900   IRONPCTSAT 26  12/09/2011 0900     EKG Orders placed or performed in visit on 04/21/16  . EKG 12-Lead     Prior Assessment and Plan Problem List as of 07/05/2016 Reviewed: 06/28/2016  1:59 PM by Margette Fast  KEITH, MD     Respiratory   COPD (chronic obstructive pulmonary disease) [facility])     Digestive   Esophageal dysphagia   Last Assessment & Plan 01/11/2012 Office Visit Written 01/11/2012  4:57 PM by Andria Meuse, NP    EGD with possible esophageal dilation with Dr. Gala Romney to look for esophageal web, ring, or stricture.      GERD (gastroesophageal reflux disease)   Last Assessment & Plan 05/04/2016 Office Visit Written 05/04/2016 10:47 AM by Mahala Menghini, PA-C    50 year old female presenting with 3-4 week history of epigastric burning, burning quality radiating into the chest/throat/down the arms. Cardiology workup including stress test unremarkable. No significant improvement in symptoms with increase to Dexilant 30 mg twice a day. Denies NSAID or aspirin use. Recent Z-Pak for pharyngitis, no erythema of the pharynx noted today although she did have bilateral tonsilliths. She does have some mild solid food dysphagia. Previous EGD 4 years ago with possible cervical esophageal web. Discussed with patient at length. Differential diagnosis includes refractory GERD, esophagitis related to viral etiology although less likely given persistent symptoms, candida esophagitis. Need to rule out PUD. Recommend EGD plus or minus esophageal dilation in the near future with Dr. Gala Romney.  I have discussed the risks, alternatives, benefits with regards to but not limited to the risk of reaction to medication, bleeding, infection, perforation and the patient is agreeable to proceed. Written consent to be obtained.  Anti-reflex measures for now. Discussed, handout provided also. Further recommendations to follow.      Chronic diarrhea   Last Assessment & Plan 01/11/2012 Office Visit Written 01/11/2012  4:57 PM by Andria Meuse, NP    History of worsening of chronic diarrhea over the past 3 months.  Self diagnosed with possible irritable bowel syndrome, but symptoms exacerbated with left upper quadrant pain over the last 3 months.  Differentials include irritable bowel syndrome, celiac disease, colorectal carcinoma or polyp, less likely microscopic colitis or pancreatic insufficiency. Colonoscopy for further evaluation.  I have discussed risks & benefits which include, but are not limited to, bleeding, infection, perforation & drug reaction.  The patient agrees with this plan & written consent will be obtained.  Full set of Stool studies if diarrhea persists.  Use Levsin 0.125 mg before meals and at bedtime if you need for diarrhea/cramps         Irritable bowel syndrome   Gastritis     Musculoskeletal and Integument   Osteoporosis   Neurodermatitis     Other   Nausea   Last Assessment & Plan 01/11/2012 Office Visit Written 01/11/2012  4:57 PM by Andria Meuse, NP    See LUQ pain/GERD      LUQ pain   Last Assessment & Plan 01/11/2012 Office Visit Written 01/11/2012  4:53 PM by Andria Meuse, NP    Karena Addison is a pleasant 50 y.o. female with three-month history of left upper quadrant pain, nausea, heart burn, indigestion and intermittent diarrhea.  He has had minimal relief with PPI.  EGD for further evaluation to rule out gastritis, H. pylori, peptic ulcer disease, celiac disease.  I have discussed risks & benefits which include, but are not limited to, bleeding, infection, perforation & drug reaction.  The patient agrees with this plan & written consent will be obtained.          Abdominal pain, epigastric       Imaging: No results found.

## 2016-07-05 NOTE — Patient Instructions (Signed)
Your physician recommends that you schedule a follow-up appointment As Needed  Your physician recommends that you continue on your current medications as directed. Please refer to the Current Medication list given to you today.  If you need a refill on your cardiac medications before your next appointment, please call your pharmacy.  Thank you for choosing [facility]!

## 2016-07-13 ENCOUNTER — Other Ambulatory Visit: Payer: Self-pay | Admitting: Family Medicine

## 2016-07-13 DIAGNOSIS — Z1231 Encounter for screening mammogram for malignant neoplasm of breast: Secondary | ICD-10-CM

## 2016-07-15 ENCOUNTER — Ambulatory Visit: Payer: BC Managed Care – PPO | Admitting: Family Medicine

## 2016-08-11 ENCOUNTER — Ambulatory Visit (HOSPITAL_COMMUNITY)
Admission: RE | Admit: 2016-08-11 | Discharge: 2016-08-11 | Disposition: A | Discharge status: Home / Self Care | Payer: BC Managed Care – PPO | Source: Ambulatory Visit | Attending: Family Medicine | Admitting: Family Medicine

## 2016-08-11 DIAGNOSIS — Z1231 Encounter for screening mammogram for malignant neoplasm of breast: Secondary | ICD-10-CM | POA: Diagnosis present

## 2016-08-17 ENCOUNTER — Encounter: Payer: Self-pay | Admitting: Family Medicine

## 2016-08-17 ENCOUNTER — Ambulatory Visit (INDEPENDENT_AMBULATORY_CARE_PROVIDER_SITE_OTHER): Payer: BC Managed Care – PPO | Admitting: Family Medicine

## 2016-08-17 VITALS — BP 122/82 | Temp 98.4°F | Ht 62.0 in | Wt 100.4 lb

## 2016-08-17 DIAGNOSIS — J310 Chronic rhinitis: Secondary | ICD-10-CM

## 2016-08-17 DIAGNOSIS — J329 Chronic sinusitis, unspecified: Secondary | ICD-10-CM

## 2016-08-17 MED ORDER — CEFPROZIL 500 MG PO TABS: 500.0000 mg | ORAL_TABLET | Freq: Two times a day (BID) | ORAL | 0 refills | Status: DC

## 2016-08-17 NOTE — Progress Notes (Signed)
   Subjective:    Patient ID: Isabel Atkinson, female    DOB: 1965-08-22, 51 y.o.   MRN: [id number]  Cough  This is a new problem. The current episode started in the past 7 days. Associated symptoms include headaches, nasal congestion and a sore throat. Associated symptoms comments: Sinus pressure . Treatments tried: tylenol.   Headache progressive over siven days  Frontal pain  Sore throat,  Runny nose  No fever, taking tylenol for pain, nasocor t prn  '  Two g kids have been sick , and may be strep      Review of Systems  HENT: Positive for sore throat.   Respiratory: Positive for cough.   Neurological: Positive for headaches.       Objective:   Physical Exam Alert, mild malaise. Hydration good Vitals stable. frontal/ maxillary tenderness evident positive nasal congestion. pharynx normal neck supple  lungs clear/no crackles or wheezes. heart regular in rhythm        Assessment & Plan:  WorseImpression rhinosinusitis likely post viral, discussed with patient. plan antibiotics prescribed. Questions answered. Symptomatic care discussed. warning signs discussed. WSL

## 2016-08-24 ENCOUNTER — Other Ambulatory Visit: Payer: Self-pay | Admitting: Family Medicine

## 2016-08-24 NOTE — Telephone Encounter (Signed)
May have this +3 refills 

## 2016-09-16 ENCOUNTER — Encounter: Payer: Self-pay | Admitting: Family Medicine

## 2016-09-16 ENCOUNTER — Ambulatory Visit (INDEPENDENT_AMBULATORY_CARE_PROVIDER_SITE_OTHER): Payer: BC Managed Care – PPO | Admitting: Family Medicine

## 2016-09-16 VITALS — BP 100/74 | Temp 99.3°F | Ht 62.0 in | Wt 103.4 lb

## 2016-09-16 DIAGNOSIS — J449 Chronic obstructive pulmonary disease, unspecified: Secondary | ICD-10-CM

## 2016-09-16 DIAGNOSIS — J019 Acute sinusitis, unspecified: Secondary | ICD-10-CM | POA: Diagnosis not present

## 2016-09-16 DIAGNOSIS — B9689 Other specified bacterial agents as the cause of diseases classified elsewhere: Secondary | ICD-10-CM | POA: Diagnosis not present

## 2016-09-16 MED ORDER — DOXYCYCLINE HYCLATE 100 MG PO CAPS: 100.0000 mg | ORAL_CAPSULE | Freq: Two times a day (BID) | ORAL | 0 refills | Status: DC

## 2016-09-16 NOTE — Progress Notes (Signed)
   Subjective:    Patient ID: Isabel Atkinson, female    DOB: [birth year], 51 y.o.   MRN: [id number]  Sinusitis  This is a new problem. The current episode started in the past 7 days. The problem is unchanged. Maximum temperature: 99.3. The pain is moderate. Associated symptoms include congestion, coughing, headaches and sneezing. Pertinent negatives include no ear pain or shortness of breath. Treatments tried: Coricidin. The treatment provided no relief.  Patient also has an area on the right lower part of her nose it's irritated encrusting    Review of Systems  Constitutional: Negative for activity change, appetite change and fever.  HENT: Positive for congestion, rhinorrhea and sneezing. Negative for ear pain.   Eyes: Negative for discharge.  Respiratory: Positive for cough. Negative for shortness of breath and wheezing.   Cardiovascular: Negative for chest pain.  Neurological: Positive for headaches.       Objective:   Physical Exam  Constitutional: She appears well-developed.  HENT:  Head: Normocephalic.  Nose: Nose normal.  Mouth/Throat: Oropharynx is clear and moist. No oropharyngeal exudate.  Neck: Neck supple.  Cardiovascular: Normal rate and normal heart sounds.   No murmur heard. Pulmonary/Chest: Effort normal and breath sounds normal. She has no wheezes.  Lymphadenopathy:    She has no cervical adenopathy.  Skin: Skin is warm and dry.  Nursing note and vitals reviewed.   Patient has what appears to be impetigo on the lower part of her right nostril region      Assessment & Plan:  Early COPD per pulmonary function test last year patient was counseled to quit smoking  Secondary sinusitis antibiotics prescribed warning signs discuss  No sign and pneumonia or the flu warning signs discussed

## 2016-11-01 ENCOUNTER — Ambulatory Visit (INDEPENDENT_AMBULATORY_CARE_PROVIDER_SITE_OTHER): Payer: BC Managed Care – PPO | Admitting: Family Medicine

## 2016-11-01 ENCOUNTER — Encounter: Payer: Self-pay | Admitting: Family Medicine

## 2016-11-01 VITALS — BP 130/80 | Temp 99.1°F | Ht 62.0 in | Wt 101.2 lb

## 2016-11-01 DIAGNOSIS — J329 Chronic sinusitis, unspecified: Secondary | ICD-10-CM

## 2016-11-01 DIAGNOSIS — J029 Acute pharyngitis, unspecified: Secondary | ICD-10-CM | POA: Diagnosis not present

## 2016-11-01 DIAGNOSIS — J310 Chronic rhinitis: Secondary | ICD-10-CM

## 2016-11-01 LAB — POCT RAPID STREP A (OFFICE): RAPID STREP A SCREEN: NEGATIVE

## 2016-11-01 MED ORDER — LEVOFLOXACIN 500 MG PO TABS: 500.0000 mg | ORAL_TABLET | Freq: Every day | ORAL | 0 refills | Status: AC

## 2016-11-01 NOTE — Progress Notes (Signed)
   Subjective:    Patient ID: Isabel Atkinson, female    DOB: 27-Jan-1966, 51 y.o.   MRN: [id number]  Sore Throat   This is a new problem. The current episode started in the past 7 days. Associated symptoms include coughing, ear pain and headaches.   Patient states no other concerns this visit.  Results for orders placed or performed in visit on 11/01/16  POCT rapid strep A  Result Value Ref Range   Rapid Strep A Screen Negative Negative    Started five d ago,  Headache frontal and sore thoray  Whit e patches on th throat  Some  Pos incr pressure, Both frontal and cheeks. Comes and goes achy at times worse with cough  Slight wheezing at ties    Review of Systems  HENT: Positive for ear pain.   Respiratory: Positive for cough.   Neurological: Positive for headaches.       Objective:   Physical Exam  Alert, mild malaise. Hydration good Vitals stable. frontal/ maxillary tenderness evident positive nasal congestion. pharynx normal neck supple  lungs clear/no crackles or wheezes. heart regular in rhythm       Assessment & Plan:  Impression rhinosinusitis likely post viral, discussed with patient. plan antibiotics prescribed. Questions answered. Symptomatic care discussed. warning signs discussed. WSL

## 2016-11-02 LAB — STREP A DNA PROBE: STREP GP A DIRECT, DNA PROBE: NEGATIVE

## 2016-11-08 ENCOUNTER — Encounter: Payer: Self-pay | Admitting: Neurology

## 2016-11-08 ENCOUNTER — Ambulatory Visit (INDEPENDENT_AMBULATORY_CARE_PROVIDER_SITE_OTHER): Payer: BC Managed Care – PPO | Admitting: Neurology

## 2016-11-08 VITALS — BP 120/72 | HR 70 | Resp 12 | Ht 62.0 in | Wt 100.0 lb

## 2016-11-08 DIAGNOSIS — L280 Lichen simplex chronicus: Secondary | ICD-10-CM

## 2016-11-08 NOTE — Progress Notes (Signed)
Reason for visit: Dysesthesias  Isabel Atkinson is an 51 y.o. female  History of present illness:  Isabel Atkinson is a 51 year old right-handed white female with a history of sensory dysesthesias involving upper chest, arms, occasionally the medial thighs. The patient has been placed on gabapentin after blood work, EMG and nerve conduction study evaluation, and cervical spine evaluation has not shown any other etiology of her symptomatology. She has got much better taking only one or 2 of the 100 mg gabapentin capsules daily, higher doses make her drowsy. She is sleeping well at night. If she is particularly active with using her arms during the day, she had increased discomfort. She returns for an evaluation.  Past Medical History:  Diagnosis Date  . Anxiety   . Pre-diabetes   . Reactive airway disease   . Tachycardia     Past Surgical History:  Procedure Laterality Date  . COLONOSCOPY  2013   RMR: Normal rectum. single diminutive sigmoid polyp noted above. Normal terminal ileum. Status post segmental biopsy. next TCS in 10 years.   . ESOPHAGOGASTRODUODENOSCOPY  2013   RMR: Possible cervical esophageal web-status post dilation as described above. Small hiatal hernia. Status post biopsy of normal appearing small bowel to screen for celiac disease.   . ESOPHAGOGASTRODUODENOSCOPY N/A 05/06/2016   Procedure: ESOPHAGOGASTRODUODENOSCOPY (EGD);  Surgeon: Daneil Dolin, MD;  Location: [facility];  Service: Endoscopy;  Laterality: N/A;  3:00 pm - pt knows to arrive at 2:30  . MALONEY DILATION N/A 05/06/2016   Procedure: Venia Minks DILATION;  Surgeon: Daneil Dolin, MD;  Location: [facility];  Service: Endoscopy;  Laterality: N/A;  . NECK SURGERY  10/2012  . OVARIAN CYST SURGERY  1997   removed  . TUBAL LIGATION  2000    Family History  Problem Relation Age of Onset  . Colon cancer Maternal Grandfather 75  . Colon polyps Mother 71  . Colon cancer Maternal Uncle 51  . Hypertension  Father   . Diabetes Father   . Heart disease Father   . Hyperlipidemia Father     Social history:  reports that she has been smoking Cigarettes.  She has a 25.00 pack-year smoking history. She has never used smokeless tobacco. She reports that she does not drink alcohol or use drugs.    Allergies  Allergen Reactions  . Carafate [Sucralfate]     Mouth tingling  . Celexa [Citalopram Hydrobromide]     Side effects  . Chantix [Varenicline]     Side effects  . Sulfa Antibiotics Itching  . Wellbutrin [Bupropion]     Severe weight loss and loss of appetite  . Fosamax [Alendronate Sodium] Nausea And Vomiting    GERD can not tolerate    Medications:  Prior to Admission medications   Medication Sig Start Date End Date Taking? Authorizing Provider  ALPRAZolam (XANAX) 0.25 MG tablet TAKE 1/2 TO 1 TABLET BY MOUTH TWICE DAILY AS NEEDED FOR ANXIETY. 08/27/16  Yes Kathyrn Drown, MD  Bioflavonoid Products (ESTER C PO) Take by mouth.   Yes Historical Provider, MD  Calcium-Vitamin D-Vitamin K (CALCIUM SOFT CHEWS) [id number] MG-UNT-MCG CHEW Chew 2 each by mouth daily.   Yes Historical Provider, MD  cycloSPORINE (RESTASIS) 0.05 % ophthalmic emulsion Place 1 drop into both eyes 2 (two) times daily. For dry eyes   Yes Historical Provider, MD  gabapentin (NEURONTIN) 100 MG capsule Take 1 capsule (100 mg total) by mouth 3 (three) times daily. 06/24/16  Yes  Kathrynn Ducking, MD  levofloxacin (LEVAQUIN) 500 MG tablet Take 1 tablet (500 mg total) by mouth daily. 11/01/16 11/11/16 Yes Mikey Kirschner, MD  metoprolol succinate (TOPROL XL) 25 MG 24 hr tablet Take 0.5 tablets (12.5 mg total) by mouth daily. 07/05/16  Yes Lendon Colonel, NP  Multiple Vitamins-Minerals (MULTIVITAMIN WITH MINERALS) tablet Take 1 tablet by mouth daily.   Yes Historical Provider, MD  omeprazole (PRILOSEC) 40 MG capsule  06/01/16  Yes Historical Provider, MD    ROS:  Out of a complete 14 system review of symptoms, the patient  complains only of the following symptoms, and all other reviewed systems are negative.  Arm discomfort  Blood pressure 120/72, pulse 70, resp. rate 12, height 5\' 2"  (1.575 m), weight 100 lb (45.4 kg).  Physical Exam  General: The patient is alert and cooperative at the time of the examination.  Skin: No significant peripheral edema is noted.   Neurologic Exam  Mental status: The patient is alert and oriented x 3 at the time of the examination. The patient has apparent normal recent and remote memory, with an apparently normal attention span and concentration ability.   Cranial nerves: Facial symmetry is present. Speech is normal, no aphasia or dysarthria is noted. Extraocular movements are full. Visual fields are full.  Motor: The patient has good strength in all 4 extremities.  Sensory examination: Soft touch sensation is symmetric on the face, arms, and legs.  Coordination: The patient has good finger-nose-finger and heel-to-shin bilaterally.  Gait and station: The patient has a normal gait. Tandem gait is normal. Romberg is negative. No drift is seen.  Reflexes: Deep tendon reflexes are symmetric.   Assessment/Plan:  1. Arm discomfort  The patient has burning dysesthesias that are mainly in the hands and arms. The patient may have a neurodermatitis. She has responded well to the use of gabapentin. She uses a very low dose, we will continue the medication for now. She will follow-up in 6 months.   Jill Alexanders MD 11/08/2016 4:20 PM  [facility] [address] [place], [place] [address]  Phone [phone] Fax [phone]

## 2016-11-18 ENCOUNTER — Telehealth: Payer: Self-pay | Admitting: Family Medicine

## 2016-11-18 MED ORDER — CEFDINIR 300 MG PO CAPS: 300.0000 mg | ORAL_CAPSULE | Freq: Two times a day (BID) | ORAL | 0 refills | Status: DC

## 2016-11-18 NOTE — Telephone Encounter (Signed)
Prescription sent electronically to pharmacy. Patient notified. 

## 2016-11-18 NOTE — Telephone Encounter (Signed)
Patient was given Levaquin for sinus infection on 11/01/2016. Patient feels like the sinus infection has returned and would like another antibiotic called in

## 2016-11-18 NOTE — Telephone Encounter (Signed)
Patient seen Dr. Richardson Landry on 11/01/16 for sore throat and rhinosinusitis.  She finished her antibiotic, but is still experiencing left side facial pain, a little bit of cough, headache, sore throat.  She wants to know if we can call in another antibiotic.  [employer]

## 2016-11-18 NOTE — Telephone Encounter (Signed)
I would not recommend a second round of Levaquin currently but Omnicef 300 mg 1 twice a day for 10 days may be sending

## 2016-12-01 ENCOUNTER — Other Ambulatory Visit: Payer: Self-pay | Admitting: Internal Medicine

## 2016-12-13 ENCOUNTER — Encounter: Payer: Self-pay | Admitting: Family Medicine

## 2016-12-13 ENCOUNTER — Ambulatory Visit (INDEPENDENT_AMBULATORY_CARE_PROVIDER_SITE_OTHER): Payer: BC Managed Care – PPO | Admitting: Family Medicine

## 2016-12-13 VITALS — BP 120/70 | Temp 98.9°F | Ht 62.0 in | Wt 99.2 lb

## 2016-12-13 DIAGNOSIS — J029 Acute pharyngitis, unspecified: Secondary | ICD-10-CM

## 2016-12-13 DIAGNOSIS — J00 Acute nasopharyngitis [common cold]: Secondary | ICD-10-CM | POA: Diagnosis not present

## 2016-12-13 DIAGNOSIS — B349 Viral infection, unspecified: Secondary | ICD-10-CM | POA: Diagnosis not present

## 2016-12-13 LAB — POCT RAPID STREP A (OFFICE): RAPID STREP A SCREEN: NEGATIVE

## 2016-12-13 MED ORDER — AMOXICILLIN 500 MG PO TABS: 500.0000 mg | ORAL_TABLET | Freq: Three times a day (TID) | ORAL | 0 refills | Status: DC

## 2016-12-13 MED ORDER — HYDROCODONE-ACETAMINOPHEN 5-325 MG PO TABS: 1.0000 | ORAL_TABLET | Freq: Four times a day (QID) | ORAL | 0 refills | Status: DC | PRN

## 2016-12-13 NOTE — Progress Notes (Signed)
   Subjective:    Patient ID: Isabel Atkinson, female    DOB: [birth year], 51 y.o.   MRN: [id number]  Sore Throat   This is a new problem. The current episode started in the past 7 days. The maximum temperature recorded prior to her arrival was 101 - 101.9 F. Associated symptoms include abdominal pain and headaches.  Severe viral gastroenteritis last week several days of nausea vomiting diarrhea. Over the weekend felt fatigue tiredness now with some moderate headache postnasal drainage did have fever. No fever today. No wheezing or difficulty breathing. States phlegm is clear. PMH benign. Patient states no other concerns this visit.   Review of Systems  Gastrointestinal: Positive for abdominal pain.  Neurological: Positive for headaches.       Objective:   Physical Exam  Lungs are clear hearts regular throat nonerythematous eardrums normal sinuses nontender blood pressure good      Assessment & Plan:  Post viral illness post viral upper respiratory Headache related to viral syndrome Hydrocodone as needed for pain caution drowsiness Amoxicillin given in case drainage becomes indicative of a sinus infection-get filled if necessary If not improving over the course of the next 5-6 days call back we will do lab work call back sooner if worse

## 2016-12-14 LAB — STREP A DNA PROBE: Strep Gp A Direct, DNA Probe: NEGATIVE

## 2017-01-10 NOTE — Patient Instructions (Signed)
Your procedure is scheduled on: 01/17/2017  Report to [facility] at  700   AM.  Call this number if you have problems the morning of surgery: [phone]   Do not eat food or drink liquids :After Midnight.      Take these medicines the morning of surgery with A SIP OF WATER: xanax, metoprolol.   Do not wear jewelry, make-up or nail polish.  Do not wear lotions, powders, or perfumes. You may wear deodorant.  Do not shave 48 hours prior to surgery.  Do not bring valuables to the hospital.  Contacts, dentures or bridgework may not be worn into surgery.  Leave suitcase in the car. After surgery it may be brought to your room.  For patients admitted to the hospital, checkout time is 11:00 AM the day of discharge.   Patients discharged the day of surgery will not be allowed to drive home.  :     Please read over the following fact sheets that you were given: Coughing and Deep Breathing, Surgical Site Infection Prevention, Anesthesia Post-op Instructions and Care and Recovery After Surgery    Cataract A cataract is a clouding of the lens of the eye. When a lens becomes cloudy, vision is reduced based on the degree and nature of the clouding. Many cataracts reduce vision to some degree. Some cataracts make people more near-sighted as they develop. Other cataracts increase glare. Cataracts that are ignored and become worse can sometimes look white. The white color can be seen through the pupil. CAUSES   Aging. However, cataracts may occur at any age, even in newborns.   Certain drugs.   Trauma to the eye.   Certain diseases such as diabetes.   Specific eye diseases such as chronic inflammation inside the eye or a sudden attack of a rare form of glaucoma.   Inherited or acquired medical problems.  SYMPTOMS   Gradual, progressive drop in vision in the affected eye.   Severe, rapid visual loss. This most often happens when trauma is the cause.  DIAGNOSIS  To detect a cataract, an eye  doctor examines the lens. Cataracts are best diagnosed with an exam of the eyes with the pupils enlarged (dilated) by drops.  TREATMENT  For an early cataract, vision may improve by using different eyeglasses or stronger lighting. If that does not help your vision, surgery is the only effective treatment. A cataract needs to be surgically removed when vision loss interferes with your everyday activities, such as driving, reading, or watching TV. A cataract may also have to be removed if it prevents examination or treatment of another eye problem. Surgery removes the cloudy lens and usually replaces it with a substitute lens (intraocular lens, IOL).  At a time when both you and your doctor agree, the cataract will be surgically removed. If you have cataracts in both eyes, only one is usually removed at a time. This allows the operated eye to heal and be out of danger from any possible problems after surgery (such as infection or poor wound healing). In rare cases, a cataract may be doing damage to your eye. In these cases, your caregiver may advise surgical removal right away. The vast majority of people who have cataract surgery have better vision afterward. HOME CARE INSTRUCTIONS  If you are not planning surgery, you may be asked to do the following:  Use different eyeglasses.   Use stronger or brighter lighting.   Ask your eye doctor about reducing your medicine  dose or changing medicines if it is thought that a medicine caused your cataract. Changing medicines does not make the cataract go away on its own.   Become familiar with your surroundings. Poor vision can lead to injury. Avoid bumping into things on the affected side. You are at a higher risk for tripping or falling.   Exercise extreme care when driving or operating machinery.   Wear sunglasses if you are sensitive to bright light or experiencing problems with glare.  SEEK IMMEDIATE MEDICAL CARE IF:   You have a worsening or sudden  vision loss.   You notice redness, swelling, or increasing pain in the eye.   You have a fever.  Document Released: 07/26/2005 Document Revised: 07/15/2011 Document Reviewed: 03/19/2011 [facility] Patient Information 2012 [place].PATIENT INSTRUCTIONS POST-ANESTHESIA  IMMEDIATELY FOLLOWING SURGERY:  Do not drive or operate machinery for the first twenty four hours after surgery.  Do not make any important decisions for twenty four hours after surgery or while taking narcotic pain medications or sedatives.  If you develop intractable nausea and vomiting or a severe headache please notify your doctor immediately.  FOLLOW-UP:  Please make an appointment with your surgeon as instructed. You do not need to follow up with anesthesia unless specifically instructed to do so.  WOUND CARE INSTRUCTIONS (if applicable):  Keep a dry clean dressing on the anesthesia/puncture wound site if there is drainage.  Once the wound has quit draining you may leave it open to air.  Generally you should leave the bandage intact for twenty four hours unless there is drainage.  If the epidural site drains for more than 36-48 hours please call the anesthesia department.  QUESTIONS?:  Please feel free to call your physician or the hospital operator if you have any questions, and they will be happy to assist you.

## 2017-01-11 ENCOUNTER — Encounter (HOSPITAL_COMMUNITY): Payer: Self-pay

## 2017-01-11 ENCOUNTER — Encounter (HOSPITAL_COMMUNITY)
Admission: RE | Admit: 2017-01-11 | Discharge: 2017-01-11 | Disposition: A | Discharge status: Home / Self Care | Payer: BC Managed Care – PPO | Source: Ambulatory Visit | Attending: Ophthalmology | Admitting: Ophthalmology

## 2017-01-11 DIAGNOSIS — Z01812 Encounter for preprocedural laboratory examination: Secondary | ICD-10-CM | POA: Diagnosis not present

## 2017-01-11 DIAGNOSIS — H2512 Age-related nuclear cataract, left eye: Secondary | ICD-10-CM | POA: Diagnosis not present

## 2017-01-11 LAB — CBC WITH DIFFERENTIAL/PLATELET
BASOS ABS: 0 10*3/uL (ref 0.0–0.1)
BASOS PCT: 0 %
Eosinophils Absolute: 0.3 10*3/uL (ref 0.0–0.7)
Eosinophils Relative: 4 %
HEMATOCRIT: 39.5 % (ref 36.0–46.0)
Hemoglobin: 12.8 g/dL (ref 12.0–15.0)
Lymphocytes Relative: 28 %
Lymphs Abs: 2.3 10*3/uL (ref 0.7–4.0)
MCH: 29.4 pg (ref 26.0–34.0)
MCHC: 32.4 g/dL (ref 30.0–36.0)
MCV: 90.6 fL (ref 78.0–100.0)
MONO ABS: 0.7 10*3/uL (ref 0.1–1.0)
Monocytes Relative: 8 %
NEUTROS ABS: 5 10*3/uL (ref 1.7–7.7)
NEUTROS PCT: 60 %
PLATELETS: 280 10*3/uL (ref 150–400)
RBC: 4.36 MIL/uL (ref 3.87–5.11)
RDW: 13.8 % (ref 11.5–15.5)
WBC: 8.3 10*3/uL (ref 4.0–10.5)

## 2017-01-11 LAB — BASIC METABOLIC PANEL
ANION GAP: 9 (ref 5–15)
BUN: 13 mg/dL (ref 6–20)
CO2: 27 mmol/L (ref 22–32)
Calcium: 9.3 mg/dL (ref 8.9–10.3)
Chloride: 104 mmol/L (ref 101–111)
Creatinine, Ser: 0.49 mg/dL (ref 0.44–1.00)
Glucose, Bld: 111 mg/dL — ABNORMAL HIGH (ref 65–99)
Potassium: 3.2 mmol/L — ABNORMAL LOW (ref 3.5–5.1)
Sodium: 140 mmol/L (ref 135–145)

## 2017-01-12 NOTE — Pre-Procedure Instructions (Signed)
Dr Patsey Berthold aware of potassium of 3.2. No orders given.

## 2017-01-16 IMAGING — NM NM MISC PROCEDURE
3 series · 18 of 18 positions shown · non-contrast
Comparison: none

[Series 1: stress-gsp_(id)_sa · 6.4mm · 6.40mm/px · 6 of 512 frames shown]
[frame 43/512]
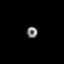
[frame 128/512]
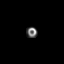
[frame 214/512]
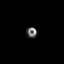
[frame 299/512]
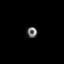
[frame 384/512]
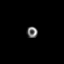
[frame 470/512]
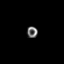

[Series 1: stress-sum-em_(id)_sa · 6.4mm · 6.40mm/px · 6 of 64 frames shown]
[frame 6/64]
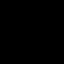
[frame 16/64]
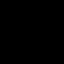
[frame 27/64]
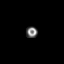
[frame 38/64]
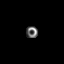
[frame 48/64]
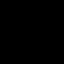
[frame 59/64]
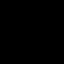

[Series 1: rest_(id)_sa · 6.4mm · 6.40mm/px · 6 of 64 frames shown]
[frame 6/64]
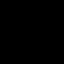
[frame 16/64]
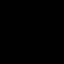
[frame 27/64]
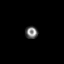
[frame 38/64]
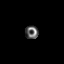
[frame 48/64]
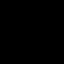
[frame 59/64]
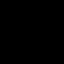

[18 of 18 positions shown; findings below may reference images not displayed]

Canned report from images found in remote index.

Refer to host system for actual result text.

## 2017-01-17 ENCOUNTER — Ambulatory Visit (HOSPITAL_COMMUNITY): Payer: BC Managed Care – PPO | Admitting: Anesthesiology

## 2017-01-17 ENCOUNTER — Encounter (HOSPITAL_COMMUNITY): Payer: Self-pay | Admitting: *Deleted

## 2017-01-17 ENCOUNTER — Ambulatory Visit (HOSPITAL_COMMUNITY)
Admission: RE | Admit: 2017-01-17 | Discharge: 2017-01-17 | Disposition: A | Discharge status: Home / Self Care | Payer: BC Managed Care – PPO | Source: Ambulatory Visit | Attending: Ophthalmology | Admitting: Ophthalmology

## 2017-01-17 ENCOUNTER — Encounter (HOSPITAL_COMMUNITY)
Admission: RE | Disposition: A | Discharge status: Home / Self Care | Payer: Self-pay | Source: Ambulatory Visit | Attending: Ophthalmology

## 2017-01-17 DIAGNOSIS — Z882 Allergy status to sulfonamides status: Secondary | ICD-10-CM | POA: Insufficient documentation

## 2017-01-17 DIAGNOSIS — F17200 Nicotine dependence, unspecified, uncomplicated: Secondary | ICD-10-CM | POA: Insufficient documentation

## 2017-01-17 DIAGNOSIS — I10 Essential (primary) hypertension: Secondary | ICD-10-CM | POA: Diagnosis not present

## 2017-01-17 DIAGNOSIS — R7303 Prediabetes: Secondary | ICD-10-CM | POA: Insufficient documentation

## 2017-01-17 DIAGNOSIS — H04123 Dry eye syndrome of bilateral lacrimal glands: Secondary | ICD-10-CM | POA: Insufficient documentation

## 2017-01-17 DIAGNOSIS — J449 Chronic obstructive pulmonary disease, unspecified: Secondary | ICD-10-CM | POA: Diagnosis not present

## 2017-01-17 DIAGNOSIS — K219 Gastro-esophageal reflux disease without esophagitis: Secondary | ICD-10-CM | POA: Insufficient documentation

## 2017-01-17 DIAGNOSIS — H2512 Age-related nuclear cataract, left eye: Secondary | ICD-10-CM | POA: Diagnosis not present

## 2017-01-17 DIAGNOSIS — Z79899 Other long term (current) drug therapy: Secondary | ICD-10-CM | POA: Diagnosis not present

## 2017-01-17 DIAGNOSIS — F419 Anxiety disorder, unspecified: Secondary | ICD-10-CM | POA: Diagnosis not present

## 2017-01-17 HISTORY — PX: CATARACT EXTRACTION W/PHACO: SHX586

## 2017-01-17 LAB — GLUCOSE, CAPILLARY: Glucose-Capillary: 86 mg/dL (ref 65–99)

## 2017-01-17 SURGERY — PHACOEMULSIFICATION, CATARACT, WITH IOL INSERTION
Anesthesia: Monitor Anesthesia Care | Site: Eye | Laterality: Left

## 2017-01-17 MED ORDER — PHENYLEPHRINE HCL 2.5 % OP SOLN
1.0000 [drp] | OPHTHALMIC | Status: AC
  Administered 2017-01-17 (×3): 1 [drp] via OPHTHALMIC

## 2017-01-17 MED ORDER — EPINEPHRINE PF 1 MG/ML IJ SOLN
INTRAOCULAR | Status: DC | PRN
  Administered 2017-01-17: 500 mL

## 2017-01-17 MED ORDER — FENTANYL CITRATE (PF) 100 MCG/2ML IJ SOLN
INTRAMUSCULAR | Status: AC
  Filled 2017-01-17: qty 2

## 2017-01-17 MED ORDER — MIDAZOLAM HCL 2 MG/2ML IJ SOLN
1.0000 mg | INTRAMUSCULAR | Status: AC
  Administered 2017-01-17: 2 mg via INTRAVENOUS

## 2017-01-17 MED ORDER — BSS IO SOLN
INTRAOCULAR | Status: DC | PRN
  Administered 2017-01-17: 15 mL via INTRAOCULAR

## 2017-01-17 MED ORDER — LIDOCAINE HCL (PF) 1 % IJ SOLN
INTRAMUSCULAR | Status: DC | PRN
  Administered 2017-01-17: .6 mL

## 2017-01-17 MED ORDER — NEOMYCIN-POLYMYXIN-DEXAMETH 3.5-10000-0.1 OP SUSP
OPHTHALMIC | Status: DC | PRN
  Administered 2017-01-17: 1 [drp] via OPHTHALMIC

## 2017-01-17 MED ORDER — LACTATED RINGERS IV SOLN
INTRAVENOUS | Status: DC
  Administered 2017-01-17: 08:00:00 via INTRAVENOUS

## 2017-01-17 MED ORDER — CYCLOPENTOLATE-PHENYLEPHRINE 0.2-1 % OP SOLN
1.0000 [drp] | OPHTHALMIC | Status: AC
Start: 2017-01-17 — End: 2017-01-17
  Administered 2017-01-17 (×3): 1 [drp] via OPHTHALMIC

## 2017-01-17 MED ORDER — PROVISC 10 MG/ML IO SOLN
INTRAOCULAR | Status: DC | PRN
  Administered 2017-01-17: 0.85 mL via INTRAOCULAR

## 2017-01-17 MED ORDER — MIDAZOLAM HCL 2 MG/2ML IJ SOLN
INTRAMUSCULAR | Status: AC
  Filled 2017-01-17: qty 2

## 2017-01-17 MED ORDER — POVIDONE-IODINE 5 % OP SOLN
OPHTHALMIC | Status: DC | PRN
  Administered 2017-01-17: 1 via OPHTHALMIC

## 2017-01-17 MED ORDER — LIDOCAINE HCL 3.5 % OP GEL
1.0000 "application " | Freq: Once | OPHTHALMIC | Status: AC
  Administered 2017-01-17: 1 via OPHTHALMIC

## 2017-01-17 MED ORDER — TETRACAINE HCL 0.5 % OP SOLN
1.0000 [drp] | OPHTHALMIC | Status: AC
  Administered 2017-01-17 (×3): 1 [drp] via OPHTHALMIC

## 2017-01-17 MED ORDER — FENTANYL CITRATE (PF) 100 MCG/2ML IJ SOLN
25.0000 ug | Freq: Once | INTRAMUSCULAR | Status: AC
  Administered 2017-01-17: 25 ug via INTRAVENOUS

## 2017-01-17 SURGICAL SUPPLY — 11 items
CLOTH BEACON ORANGE TIMEOUT ST (SAFETY) ×1 IMPLANT
EYE SHIELD UNIVERSAL CLEAR (GAUZE/BANDAGES/DRESSINGS) ×1 IMPLANT
GLOVE BIOGEL PI IND STRL 7.0 (GLOVE) IMPLANT
GLOVE BIOGEL PI INDICATOR 7.0 (GLOVE) ×2
LENS IOL SYMFONY 11.5 ×1 IMPLANT
PAD ARMBOARD 7.5X6 YLW CONV (MISCELLANEOUS) ×1 IMPLANT
PROC W SPEC LENS (INTRAOCULAR LENS) ×2
PROCESS W SPEC LENS (INTRAOCULAR LENS) IMPLANT
SYRINGE LUER LOK 1CC (MISCELLANEOUS) ×1 IMPLANT
TAPE TRANSPARENT 1/2IN (GAUZE/BANDAGES/DRESSINGS) ×1 IMPLANT
WATER STERILE IRR 250ML POUR (IV SOLUTION) ×1 IMPLANT

## 2017-01-17 NOTE — H&P (Signed)
I have reviewed the H&P, the patient was re-examined, and I have identified no interval changes in medical condition and plan of care since the history and physical of record  

## 2017-01-17 NOTE — Discharge Instructions (Signed)
Monitored Anesthesia Care, Care After  These instructions provide you with information about caring for yourself after your procedure. Your health care provider may also give you more specific instructions. Your treatment has been planned according to current medical practices, but problems sometimes occur. Call your health care provider if you have any problems or questions after your procedure.  What can I expect after the procedure?  After your procedure, it is common to:   Feel sleepy for several hours.   Feel clumsy and have poor balance for several hours.   Feel forgetful about what happened after the procedure.   Have poor judgment for several hours.   Feel nauseous or vomit.   Have a sore throat if you had a breathing tube during the procedure.    Follow these instructions at home:  For at least 24 hours after the procedure:     Do not:  ? Participate in activities in which you could fall or become injured.  ? Drive.  ? Use heavy machinery.  ? Drink alcohol.  ? Take sleeping pills or medicines that cause drowsiness.  ? Make important decisions or sign legal documents.  ? Take care of children on your own.   Rest.  Eating and drinking   Follow the diet that is recommended by your health care provider.   If you vomit, drink water, juice, or soup when you can drink without vomiting.   Make sure you have little or no nausea before eating solid foods.  General instructions   Have a responsible adult stay with you until you are awake and alert.   Take over-the-counter and prescription medicines only as told by your health care provider.   If you smoke, do not smoke without supervision.   Keep all follow-up visits as told by your health care provider. This is important.  Contact a health care provider if:   You keep feeling nauseous or you keep vomiting.   You feel light-headed.   You develop a rash.   You have a fever.  Get help right away if:   You have trouble breathing.  This information is  not intended to replace advice given to you by your health care provider. Make sure you discuss any questions you have with your health care provider.  Document Released: 11/16/2015 Document Revised: 03/17/2016 Document Reviewed: 11/16/2015  Elsevier Interactive Patient Education  2018 [employer]

## 2017-01-17 NOTE — Anesthesia Procedure Notes (Signed)
Procedure Name: MAC Date/Time: 01/17/2017 8:44 AM Performed by: Vista Deck Pre-anesthesia Checklist: Patient identified, Emergency Drugs available, Suction available, Timeout performed and Patient being monitored Patient Re-evaluated:Patient Re-evaluated prior to inductionOxygen Delivery Method: Nasal Cannula

## 2017-01-17 NOTE — Op Note (Signed)
Date of Admission: 01/17/2017  Date of Surgery: 01/17/2017  Pre-Op Dx: Cataract Left  Eye  Post-Op Dx: Senile Nuclear Cataract  Left  Eye,  Dx Code H25.12  Surgeon: Tonny Branch, M.D.  Assistants: None  Anesthesia: Topical with MAC  Indications: Painless, progressive loss of vision with compromise of daily activities.  Surgery: Cataract Extraction with Intraocular lens Implant Left Eye  Discription: The patient had dilating drops and viscous lidocaine placed into the Left eye in the pre-op holding area. After transfer to the operating room, a time out was performed. The patient was then prepped and draped. Beginning with a 13m blade a paracentesis port was made at the surgeon's 2 o'clock position. The anterior chamber was then filled with 1% non-preserved lidocaine. This was followed by filling the anterior chamber with Provisc.  A 2.435mkeratome blade was used to make a clear corneal incision at the temporal limbus.  A bent cystatome needle was used to create a continuous tear capsulotomy. Hydrodissection was performed with balanced salt solution on a Fine canula. The lens nucleus was then removed using the phacoemulsification handpiece. Residual cortex was removed with the I&A handpiece. The anterior chamber and capsular bag were refilled with Provisc. A posterior chamber intraocular lens was placed into the capsular bag with it's injector. The implant was positioned with the Kuglan hook. The Provisc was then removed from the anterior chamber and capsular bag with the I&A handpiece. Stromal hydration of the main incision and paracentesis port was performed with BSS on a Fine canula. The wounds were tested for leak which was negative. The patient tolerated the procedure well. There were no operative complications. The patient was then transferred to the recovery room in stable condition.  Complications: None  Specimen: None  EBL: None  Prosthetic device: J&J Symfony, [id number], power 22.0, SN  [id number]

## 2017-01-17 NOTE — Anesthesia Preprocedure Evaluation (Signed)
Anesthesia Evaluation  Patient identified by MRN, date of birth, ID band Patient awake    Reviewed: Allergy & Precautions, NPO status , Patient's Chart, lab work & pertinent test results  Airway Mallampati: II  TM Distance: >3 FB     Dental  (+) Teeth Intact   Pulmonary COPD, Current Smoker,    breath sounds clear to auscultation       Cardiovascular + dysrhythmias (tachycardia on beta blockers)  Rhythm:Regular Rate:Normal     Neuro/Psych PSYCHIATRIC DISORDERS Anxiety    GI/Hepatic GERD  Medicated and Controlled,  Endo/Other  diabetes (borderline), Type 2  Renal/GU      Musculoskeletal   Abdominal   Peds  Hematology   Anesthesia Other Findings   Reproductive/Obstetrics                             Anesthesia Physical Anesthesia Plan  ASA: III  Anesthesia Plan: MAC   Post-op Pain Management:    Induction: Intravenous  PONV Risk Score and Plan:   Airway Management Planned: Nasal Cannula  Additional Equipment:   Intra-op Plan:   Post-operative Plan:   Informed Consent: I have reviewed the patients History and Physical, chart, labs and discussed the procedure including the risks, benefits and alternatives for the proposed anesthesia with the patient or authorized representative who has indicated his/her understanding and acceptance.     Plan Discussed with:   Anesthesia Plan Comments:         Anesthesia Quick Evaluation

## 2017-01-17 NOTE — Transfer of Care (Signed)
Immediate Anesthesia Transfer of Care Note  Patient: Isabel Atkinson  Procedure(s) Performed: Procedure(s) (LRB): CATARACT EXTRACTION PHACO AND INTRAOCULAR LENS PLACEMENT (IOC) (Left)  Patient Location: Shortstay  Anesthesia Type: MAC  Level of Consciousness: awake  Airway & Oxygen Therapy: Patient Spontanous Breathing   Post-op Assessment: Report given to PACU RN, Post -op Vital signs reviewed and stable and Patient moving all extremities  Post vital signs: Reviewed and stable  Complications: No apparent anesthesia complications

## 2017-01-17 NOTE — Anesthesia Postprocedure Evaluation (Signed)
Anesthesia Post Note  Patient: ARYELLA BESECKER  Procedure(s) Performed: Procedure(s) (LRB): CATARACT EXTRACTION PHACO AND INTRAOCULAR LENS PLACEMENT (IOC) (Left)  Patient location during evaluation: Short Stay Anesthesia Type: MAC Level of consciousness: awake and alert Pain management: satisfactory to patient Vital Signs Assessment: post-procedure vital signs reviewed and stable Respiratory status: spontaneous breathing Cardiovascular status: stable Postop Assessment: no signs of nausea or vomiting Anesthetic complications: no     Last Vitals:  Vitals:   01/17/17 0820 01/17/17 0830  BP: 122/76 124/69  Resp: (!) 21 19  Temp:      Last Pain:  Vitals:   01/17/17 0744  TempSrc: Oral                 Eshal Propps

## 2017-01-19 ENCOUNTER — Encounter (HOSPITAL_COMMUNITY): Payer: Self-pay | Admitting: Ophthalmology

## 2017-01-25 ENCOUNTER — Ambulatory Visit (INDEPENDENT_AMBULATORY_CARE_PROVIDER_SITE_OTHER): Payer: BC Managed Care – PPO | Admitting: Family Medicine

## 2017-01-25 ENCOUNTER — Encounter: Payer: Self-pay | Admitting: Family Medicine

## 2017-01-25 VITALS — BP 100/62 | Temp 98.9°F | Ht 62.0 in | Wt 98.4 lb

## 2017-01-25 DIAGNOSIS — J329 Chronic sinusitis, unspecified: Secondary | ICD-10-CM

## 2017-01-25 DIAGNOSIS — J208 Acute bronchitis due to other specified organisms: Secondary | ICD-10-CM | POA: Diagnosis not present

## 2017-01-25 DIAGNOSIS — J310 Chronic rhinitis: Secondary | ICD-10-CM

## 2017-01-25 MED ORDER — LEVOFLOXACIN 500 MG PO TABS: 500.0000 mg | ORAL_TABLET | Freq: Every day | ORAL | 0 refills | Status: DC

## 2017-01-25 MED ORDER — CEFTRIAXONE SODIUM 1 G IJ SOLR
500.0000 mg | Freq: Once | INTRAMUSCULAR | Status: AC
  Administered 2017-01-25: 500 mg via INTRAMUSCULAR

## 2017-01-25 NOTE — Progress Notes (Signed)
   Subjective:    Patient ID: Isabel Atkinson, female    DOB: Nov 17, 1965, 51 y.o.   MRN: [id number]  Cough  This is a new problem. The current episode started in the past 7 days. Associated symptoms include a fever, nasal congestion and postnasal drip.  Viral like illness for multiple days head congestion drainage coughing now with sinus pressure pain and chest congestion not feeling good denies high fever chills sweats. Does smoke she know she needs to quit    Review of Systems  Constitutional: Positive for fever.  HENT: Positive for postnasal drip.   Respiratory: Positive for cough.        Objective:   Physical Exam  HEENT benign neck no masses lungs there is some crackles in the left base no respiratory distress heart regular      Assessment & Plan:  Sinusitis Bronchitis Possible early pneumonia If worse over the next several days call back may need to do chest x-ray lab work Rocephin shot today Antibiotic prescribed warning signs discussed Patient encouraged to quit smoking.

## 2017-01-28 ENCOUNTER — Encounter: Payer: Self-pay | Admitting: Nurse Practitioner

## 2017-01-28 ENCOUNTER — Ambulatory Visit (INDEPENDENT_AMBULATORY_CARE_PROVIDER_SITE_OTHER): Payer: BC Managed Care – PPO | Admitting: Nurse Practitioner

## 2017-01-28 VITALS — BP 126/78 | Temp 99.3°F | Ht 62.0 in | Wt 98.0 lb

## 2017-01-28 DIAGNOSIS — J329 Chronic sinusitis, unspecified: Secondary | ICD-10-CM | POA: Diagnosis not present

## 2017-01-28 DIAGNOSIS — J441 Chronic obstructive pulmonary disease with (acute) exacerbation: Secondary | ICD-10-CM

## 2017-01-28 DIAGNOSIS — J310 Chronic rhinitis: Secondary | ICD-10-CM

## 2017-01-28 MED ORDER — HYDROCODONE-HOMATROPINE 5-1.5 MG/5ML PO SYRP: 5.0000 mL | ORAL_SOLUTION | ORAL | 0 refills | Status: DC | PRN

## 2017-01-28 MED ORDER — PREDNISONE 20 MG PO TABS: ORAL_TABLET | ORAL | 0 refills | Status: DC

## 2017-01-28 MED ORDER — ALBUTEROL SULFATE HFA 108 (90 BASE) MCG/ACT IN AERS: 2.0000 | INHALATION_SPRAY | RESPIRATORY_TRACT | 0 refills | Status: DC | PRN

## 2017-01-29 ENCOUNTER — Encounter: Payer: Self-pay | Admitting: Nurse Practitioner

## 2017-01-29 NOTE — Progress Notes (Signed)
Subjective:  Presents for recheck. See note 6/19. No fever, sore throat or ear pain. Headache. Head congestion. Minimal change in cough. Wheezing slightly better. Does not have an inhaler. Mucus still yellow at times. No vomiting, diarrhea or abd pain.   Objective:   BP 126/78   Temp 99.3 F (37.4 C) (Oral)   Ht 5\' 2"  (1.575 m)   Wt 98 lb 0.4 oz (44.5 kg)   BMI 17.93 kg/m  NAD. Alert, oriented. TMs clear effusion. Pharynx clear. Neck supple with mild anterior adenopathy. Lungs clear. No wheezing or tachypnea. O2 sat 96%. Heart RRR. Occasional congested cough noted.   Assessment:   Problem List Items Addressed This Visit      Respiratory   COPD (chronic obstructive pulmonary disease) ([place])   Relevant Medications   predniSONE (DELTASONE) 20 MG tablet   albuterol (PROVENTIL HFA;VENTOLIN HFA) 108 (90 Base) MCG/ACT inhaler   HYDROcodone-homatropine (HYCODAN) 5-1.5 MG/5ML syrup    Other Visit Diagnoses    Rhinosinusitis    -  Primary   Relevant Medications   predniSONE (DELTASONE) 20 MG tablet   HYDROcodone-homatropine (HYCODAN) 5-1.5 MG/5ML syrup       Plan:   Meds ordered this encounter  Medications  . Bromfenac Sodium (PROLENSA) 0.07 % SOLN    Sig: Apply 1 drop to eye daily.  . Difluprednate (DUREZOL) 0.05 % EMUL    Sig: Apply 3 drops to eye 3 (three) times daily.  . predniSONE (DELTASONE) 20 MG tablet    Sig: 2 po qd x 5 d    Dispense:  10 tablet    Refill:  0    Order Specific Question:   Supervising Provider    Answer:   Mikey Kirschner [[id number]]  . albuterol (PROVENTIL HFA;VENTOLIN HFA) 108 (90 Base) MCG/ACT inhaler    Sig: Inhale 2 puffs into the lungs every 4 (four) hours as needed.    Dispense:  1 Inhaler    Refill:  0    Order Specific Question:   Supervising Provider    Answer:   Mikey Kirschner [[id number]]  . HYDROcodone-homatropine (HYCODAN) 5-1.5 MG/5ML syrup    Sig: Take 5 mLs by mouth every 4 (four) hours as needed.    Dispense:  120 mL    Refill:  0   Order Specific Question:   Supervising Provider    Answer:   Mikey Kirschner [[id number]]   Warning signs reviewed. Call back next week if no improvement, sooner if worse. Lengthy discussion regarding smoking cessation. Return if symptoms worsen or fail to improve.

## 2017-02-01 ENCOUNTER — Telehealth: Payer: Self-pay | Admitting: Family Medicine

## 2017-02-01 ENCOUNTER — Other Ambulatory Visit (HOSPITAL_COMMUNITY)
Admission: RE | Admit: 2017-02-01 | Discharge: 2017-02-01 | Disposition: A | Discharge status: Home / Self Care | Payer: BC Managed Care – PPO | Source: Ambulatory Visit | Attending: Family Medicine | Admitting: Family Medicine

## 2017-02-01 ENCOUNTER — Ambulatory Visit (HOSPITAL_COMMUNITY)
Admission: RE | Admit: 2017-02-01 | Discharge: 2017-02-01 | Disposition: A | Discharge status: Home / Self Care | Payer: BC Managed Care – PPO | Source: Ambulatory Visit | Attending: Family Medicine | Admitting: Family Medicine

## 2017-02-01 DIAGNOSIS — J449 Chronic obstructive pulmonary disease, unspecified: Secondary | ICD-10-CM | POA: Diagnosis not present

## 2017-02-01 DIAGNOSIS — R05 Cough: Secondary | ICD-10-CM | POA: Diagnosis present

## 2017-02-01 DIAGNOSIS — R059 Cough, unspecified: Secondary | ICD-10-CM

## 2017-02-01 LAB — CBC WITH DIFFERENTIAL/PLATELET
BASOS PCT: 0 %
Basophils Absolute: 0 10*3/uL (ref 0.0–0.1)
EOS ABS: 0 10*3/uL (ref 0.0–0.7)
EOS PCT: 0 %
HCT: 40 % (ref 36.0–46.0)
HEMOGLOBIN: 13.3 g/dL (ref 12.0–15.0)
LYMPHS ABS: 0.8 10*3/uL (ref 0.7–4.0)
Lymphocytes Relative: 7 %
MCH: 29.8 pg (ref 26.0–34.0)
MCHC: 33.3 g/dL (ref 30.0–36.0)
MCV: 89.5 fL (ref 78.0–100.0)
MONO ABS: 0.2 10*3/uL (ref 0.1–1.0)
MONOS PCT: 1 %
NEUTROS PCT: 92 %
Neutro Abs: 11 10*3/uL — ABNORMAL HIGH (ref 1.7–7.7)
PLATELETS: 298 10*3/uL (ref 150–400)
RBC: 4.47 MIL/uL (ref 3.87–5.11)
RDW: 13.5 % (ref 11.5–15.5)
WBC: 12 10*3/uL — ABNORMAL HIGH (ref 4.0–10.5)

## 2017-02-01 NOTE — Telephone Encounter (Signed)
I would recommend ordering CBC and chest x-ray this can be done through the hospital-follow-up if ongoing symptoms toward the end of the week or if tests are abnormal once we get results of tests we will notify the patient

## 2017-02-01 NOTE — Telephone Encounter (Signed)
Patient stated that she is still having some wheezing, and productive coughing. Last had a low grade fever of 99.3 on Sunday. Denies shortness of breath. Would you like for her to be seen or for [place] just to order chest xray and lab work. Please advise?

## 2017-02-01 NOTE — Telephone Encounter (Signed)
Patient was seen by Dr. Nicki Reaper and [name] last week for rhino sinusitis.  She said she is not doing a whole lot better and would like to see if Dr. Nicki Reaper still wants her to do chest x-ray and blood work.

## 2017-02-01 NOTE — Telephone Encounter (Signed)
Nurse's-please be with patient find out is she having ongoing wheezing, coughing, fevers, shortness of breath? I believe the patient would benefit from doing CBC as well as chest x-ray-depending on symptomatology may need to be seen

## 2017-02-01 NOTE — Telephone Encounter (Signed)
Spoke with patient and informed her per Dr.Scott Luking- Dr.Scott  would recommend ordering CBC and chest x-ray this can be done through the hospital-follow-up if ongoing symptoms toward the end of the week or if tests are abnormal once we get results of tests we will notify you once results are in. Patient verbalized understanding.

## 2017-02-04 ENCOUNTER — Other Ambulatory Visit: Payer: Self-pay | Admitting: *Deleted

## 2017-02-04 MED ORDER — CEFPROZIL 500 MG PO TABS: 500.0000 mg | ORAL_TABLET | Freq: Two times a day (BID) | ORAL | 0 refills | Status: DC

## 2017-03-15 ENCOUNTER — Encounter: Payer: Self-pay | Admitting: Family Medicine

## 2017-03-15 ENCOUNTER — Ambulatory Visit (INDEPENDENT_AMBULATORY_CARE_PROVIDER_SITE_OTHER): Payer: BC Managed Care – PPO | Admitting: Family Medicine

## 2017-03-15 VITALS — Temp 98.4°F | Ht 62.0 in | Wt 99.6 lb

## 2017-03-15 DIAGNOSIS — N23 Unspecified renal colic: Secondary | ICD-10-CM | POA: Diagnosis not present

## 2017-03-15 NOTE — Progress Notes (Signed)
   Subjective:    Patient ID: Isabel Atkinson, female    DOB: 1966-07-01, 51 y.o.   MRN: [id number]  HPI  Patient arrives with c/o urinary symptoms. Patient with pressure in the left flank region radiates into the left lower abdomen she denies vaginal discharge denies bleeding denies hematuria denies rectal bleeding PMH benign has a history kidney stones Review of Systems Denies urinary from C dysuria    Objective:   Physical Exam  Lungs clear hearts regular abdomen soft no guarding rebound or tenderness she does have subjective discomfort in the left flank      Assessment & Plan:  History kidney stone Renal colic Pain medicine when necessary has some at home already If not dramatically better within 48 hours then CT scan for renal colic Lab work ordered Warning signs discussed follow-up if problems

## 2017-03-16 LAB — BASIC METABOLIC PANEL
BUN/Creatinine Ratio: 25 — ABNORMAL HIGH (ref 9–23)
BUN: 13 mg/dL (ref 6–24)
CALCIUM: 9.6 mg/dL (ref 8.7–10.2)
CHLORIDE: 105 mmol/L (ref 96–106)
CO2: 24 mmol/L (ref 20–29)
Creatinine, Ser: 0.52 mg/dL — ABNORMAL LOW (ref 0.57–1.00)
GFR calc Af Amer: 127 mL/min/{1.73_m2} (ref >59)
GFR, EST NON AFRICAN AMERICAN: 110 mL/min/{1.73_m2} (ref >59)
Glucose: 85 mg/dL (ref 65–99)
POTASSIUM: 3.9 mmol/L (ref 3.5–5.2)
SODIUM: 143 mmol/L (ref 134–144)

## 2017-03-16 LAB — CBC WITH DIFFERENTIAL/PLATELET
BASOS: 1 %
Basophils Absolute: 0 10*3/uL (ref 0.0–0.2)
EOS (ABSOLUTE): 0.4 10*3/uL (ref 0.0–0.4)
Eos: 5 %
Hematocrit: 38.5 % (ref 34.0–46.6)
Hemoglobin: 12.6 g/dL (ref 11.1–15.9)
Immature Grans (Abs): 0 10*3/uL (ref 0.0–0.1)
Immature Granulocytes: 0 %
LYMPHS ABS: 3.3 10*3/uL — AB (ref 0.7–3.1)
Lymphs: 41 %
MCH: 28.6 pg (ref 26.6–33.0)
MCHC: 32.7 g/dL (ref 31.5–35.7)
MCV: 88 fL (ref 79–97)
MONOS ABS: 0.6 10*3/uL (ref 0.1–0.9)
Monocytes: 7 %
NEUTROS ABS: 3.8 10*3/uL (ref 1.4–7.0)
Neutrophils: 46 %
PLATELETS: 280 10*3/uL (ref 150–379)
RBC: 4.4 x10E6/uL (ref 3.77–5.28)
RDW: 14 % (ref 12.3–15.4)
WBC: 8.1 10*3/uL (ref 3.4–10.8)

## 2017-03-21 ENCOUNTER — Telehealth: Payer: Self-pay | Admitting: Family Medicine

## 2017-03-21 DIAGNOSIS — R319 Hematuria, unspecified: Secondary | ICD-10-CM

## 2017-03-21 DIAGNOSIS — N23 Unspecified renal colic: Secondary | ICD-10-CM

## 2017-03-21 NOTE — Telephone Encounter (Signed)
Await the results

## 2017-03-21 NOTE — Telephone Encounter (Signed)
It would be best to go ahead and set her up for CT scan kidney stone protocol for flank pain and hematuria this needs to be done relatively quickly such as today or tomorrow-obviously the pain patient is in severe severe pain may need to go to ER

## 2017-03-21 NOTE — Telephone Encounter (Signed)
Spoke with patient and informed her that appointment for CT is schedule for tomorrow 03/22/17 7:00 PM at [facility]. Patient verbalized understanding.

## 2017-03-21 NOTE — Telephone Encounter (Signed)
Patient wanted you to know still hasnt passed kidney stone.She wanted to know if you still were going to send her ultra sound or xray.

## 2017-03-21 NOTE — Telephone Encounter (Signed)
Left message to return call 

## 2017-03-21 NOTE — Telephone Encounter (Signed)
Spoke with patient and informed her that Dr.Scott would like to order a CT scan with kidney stone protocol for today or tomorrow. Patient stated that she could go tomorrow. Left message return call to tell patient CT is schedule for tomorrow 03/22/17 at 7:00 pm at [facility].

## 2017-03-22 ENCOUNTER — Ambulatory Visit (HOSPITAL_COMMUNITY): Admission: RE | Admit: 2017-03-22 | Payer: BC Managed Care – PPO | Source: Ambulatory Visit

## 2017-03-24 ENCOUNTER — Other Ambulatory Visit: Payer: Self-pay | Admitting: *Deleted

## 2017-03-24 ENCOUNTER — Telehealth: Payer: Self-pay | Admitting: *Deleted

## 2017-03-24 NOTE — Telephone Encounter (Signed)
[place], please let me speak with the Peer to peer

## 2017-03-24 NOTE — Telephone Encounter (Signed)
Please let the patient know that her insurance company denied her having CT scan. I would recommend a KUB x-ray to see if it might show the stone. Also if the patient is still having discomfort or signs of blood in her urine it would be best for [place] to see her tomorrow to recheck the urine. If the recheck of urine still shows blood and she still has symptoms and the KUB does not show the stone then we should be able to get CT scan approved (although it will be necessary to cancel the CT scan for this week because if we do get it approved it won't be approved until next week)

## 2017-03-24 NOTE — Telephone Encounter (Signed)
Left message to return call 

## 2017-03-24 NOTE — Telephone Encounter (Signed)
CT kidney stone protocol scheduled for 03/25/17 was denied by insurance- see letter in yellow folder

## 2017-03-25 ENCOUNTER — Ambulatory Visit (HOSPITAL_COMMUNITY): Payer: BC Managed Care – PPO

## 2017-03-25 ENCOUNTER — Ambulatory Visit (INDEPENDENT_AMBULATORY_CARE_PROVIDER_SITE_OTHER): Payer: BC Managed Care – PPO | Admitting: Family Medicine

## 2017-03-25 ENCOUNTER — Ambulatory Visit (HOSPITAL_COMMUNITY)
Admission: RE | Admit: 2017-03-25 | Discharge: 2017-03-25 | Disposition: A | Discharge status: Home / Self Care | Payer: BC Managed Care – PPO | Source: Ambulatory Visit | Attending: Family Medicine | Admitting: Family Medicine

## 2017-03-25 ENCOUNTER — Encounter: Payer: Self-pay | Admitting: Family Medicine

## 2017-03-25 VITALS — Temp 98.6°F | Ht 62.0 in | Wt 99.4 lb

## 2017-03-25 DIAGNOSIS — N23 Unspecified renal colic: Secondary | ICD-10-CM | POA: Diagnosis not present

## 2017-03-25 DIAGNOSIS — R319 Hematuria, unspecified: Secondary | ICD-10-CM | POA: Insufficient documentation

## 2017-03-25 DIAGNOSIS — R300 Dysuria: Secondary | ICD-10-CM

## 2017-03-25 DIAGNOSIS — N200 Calculus of kidney: Secondary | ICD-10-CM | POA: Insufficient documentation

## 2017-03-25 LAB — POCT URINALYSIS DIPSTICK: pH, UA: 5 (ref 5.0–8.0)

## 2017-03-25 NOTE — Progress Notes (Signed)
   Subjective:    Patient ID: Isabel Atkinson, female    DOB: [birth year], 51 y.o.   MRN: [id number]  HPIpain on left side and pain with urination. Started 2 -3 weeks.  This patient comes in with left side pain it starts in the left renal area radiates around the side into the lower abdomen this is been going on for the past couple weeks she is been seen earlier diagnosed with renal colic and hematuria we tried conservative measures including medication anti-inflammatories plenty of liquids she is been unable to resolve the situation she has a history of kidney stones she denies any fever vomiting diarrhea sweats or chills. Despite our conservative measures patient was unable to clear her illness/stone. CAT scan was recommended but it was unable to be covered because of insurance guidelines.  The patient is seen today because she is having ongoing pain and discomfort she describes as severe at times has to get up and move around to get comfortable   Review of Systems    see above denies cough wheezing vomiting diarrhea relates abdominal pain and flank pain consistent with what is stated above IMA denies high fever Objective:   Physical Exam Lungs are clear hearts regular abdomen left side flank pain to percussion left side abdominal pain no guarding or rebound urinalysis small amount RBCs 2-4 per high Power field       Assessment & Plan:  Renal colic Each ear be ordered Impression encourage patient to drink plenty of liquids If KUB does not indicate a stone within the ureter she will need to have a CT scan  Addendum-the KUB did not show any stone in the ureter. It is necessary for this patient to go ahead with CT scan renal protocol to find out the area of her stone. If her insurance company blocks this again the next step would be referral to urology the patient understands

## 2017-03-25 NOTE — Telephone Encounter (Signed)
Patient is aware of all, states she will not be able to complete the Kub until this evening, states she is watching her grandson.She is still experiencing the pain and not seeing any blood in urine.She states the grandson has an appt with you today at 3 pm. I asked if she could be seen today she says yes,but she will not be able to have the Kub done prior to the appt,she could go after the appt.

## 2017-03-25 NOTE — Addendum Note (Signed)
Addended by: Dairl Ponder on: 03/25/2017 08:48 AM   Modules accepted: Orders

## 2017-03-27 LAB — URINE CULTURE

## 2017-03-28 ENCOUNTER — Telehealth: Payer: Self-pay | Admitting: *Deleted

## 2017-03-28 DIAGNOSIS — N23 Unspecified renal colic: Secondary | ICD-10-CM

## 2017-03-28 NOTE — Telephone Encounter (Signed)
Ct renal stone study denied because insurance wants ultrasound first. See form in your folder.

## 2017-03-28 NOTE — Addendum Note (Signed)
Addended by: Ofilia Neas R on: 03/28/2017 10:31 AM   Modules accepted: Orders

## 2017-03-28 NOTE — Telephone Encounter (Signed)
Left message return call 03/28/2017

## 2017-03-28 NOTE — Telephone Encounter (Signed)
Unfortunately we are at the mercy of a unreasonable system. It would be fine to do the ultrasound needs to be done within the next 24 hours

## 2017-03-29 ENCOUNTER — Ambulatory Visit (HOSPITAL_COMMUNITY)
Admission: RE | Admit: 2017-03-29 | Discharge: 2017-03-29 | Disposition: A | Discharge status: Home / Self Care | Payer: BC Managed Care – PPO | Source: Ambulatory Visit | Attending: Family Medicine | Admitting: Family Medicine

## 2017-03-29 DIAGNOSIS — N23 Unspecified renal colic: Secondary | ICD-10-CM | POA: Diagnosis not present

## 2017-03-29 NOTE — Telephone Encounter (Signed)
Left message return call 03/29/17  

## 2017-03-29 NOTE — Telephone Encounter (Signed)
Spoke with patient. Ultrasound scheduled for 2:30 pm today. Patient verbalized understanding.

## 2017-04-01 ENCOUNTER — Ambulatory Visit (HOSPITAL_COMMUNITY): Payer: BC Managed Care – PPO

## 2017-04-01 ENCOUNTER — Telehealth: Payer: Self-pay | Admitting: Family Medicine

## 2017-04-01 NOTE — Telephone Encounter (Signed)
Spoke with patient and informed her per Dr.Scott Luking- Informed the patient that we are trying to get her CT scan approved. So far the insurance company will not let us schedule. We are placing a phone call to them. If her insurance company denies it this time then we will set her up with specialist then they will be able to handle this. Patient verbalized understanding

## 2017-04-01 NOTE — Telephone Encounter (Signed)
Informed the patient that we are trying to get her CT scan approved. So far the insurance company will not let us schedule. We are placing a phone call to them. If her insurance company denies it this time then we will set her up with specialist then they will be able to handle this

## 2017-04-01 NOTE — Telephone Encounter (Signed)
Spoke with patient and informed her that we tried to reach her the other day to give her the renal ultrasound results to inform her that the renal ultrasound (see result note) was negative and that we wanted to find out if she has any pain still. Patient verbalized understanding and stated that she has some pain still just a very dull pain to side but not as bad. Please advise?

## 2017-04-01 NOTE — Telephone Encounter (Signed)
Patient wanted to let Dr. Nicki Reaper know that Isabel Atkinson just called her and cancelled her CT again.

## 2017-04-01 NOTE — Telephone Encounter (Signed)
Please see ultrasound result no. I recommend CT scan renal stone protocol if it is denied this time then I recommend referral to urology

## 2017-04-01 NOTE — Telephone Encounter (Signed)
Worked on prior auth for CT. CT denied via insurance. Awaiting fax from insurance company.

## 2017-04-06 ENCOUNTER — Other Ambulatory Visit: Payer: Self-pay | Admitting: *Deleted

## 2017-04-06 DIAGNOSIS — R109 Unspecified abdominal pain: Secondary | ICD-10-CM

## 2017-04-08 ENCOUNTER — Telehealth: Payer: Self-pay | Admitting: Family Medicine

## 2017-04-08 DIAGNOSIS — N200 Calculus of kidney: Secondary | ICD-10-CM

## 2017-04-08 NOTE — Telephone Encounter (Signed)
Patient CT renal protocol was denied again. Does not meet medical necessity due to ultrasound being normal as well as xray. Please advise?

## 2017-04-12 NOTE — Telephone Encounter (Signed)
We have done everything that we possibly can help evaluate this person's issue. If she is still having flank pain and discomfort I would recommend urology consultation-I believe she is seen urology in the past

## 2017-04-12 NOTE — Addendum Note (Signed)
Addended by: Carmelina Noun on: 04/12/2017 08:25 AM   Modules accepted: Orders

## 2017-04-12 NOTE — Telephone Encounter (Signed)
Order for referral put in. Left message to return call to let pt know scan was denied by insurance and referral put in.

## 2017-04-15 ENCOUNTER — Ambulatory Visit (HOSPITAL_COMMUNITY): Payer: BC Managed Care – PPO

## 2017-04-20 ENCOUNTER — Encounter: Payer: Self-pay | Admitting: Internal Medicine

## 2017-04-28 ENCOUNTER — Ambulatory Visit (INDEPENDENT_AMBULATORY_CARE_PROVIDER_SITE_OTHER): Payer: BC Managed Care – PPO | Admitting: Family Medicine

## 2017-04-28 ENCOUNTER — Encounter: Payer: Self-pay | Admitting: Family Medicine

## 2017-04-28 VITALS — BP 100/68 | Temp 98.8°F | Ht 62.0 in | Wt 99.8 lb

## 2017-04-28 DIAGNOSIS — J329 Chronic sinusitis, unspecified: Secondary | ICD-10-CM | POA: Diagnosis not present

## 2017-04-28 DIAGNOSIS — J310 Chronic rhinitis: Secondary | ICD-10-CM

## 2017-04-28 MED ORDER — DOXYCYCLINE HYCLATE 100 MG PO TABS: 100.0000 mg | ORAL_TABLET | Freq: Two times a day (BID) | ORAL | 0 refills | Status: DC

## 2017-04-28 NOTE — Progress Notes (Signed)
   Subjective:    Patient ID: Isabel Atkinson, female    DOB: 01-Mar-1966, 51 y.o.   MRN: [id number]  Cough  This is a new problem. The current episode started 1 to 4 weeks ago. Associated symptoms include a fever, headaches and nasal congestion.   Two weks duration  Cong and dranage and runny nose and cough anc chills  Fever overall low grade  Some cough and cong and gunkiness Yellow disch   Front an max head ache   Using without blod presure friendly meds     Review of Systems  Constitutional: Positive for fever.  Respiratory: Positive for cough.   Neurological: Positive for headaches.       Objective:   Physical Exam Alert, mild malaise. Hydration good Vitals stable. frontal/ maxillary tenderness evident positive nasal congestion. pharynx normal neck supple  lungs clear/no crackles or wheezes. heart regular in rhythm        Assessment & Plan:  Impression rhinosinusitis/Bronchitis with element of reactive airway likely post viral, discussed with patient. plan antibiotics prescribed. Questions answered. Symptomatic care discussed. warning signs discussed. WSL

## 2017-05-04 ENCOUNTER — Encounter: Payer: Self-pay | Admitting: Family Medicine

## 2017-05-04 ENCOUNTER — Ambulatory Visit (INDEPENDENT_AMBULATORY_CARE_PROVIDER_SITE_OTHER): Payer: BC Managed Care – PPO | Admitting: Family Medicine

## 2017-05-04 DIAGNOSIS — R300 Dysuria: Secondary | ICD-10-CM | POA: Diagnosis not present

## 2017-05-04 LAB — POCT URINALYSIS DIPSTICK
PH UA: 5 (ref 5.0–8.0)
Spec Grav, UA: >=1.03 — AB (ref 1.010–1.025)

## 2017-05-04 NOTE — Progress Notes (Signed)
   Subjective:    Patient ID: Isabel Atkinson, female    DOB: [birth year], 51 y.o.   MRN: [id number]  Urinary Tract Infection   This is a new problem. The current episode started in the past 7 days.   Results for orders placed or performed in visit on 05/04/17  POCT urinalysis dipstick  Result Value Ref Range   Color, UA     Clarity, UA     Glucose, UA     Bilirubin, UA ++    Ketones, UA     Spec Grav, UA >=1.030 (A) 1.010 - 1.025   Blood, UA     pH, UA 5.0 5.0 - 8.0   Protein, UA     Urobilinogen, UA  0.2 or 1.0 E.U./dL   Nitrite, UA     Leukocytes, UA  Negative   Over the past weekend started having urination and tend   No trouble yest and vom times one  No diarrhea   apptite ok, felt a little nauseated   No fever or chills      Review of Systems No headache, no major weight loss or weight gain, no chest pain no back pain abdominal pain no change in bowel habits complete ROS otherwise negative     Objective:   Physical Exam Alert vitals stable, NAD. Blood pressure good on repeat. HEENT normal. Lungs clear. Heart regular rate and rhythm. No CVA tenderness abdomen nonspecific  Urinalysis not impressive       Assessment & Plan:  Impression dysuria. Etiology not clear. Could be medicine reaction. Doubt infection. Patient already on antibiotics. Discussed. Will culture urine.  Addendum cultures returned negative

## 2017-05-06 LAB — URINE CULTURE: Organism ID, Bacteria: NO GROWTH

## 2017-05-18 ENCOUNTER — Ambulatory Visit: Payer: BC Managed Care – PPO | Admitting: Neurology

## 2017-05-23 ENCOUNTER — Encounter: Payer: Self-pay | Admitting: Family Medicine

## 2017-05-23 ENCOUNTER — Ambulatory Visit (INDEPENDENT_AMBULATORY_CARE_PROVIDER_SITE_OTHER): Payer: BC Managed Care – PPO | Admitting: Family Medicine

## 2017-05-23 VITALS — BP 116/60 | Temp 99.4°F | Wt 99.2 lb

## 2017-05-23 DIAGNOSIS — S161XXA Strain of muscle, fascia and tendon at neck level, initial encounter: Secondary | ICD-10-CM | POA: Diagnosis not present

## 2017-05-23 NOTE — Progress Notes (Signed)
   Subjective:    Patient ID: Isabel Atkinson, female    DOB: March 03, 1966, 51 y.o.   MRN: [id number]  HPI Patient in today for possible swollen gland. Patient also has c/o headache.  Pos aching post neck   sveral wks duratioi Pain is worse with motion. Primarily left side.Worse in the region the left sternocleidomastoid muscle. Radiates towards the ear. Recalls no sudden injury but has been doing more coughing lately   No fever or chills   Some headache on involve sid    Review of Systems No headache, no major weight loss or weight gain, no chest pain no back pain abdominal pain no change in bowel habits complete ROS otherwise negative     Objective:   Physical Exam  Alert vitals stable, NAD. Blood pressure good on repeat. HEENT normal. Lungs clear. Heart regular rate and rhythm. TMs normal pharynx normal no lymphadenitis positive diffuse tenderness along left sternocleidomastoid      Assessment & Plan:  Impression neck muscle strain plan local measures discuti-inflammatare discusse

## 2017-06-10 ENCOUNTER — Ambulatory Visit (INDEPENDENT_AMBULATORY_CARE_PROVIDER_SITE_OTHER): Payer: BC Managed Care – PPO | Admitting: Nurse Practitioner

## 2017-06-10 ENCOUNTER — Other Ambulatory Visit: Payer: Self-pay | Admitting: Gastroenterology

## 2017-06-10 ENCOUNTER — Encounter: Payer: Self-pay | Admitting: Nurse Practitioner

## 2017-06-10 ENCOUNTER — Encounter: Payer: Self-pay | Admitting: Family Medicine

## 2017-06-10 VITALS — BP 110/70 | Temp 99.2°F | Wt 100.4 lb

## 2017-06-10 DIAGNOSIS — J029 Acute pharyngitis, unspecified: Secondary | ICD-10-CM

## 2017-06-10 LAB — POCT RAPID STREP A (OFFICE): RAPID STREP A SCREEN: NEGATIVE

## 2017-06-10 MED ORDER — AZITHROMYCIN 250 MG PO TABS: ORAL_TABLET | ORAL | 0 refills | Status: DC

## 2017-06-11 ENCOUNTER — Encounter: Payer: Self-pay | Admitting: Nurse Practitioner

## 2017-06-11 LAB — STREP A DNA PROBE: Strep Gp A Direct, DNA Probe: NEGATIVE

## 2017-06-11 NOTE — Progress Notes (Signed)
Subjective: Presents for complaints of sore throat couple of days.  No fever.  Slight headache.  No runny nose cough or ear pain or wheezing.  No rash.  Nausea but no vomiting.  No diarrhea or abdominal pain.  Taking fluids well.  Voiding normal limit.  No known contacts.  Reflux stable on her current medication.  Objective:   BP 110/70   Temp 99.2 F (37.3 C) (Oral)   Wt 100 lb 6 oz (45.5 kg)   BMI 18.36 kg/m  NAD.  Alert, oriented.  TMs minimal clear effusion, no erythema.  Pharynx minimally injected with cloudy PND noted.  Neck supple with mild soft anterior adenopathy.  Lungs clear.  Heart regular rate and rhythm.  Abdomen soft nondistended with mild epigastric area tenderness.  RST negative.  Assessment:  Acute pharyngitis, unspecified etiology - Plan: POCT rapid strep A, Strep A DNA probe    Plan:   Meds ordered this encounter  Medications  . azithromycin (ZITHROMAX Z-PAK) 250 MG tablet    Sig: Take 2 tablets (500 mg) on  Day 1,  followed by 1 tablet (250 mg) once daily on Days 2 through 5.    Dispense:  6 each    Refill:  0    Order Specific Question:   Supervising Provider    Answer:   Maggie Font   Given written copy of Zithromax to have over the weekend in case symptoms worsen.  Throat culture pending.  Reviewed symptomatic care and warning signs.  Call back next week if no improvement, sooner if worse.  Recommend OTC Maalox for reflux in addition to her omeprazole.

## 2017-06-22 ENCOUNTER — Encounter: Payer: Self-pay | Admitting: Family Medicine

## 2017-06-22 ENCOUNTER — Ambulatory Visit: Payer: BC Managed Care – PPO | Admitting: Family Medicine

## 2017-06-22 VITALS — BP 122/72 | Temp 99.3°F | Ht 62.0 in | Wt 100.0 lb

## 2017-06-22 DIAGNOSIS — R300 Dysuria: Secondary | ICD-10-CM | POA: Diagnosis not present

## 2017-06-22 DIAGNOSIS — N23 Unspecified renal colic: Secondary | ICD-10-CM | POA: Diagnosis not present

## 2017-06-22 LAB — POCT URINALYSIS DIPSTICK
Blood, UA: NEGATIVE
LEUKOCYTES UA: NEGATIVE
PH UA: 7 (ref 5.0–8.0)
Spec Grav, UA: <=1.005 — AB (ref 1.010–1.025)

## 2017-06-22 MED ORDER — OXYCODONE-ACETAMINOPHEN 5-325 MG PO TABS: 1.0000 | ORAL_TABLET | ORAL | 0 refills | Status: DC | PRN

## 2017-06-22 MED ORDER — IBUPROFEN 600 MG PO TABS: 600.0000 mg | ORAL_TABLET | Freq: Three times a day (TID) | ORAL | 3 refills | Status: DC

## 2017-06-22 NOTE — Progress Notes (Signed)
   Subjective:    Patient ID: Isabel Atkinson, female    DOB: 03-Jun-1966, 51 y.o.   MRN: [id number]  HPI  Patient is here today complaining of left side pain toward her back.No burning stinging while urinating, has not seen any visible blood. Says the pain started two days ago. Has been taking Tylenol which has relieved the pain some. This just started up recently she had similar pain and had a plain x-ray which showed a 5 mm stone we tried to get a CAT scan because she is having ongoing pain her parents deny that they stated that she had to get an ultrasound first so therefore we ordered an ultrasound and they denied the CAT scan again then the pain went away and just recently came back 2 days ago she denies nausea vomiting fever chills Review of Systems Patient relates left flank pain she denies abdominal pain denies sweats chills fever denies abdominal pressure denies rectal bleeding or hematuria    Objective:   Physical Exam  Results for orders placed or performed in visit on 06/22/17  POCT urinalysis dipstick  Result Value Ref Range   Color, UA     Clarity, UA     Glucose, UA     Bilirubin, UA     Ketones, UA     Spec Grav, UA <=1.005 (A) 1.010 - 1.025   Blood, UA Negative    pH, UA 7.0 5.0 - 8.0   Protein, UA     Urobilinogen, UA  0.2 or 1.0 E.U./dL   Nitrite, UA     Leukocytes, UA Negative Negative   Lungs clear respiratory rate normal heart is regular abdomen is soft no guarding rebound or tenderness left flank mild tenderness pain midway between the kidney and the lower back  Urine did not show any RBC or WBC    Assessment & Plan:  Significant left flank pain I believe this is probably related to kidney stone prescription for pain medicine given if she has persistent or ongoing trouble we will need to do CT scan but the last time we tried to do this we went through multiple hoops instilled the insurance company denied it every time so therefore if she has ongoing trouble  we will refer her to [facility] her urologist who will do further evaluation

## 2017-07-05 ENCOUNTER — Other Ambulatory Visit: Payer: Self-pay | Admitting: Adult Health

## 2017-07-14 ENCOUNTER — Telehealth: Payer: Self-pay | Admitting: Neurology

## 2017-07-14 ENCOUNTER — Ambulatory Visit: Payer: BC Managed Care – PPO | Admitting: Neurology

## 2017-07-14 ENCOUNTER — Other Ambulatory Visit: Payer: Self-pay | Admitting: Neurology

## 2017-07-14 NOTE — Telephone Encounter (Signed)
This patient did not show for a revisit appointment today. 

## 2017-07-14 NOTE — Progress Notes (Signed)
No showed for appointment

## 2017-07-15 ENCOUNTER — Encounter: Payer: Self-pay | Admitting: Neurology

## 2017-07-25 ENCOUNTER — Encounter: Payer: Self-pay | Admitting: Family Medicine

## 2017-07-25 ENCOUNTER — Telehealth: Payer: Self-pay | Admitting: Family Medicine

## 2017-07-25 MED ORDER — IBUPROFEN 600 MG PO TABS: 600.0000 mg | ORAL_TABLET | Freq: Three times a day (TID) | ORAL | 1 refills | Status: DC

## 2017-07-25 NOTE — Telephone Encounter (Signed)
Ibuprofen 600 mg 1 3 times daily as needed pain, #30, 1 refill-may have work excuse as requested

## 2017-07-25 NOTE — Telephone Encounter (Signed)
Prescription sent electronically to pharmacy and work excuse up front for pick up. Patient notified.

## 2017-07-25 NOTE — Telephone Encounter (Signed)
Requesting a work excuse today due to missing because of her kidney stones.  Also, would like Rx for ibuprofen 600 mg to [facility].  She called Dr. Ralene Muskrat office and can't get an appointment until Aug 26, 2017.

## 2017-07-26 ENCOUNTER — Encounter: Payer: Self-pay | Admitting: Family Medicine

## 2017-07-26 ENCOUNTER — Ambulatory Visit: Payer: BC Managed Care – PPO | Admitting: Family Medicine

## 2017-07-26 VITALS — BP 110/64 | Temp 99.0°F | Ht 62.0 in | Wt 98.2 lb

## 2017-07-26 DIAGNOSIS — R300 Dysuria: Secondary | ICD-10-CM

## 2017-07-26 DIAGNOSIS — J00 Acute nasopharyngitis [common cold]: Secondary | ICD-10-CM | POA: Diagnosis not present

## 2017-07-26 DIAGNOSIS — N390 Urinary tract infection, site not specified: Secondary | ICD-10-CM

## 2017-07-26 LAB — POCT URINALYSIS DIPSTICK
PH UA: 7 (ref 5.0–8.0)
SPEC GRAV UA: 1.01 (ref 1.010–1.025)

## 2017-07-26 MED ORDER — CIPROFLOXACIN HCL 500 MG PO TABS: 500.0000 mg | ORAL_TABLET | Freq: Two times a day (BID) | ORAL | 0 refills | Status: AC

## 2017-07-26 NOTE — Progress Notes (Signed)
   Subjective:    Patient ID: Isabel Atkinson, female    DOB: May 20, 1966, 51 y.o.   MRN: [id number]  Urinary Tract Infection   This is a new problem. Episode onset: 6 weeks.   Left ear pain and sinus congestion.   Results for orders placed or performed in visit on 07/26/17  POCT urinalysis dipstick  Result Value Ref Range   Color, UA     Clarity, UA     Glucose, UA     Bilirubin, UA     Ketones, UA     Spec Grav, UA 1.010 1.010 - 1.025   Blood, UA trace    pH, UA 7.0 5.0 - 8.0   Protein, UA     Urobilinogen, UA  0.2 or 1.0 E.U./dL   Nitrite, UA     Leukocytes, UA Trace (A) Negative   Appearance     Odor     Released By: Jesusita Oka, LPN Authorizing: Kathyrn Drown, MD        Order Information  Order Date/Time Release Date/Time Start Date/Time End Date/Time  10/09/15 10:15 AM 10/09/15 10:15 AM 10/09/15 10:15 AM None     Order Details  Frequency Duration Priority Order Class    Positive increased frequency mild lower abdominal tenderness.  Sharp in nature.  Comes and goes.  Worse with urinating.  Also respiratory congestion positive nasal discharge no substernal fever  Review of Systems No headache, no major weight loss or weight gain, no chest pain no back pain abdominal pain no change in bowel habits complete ROS otherwise negative     Objective:   Physical Exam  Alert, mild malaise. Hydration good Vitals stable. frontal/ maxillary tenderness evident positive nasal congestion. pharynx normal neck supple  lungs clear/no crackles or wheezes. heart regular in rhythm No true CVA tenderness  Urinalysis 2-4 whites per high-powered field  Impression probable MRSA infection.  Discussed.  To initiate medication  Rhinosinusitis early discussed will also cover with antibiotics  Await culture results      Assessment & Plan:

## 2017-07-29 LAB — URINE CULTURE

## 2017-08-02 ENCOUNTER — Encounter: Payer: Self-pay | Admitting: Family Medicine

## 2017-08-12 ENCOUNTER — Ambulatory Visit: Payer: BC Managed Care – PPO | Admitting: Family Medicine

## 2017-08-12 VITALS — Temp 99.2°F | Wt 101.0 lb

## 2017-08-12 DIAGNOSIS — J019 Acute sinusitis, unspecified: Secondary | ICD-10-CM

## 2017-08-12 DIAGNOSIS — J069 Acute upper respiratory infection, unspecified: Secondary | ICD-10-CM

## 2017-08-12 DIAGNOSIS — R6889 Other general symptoms and signs: Secondary | ICD-10-CM

## 2017-08-12 MED ORDER — CEFDINIR 300 MG PO CAPS: 300.0000 mg | ORAL_CAPSULE | Freq: Two times a day (BID) | ORAL | 0 refills | Status: DC

## 2017-08-12 NOTE — Progress Notes (Signed)
   Subjective:    Patient ID: Isabel Atkinson, female    DOB: 12-30-1965, 51 y.o.   MRN: [id number]  Sinusitis  This is a new problem. Episode onset: one week. Associated symptoms include congestion, coughing and headaches. Pertinent negatives include no ear pain or shortness of breath. (Fever)   Patient with body aches head congestion drainage coughing denies high fever chills wheezing nausea vomiting diarrhea does relate low-grade fevers relates body aches muscle aches head congestion sinus pressure pain discomfort PMH benign is a smoker she knows she needs to quit   Review of Systems  Constitutional: Negative for activity change and fever.  HENT: Positive for congestion and rhinorrhea. Negative for ear pain.   Eyes: Negative for discharge.  Respiratory: Positive for cough. Negative for shortness of breath and wheezing.   Cardiovascular: Negative for chest pain.  Neurological: Positive for headaches.       Objective:   Physical Exam  Constitutional: She appears well-developed.  HENT:  Head: Normocephalic.  Right Ear: External ear normal.  Left Ear: External ear normal.  Nose: Nose normal.  Mouth/Throat: Oropharynx is clear and moist. No oropharyngeal exudate.  Eyes: Right eye exhibits no discharge. Left eye exhibits no discharge.  Neck: Neck supple. No tracheal deviation present.  Cardiovascular: Normal rate and normal heart sounds.  No murmur heard. Pulmonary/Chest: Effort normal and breath sounds normal. She has no wheezes. She has no rales.  Lymphadenopathy:    She has no cervical adenopathy.  Skin: Skin is warm and dry.  Nursing note and vitals reviewed.  Mild sinus tenderness to percussion       Assessment & Plan:  Viral syndrome Patient was seen today for upper respiratory illness. It is felt that the patient is dealing with sinusitis. Antibiotics were prescribed today. Importance of compliance with medication was discussed. Symptoms should gradually resolve  over the course of the next several days. If high fevers, progressive illness, difficulty breathing, worsening condition or failure for symptoms to improve over the next several days then the patient is to follow-up. If any emergent conditions the patient is to follow-up in the emergency department otherwise to follow-up in the office.  Also symptoms consistent with influenza or flulike illness should gradually get better over the next several days

## 2017-08-18 ENCOUNTER — Encounter: Payer: Self-pay | Admitting: Family Medicine

## 2017-08-22 ENCOUNTER — Encounter: Payer: Self-pay | Admitting: Family Medicine

## 2017-08-22 ENCOUNTER — Ambulatory Visit: Payer: BC Managed Care – PPO | Admitting: Family Medicine

## 2017-08-22 VITALS — BP 138/82 | Temp 99.6°F | Ht 62.0 in | Wt 98.0 lb

## 2017-08-22 DIAGNOSIS — I889 Nonspecific lymphadenitis, unspecified: Secondary | ICD-10-CM | POA: Diagnosis not present

## 2017-08-22 DIAGNOSIS — J029 Acute pharyngitis, unspecified: Secondary | ICD-10-CM | POA: Diagnosis not present

## 2017-08-22 LAB — POCT RAPID STREP A (OFFICE): Rapid Strep A Screen: NEGATIVE

## 2017-08-22 MED ORDER — CLARITHROMYCIN 500 MG PO TABS: 500.0000 mg | ORAL_TABLET | Freq: Two times a day (BID) | ORAL | 0 refills | Status: DC

## 2017-08-22 NOTE — Progress Notes (Signed)
   Subjective:    Patient ID: Isabel Atkinson, female    DOB: 10-10-1965, 52 y.o.   MRN: [id number]  HPI Patient is here today with complaints of a sore throat since Friday.She states on Friday she felt she had something stuck in her throat and she looked and there were white spots on the back of her throat. She states she is still on cefdinir 300 mg Two times daily states she has two more doses of this.  Has two more cefidinier but held off  fri developed discomfort in the throat Results for orders placed or performed in visit on 07/26/17  Urine Culture  Result Value Ref Range   Urine Culture, Routine Final report (A)    Organism ID, Bacteria Enterococcus faecalis (A)    ORGANISM ID, BACTERIA Klebsiella pneumoniae (A)    Antimicrobial Susceptibility Comment   POCT urinalysis dipstick  Result Value Ref Range   Color, UA     Clarity, UA     Glucose, UA     Bilirubin, UA     Ketones, UA     Spec Grav, UA 1.010 1.010 - 1.025   Blood, UA trace    pH, UA 7.0 5.0 - 8.0   Protein, UA     Urobilinogen, UA  0.2 or 1.0 E.U./dL   Nitrite, UA     Leukocytes, UA Trace (A) Negative   Appearance     Odor      Developed white spot in the throat   yest the throat got sore  Temp no obv fever  Low   Review of Systems No headache, no major weight loss or weight gain, no chest pain no back pain abdominal pain no change in bowel habits complete ROS otherwise negative     Objective:   Physical Exam  Alert vitals stable, moderate malaise. Blood pressure good on repeat. HEENT distinct left tonsillar plug with tender left anterior nodes impression normal. Lungs clear. Heart regular rate and rhythm.       Assessment & Plan:  Cryptic tonsillitis local measures discussed.  Antibiotics prescribed symptom care discussed to return may need ENT referral rationale discussed

## 2017-08-23 LAB — STREP A DNA PROBE: Strep Gp A Direct, DNA Probe: NEGATIVE

## 2017-08-26 ENCOUNTER — Other Ambulatory Visit (HOSPITAL_COMMUNITY)
Admission: AD | Admit: 2017-08-26 | Discharge: 2017-08-26 | Disposition: A | Discharge status: Home / Self Care | Payer: BC Managed Care – PPO | Source: Other Acute Inpatient Hospital | Attending: Urology | Admitting: Urology

## 2017-08-26 ENCOUNTER — Ambulatory Visit: Payer: BC Managed Care – PPO | Admitting: Urology

## 2017-08-26 DIAGNOSIS — Z87442 Personal history of urinary calculi: Secondary | ICD-10-CM

## 2017-08-26 DIAGNOSIS — R3121 Asymptomatic microscopic hematuria: Secondary | ICD-10-CM | POA: Insufficient documentation

## 2017-08-26 DIAGNOSIS — M5135 Other intervertebral disc degeneration, thoracolumbar region: Secondary | ICD-10-CM | POA: Diagnosis not present

## 2017-08-26 DIAGNOSIS — R1084 Generalized abdominal pain: Secondary | ICD-10-CM

## 2017-08-26 LAB — URINALYSIS, COMPLETE (UACMP) WITH MICROSCOPIC
BACTERIA UA: NONE SEEN
Bilirubin Urine: NEGATIVE
Glucose, UA: NEGATIVE mg/dL
Hgb urine dipstick: NEGATIVE
KETONES UR: NEGATIVE mg/dL
LEUKOCYTES UA: NEGATIVE
Nitrite: NEGATIVE
PH: 6 (ref 5.0–8.0)
Protein, ur: NEGATIVE mg/dL
Specific Gravity, Urine: 1.013 (ref 1.005–1.030)

## 2017-09-14 ENCOUNTER — Ambulatory Visit (INDEPENDENT_AMBULATORY_CARE_PROVIDER_SITE_OTHER): Payer: BC Managed Care – PPO

## 2017-09-14 DIAGNOSIS — Z23 Encounter for immunization: Secondary | ICD-10-CM

## 2017-09-19 ENCOUNTER — Other Ambulatory Visit: Payer: Self-pay | Admitting: Family Medicine

## 2017-09-19 DIAGNOSIS — Z1231 Encounter for screening mammogram for malignant neoplasm of breast: Secondary | ICD-10-CM

## 2017-09-29 ENCOUNTER — Encounter (HOSPITAL_COMMUNITY): Payer: Self-pay

## 2017-09-29 ENCOUNTER — Ambulatory Visit (HOSPITAL_COMMUNITY)
Admission: RE | Admit: 2017-09-29 | Discharge: 2017-09-29 | Disposition: A | Discharge status: Home / Self Care | Payer: BC Managed Care – PPO | Source: Ambulatory Visit | Attending: Family Medicine | Admitting: Family Medicine

## 2017-09-29 DIAGNOSIS — Z1231 Encounter for screening mammogram for malignant neoplasm of breast: Secondary | ICD-10-CM | POA: Insufficient documentation

## 2017-09-29 DIAGNOSIS — R928 Other abnormal and inconclusive findings on diagnostic imaging of breast: Secondary | ICD-10-CM | POA: Insufficient documentation

## 2017-10-03 ENCOUNTER — Other Ambulatory Visit: Payer: Self-pay | Admitting: Family Medicine

## 2017-10-03 DIAGNOSIS — R928 Other abnormal and inconclusive findings on diagnostic imaging of breast: Secondary | ICD-10-CM

## 2017-10-11 ENCOUNTER — Ambulatory Visit (HOSPITAL_COMMUNITY)
Admission: RE | Admit: 2017-10-11 | Discharge: 2017-10-11 | Disposition: A | Discharge status: Home / Self Care | Payer: BC Managed Care – PPO | Source: Ambulatory Visit | Attending: Family Medicine | Admitting: Family Medicine

## 2017-10-11 DIAGNOSIS — R928 Other abnormal and inconclusive findings on diagnostic imaging of breast: Secondary | ICD-10-CM | POA: Diagnosis present

## 2017-10-11 DIAGNOSIS — N6001 Solitary cyst of right breast: Secondary | ICD-10-CM | POA: Diagnosis not present

## 2017-10-19 ENCOUNTER — Ambulatory Visit (INDEPENDENT_AMBULATORY_CARE_PROVIDER_SITE_OTHER): Payer: BC Managed Care – PPO | Admitting: Nurse Practitioner

## 2017-10-19 ENCOUNTER — Ambulatory Visit (HOSPITAL_COMMUNITY)
Admission: RE | Admit: 2017-10-19 | Discharge: 2017-10-19 | Disposition: A | Discharge status: Home / Self Care | Payer: BC Managed Care – PPO | Source: Ambulatory Visit | Attending: Nurse Practitioner | Admitting: Nurse Practitioner

## 2017-10-19 ENCOUNTER — Other Ambulatory Visit (HOSPITAL_COMMUNITY)
Admission: RE | Admit: 2017-10-19 | Discharge: 2017-10-19 | Disposition: A | Discharge status: Home / Self Care | Payer: BC Managed Care – PPO | Source: Ambulatory Visit | Attending: *Deleted | Admitting: *Deleted

## 2017-10-19 ENCOUNTER — Encounter: Payer: Self-pay | Admitting: Nurse Practitioner

## 2017-10-19 VITALS — BP 132/78 | Ht 62.0 in | Wt 101.0 lb

## 2017-10-19 DIAGNOSIS — Z79899 Other long term (current) drug therapy: Secondary | ICD-10-CM | POA: Diagnosis not present

## 2017-10-19 DIAGNOSIS — Z1151 Encounter for screening for human papillomavirus (HPV): Secondary | ICD-10-CM | POA: Diagnosis not present

## 2017-10-19 DIAGNOSIS — R5383 Other fatigue: Secondary | ICD-10-CM | POA: Diagnosis not present

## 2017-10-19 DIAGNOSIS — J449 Chronic obstructive pulmonary disease, unspecified: Secondary | ICD-10-CM | POA: Insufficient documentation

## 2017-10-19 DIAGNOSIS — Z124 Encounter for screening for malignant neoplasm of cervix: Secondary | ICD-10-CM | POA: Diagnosis not present

## 2017-10-19 DIAGNOSIS — R079 Chest pain, unspecified: Secondary | ICD-10-CM

## 2017-10-19 DIAGNOSIS — Z0000 Encounter for general adult medical examination without abnormal findings: Secondary | ICD-10-CM | POA: Diagnosis not present

## 2017-10-19 DIAGNOSIS — Z13220 Encounter for screening for lipoid disorders: Secondary | ICD-10-CM

## 2017-10-19 LAB — D-DIMER, QUANTITATIVE (NOT AT ARMC)

## 2017-10-19 MED ORDER — CLARITHROMYCIN 500 MG PO TABS: 500.0000 mg | ORAL_TABLET | Freq: Two times a day (BID) | ORAL | 0 refills | Status: DC

## 2017-10-21 ENCOUNTER — Encounter: Payer: Self-pay | Admitting: Nurse Practitioner

## 2017-10-21 LAB — PAP IG AND HPV HIGH-RISK
HPV, high-risk: NEGATIVE
PAP SMEAR COMMENT: 0

## 2017-10-21 NOTE — Progress Notes (Signed)
Subjective:    Patient ID: Isabel Atkinson, female    DOB: 07/20/1966, 52 y.o.   MRN: [id number]  HPI presents for her wellness exam. No vaginal bleeding or pelvic pain. Same sexual partner. Regular vision and dental exams. Has smoked one ppd for over 35 years. Left chest area has been hurting for 3 days. No specific history of injury but does heavy lifting at work and carries heavy loads of wood on her left arm at home for her wood stove. Pain with deep breath. No unusual SOB. No swelling in the legs. No fever. Minimal cough. Began producing green mucus yesterday.     Review of Systems  Constitutional: Positive for fatigue. Negative for activity change, appetite change and fever.  HENT: Positive for sinus pressure. Negative for dental problem, ear pain and sore throat.   Respiratory: Negative for cough, chest tightness, shortness of breath and wheezing.   Cardiovascular: Positive for chest pain. Negative for leg swelling.  Gastrointestinal: Negative for abdominal distention, abdominal pain, constipation, diarrhea, nausea and vomiting.  Genitourinary: Negative for difficulty urinating, dysuria, enuresis, frequency, genital sores, pelvic pain, urgency, vaginal bleeding and vaginal discharge.       Objective:   Physical Exam  Constitutional: She is oriented to person, place, and time. She appears well-developed. No distress.  HENT:  Right Ear: External ear normal.  Left Ear: External ear normal.  Mouth/Throat: Oropharynx is clear and moist.  Neck: Normal range of motion. Neck supple. No tracheal deviation present. No thyromegaly present.  Cardiovascular: Normal rate, regular rhythm and normal heart sounds. Exam reveals no gallop.  No murmur heard. Pulmonary/Chest: Effort normal and breath sounds normal. No respiratory distress. She has no wheezes. She has no rales. She exhibits tenderness. Right breast exhibits no inverted nipple, no mass, no skin change and no tenderness. Left breast  exhibits no inverted nipple, no mass, no skin change and no tenderness. Breasts are symmetrical.  Generalized tenderness to palpation of the outer lower chest wall left side. No rash noted.  Axillae no adenopathy,   Abdominal: Soft. She exhibits no distension. There is no tenderness.  Genitourinary: Vagina normal and uterus normal. No vaginal discharge found.  Genitourinary Comments: External GU: no rashes or lesions. Vagina: no discharge. Tissue pale. Cervix normal in appearance. No CMT. Bimanual exam: no tenderness or obvious masses.   Musculoskeletal: She exhibits no edema.  Lymphadenopathy:    She has no cervical adenopathy.  Neurological: She is alert and oriented to person, place, and time.  Skin: Skin is warm and dry. No rash noted.  Psychiatric: She has a normal mood and affect. Her behavior is normal.  Vitals reviewed.         Assessment & Plan:  Annual physical exam - Plan: Pap IG and HPV (high risk) DNA detection  Chest pain, unspecified type - Plan: DG Chest 2 View, D-dimer, quantitative (not at [facility]), Basic metabolic panel  High risk medication use - Plan: Hepatic function panel  Lipid screening - Plan: Lipid panel  Fatigue, unspecified type - Plan: CBC with Differential/Platelet, TSH, VITAMIN D 25 Hydroxy (Vit-D Deficiency, Fractures)  Screening for cervical cancer - Plan: Pap IG and HPV (high risk) DNA detection  Screening for HPV (human papillomavirus) - Plan: Pap IG and HPV (high risk) DNA detection  D-Dimer and xray were normal. Patient notified.  Meds ordered this encounter  Medications  . clarithromycin (BIAXIN) 500 MG tablet    Sig: Take 1 tablet (500 mg total) by mouth  2 (two) times daily.    Dispense:  20 tablet    Refill:  0    Order Specific Question:   Supervising Provider    Answer:   Mikey Kirschner [[id number]]   OTC meds as directed for congestion and chest wall pain. Ice/heat applications.  Call back if persists.  Discussed smoking cessation.  Consider low dose CT scan of lungs after she turns 53. Routine labs pending.  Return in about 1 year (around 10/20/2018) for physical.

## 2017-11-11 ENCOUNTER — Ambulatory Visit: Payer: BC Managed Care – PPO | Admitting: Nurse Practitioner

## 2017-11-11 ENCOUNTER — Encounter: Payer: Self-pay | Admitting: Nurse Practitioner

## 2017-11-11 VITALS — BP 110/74 | Temp 99.2°F | Ht 62.0 in | Wt 101.4 lb

## 2017-11-11 DIAGNOSIS — J069 Acute upper respiratory infection, unspecified: Secondary | ICD-10-CM | POA: Diagnosis not present

## 2017-11-11 DIAGNOSIS — B9689 Other specified bacterial agents as the cause of diseases classified elsewhere: Secondary | ICD-10-CM | POA: Diagnosis not present

## 2017-11-11 MED ORDER — CEFPROZIL 500 MG PO TABS: 500.0000 mg | ORAL_TABLET | Freq: Two times a day (BID) | ORAL | 0 refills | Status: DC

## 2017-11-11 NOTE — Progress Notes (Signed)
Subjective:  Presents for c/o cough and sinus headache that began about a week ago. Low grade fever. No sore throat or ear pain. Frontal area headache. Runny nose. Increased cough last night. Now producing yellow mucus. No wheezing. Has not used her albuterol inhaler but has it available if needed. Has been using her Claritin and Nasacort.   Objective:   BP 110/74   Temp 99.2 F (37.3 C) (Oral)   Ht 5\' 2"  (1.575 m)   Wt 101 lb 6.4 oz (46 kg)   BMI 18.55 kg/m  NAD. Alert, oriented. TMs clear effusion. Pharynx injected with yellow PND noted. Neck supple with mild anterior adenopathy. Lungs: breath sounds mildly diminished but clear. No wheezing or tachypnea. Heart RRR.   Assessment:  Bacterial upper respiratory infection    Plan:   Meds ordered this encounter  Medications  . cefPROZIL (CEFZIL) 500 MG tablet    Sig: Take 1 tablet (500 mg total) by mouth 2 (two) times daily.    Dispense:  20 tablet    Refill:  0    Order Specific Question:   Supervising Provider    Answer:   Mikey Kirschner [[id number]]   Continue current OTC meds as directed. Call back if worsens or persists.

## 2017-11-12 ENCOUNTER — Encounter: Payer: Self-pay | Admitting: Nurse Practitioner

## 2017-11-18 ENCOUNTER — Ambulatory Visit: Payer: BC Managed Care – PPO | Admitting: Urology

## 2017-11-21 ENCOUNTER — Other Ambulatory Visit: Payer: Self-pay | Admitting: Gastroenterology

## 2017-12-02 LAB — CBC WITH DIFFERENTIAL/PLATELET
BASOS ABS: 0 10*3/uL (ref 0.0–0.2)
Basos: 1 %
EOS (ABSOLUTE): 0.4 10*3/uL (ref 0.0–0.4)
Eos: 7 %
HEMATOCRIT: 39.7 % (ref 34.0–46.6)
Hemoglobin: 12.7 g/dL (ref 11.1–15.9)
Immature Grans (Abs): 0 10*3/uL (ref 0.0–0.1)
Immature Granulocytes: 0 %
LYMPHS ABS: 1.9 10*3/uL (ref 0.7–3.1)
Lymphs: 37 %
MCH: 28.8 pg (ref 26.6–33.0)
MCHC: 32 g/dL (ref 31.5–35.7)
MCV: 90 fL (ref 79–97)
Monocytes Absolute: 0.4 10*3/uL (ref 0.1–0.9)
Monocytes: 7 %
NEUTROS ABS: 2.6 10*3/uL (ref 1.4–7.0)
Neutrophils: 48 %
Platelets: 287 10*3/uL (ref 150–379)
RBC: 4.41 x10E6/uL (ref 3.77–5.28)
RDW: 14 % (ref 12.3–15.4)
WBC: 5.3 10*3/uL (ref 3.4–10.8)

## 2017-12-02 LAB — BASIC METABOLIC PANEL
BUN/Creatinine Ratio: 27 — ABNORMAL HIGH (ref 9–23)
BUN: 15 mg/dL (ref 6–24)
CALCIUM: 9.4 mg/dL (ref 8.7–10.2)
CO2: 24 mmol/L (ref 20–29)
Chloride: 105 mmol/L (ref 96–106)
Creatinine, Ser: 0.56 mg/dL — ABNORMAL LOW (ref 0.57–1.00)
GFR calc Af Amer: 123 mL/min/{1.73_m2} (ref >59)
GFR calc non Af Amer: 107 mL/min/{1.73_m2} (ref >59)
GLUCOSE: 85 mg/dL (ref 65–99)
POTASSIUM: 4.1 mmol/L (ref 3.5–5.2)
SODIUM: 144 mmol/L (ref 134–144)

## 2017-12-02 LAB — VITAMIN D 25 HYDROXY (VIT D DEFICIENCY, FRACTURES): Vit D, 25-Hydroxy: 27.5 ng/mL — ABNORMAL LOW (ref 30.0–100.0)

## 2017-12-02 LAB — LIPID PANEL
Chol/HDL Ratio: 2.7 ratio (ref 0.0–4.4)
Cholesterol, Total: 200 mg/dL — ABNORMAL HIGH (ref 100–199)
HDL: 75 mg/dL (ref >39)
LDL Calculated: 111 mg/dL — ABNORMAL HIGH (ref 0–99)
Triglycerides: 69 mg/dL (ref 0–149)
VLDL Cholesterol Cal: 14 mg/dL (ref 5–40)

## 2017-12-02 LAB — HEPATIC FUNCTION PANEL
ALT: 16 IU/L (ref 0–32)
AST: 20 IU/L (ref 0–40)
Albumin: 4.5 g/dL (ref 3.5–5.5)
Alkaline Phosphatase: 71 IU/L (ref 39–117)
Bilirubin Total: 0.4 mg/dL (ref 0.0–1.2)
Bilirubin, Direct: 0.11 mg/dL (ref 0.00–0.40)
Total Protein: 6.6 g/dL (ref 6.0–8.5)

## 2017-12-02 LAB — TSH: TSH: 1.11 u[IU]/mL (ref 0.450–4.500)

## 2017-12-07 ENCOUNTER — Telehealth: Payer: Self-pay | Admitting: Family Medicine

## 2017-12-07 ENCOUNTER — Encounter: Payer: Self-pay | Admitting: Family Medicine

## 2017-12-07 NOTE — Telephone Encounter (Signed)
That would be fine to give her a work note

## 2017-12-07 NOTE — Telephone Encounter (Signed)
Done, notified [facility] to pick up.

## 2017-12-07 NOTE — Telephone Encounter (Signed)
Requesting a work excuse for today and yesterday due to having a stomach virus.

## 2018-01-06 ENCOUNTER — Ambulatory Visit: Payer: BC Managed Care – PPO | Admitting: Family Medicine

## 2018-01-06 ENCOUNTER — Encounter: Payer: Self-pay | Admitting: Family Medicine

## 2018-01-06 VITALS — BP 122/70 | Temp 98.9°F | Ht 62.0 in | Wt 101.0 lb

## 2018-01-06 DIAGNOSIS — H9202 Otalgia, left ear: Secondary | ICD-10-CM | POA: Diagnosis not present

## 2018-01-06 MED ORDER — NEOMYCIN-POLYMYXIN-HC 3.5-10000-1 OT SOLN: OTIC | 0 refills | Status: DC

## 2018-01-06 NOTE — Progress Notes (Signed)
   Subjective:    Patient ID: Isabel Atkinson, female    DOB: Aug 26, 1965, 52 y.o.   MRN: [id number]  HPIbug flew in left ear last night.  Patient does not think that she got it out.  Fluttering.  She put a Q-tip in there.  Did not recall getting insect out.  Ongoing pain and discomfort.  No trouble hearing.  No headache no trouble swallowing     Review of Systems No fever no discharge    Objective:   Physical Exam   Alert vitals stable, NAD. Blood pressure good on repeat. HEENT normal. Lungs clear. Heart regular rate and rhythm. Is careful exam of the vault ear feelsSome abrasions mild swelling of the external canal but no foreign body specifically no insect     Assessment & Plan:  1 impression initial flying insect foreign body in ear exited somehow with residual external canal injury.  Combination antibiotic steroid drops prescribed symptom care discussed

## 2018-01-19 ENCOUNTER — Ambulatory Visit: Payer: BC Managed Care – PPO | Admitting: Family Medicine

## 2018-01-19 ENCOUNTER — Encounter: Payer: Self-pay | Admitting: Family Medicine

## 2018-01-19 VITALS — BP 128/78 | Temp 99.9°F | Ht 62.0 in | Wt 100.0 lb

## 2018-01-19 DIAGNOSIS — Z23 Encounter for immunization: Secondary | ICD-10-CM | POA: Diagnosis not present

## 2018-01-19 DIAGNOSIS — J310 Chronic rhinitis: Secondary | ICD-10-CM

## 2018-01-19 DIAGNOSIS — J329 Chronic sinusitis, unspecified: Secondary | ICD-10-CM

## 2018-01-19 MED ORDER — CEFPROZIL 500 MG PO TABS: 500.0000 mg | ORAL_TABLET | Freq: Two times a day (BID) | ORAL | 0 refills | Status: DC

## 2018-01-19 NOTE — Progress Notes (Signed)
   Subjective:    Patient ID: Isabel Atkinson, female    DOB: 05-Jul-1966, 52 y.o.   MRN: [id number]  HPI  Patient is here today with complaints of a sinus infection.Ongoing for a week her head and face hurt. She is taking mucinex sinus and nettie pot, which helps some. Frontal headache severe  Facial and frontal and dhcheeks  Using muc sinus and nettie pots   Energy diminshed   No sig nasal discharge     Down into thebrochial symt no tick bites    Review of Systems No headache, no major weight loss or weight gain, no chest pain no back pain abdominal pain no change in bowel habits complete ROS otherwise negative     Objective:   Physical Exam   Alert, mild malaise. Hydration good Vitals stable. frontal/ maxillary tenderness evident positive nasal congestion. pharynx normal neck supple  lungs clear/no crackles or wheezes. heart regular in rhythm      Assessment & Plan:  Impression rhinosinusitis likely post viral, discussed with patient. plan antibiotics prescribed. Questions answered. Symptomatic care discussed. warning signs discussed. WSL

## 2018-01-26 ENCOUNTER — Telehealth: Payer: Self-pay

## 2018-01-26 ENCOUNTER — Encounter: Payer: Self-pay | Admitting: Nurse Practitioner

## 2018-01-26 ENCOUNTER — Ambulatory Visit: Payer: BC Managed Care – PPO | Admitting: Nurse Practitioner

## 2018-01-26 VITALS — BP 132/86 | Temp 98.8°F | Ht 62.0 in | Wt 99.2 lb

## 2018-01-26 DIAGNOSIS — T7840XA Allergy, unspecified, initial encounter: Secondary | ICD-10-CM

## 2018-01-26 DIAGNOSIS — R300 Dysuria: Secondary | ICD-10-CM | POA: Diagnosis not present

## 2018-01-26 DIAGNOSIS — B3731 Acute candidiasis of vulva and vagina: Secondary | ICD-10-CM

## 2018-01-26 DIAGNOSIS — B373 Candidiasis of vulva and vagina: Secondary | ICD-10-CM

## 2018-01-26 DIAGNOSIS — N10 Acute tubulo-interstitial nephritis: Secondary | ICD-10-CM | POA: Diagnosis not present

## 2018-01-26 LAB — POCT URINALYSIS DIPSTICK
PH UA: 5 (ref 5.0–8.0)
Spec Grav, UA: 1.025 (ref 1.010–1.025)

## 2018-01-26 MED ORDER — METHYLPREDNISOLONE ACETATE 40 MG/ML IJ SUSP
40.0000 mg | Freq: Once | INTRAMUSCULAR | Status: AC
  Administered 2018-01-26: 40 mg via INTRAMUSCULAR

## 2018-01-26 MED ORDER — EPINEPHRINE 0.3 MG/0.3ML IJ SOAJ: 0.3000 mg | Freq: Once | INTRAMUSCULAR | 0 refills | Status: AC

## 2018-01-26 MED ORDER — CIPROFLOXACIN HCL 500 MG PO TABS: 500.0000 mg | ORAL_TABLET | Freq: Two times a day (BID) | ORAL | 0 refills | Status: DC

## 2018-01-26 MED ORDER — PREDNISONE 20 MG PO TABS: ORAL_TABLET | ORAL | 0 refills | Status: DC

## 2018-01-26 NOTE — Telephone Encounter (Signed)
Patient walked into the clinic today with complaints of with in five minutes of taking cipro 500 she developed a splitting headache,itching all over her body, lips and mouth burning. Pearson Forster NP came into the room and evaluated the pt. She asked that we administer Depo medrol 40 mg SQ, and have pt to give another urine sample to send for culture. She then sent in an epi pen and another antibx for the pt to use. I administered the Depo medrol 40 mg in her right arm and sent the urine for cx. I advised the pt if she gets worse go to the ed.She is aware we sent in new rx's for her to pick up from her pharmacy. I then walked the pt out to the waiting room to be escorted out by her husband.

## 2018-01-26 NOTE — Patient Instructions (Signed)
Stop Cefzil and start Cipro

## 2018-01-27 NOTE — Telephone Encounter (Signed)
May restart previous antibiotic. Culture pending. No swelling on the tongue or throat. No difficulty breathing or swallowing. Multiple patches of erythema noted. Especially upper chest. Given Depo Medrol in office. Start prednisone taper tomorrow. Sent in Rx for Epi Pen to use for severe allergic reaction then call EMS. Verbalized understanding. Recheck if further problems.

## 2018-01-28 ENCOUNTER — Encounter: Payer: Self-pay | Admitting: Nurse Practitioner

## 2018-01-28 LAB — URINE CULTURE: ORGANISM ID, BACTERIA: NO GROWTH

## 2018-01-28 LAB — SPECIMEN STATUS REPORT

## 2018-01-28 NOTE — Progress Notes (Signed)
Subjective: Presents for complaints of low back and flank pain for the past 5 days.  No fever.  No nausea vomiting.  No new sexual partners.  Describes mid pelvic area discomfort.  Had some vaginal itching.  Tried using Monistat 3-day therapy for a couple of days but this caused burning.  Currently on Cefzil for rhinosinusitis see previous note.  Dysuria.  Urgency and frequency.  No vaginal discharge.  Last urinary symptoms December 2018.    Objective:   BP 132/86   Temp 98.8 F (37.1 C) (Oral)   Ht 5\' 2"  (1.575 m)   Wt 99 lb 3.2 oz (45 kg)   BMI 18.14 kg/m  NAD.  Alert, oriented.  Lungs clear.  Heart regular rate and rhythm.  Mild left CVA area tenderness.  Abdomen soft nondistended with mild mid suprapubic area discomfort.  External GU non-erythematous, no lesions or rash.  Vagina minimal amount of white discharge, minimal erythema.  No CMT.  Bimanual exam uterus and adnexa nontender, no obvious masses.  Mild suprapubic area tenderness.                              POCT Urinalysis Dipstick  Result Value Ref Range   Color, UA     Clarity, UA     Glucose, UA  Negative   Bilirubin, UA +    Ketones, UA     Spec Grav, UA 1.025 1.010 - 1.025   Blood, UA +    pH, UA 5.0 5.0 - 8.0   Protein, UA  Negative   Urobilinogen, UA  0.2 or 1.0 E.U./dL   Nitrite, UA     Leukocytes, UA Small (1+) (A) Negative   Appearance     Odor     Urine micro negative.  Assessment: Possible acute pyelonephritis - Plan: POCT Urinalysis Dipstick  Vaginal candidiasis  Dysuria - Plan: Urine Culture  Allergic reaction to drug, initial encounter - Plan: methylPREDNISolone acetate (DEPO-MEDROL) injection 40 mg    Plan:   Meds ordered this encounter  Medications  . ciprofloxacin (CIPRO) 500 MG tablet    Sig: Take 1 tablet (500 mg total) by mouth 2 (two) times daily.    Dispense:  20 tablet    Refill:  0    Order Specific Question:   Supervising Provider    Answer:   Mikey Kirschner [[id number]]  .  predniSONE (DELTASONE) 20 MG tablet    Sig: 3 po qd x 3 d then 2 po qd x 3 d then 1 po qd x 2 d    Dispense:  17 tablet    Refill:  0    Order Specific Question:   Supervising Provider    Answer:   Mikey Kirschner [[id number]]  . EPINEPHrine 0.3 mg/0.3 mL IJ SOAJ injection    Sig: Inject 0.3 mLs (0.3 mg total) into the muscle once for 1 dose. Prn severe allergic reaction    Dispense:  1 Device    Refill:  0    Order Specific Question:   Supervising Provider    Answer:   Mikey Kirschner [[id number]]  . methylPREDNISolone acetate (DEPO-MEDROL) injection 40 mg   Continue AZO for 48 hours then discontinue.  Patient was given Cipro prescription and told to stop Cefzil.  A few hours after the visit patient returned to the office for possible allergic reaction see notes in chart for more details.  Chart will be flagged  for reaction to Cipro.  Urine culture pending.

## 2018-03-31 ENCOUNTER — Encounter: Payer: Self-pay | Admitting: Family Medicine

## 2018-03-31 ENCOUNTER — Ambulatory Visit: Payer: BC Managed Care – PPO | Admitting: Family Medicine

## 2018-03-31 VITALS — BP 114/72 | Temp 99.0°F | Ht 62.0 in | Wt 99.0 lb

## 2018-03-31 DIAGNOSIS — J432 Centrilobular emphysema: Secondary | ICD-10-CM

## 2018-03-31 DIAGNOSIS — J019 Acute sinusitis, unspecified: Secondary | ICD-10-CM | POA: Diagnosis not present

## 2018-03-31 MED ORDER — PREDNISONE 20 MG PO TABS: ORAL_TABLET | ORAL | 0 refills | Status: DC

## 2018-03-31 MED ORDER — AMOXICILLIN-POT CLAVULANATE 875-125 MG PO TABS: 1.0000 | ORAL_TABLET | Freq: Two times a day (BID) | ORAL | 0 refills | Status: DC

## 2018-03-31 MED ORDER — FLUCONAZOLE 150 MG PO TABS: 150.0000 mg | ORAL_TABLET | Freq: Once | ORAL | 4 refills | Status: AC

## 2018-03-31 NOTE — Progress Notes (Signed)
   Subjective:    Patient ID: Isabel Atkinson, female    DOB: 28-Mar-1966, 52 y.o.   MRN: [id number]  HPI  Patient is here today with complaints of low grade fever,headache,and cough for a week now. She has taken cough medication, Tylenol.  Review of Systems  Constitutional: Negative for activity change and fever.  HENT: Positive for congestion and rhinorrhea. Negative for ear pain.   Eyes: Negative for discharge.  Respiratory: Positive for cough. Negative for shortness of breath and wheezing.   Cardiovascular: Negative for chest pain.       Objective:   Physical Exam  Constitutional: She appears well-developed.  HENT:  Head: Normocephalic.  Nose: Nose normal.  Mouth/Throat: Oropharynx is clear and moist. No oropharyngeal exudate.  Neck: Neck supple.  Cardiovascular: Normal rate and normal heart sounds.  No murmur heard. Pulmonary/Chest: Effort normal and breath sounds normal. She has no wheezes.  Lymphadenopathy:    She has no cervical adenopathy.  Skin: Skin is warm and dry.  Nursing note and vitals reviewed.   Patient counseled to quit smoking      Assessment & Plan:  Mild COPD repeat pulmonary function test in October flu shot every year patient encouraged to quit smoking  Sinusitis antibiotics prescribed  Steroid prescription printed just in case patient has flareup of COPD she was instructed what to do

## 2018-03-31 NOTE — Progress Notes (Signed)
Reminder placed in reminder file  

## 2018-05-15 ENCOUNTER — Encounter: Payer: Self-pay | Admitting: Family Medicine

## 2018-05-15 ENCOUNTER — Ambulatory Visit: Payer: BC Managed Care – PPO | Admitting: Family Medicine

## 2018-05-15 VITALS — Temp 98.8°F | Ht 62.0 in | Wt 97.6 lb

## 2018-05-15 DIAGNOSIS — R300 Dysuria: Secondary | ICD-10-CM | POA: Diagnosis not present

## 2018-05-15 DIAGNOSIS — M545 Low back pain: Secondary | ICD-10-CM

## 2018-05-15 DIAGNOSIS — S39012A Strain of muscle, fascia and tendon of lower back, initial encounter: Secondary | ICD-10-CM

## 2018-05-15 LAB — POCT URINALYSIS DIPSTICK
PH UA: 6 (ref 5.0–8.0)
Spec Grav, UA: 1.01 (ref 1.010–1.025)

## 2018-05-15 NOTE — Progress Notes (Signed)
    Subjective:    Patient ID: Isabel Atkinson, female    DOB: 19-Oct-1965, 52 y.o.   MRN: [id number]  HPI Patient arrives with dysuria for 4 days. Results for orders placed or performed in visit on 05/15/18  POCT urinalysis dipstick  Result Value Ref Range   Color, UA     Clarity, UA     Glucose, UA     Bilirubin, UA     Ketones, UA     Spec Grav, UA 1.010 1.010 - 1.025   Blood, UA     pH, UA 6.0 5.0 - 8.0   Protein, UA     Urobilinogen, UA     Nitrite, UA     Leukocytes, UA     Appearance     Odor     Hurting in side, nearly all week, had to sleep on heating pad    Some burning wit urination No fevr or chillls  Patient understandably anxious because she had pyelonephritis earlier this year.  Notes left flank pain.  Not particularly worse with movement.  Also diffuse low across the back pain  Review of Systems No rash no fever no vomiting    Objective:   Physical Exam  Alert active good hydration HEENT normal lungs clear heart regular rate and rhythm no true CVA tenderness positive paralumbar tenderness left side more than right  Urinalysis clean white blood cells no red blood cells     Assessment & Plan:  Impression lumbar strain.  Doubt urinary tract infection rationale discussed symptom care discussed warning signs discussed

## 2018-06-08 ENCOUNTER — Other Ambulatory Visit: Payer: Self-pay | Admitting: *Deleted

## 2018-06-08 DIAGNOSIS — J440 Chronic obstructive pulmonary disease with acute lower respiratory infection: Principal | ICD-10-CM

## 2018-06-08 DIAGNOSIS — J209 Acute bronchitis, unspecified: Secondary | ICD-10-CM

## 2018-06-08 DIAGNOSIS — J449 Chronic obstructive pulmonary disease, unspecified: Secondary | ICD-10-CM

## 2018-06-28 ENCOUNTER — Encounter (HOSPITAL_COMMUNITY): Payer: BC Managed Care – PPO

## 2018-06-30 ENCOUNTER — Encounter: Payer: Self-pay | Admitting: Family Medicine

## 2018-06-30 ENCOUNTER — Ambulatory Visit: Payer: BC Managed Care – PPO | Admitting: Family Medicine

## 2018-06-30 VITALS — BP 100/62 | Temp 98.6°F | Ht 62.0 in | Wt 96.8 lb

## 2018-06-30 DIAGNOSIS — J019 Acute sinusitis, unspecified: Secondary | ICD-10-CM

## 2018-06-30 MED ORDER — AMOXICILLIN-POT CLAVULANATE 875-125 MG PO TABS: 1.0000 | ORAL_TABLET | Freq: Two times a day (BID) | ORAL | 0 refills | Status: DC

## 2018-06-30 NOTE — Progress Notes (Signed)
   Subjective:    Patient ID: Isabel Atkinson, female    DOB: August 16, 1965, 52 y.o.   MRN: [id number]  Sinus Problem  This is a new problem. The current episode started in the past 7 days. Associated symptoms include congestion, coughing, ear pain, headaches and a sore throat. Pertinent negatives include no shortness of breath.   Head congestion drainage coughing denies high fever chills sweats denies wheezing difficulty breathing   Review of Systems  Constitutional: Negative for activity change and fever.  HENT: Positive for congestion, ear pain, rhinorrhea and sore throat.   Eyes: Negative for discharge.  Respiratory: Positive for cough. Negative for shortness of breath and wheezing.   Cardiovascular: Negative for chest pain.  Neurological: Positive for headaches.       Objective:   Physical Exam  Constitutional: She appears well-developed.  HENT:  Head: Normocephalic.  Nose: Nose normal.  Mouth/Throat: Oropharynx is clear and moist. No oropharyngeal exudate.  Neck: Neck supple.  Cardiovascular: Normal rate and normal heart sounds.  No murmur heard. Pulmonary/Chest: Effort normal and breath sounds normal. She has no wheezes.  Lymphadenopathy:    She has no cervical adenopathy.  Skin: Skin is warm and dry.  Nursing note and vitals reviewed.         Assessment & Plan:  Acute rhinosinusitis Antibiotic prescribed warning signs discussed Follow-up if progressive troubles or worse

## 2018-07-04 ENCOUNTER — Other Ambulatory Visit: Payer: Self-pay | Admitting: *Deleted

## 2018-07-04 ENCOUNTER — Telehealth: Payer: Self-pay | Admitting: Family Medicine

## 2018-07-04 ENCOUNTER — Ambulatory Visit (INDEPENDENT_AMBULATORY_CARE_PROVIDER_SITE_OTHER): Payer: BC Managed Care – PPO | Admitting: *Deleted

## 2018-07-04 DIAGNOSIS — Z23 Encounter for immunization: Secondary | ICD-10-CM

## 2018-07-04 MED ORDER — CEFPROZIL 500 MG PO TABS: 500.0000 mg | ORAL_TABLET | Freq: Two times a day (BID) | ORAL | 0 refills | Status: DC

## 2018-07-04 NOTE — Telephone Encounter (Signed)
Med sent to pharm. Will tell pt when she comes in for her appt today.

## 2018-07-04 NOTE — Telephone Encounter (Signed)
Pt.notified

## 2018-07-04 NOTE — Telephone Encounter (Signed)
Cefzil 500 mg 1 twice daily for 7 days Stop Augmentin Please put diarrhea a side effect on Augmentin on her profile/allergy profile-not a true allergy more of a side effect

## 2018-07-04 NOTE — Telephone Encounter (Signed)
Patient was prescribe amoxicillin 875/125 for URI and its has given her diarrhea can you prescribe something else.[employer]

## 2018-07-12 ENCOUNTER — Telehealth: Payer: Self-pay | Admitting: Family Medicine

## 2018-07-12 ENCOUNTER — Ambulatory Visit (HOSPITAL_COMMUNITY): Admission: RE | Admit: 2018-07-12 | Payer: BC Managed Care – PPO | Source: Ambulatory Visit

## 2018-07-12 ENCOUNTER — Other Ambulatory Visit: Payer: Self-pay | Admitting: *Deleted

## 2018-07-12 MED ORDER — AZITHROMYCIN 250 MG PO TABS: ORAL_TABLET | ORAL | 0 refills | Status: DC

## 2018-07-12 NOTE — Telephone Encounter (Signed)
Please give work excuse.

## 2018-07-12 NOTE — Telephone Encounter (Signed)
Patient was seen 06/30/2018 with cough and congestion.Pt was intially prescribed Augmentin and it was causing diarrhea and was asked to stop it and was started on Cefzil 500 mg BID for seven days.Please advise.

## 2018-07-12 NOTE — Telephone Encounter (Signed)
Z-Pak May have work excuse If ongoing troubles follow-up

## 2018-07-12 NOTE — Telephone Encounter (Signed)
Patient still has congestion,cough not any better. Can you call in different antibiotic to [place] and she needs work excuse for today.

## 2018-07-12 NOTE — Telephone Encounter (Signed)
Med sent to pharm. Pt notified.  

## 2018-07-12 NOTE — Telephone Encounter (Signed)
Tried to call no answer

## 2018-07-13 ENCOUNTER — Encounter: Payer: Self-pay | Admitting: Family Medicine

## 2018-07-13 ENCOUNTER — Ambulatory Visit: Payer: BC Managed Care – PPO | Admitting: Family Medicine

## 2018-07-13 VITALS — BP 128/76 | Temp 99.0°F | Ht 62.0 in | Wt 96.6 lb

## 2018-07-13 DIAGNOSIS — J440 Chronic obstructive pulmonary disease with acute lower respiratory infection: Secondary | ICD-10-CM | POA: Diagnosis not present

## 2018-07-13 DIAGNOSIS — J209 Acute bronchitis, unspecified: Secondary | ICD-10-CM | POA: Diagnosis not present

## 2018-07-13 MED ORDER — METHYLPREDNISOLONE ACETATE 40 MG/ML IJ SUSP
40.0000 mg | Freq: Once | INTRAMUSCULAR | Status: AC
  Administered 2018-07-13: 40 mg via INTRAMUSCULAR

## 2018-07-13 NOTE — Telephone Encounter (Signed)
Printed out work excuse for 12/4 and 12/5 returning to work 12/6. Ready for pick up upfront.

## 2018-07-13 NOTE — Progress Notes (Signed)
   Subjective:    Patient ID: Isabel Atkinson, female    DOB: 03-11-1966, 52 y.o.   MRN: [id number]  Cough  This is a new problem. The current episode started 1 to 4 weeks ago. Associated symptoms include rhinorrhea. Pertinent negatives include no chest pain, ear pain, fever, shortness of breath or wheezing. Associated symptoms comments: Cough, runny nose, headache. Treatments tried: cefzil, zpack. Her past medical history is significant for COPD.   I have strongly encourages patient to quit smoking she has had a very difficult time doing so she will keep trying hard we have tried various measures  She relates a lot of coughing congestion bringing up phlegm she just started Z-Pak and she is recently taken a prednisone in the past and seemed to help she just got a prescription filled was starting prednisone today   Review of Systems  Constitutional: Negative for activity change and fever.  HENT: Positive for congestion and rhinorrhea. Negative for ear pain.   Eyes: Negative for discharge.  Respiratory: Positive for cough. Negative for shortness of breath and wheezing.   Cardiovascular: Negative for chest pain.       Objective:   Physical Exam  Constitutional: She appears well-developed.  HENT:  Head: Normocephalic.  Nose: Nose normal.  Mouth/Throat: Oropharynx is clear and moist. No oropharyngeal exudate.  Neck: Neck supple.  Cardiovascular: Normal rate and normal heart sounds.  No murmur heard. Pulmonary/Chest: Effort normal and breath sounds normal. She has no wheezes.  Lymphadenopathy:    She has no cervical adenopathy.  Skin: Skin is warm and dry.  Nursing note and vitals reviewed.  I do not hear any pneumonia I do not feel the patient is toxic I do not recommend x-rays or lab work at this time       Assessment & Plan:  Acute bronchitis with COPD flareup along with acute rhinosinusitis go ahead with the antibiotics as prescribed Prednisone taper Depo-Medrol  shot Albuterol as needed Quit smoking Follow-up if ongoing troubles or worse

## 2018-07-13 NOTE — Patient Instructions (Signed)
How to Use a Metered Dose Inhaler A metered dose inhaler is a handheld device for taking medicine that must be breathed into the lungs (inhaled). The device can be used to deliver a variety of inhaled medicines, including:  Quick relief or rescue medicines, such as bronchodilators.  Controller medicines, such as corticosteroids.  The medicine is delivered by pushing down on a metal canister to release a preset amount of spray and medicine. Each device contains the amount of medicine that is needed for a preset number of uses (inhalations). Your health care provider may recommend that you use a spacer with your inhaler to help you take the medicine more effectively. A spacer is a plastic tube with a mouthpiece on one end and an opening that connects to the inhaler on the other end. A spacer holds the medicine in a tube for a short time, which allows you to inhale more medicine. What are the risks? If you do not use your inhaler correctly, medicine might not reach your lungs to help you breathe. Inhaler medicine can cause side effects, such as:  Mouth or throat infection.  Cough.  Hoarseness.  Headache.  Nausea and vomiting.  Lung infection (pneumonia) in people who have a lung condition called COPD.  How to use a metered dose inhaler without a spacer 1. Remove the cap from the inhaler. 2. If you are using the inhaler for the first time, shake it for 5 seconds, turn it away from your face, then release 4 puffs into the air. This is called priming. 3. Shake the inhaler for 5 seconds. 4. Position the inhaler so the top of the canister faces up. 5. Put your index finger on the top of the medicine canister. Support the bottom of the inhaler with your thumb. 6. Breathe out normally and as completely as possible, away from the inhaler. 7. Either place the inhaler between your teeth and close your lips tightly around the mouthpiece, or hold the inhaler 1-2 inches (2.5-5 cm) away from your open  mouth. Keep your tongue down out of the way. If you are unsure which technique to use, ask your health care provider. 8. Press the canister down with your index finger to release the medicine, then inhale deeply and slowly through your mouth (not your nose) until your lungs are completely filled. Inhaling should take 4-6 seconds. 9. Hold the medicine in your lungs for 5-10 seconds (10 seconds is best). This helps the medicine get into the small airways of your lungs. 10. With your lips in a tight circle (pursed), breathe out slowly. 11. Repeat steps 3-10 until you have taken the number of puffs that your health care provider directed. Wait about 1 minute between puffs or as directed. 12. Put the cap on the inhaler. 13. If you are using a steroid inhaler, rinse your mouth with water, gargle, and spit out the water. Do not swallow the water. How to use a metered dose inhaler with a spacer 1. Remove the cap from the inhaler. 2. If you are using the inhaler for the first time, shake it for 5 seconds, turn it away from your face, then release 4 puffs into the air. This is called priming. 3. Shake the inhaler for 5 seconds. 4. Place the open end of the spacer onto the inhaler mouthpiece. 5. Position the inhaler so the top of the canister faces up and the spacer mouthpiece faces you. 6. Put your index finger on the top of the medicine canister.   Support the bottom of the inhaler and the spacer with your thumb. 7. Breathe out normally and as completely as possible, away from the spacer. 8. Place the spacer between your teeth and close your lips tightly around it. Keep your tongue down out of the way. 9. Press the canister down with your index finger to release the medicine, then inhale deeply and slowly through your mouth (not your nose) until your lungs are completely filled. Inhaling should take 4-6 seconds. 10. Hold the medicine in your lungs for 5-10 seconds (10 seconds is best). This helps the medicine  get into the small airways of your lungs. 11. With your lips in a tight circle (pursed), breathe out slowly. 12. Repeat steps 3-11 until you have taken the number of puffs that your health care provider directed. Wait about 1 minute between puffs or as directed. 13. Remove the spacer from the inhaler and put the cap on the inhaler. 14. If you are using a steroid inhaler, rinse your mouth with water, gargle, and spit out the water. Do not swallow the water. Follow these instructions at home:  Take your inhaled medicine only as told by your health care provider. Do not use the inhaler more than directed by your health care provider.  Keep all follow-up visits as told by your health care provider. This is important.  If your inhaler has a counter, you can check it to determine how full your inhaler is. If your inhaler does not have a counter, ask your health care provider when you will need to refill your inhaler and write the refill date on a calendar or on your inhaler canister. Note that you cannot know when an inhaler is empty by shaking it.  Follow directions on the package insert for care and cleaning of your inhaler and spacer. Contact a health care provider if:  Symptoms are only partially relieved with your inhaler.  You are having trouble using your inhaler.  You have an increase in phlegm.  You have headaches. Get help right away if:  You feel little or no relief after using your inhaler.  You have dizziness.  You have a fast heart rate.  You have chills or a fever.  You have night sweats.  There is blood in your phlegm. Summary  A metered dose inhaler is a handheld device for taking medicine that must be breathed into the lungs (inhaled).  The medicine is delivered by pushing down on a metal canister to release a preset amount of spray and medicine.  Each device contains the amount of medicine that is needed for a preset number of uses (inhalations). This  information is not intended to replace advice given to you by your health care provider. Make sure you discuss any questions you have with your health care provider. Document Released: 07/26/2005 Document Revised: 06/15/2016 Document Reviewed: 06/15/2016 Elsevier Interactive Patient Education  2017 [employer] Inc.  

## 2018-07-24 ENCOUNTER — Other Ambulatory Visit: Payer: Self-pay | Admitting: Family Medicine

## 2018-07-24 ENCOUNTER — Telehealth: Payer: Self-pay | Admitting: Family Medicine

## 2018-07-24 MED ORDER — CEFDINIR 300 MG PO CAPS: 300.0000 mg | ORAL_CAPSULE | Freq: Two times a day (BID) | ORAL | 0 refills | Status: DC

## 2018-07-24 NOTE — Telephone Encounter (Signed)
Omnicef 300 mg 1 twice daily for 10 days If ongoing trouble please notify us or follow-up

## 2018-07-24 NOTE — Telephone Encounter (Signed)
Please advise. Thank you

## 2018-07-24 NOTE — Telephone Encounter (Signed)
Pt was seen on Dec 5th she is feeling better but her sinuses are still bothering her. She still has green mucus. She is hoping an antibiotic can be called in to [place], [place]

## 2018-07-24 NOTE — Telephone Encounter (Signed)
Medication sent in and patient is aware.  

## 2018-08-08 ENCOUNTER — Telehealth: Payer: Self-pay | Admitting: *Deleted

## 2018-08-08 NOTE — Telephone Encounter (Signed)
FYI:  Patient was in [facility] file for a pre and post pulmonary function test for COPD follow up Patient was a no show for her appointment 07/12/18 and did not reschedule

## 2018-08-10 ENCOUNTER — Encounter: Payer: Self-pay | Admitting: Family Medicine

## 2018-08-10 NOTE — Telephone Encounter (Signed)
I dictated a letter for the patient to get this test completed we will send this letter to the patient

## 2018-08-11 NOTE — Telephone Encounter (Signed)
Letter mailed out today.

## 2018-08-23 ENCOUNTER — Encounter: Payer: Self-pay | Admitting: Family Medicine

## 2018-08-23 ENCOUNTER — Ambulatory Visit: Payer: BC Managed Care – PPO | Admitting: Family Medicine

## 2018-08-23 VITALS — BP 106/68 | Temp 99.0°F | Ht 62.0 in | Wt 97.6 lb

## 2018-08-23 DIAGNOSIS — J019 Acute sinusitis, unspecified: Secondary | ICD-10-CM

## 2018-08-23 MED ORDER — CEFPROZIL 500 MG PO TABS: 500.0000 mg | ORAL_TABLET | Freq: Two times a day (BID) | ORAL | 0 refills | Status: DC

## 2018-08-23 NOTE — Progress Notes (Signed)
   Subjective:    Patient ID: Isabel Atkinson, female    DOB: 04/13/1966, 53 y.o.   MRN: [id number]  Sinusitis  This is a new problem. Episode onset: 10 days  Associated symptoms include coughing. (Runny nose)  cond g and drange  Bad cough some productive   ffrontal and max tend and pressure    Gunky disch   No sore throat   No stoach symtoms        Review of Systems  Respiratory: Positive for cough.        Objective:   Physical Exam  Alert, mild malaise. Hydration good Vitals stable. frontal/ maxillary tenderness evident positive nasal congestion. pharynx normal neck supple  lungs clear/no crackles or wheezes. heart regular in rhythm       Assessment & Plan:  Impression rhinosinusitis likely post viral, discussed with patient. plan antibiotics prescribed. Questions answered. Symptomatic care discussed. warning signs discussed. WSL

## 2018-08-30 ENCOUNTER — Telehealth: Payer: Self-pay | Admitting: Family Medicine

## 2018-08-30 ENCOUNTER — Encounter: Payer: Self-pay | Admitting: Family Medicine

## 2018-08-30 NOTE — Telephone Encounter (Signed)
Please go ahead and give work excuse thank you

## 2018-08-30 NOTE — Telephone Encounter (Signed)
Patient is requesting work note for stomach virus for 1/22 and 1/23 returning to work 1/24 if feeling better might go back tomorrow.

## 2018-08-30 NOTE — Telephone Encounter (Signed)
Work excuse was picked up by daughter Teacher, music today.

## 2018-09-06 ENCOUNTER — Telehealth: Payer: Self-pay | Admitting: Family Medicine

## 2018-09-06 ENCOUNTER — Other Ambulatory Visit: Payer: Self-pay | Admitting: *Deleted

## 2018-09-06 MED ORDER — DOXYCYCLINE HYCLATE 100 MG PO TABS: 100.0000 mg | ORAL_TABLET | Freq: Two times a day (BID) | ORAL | 0 refills | Status: DC

## 2018-09-06 NOTE — Telephone Encounter (Signed)
Patient was seen on the Jan. 15th for a sinus infection and put on Cefzil.  She said she had a stomach bug and had to stop taking the meds for 3 days then started back up again.  Took her last dose Monday but still has the same symptoms and wants to get another round of antibiotics.  [place]

## 2018-09-06 NOTE — Telephone Encounter (Signed)
Med sent to pharm. Left message to return call  

## 2018-09-06 NOTE — Telephone Encounter (Signed)
Doxycycline 100 mg twice daily 7 days follow-up if ongoing troubles

## 2018-09-06 NOTE — Telephone Encounter (Signed)
Patient states she is running a low grade fever of 99,sinus congestion and cough with green mucus.Finished the antibiotic on Monday. Wants another antibx sent in please advise.

## 2018-09-07 NOTE — Telephone Encounter (Signed)
Pt.notified

## 2018-09-07 NOTE — Telephone Encounter (Signed)
I called and left a message to r/c. 

## 2018-09-09 ENCOUNTER — Other Ambulatory Visit: Payer: Self-pay | Admitting: Adult Health

## 2018-09-11 NOTE — Telephone Encounter (Signed)
One refill , needs to make appt with cardiologist for more refills.

## 2018-09-14 ENCOUNTER — Telehealth: Payer: Self-pay | Admitting: Family Medicine

## 2018-09-14 DIAGNOSIS — R0600 Dyspnea, unspecified: Secondary | ICD-10-CM

## 2018-09-14 DIAGNOSIS — J449 Chronic obstructive pulmonary disease, unspecified: Secondary | ICD-10-CM

## 2018-09-14 DIAGNOSIS — Z87891 Personal history of nicotine dependence: Secondary | ICD-10-CM

## 2018-09-14 NOTE — Telephone Encounter (Signed)
I called to make sure pt not having any current problems. She states no she was supposed to have this done this year.

## 2018-09-14 NOTE — Telephone Encounter (Signed)
Pt would like a lung function test ordered and scheduled for the end of the month of February.

## 2018-09-15 NOTE — Telephone Encounter (Signed)
Pt.notified

## 2018-09-15 NOTE — Telephone Encounter (Signed)
Please order pulmonary function testing pre-and post because of dyspnea and history of smoking

## 2018-09-15 NOTE — Telephone Encounter (Signed)
pft pre and post order put in. Someone from [facility] should call pt to schedule test

## 2018-09-29 ENCOUNTER — Other Ambulatory Visit: Payer: Self-pay

## 2018-09-29 MED ORDER — AZITHROMYCIN 250 MG PO TABS: ORAL_TABLET | ORAL | 0 refills | Status: DC

## 2018-10-11 ENCOUNTER — Ambulatory Visit: Payer: BC Managed Care – PPO | Admitting: Family Medicine

## 2018-10-11 ENCOUNTER — Encounter: Payer: Self-pay | Admitting: Family Medicine

## 2018-10-11 VITALS — Temp 99.1°F | Wt 97.2 lb

## 2018-10-11 DIAGNOSIS — J020 Streptococcal pharyngitis: Secondary | ICD-10-CM

## 2018-10-11 DIAGNOSIS — J029 Acute pharyngitis, unspecified: Secondary | ICD-10-CM

## 2018-10-11 LAB — POCT RAPID STREP A (OFFICE): Rapid Strep A Screen: NEGATIVE

## 2018-10-11 MED ORDER — CEFDINIR 300 MG PO CAPS: ORAL_CAPSULE | ORAL | 0 refills | Status: DC

## 2018-10-11 NOTE — Progress Notes (Signed)
   Subjective:    Patient ID: Isabel Atkinson, female    DOB: [birth year], 53 y.o.   MRN: [id number]  Sore Throat   This is a new problem. The current episode started in the past 7 days. She has had exposure to strep.  Pt states her son was diagnosed with strep.   Results for orders placed or performed in visit on 05/15/18  POCT urinalysis dipstick  Result Value Ref Range   Color, UA     Clarity, UA     Glucose, UA     Bilirubin, UA     Ketones, UA     Spec Grav, UA 1.010 1.010 - 1.025   Blood, UA     pH, UA 6.0 5.0 - 8.0   Protein, UA     Urobilinogen, UA     Nitrite, UA     Leukocytes, UA     Appearance     Odor    low gr fever, energy leel down soewhat   Multiple instances of strep throat within the family progressive sore throat.  Low-grade fever intermittently.  Energy level overall low    Review of Systems No headache, no major weight loss or weight gain, no chest pain no back pain abdominal pain no change in bowel habits complete ROS otherwise negative     Objective:   Physical Exam  Alert active good hydration mild nasal congestion pharynx erythematous neck supple lungs are but heart regular rate tender anterior nodes of the neck.      Assessment & Plan:  Impression probable strep.  Discussed.  Rapid screen not always reliable.  To confirm test within the family prudent to treat.  Rationale discussed  Greater than 50% of this 15 minute face to face visit was spent in counseling and discussion and coordination of care regarding the above diagnosis/diagnosies

## 2018-10-18 ENCOUNTER — Ambulatory Visit (HOSPITAL_COMMUNITY)
Admission: RE | Admit: 2018-10-18 | Discharge: 2018-10-18 | Disposition: A | Discharge status: Home / Self Care | Payer: BC Managed Care – PPO | Source: Ambulatory Visit | Attending: Family Medicine | Admitting: Family Medicine

## 2018-10-18 ENCOUNTER — Other Ambulatory Visit: Payer: Self-pay

## 2018-10-18 DIAGNOSIS — R0600 Dyspnea, unspecified: Secondary | ICD-10-CM | POA: Diagnosis not present

## 2018-10-18 DIAGNOSIS — Z87891 Personal history of nicotine dependence: Secondary | ICD-10-CM | POA: Insufficient documentation

## 2018-10-18 DIAGNOSIS — J449 Chronic obstructive pulmonary disease, unspecified: Secondary | ICD-10-CM

## 2018-10-18 LAB — PULMONARY FUNCTION TEST
DL/VA % pred: 95 %
DL/VA: 4.14 ml/min/mmHg/L
DLCO UNC: 14.44 ml/min/mmHg
DLCO unc % pred: 74 %
FEF 25-75 Post: 1.41 L/sec
FEF 25-75 Pre: 1.06 L/sec
FEF2575-%Change-Post: 32 %
FEF2575-%Pred-Post: 56 %
FEF2575-%Pred-Pre: 42 %
FEV1-%Change-Post: 7 %
FEV1-%Pred-Post: 70 %
FEV1-%Pred-Pre: 65 %
FEV1-Post: 1.78 L
FEV1-Pre: 1.65 L
FEV1FVC-%Change-Post: 4 %
FEV1FVC-%Pred-Pre: 89 %
FEV6-%Change-Post: 3 %
FEV6-%Pred-Post: 76 %
FEV6-%Pred-Pre: 73 %
FEV6-Post: 2.39 L
FEV6-Pre: 2.3 L
FEV6FVC-%CHANGE-POST: 1 %
FEV6FVC-%Pred-Post: 102 %
FEV6FVC-%Pred-Pre: 101 %
FVC-%Change-Post: 2 %
FVC-%Pred-Post: 74 %
FVC-%Pred-Pre: 72 %
FVC-Post: 2.4 L
FVC-Pre: 2.34 L
PRE FEV1/FVC RATIO: 71 %
Post FEV1/FVC ratio: 74 %
Post FEV6/FVC ratio: 100 %
Pre FEV6/FVC Ratio: 99 %
RV % pred: 111 %
RV: 1.97 L
TLC % pred: 89 %
TLC: 4.24 L

## 2018-10-18 MED ORDER — ALBUTEROL SULFATE (2.5 MG/3ML) 0.083% IN NEBU
2.5000 mg | INHALATION_SOLUTION | Freq: Once | RESPIRATORY_TRACT | Status: AC
  Administered 2018-10-18: 2.5 mg via RESPIRATORY_TRACT

## 2018-10-27 ENCOUNTER — Telehealth: Payer: Self-pay | Admitting: Family Medicine

## 2018-10-27 MED ORDER — DOXYCYCLINE HYCLATE 100 MG PO TABS: ORAL_TABLET | ORAL | 0 refills | Status: DC

## 2018-10-27 NOTE — Telephone Encounter (Signed)
Patient states she is experiencing low grade fever of 99.4, cough, and sinus pressure.   Has not traveled internationally in past 30 days.  Has no been around around anyone possible for COVID-19 or sick that she's aware of.   Pharmacy:  [place], [place]

## 2018-10-27 NOTE — Telephone Encounter (Signed)
Started coughing Tuesday, low grade fever yesterday (99.4) no trouble breathing, coughing up clear mucus. Cough is worse at night. Taking mucinex.   [place]

## 2018-10-27 NOTE — Telephone Encounter (Signed)
Patient is aware of all. 

## 2018-10-27 NOTE — Telephone Encounter (Signed)
Doxycycline 100 mg twice daily for 7 days If progressive trouble she may need to seek help if she gets severely short of breath or other problems otherwise I would remain at home on antibiotics use albuterol PRN  Educate the patient regarding warning signs-shortness of breath wheezing difficulty breathing etc. go to ER if these occur

## 2018-11-21 ENCOUNTER — Ambulatory Visit (INDEPENDENT_AMBULATORY_CARE_PROVIDER_SITE_OTHER): Payer: BC Managed Care – PPO | Admitting: Family Medicine

## 2018-11-21 ENCOUNTER — Other Ambulatory Visit: Payer: Self-pay

## 2018-11-21 ENCOUNTER — Encounter: Payer: Self-pay | Admitting: Family Medicine

## 2018-11-21 DIAGNOSIS — N3000 Acute cystitis without hematuria: Secondary | ICD-10-CM | POA: Diagnosis not present

## 2018-11-21 MED ORDER — NITROFURANTOIN MONOHYD MACRO 100 MG PO CAPS: 100.0000 mg | ORAL_CAPSULE | Freq: Two times a day (BID) | ORAL | 0 refills | Status: DC

## 2018-11-21 NOTE — Addendum Note (Signed)
Addended by: Dairl Ponder on: 11/21/2018 09:21 AM   Modules accepted: Orders

## 2018-11-21 NOTE — Progress Notes (Signed)
   Subjective:    Patient ID: Isabel Atkinson, female    DOB: 1965/08/31, 53 y.o.   MRN: [id number] Telephone only HPI  Patient calls with dysuria for 3 days. Patient feels like she has an UTI.  Virtual Visit via Video Note  I connected with Isabel Atkinson on 11/21/18 at  9:30 AM EDT by a video enabled telemedicine application and verified that I am speaking with the correct person using two identifiers.   I discussed the limitations of evaluation and management by telemedicine and the availability of in person appointments. The patient expressed understanding and agreed to proceed.  History of Present Illness:    Observations/Objective:   Assessment and Plan:   Follow Up Instructions:    I discussed the assessment and treatment plan with the patient. The patient was provided an opportunity to ask questions and all were answered. The patient agreed with the plan and demonstrated an understanding of the instructions.   The patient was advised to call back or seek an in-person evaluation if the symptoms worsen or if the condition fails to improve as anticipated.  I provided 15 minutes of non-face-to-face time during this encounter.  First noticed low bac k pain   Increased freq   Pos dysuria  Used azo helped some    Review of Systems No fever no vom no diarrhea    Objective:   Physical Exam   Virtual visit     Assessment & Plan:  Impression probable UTI.  First and 1 m.  Symptom care discussed.  Warning signs discussed.  Interventions discussed will prescribe Macrobid 100 twice daily for 5 days.  Symptom care discussed  Greater than 50% of this 15 minute face to face visit was spent in counseling and discussion and coordination of care regarding the above diagnosis/diagnosies

## 2018-12-08 ENCOUNTER — Other Ambulatory Visit: Payer: Self-pay | Admitting: Gastroenterology

## 2018-12-08 ENCOUNTER — Other Ambulatory Visit: Payer: Self-pay | Admitting: Adult Health

## 2018-12-08 NOTE — Telephone Encounter (Signed)
Metoprolol succ 25 mg refilled. 

## 2018-12-12 ENCOUNTER — Other Ambulatory Visit: Payer: Self-pay | Admitting: Adult Health

## 2018-12-12 NOTE — Telephone Encounter (Signed)
Metoprolol succ 25 mg refilled. 

## 2019-01-10 ENCOUNTER — Other Ambulatory Visit: Payer: Self-pay | Admitting: Family Medicine

## 2019-04-12 ENCOUNTER — Telehealth: Payer: Self-pay | Admitting: Family Medicine

## 2019-04-12 NOTE — Telephone Encounter (Signed)
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 Does she need to worry about this? No symptoms.

## 2019-04-12 NOTE — Telephone Encounter (Signed)
Left message to return call 

## 2019-04-13 NOTE — Telephone Encounter (Signed)
It is not unusual for them to do a dipstick urine which can show blood when blood may not be present but this is a concerning issue that needs further looking into I would recommend office visit with complete urinalysis so that we can look to make sure there is no blood in her urine because if there is other testing would need to be completed-it is best to be thorough Please set her up for next week

## 2019-04-13 NOTE — Telephone Encounter (Signed)
I called patient and set her up for an appt to recheck urine.

## 2019-04-25 ENCOUNTER — Ambulatory Visit (INDEPENDENT_AMBULATORY_CARE_PROVIDER_SITE_OTHER): Payer: BC Managed Care – PPO | Admitting: Family Medicine

## 2019-04-25 ENCOUNTER — Encounter: Payer: Self-pay | Admitting: Family Medicine

## 2019-04-25 ENCOUNTER — Other Ambulatory Visit: Payer: Self-pay

## 2019-04-25 VITALS — BP 102/70 | Temp 98.1°F | Ht 62.0 in | Wt 99.0 lb

## 2019-04-25 DIAGNOSIS — Z23 Encounter for immunization: Secondary | ICD-10-CM | POA: Diagnosis not present

## 2019-04-25 DIAGNOSIS — R319 Hematuria, unspecified: Secondary | ICD-10-CM | POA: Diagnosis not present

## 2019-04-25 DIAGNOSIS — J432 Centrilobular emphysema: Secondary | ICD-10-CM

## 2019-04-25 LAB — POCT URINALYSIS DIPSTICK
Spec Grav, UA: <=1.005 — AB (ref 1.010–1.025)
pH, UA: 7 (ref 5.0–8.0)

## 2019-04-25 NOTE — Progress Notes (Signed)
Subjective:    Patient ID: Isabel Atkinson, female    DOB: [birth year], 53 y.o.   MRN: [id number]  [facility] a DOT physical and they found some blood in her urine. Not having any urinary symptoms.  Patient was not having any urinary symptoms but she was told that her urine showed blood she is uncertain if this was microscopic or just on the dipstick she denies ever seeing blood. Wants to get her back checked. Worried she is going to be humped over.   Left ear pain. Started 2 hours ago burning.  Relates some intermittent left ear pain denies head congestion drainage coughing Wants flu vaccine today.   Results for orders placed or performed in visit on 04/25/19  POCT urinalysis dipstick  Result Value Ref Range   Color, UA     Clarity, UA     Glucose, UA     Bilirubin, UA     Ketones, UA     Spec Grav, UA <=1.005 (A) 1.010 - 1.025   Blood, UA     pH, UA 7.0 5.0 - 8.0   Protein, UA     Urobilinogen, UA     Nitrite, UA     Leukocytes, UA     Appearance     Odor     She denies ever having hematuria she does smoke  We did discuss her lung disease she has COPD is highly recommended for this patient quit smoking although it is been very difficult for her to do so    Review of Systems  Constitutional: Negative for activity change, appetite change and fatigue.  HENT: Negative for congestion and rhinorrhea.   Respiratory: Negative for cough and shortness of breath.   Cardiovascular: Negative for chest pain and leg swelling.  Gastrointestinal: Negative for abdominal pain and diarrhea.  Endocrine: Negative for polydipsia and polyphagia.  Genitourinary: Negative for dysuria, frequency and hematuria.  Skin: Negative for color change.  Neurological: Negative for dizziness and weakness.  Psychiatric/Behavioral: Negative for behavioral problems and confusion.       Objective:   Physical Exam Vitals signs reviewed.  Constitutional:      General: She is not in acute distress. HENT:      Head: Normocephalic and atraumatic.     Right Ear: Tympanic membrane normal.     Left Ear: Tympanic membrane normal.  Eyes:     General:        Right eye: No discharge.        Left eye: No discharge.  Neck:     Trachea: No tracheal deviation.  Cardiovascular:     Rate and Rhythm: Normal rate and regular rhythm.     Heart sounds: Normal heart sounds. No murmur.  Pulmonary:     Effort: Pulmonary effort is normal. No respiratory distress.     Breath sounds: Normal breath sounds.  Lymphadenopathy:     Cervical: No cervical adenopathy.  Skin:    General: Skin is warm and dry.  Neurological:     Mental Status: She is alert.     Coordination: Coordination normal.  Psychiatric:        Behavior: Behavior normal.           Assessment & Plan:  COPD I did review her pulmonary function test does show a marginal decline compared to previous very important for this patient quit smoking if she is unable to do so on her own we can try patches she is tried other measures without  success  Hematuria I looked at her urine under the microscope and also did dipstick neither 1 showed blood I highly recommend for this patient on her next visit to submit a urine specimen for a second test evaluation

## 2019-06-08 ENCOUNTER — Other Ambulatory Visit: Payer: Self-pay

## 2019-06-08 DIAGNOSIS — Z20822 Contact with and (suspected) exposure to covid-19: Secondary | ICD-10-CM

## 2019-06-10 LAB — NOVEL CORONAVIRUS, NAA: SARS-CoV-2, NAA: NOT DETECTED

## 2019-06-15 ENCOUNTER — Other Ambulatory Visit: Payer: Self-pay | Admitting: Adult Health

## 2019-06-26 ENCOUNTER — Ambulatory Visit (INDEPENDENT_AMBULATORY_CARE_PROVIDER_SITE_OTHER): Payer: BC Managed Care – PPO | Admitting: Family Medicine

## 2019-06-26 ENCOUNTER — Other Ambulatory Visit: Payer: Self-pay | Admitting: *Deleted

## 2019-06-26 ENCOUNTER — Other Ambulatory Visit: Payer: Self-pay

## 2019-06-26 DIAGNOSIS — J019 Acute sinusitis, unspecified: Secondary | ICD-10-CM | POA: Diagnosis not present

## 2019-06-26 DIAGNOSIS — Z20822 Contact with and (suspected) exposure to covid-19: Secondary | ICD-10-CM

## 2019-06-26 MED ORDER — CEFPROZIL 500 MG PO TABS: ORAL_TABLET | ORAL | 0 refills | Status: DC

## 2019-06-26 NOTE — Progress Notes (Signed)
   Subjective:    Patient ID: Isabel Atkinson, female    DOB: 15-Jul-1966, 53 y.o.   MRN: [id number]  Cough This is a new problem. Episode onset: Sunday  The cough is productive of sputum. Associated symptoms comments: Runny nose, facial pain, top teeth pain. Treatments tried: Mucinex. The treatment provided mild relief.   Virtual Visit via Telephone Note  I connected with Isabel Atkinson on 06/26/19 at  3:50 PM EST by telephone and verified that I am speaking with the correct person using two identifiers.  Location: Patient: home Provider: office   I discussed the limitations, risks, security and privacy concerns of performing an evaluation and management service by telephone and the availability of in person appointments. I also discussed with the patient that there may be a patient responsible charge related to this service. The patient expressed understanding and agreed to proceed.   History of Present Illness:    Observations/Objective:   Assessment and Plan:   Follow Up Instructions:    I discussed the assessment and treatment plan with the patient. The patient was provided an opportunity to ask questions and all were answered. The patient agreed with the plan and demonstrated an understanding of the instructions.   The patient was advised to call back or seek an in-person evaluation if the symptoms worsen or if the condition fails to improve as anticipated.  I provided 20 minutes of non-face-to-face time during this encounter.   Vicente Males, LPN  Patient recently had a COVID-19 test.  Negative.  Patient feels this is one of her classic sinus infections.  Gets a fall allergies.  She is progressing to frontal and facial pain.  Going into her teeth.  Typical for her sinusitis.  No shortness of breath no fever no cough from the chest no throat  Review of Systems  Respiratory: Positive for cough.        Objective:   Physical Exam  Virtual      Assessment  & Plan:  Impression probable rhinosinusitis discussed antibiotics prescribed symptom care discussed warning signs discussed

## 2019-06-27 ENCOUNTER — Encounter: Payer: Self-pay | Admitting: Family Medicine

## 2019-06-28 ENCOUNTER — Encounter: Payer: Self-pay | Admitting: Family Medicine

## 2019-06-28 LAB — NOVEL CORONAVIRUS, NAA: SARS-CoV-2, NAA: NOT DETECTED

## 2019-07-13 ENCOUNTER — Telehealth: Payer: Self-pay | Admitting: Family Medicine

## 2019-07-13 MED ORDER — DOXYCYCLINE HYCLATE 100 MG PO TABS: 100.0000 mg | ORAL_TABLET | Freq: Two times a day (BID) | ORAL | 0 refills | Status: DC

## 2019-07-13 NOTE — Telephone Encounter (Signed)
Prescription sent electronically to pharmacy. Patient aware. 

## 2019-07-13 NOTE — Telephone Encounter (Signed)
May try doxycycline 100 mg 1 tablet twice daily 10 days Follow-up if ongoing Use humidifier at home if possible

## 2019-07-13 NOTE — Telephone Encounter (Signed)
Pt states she finished the antibiotic that Dr. Richardson Landry ordered for her & she's still having sinus symptoms, would like another antibiotic called in  Please advise & call pt    [facility]

## 2019-07-23 ENCOUNTER — Other Ambulatory Visit: Payer: Self-pay | Admitting: Adult Health

## 2019-07-26 ENCOUNTER — Other Ambulatory Visit: Payer: Self-pay | Admitting: Adult Health

## 2019-07-26 NOTE — Telephone Encounter (Signed)
Please review for refill. Thanks!  

## 2019-08-23 ENCOUNTER — Other Ambulatory Visit: Payer: Self-pay

## 2019-08-23 ENCOUNTER — Ambulatory Visit: Payer: BC Managed Care – PPO | Attending: Internal Medicine

## 2019-08-23 DIAGNOSIS — Z20822 Contact with and (suspected) exposure to covid-19: Secondary | ICD-10-CM

## 2019-08-24 ENCOUNTER — Other Ambulatory Visit: Payer: BC Managed Care – PPO

## 2019-08-24 LAB — NOVEL CORONAVIRUS, NAA: SARS-CoV-2, NAA: NOT DETECTED

## 2019-08-29 ENCOUNTER — Other Ambulatory Visit: Payer: Self-pay | Admitting: Internal Medicine

## 2019-08-30 ENCOUNTER — Other Ambulatory Visit: Payer: Self-pay | Admitting: Internal Medicine

## 2019-08-30 MED ORDER — METOPROLOL SUCCINATE ER 25 MG PO TB24: 12.5000 mg | ORAL_TABLET | Freq: Every day | ORAL | 0 refills | Status: DC

## 2019-08-30 NOTE — Telephone Encounter (Signed)
Apt made with Dr.Ross 09/07/2019

## 2019-08-30 NOTE — Telephone Encounter (Signed)
This is a [place] pt that has not been seen since 2017. Please address

## 2019-08-30 NOTE — Telephone Encounter (Signed)
*  STAT* If patient is at the pharmacy, call can be transferred to refill team.   1. Which medications need to be refilled? (please list name of each medication and dose if known) Metoprolol  2. Which pharmacy/location (including street and city if local pharmacy) is medication to be sent to? [facility]  3. Do they need a 30 day or 90 day supply? 90 days and refills

## 2019-09-05 NOTE — Progress Notes (Signed)
Cardiology Office Note   Date:  09/07/2019   ID:  Isabel Atkinson, DOB 1966/06/07, MRN [id number]  PCP:  Kathyrn Drown, MD  Cardiologist:  Zaylon Bossier/ Jory Sims NP   Pt presents for f/u of palpitations    History of Present Illness: Isabel Atkinson is a 54 y.o. female with a history of palpitations, COPD, GERD, and neuropathy.  Event monitor was neg for arrhythmia, Myoview was normal. Saw neurologist, Dr. Jannifer Franklin for nerve conduction test.   Nerve conduction studies done on both upper extremities and on the left lower extremity were within normal limits. There is no evidence of a peripheral neuropathy.   I saw the pt in 2017    Since seen she has done well   She needs refills on Toprol.  She denies palpitations   No CP   No SOB  No dizzinesss  No edema   She is active   WOrks in cafeteria  Outpatient Medications Prior to Visit  Medication Sig Dispense Refill  . ALPRAZolam (XANAX) 0.25 MG tablet TAKE 1/2 TO 1 TABLET BY MOUTH TWICE DAILY AS NEEDED FOR ANXIETY. 30 tablet 2  . Bioflavonoid Products (ESTER C PO) Take 1 tablet by mouth daily.     . Calcium-Vitamin D-Vitamin K (CALCIUM SOFT CHEWS) 500-500-40 MG-UNT-MCG CHEW Chew 1 each by mouth 2 (two) times daily.     . cefPROZIL (CEFZIL) 500 MG tablet Take one tablet po BID for 10 days 20 tablet 0  . cycloSPORINE (RESTASIS) 0.05 % ophthalmic emulsion Place 1 drop into both eyes 2 (two) times daily.     Marland Kitchen doxycycline (VIBRA-TABS) 100 MG tablet Take 1 tablet (100 mg total) by mouth 2 (two) times daily. 20 tablet 0  . ibuprofen (ADVIL,MOTRIN) 600 MG tablet Take 1 tablet (600 mg total) by mouth 3 (three) times daily. 30 tablet 1  . Multiple Vitamins-Minerals (MULTIVITAMIN WITH MINERALS) tablet Take 1 tablet by mouth daily. Gummy Vitamin    . omeprazole (PRILOSEC) 40 MG capsule TAKE ONE CAPSULE BY MOUTH ONCE DAILY. 30 capsule 5  . Triamcinolone Acetonide (NASACORT ALLERGY 24HR NA) Place 2 sprays into the nose daily.    . metoprolol  succinate (TOPROL-XL) 25 MG 24 hr tablet Take 0.5 tablets (12.5 mg total) by mouth daily. 15 tablet 0   No facility-administered medications prior to visit.     Allergies:   Ciprofloxacin, Augmentin [amoxicillin-pot clavulanate], Carafate [sucralfate], Celexa [citalopram hydrobromide], Chantix [varenicline], Sulfa antibiotics, Wellbutrin [bupropion], and Fosamax [alendronate sodium]   Past Medical History:  Diagnosis Date  . Anxiety   . Pre-diabetes   . Reactive airway disease   . Tachycardia     Past Surgical History:  Procedure Laterality Date  . CATARACT EXTRACTION W/PHACO Left 01/17/2017   Procedure: CATARACT EXTRACTION PHACO AND INTRAOCULAR LENS PLACEMENT (IOC);  Surgeon: Tonny Branch, MD;  Location: [facility] ORS;  Service: Ophthalmology;  Laterality: Left;  CDE: 6.92  . COLONOSCOPY  2013   RMR: Normal rectum. single diminutive sigmoid polyp noted above. Normal terminal ileum. Status post segmental biopsy. next TCS in 10 years.   . ESOPHAGOGASTRODUODENOSCOPY  2013   RMR: Possible cervical esophageal web-status post dilation as described above. Small hiatal hernia. Status post biopsy of normal appearing small bowel to screen for celiac disease.   . ESOPHAGOGASTRODUODENOSCOPY N/A 05/06/2016   Procedure: ESOPHAGOGASTRODUODENOSCOPY (EGD);  Surgeon: Daneil Dolin, MD;  Location: [facility] SU[facility];  Service: Endoscopy;  Laterality: N/A;  3:00 pm - pt knows to arrive at  2:[place] N/A 05/06/2016   Procedure: Venia Minks DILATION;  Surgeon: Daneil Dolin, MD;  Location: [facility];  Service: Endoscopy;  Laterality: N/A;  . NECK SURGERY  10/2012  . OVARIAN CYST SURGERY  1997   removed  . TUBAL LIGATION  2000     Social History:  The patient  reports that she has been smoking cigarettes. She has a 25.00 pack-year smoking history. She has never used smokeless tobacco. She reports that she does not drink alcohol or use drugs.   Family History:  The patient's family history includes  Colon cancer (age of onset: 32) in her maternal uncle; Colon cancer (age of onset: 71) in her maternal grandfather; Colon polyps (age of onset: 49) in her mother; Diabetes in her father; Heart disease in her father; Hyperlipidemia in her father; Hypertension in her father.    ROS:  Please see the history of present illness. All other systems are reviewed and  Negative to the above problem except as noted.    PHYSICAL EXAM: VS:  BP 137/70   Pulse 73   Temp (!) 96.8 F (36 C)   Ht 5\' 2"  (1.575 m)   Wt 102 lb 12.8 oz (46.6 kg)   SpO2 97%   BMI 18.80 kg/m   GEN: Thin 54 yo  in no acute distress  HEENT: normal  Neck: no JVD, carotid bruits Cardiac: RRR; no murmurs, rubs, or gallops,no edema  Respiratory:  clear to auscultation bilaterally, normal work of breathing GI: soft, nontender, nondistended, + BS  No hepatomegaly  MS: no deformity Moving all extremities   Skin: warm and dry, no rash Neuro:  Strength and sensation are intact Psych: euthymic mood, full affect    Lipid Panel    Component Value Date/Time   CHOL 200 (H) 12/01/2017 0922   TRIG 69 12/01/2017 0922   HDL 75 12/01/2017 0922   CHOLHDL 2.7 12/01/2017 0922   CHOLHDL 2.5 11/03/2013 0951   VLDL 13 11/03/2013 0951   LDLCALC 111 (H) 12/01/2017 0922      Wt Readings from Last 3 Encounters:  09/07/19 102 lb 12.8 oz (46.6 kg)  04/25/19 99 lb (44.9 kg)  10/11/18 97 lb 3.2 oz (44.1 kg)    EKG   Not done today   ASSESSMENT AND PLAN:  1. Palpitations: Pt denies  sympotms   Monitor in past was not impressive for signif arrhythmia She could try tapering to 1/4 tab daily for a few wks the qod for a month then dc  Restart if resume   Stay hydrated    2. Ongoing tobacco abuse: Pt still smoking a pack per day  Counselled on cessation   Strongly encourage her to      F/U will be prn      Current medicines are reviewed at length with the patient today.  The patient does not have concerns regarding  medicines.  Signed, Dorris Carnes, MD  09/07/2019 2:43 PM    [address], [place], [place]  [address] Phone: [phone]; Fax: [phone]

## 2019-09-07 ENCOUNTER — Encounter: Payer: Self-pay | Admitting: Internal Medicine

## 2019-09-07 ENCOUNTER — Ambulatory Visit: Payer: BC Managed Care – PPO | Admitting: Internal Medicine

## 2019-09-07 ENCOUNTER — Other Ambulatory Visit: Payer: Self-pay

## 2019-09-07 VITALS — BP 137/70 | HR 73 | Temp 96.8°F | Ht 62.0 in | Wt 102.8 lb

## 2019-09-07 DIAGNOSIS — R002 Palpitations: Secondary | ICD-10-CM

## 2019-09-07 DIAGNOSIS — Z720 Tobacco use: Secondary | ICD-10-CM

## 2019-09-07 MED ORDER — METOPROLOL SUCCINATE ER 25 MG PO TB24: 12.5000 mg | ORAL_TABLET | Freq: Every day | ORAL | 3 refills | Status: DC

## 2019-09-07 NOTE — Patient Instructions (Signed)
Medication Instructions:  Your physician recommends that you continue on your current medications as directed. Please refer to the Current Medication list given to you today.  *If you need a refill on your cardiac medications before your next appointment, please call your pharmacy*  Lab Work: NONE   If you have labs (blood work) drawn today and your tests are completely normal, you will receive your results only by: Marland Kitchen MyChart Message (if you have MyChart) OR . A paper copy in the mail If you have any lab test that is abnormal or we need to change your treatment, we will call you to review the results.  Testing/Procedures: NONE   Follow-Up: At [facility], you and your health needs are our priority.  As part of our continuing mission to provide you with exceptional heart care, we have created designated Provider Care Teams.  These Care Teams include your primary Cardiologist (physician) and Advanced Practice Providers (APPs -  Physician Assistants and Nurse Practitioners) who all work together to provide you with the care you need, when you need it.  Your next appointment:    As Needed   The format for your next appointment:   Either In Person or Virtual  Provider:   Dorris Carnes, MD  Other Instructions Thank you for choosing [facility]!

## 2019-09-18 ENCOUNTER — Other Ambulatory Visit: Payer: Self-pay | Admitting: Family Medicine

## 2019-09-19 NOTE — Telephone Encounter (Signed)
10/19/17 was last wellness. No med check since then

## 2019-10-08 ENCOUNTER — Other Ambulatory Visit: Payer: BC Managed Care – PPO

## 2019-10-09 ENCOUNTER — Ambulatory Visit: Payer: BC Managed Care – PPO | Attending: Internal Medicine

## 2019-10-09 ENCOUNTER — Other Ambulatory Visit: Payer: Self-pay

## 2019-10-09 DIAGNOSIS — Z20822 Contact with and (suspected) exposure to covid-19: Secondary | ICD-10-CM

## 2019-10-10 LAB — NOVEL CORONAVIRUS, NAA: SARS-CoV-2, NAA: NOT DETECTED

## 2019-10-12 ENCOUNTER — Ambulatory Visit (INDEPENDENT_AMBULATORY_CARE_PROVIDER_SITE_OTHER): Payer: BC Managed Care – PPO | Admitting: Family Medicine

## 2019-10-12 ENCOUNTER — Other Ambulatory Visit: Payer: Self-pay

## 2019-10-12 DIAGNOSIS — J019 Acute sinusitis, unspecified: Secondary | ICD-10-CM

## 2019-10-12 MED ORDER — CEFDINIR 300 MG PO CAPS: ORAL_CAPSULE | ORAL | 0 refills | Status: DC

## 2019-10-12 NOTE — Progress Notes (Signed)
   Subjective:    Patient ID: Isabel Atkinson, female    DOB: 05/02/1966, 54 y.o.   MRN: [id number]  Sinusitis This is a new problem. Episode onset: one week. (Headache, runny nose, colored mucus, night sweats) Treatments tried: Advil Cold and SInus. The treatment provided mild relief.   Pt has appt to receive her COVID shot on Sunday and is wanting to know if she can still get the shot.   Virtual Visit via Telephone Note  I connected with ESA CADD on 10/12/19 at  3:00 PM EST by telephone and verified that I am speaking with the correct person using two identifiers.  Location: Patient: home Provider: office   I discussed the limitations, risks, security and privacy concerns of performing an evaluation and management service by telephone and the availability of in person appointments. I also discussed with the patient that there may be a patient responsible charge related to this service. The patient expressed understanding and agreed to proceed.   History of Present Illness:    Observations/Objective:   Assessment and Plan:   Follow Up Instructions:    I discussed the assessment and treatment plan with the patient. The patient was provided an opportunity to ask questions and all were answered. The patient agreed with the plan and demonstrated an understanding of the instructions.   The patient was advised to call back or seek an in-person evaluation if the symptoms worsen or if the condition fails to improve as anticipated.  I provided 20 minutes of non-face-to-face time during this encounter.  Patient just had Covid test 3 days ago.  This was negative.  She is around grandkids who have various viruses and sicknesses at times  Notes frontal congestion.  Frontal headache.  Nasal discharge.  Stuffiness.  Diminished energy.  No fever no chills no shortness of breath no cough no chest      Review of Systems See above    Objective:   Physical  Exam  Virtual      Assessment & Plan:  Impression rhinosinusitis.  Antibiotics prescribed.  Symptom care discussed warning signs discussed

## 2019-10-14 ENCOUNTER — Ambulatory Visit: Payer: BC Managed Care – PPO | Attending: Internal Medicine

## 2019-10-14 DIAGNOSIS — Z23 Encounter for immunization: Secondary | ICD-10-CM | POA: Insufficient documentation

## 2019-10-14 NOTE — Progress Notes (Signed)
   Covid-19 Vaccination Clinic  Name:  Isabel Atkinson    MRN: [id number] DOB: October 28, 1965  10/14/2019  Isabel Atkinson was observed post Covid-19 immunization for 30 minutes based on pre-vaccination screening without incident. She was provided with Vaccine Information Sheet and instruction to access the V-Safe system.   Isabel Atkinson was instructed to call 911 with any severe reactions post vaccine: Marland Kitchen Difficulty breathing  . Swelling of face and throat  . A fast heartbeat  . A bad rash all over body  . Dizziness and weakness   Immunizations Administered    Name Date Dose VIS Date Route   Pfizer COVID-19 Vaccine 10/14/2019 10:47 AM 0.3 mL 07/20/2019 Intramuscular   Manufacturer: [place]   Lot: [id number]   [place]: [id number]

## 2019-11-04 ENCOUNTER — Ambulatory Visit: Payer: Self-pay | Attending: Internal Medicine

## 2019-11-04 DIAGNOSIS — Z23 Encounter for immunization: Secondary | ICD-10-CM

## 2019-11-04 NOTE — Progress Notes (Signed)
   Covid-19 Vaccination Clinic  Name:  Isabel Atkinson    MRN: [id number] DOB: [birth year]  11/04/2019  [name] was observed post Covid-19 immunization for 15 minutes without incident. She was provided with Vaccine Information Sheet and instruction to access the V-Safe system.   [name] was instructed to call 911 with any severe reactions post vaccine: Marland Kitchen Difficulty breathing  . Swelling of face and throat  . A fast heartbeat  . A bad rash all over body  . Dizziness and weakness   Immunizations Administered    Name Date Dose VIS Date Route   Pfizer COVID-19 Vaccine 11/04/2019 10:07 AM 0.3 mL 07/20/2019 Intramuscular   Manufacturer: [place]   Lot: [id number]   [place]: [id number]

## 2019-11-07 ENCOUNTER — Telehealth: Payer: Self-pay | Admitting: Family Medicine

## 2019-11-07 NOTE — Telephone Encounter (Signed)
Pt has a burn on her arms. Pt works in [employer] and burnt arms 3 times. Pt was reaching into an oven. Pt states a couple of years ago, she was prescribed silvadene cream. Please advise. Thank you.

## 2019-11-07 NOTE — Telephone Encounter (Signed)
Patient is requesting ointment for burn called into [facility]

## 2019-11-08 MED ORDER — SILVER SULFADIAZINE 1 % EX CREA: TOPICAL_CREAM | CUTANEOUS | 6 refills | Status: DC

## 2019-11-08 NOTE — Telephone Encounter (Signed)
Silvadene cream 1 small tub 50 g apply twice daily as needed with 6 refills

## 2019-11-08 NOTE — Telephone Encounter (Signed)
Cream sent to pharmacy and pt is aware

## 2019-12-04 ENCOUNTER — Encounter: Payer: Self-pay | Admitting: Family Medicine

## 2019-12-04 ENCOUNTER — Other Ambulatory Visit: Payer: Self-pay

## 2019-12-04 ENCOUNTER — Telehealth: Payer: Self-pay | Admitting: *Deleted

## 2019-12-04 ENCOUNTER — Ambulatory Visit: Payer: BC Managed Care – PPO | Admitting: Family Medicine

## 2019-12-04 VITALS — BP 130/84 | Temp 98.2°F | Wt 102.4 lb

## 2019-12-04 DIAGNOSIS — F17200 Nicotine dependence, unspecified, uncomplicated: Secondary | ICD-10-CM | POA: Insufficient documentation

## 2019-12-04 DIAGNOSIS — J432 Centrilobular emphysema: Secondary | ICD-10-CM

## 2019-12-04 DIAGNOSIS — M7711 Lateral epicondylitis, right elbow: Secondary | ICD-10-CM | POA: Diagnosis not present

## 2019-12-04 DIAGNOSIS — M810 Age-related osteoporosis without current pathological fracture: Secondary | ICD-10-CM

## 2019-12-04 DIAGNOSIS — R300 Dysuria: Secondary | ICD-10-CM | POA: Diagnosis not present

## 2019-12-04 DIAGNOSIS — R6889 Other general symptoms and signs: Secondary | ICD-10-CM

## 2019-12-04 DIAGNOSIS — Z720 Tobacco use: Secondary | ICD-10-CM

## 2019-12-04 DIAGNOSIS — E785 Hyperlipidemia, unspecified: Secondary | ICD-10-CM

## 2019-12-04 DIAGNOSIS — D509 Iron deficiency anemia, unspecified: Secondary | ICD-10-CM

## 2019-12-04 LAB — POCT URINALYSIS DIPSTICK
Spec Grav, UA: 1.015 (ref 1.010–1.025)
pH, UA: 6 (ref 5.0–8.0)

## 2019-12-04 MED ORDER — ETODOLAC 400 MG PO TABS: ORAL_TABLET | ORAL | 0 refills | Status: DC

## 2019-12-04 NOTE — Telephone Encounter (Signed)
Patient called back and was advised of appointment date.

## 2019-12-04 NOTE — Patient Instructions (Signed)
Steps to Quit Smoking Smoking tobacco is the leading cause of preventable death. It can affect almost every organ in the body. Smoking puts you and people around you at risk for many serious, long-lasting (chronic) diseases. Quitting smoking can be hard, but it is one of the best things that you can do for your health. It is never too late to quit. How do I get ready to quit? When you decide to quit smoking, make a plan to help you succeed. Before you quit:  Pick a date to quit. Set a date within the next 2 weeks to give you time to prepare.  Write down the reasons why you are quitting. Keep this list in places where you will see it often.  Tell your family, friends, and co-workers that you are quitting. Their support is important.  Talk with your doctor about the choices that may help you quit.  Find out if your health insurance will pay for these treatments.  Know the people, places, things, and activities that make you want to smoke (triggers). Avoid them. What first steps can I take to quit smoking?  Throw away all cigarettes at home, at work, and in your car.  Throw away the things that you use when you smoke, such as ashtrays and lighters.  Clean your car. Make sure to empty the ashtray.  Clean your home, including curtains and carpets. What can I do to help me quit smoking? Talk with your doctor about taking medicines and seeing a counselor at the same time. You are more likely to succeed when you do both.  If you are pregnant or breastfeeding, talk with your doctor about counseling or other ways to quit smoking. Do not take medicine to help you quit smoking unless your doctor tells you to do so. To quit smoking: Quit right away  Quit smoking totally, instead of slowly cutting back on how much you smoke over a period of time.  Go to counseling. You are more likely to quit if you go to counseling sessions regularly. Take medicine You may take medicines to help you quit. Some  medicines need a prescription, and some you can buy over-the-counter. Some medicines may contain a drug called nicotine to replace the nicotine in cigarettes. Medicines may:  Help you to stop having the desire to smoke (cravings).  Help to stop the problems that come when you stop smoking (withdrawal symptoms). Your doctor may ask you to use:  Nicotine patches, gum, or lozenges.  Nicotine inhalers or sprays.  Non-nicotine medicine that is taken by mouth. Find resources Find resources and other ways to help you quit smoking and remain smoke-free after you quit. These resources are most helpful when you use them often. They include:  Online chats with a counselor.  Phone quitlines.  Printed self-help materials.  Support groups or group counseling.  Text messaging programs.  Mobile phone apps. Use apps on your mobile phone or tablet that can help you stick to your quit plan. There are many free apps for mobile phones and tablets as well as websites. Examples include Quit Guide from the CDC and smokefree.gov  What things can I do to make it easier to quit?   Talk to your family and friends. Ask them to support and encourage you.  Call a phone quitline (1-800-QUIT-NOW), reach out to support groups, or work with a counselor.  Ask people who smoke to not smoke around you.  Avoid places that make you want to smoke,   such as: ? Bars. ? Parties. ? Smoke-break areas at work.  Spend time with people who do not smoke.  Lower the stress in your life. Stress can make you want to smoke. Try these things to help your stress: ? Getting regular exercise. ? Doing deep-breathing exercises. ? Doing yoga. ? Meditating. ? Doing a body scan. To do this, close your eyes, focus on one area of your body at a time from head to toe. Notice which parts of your body are tense. Try to relax the muscles in those areas. How will I feel when I quit smoking? Day 1 to 3 weeks Within the first 24 hours,  you may start to have some problems that come from quitting tobacco. These problems are very bad 2-3 days after you quit, but they do not often last for more than 2-3 weeks. You may get these symptoms:  Mood swings.  Feeling restless, nervous, angry, or annoyed.  Trouble concentrating.  Dizziness.  Strong desire for high-sugar foods and nicotine.  Weight gain.  Trouble pooping (constipation).  Feeling like you may vomit (nausea).  Coughing or a sore throat.  Changes in how the medicines that you take for other issues work in your body.  Depression.  Trouble sleeping (insomnia). Week 3 and afterward After the first 2-3 weeks of quitting, you may start to notice more positive results, such as:  Better sense of smell and taste.  Less coughing and sore throat.  Slower heart rate.  Lower blood pressure.  Clearer skin.  Better breathing.  Fewer sick days. Quitting smoking can be hard. Do not give up if you fail the first time. Some people need to try a few times before they succeed. Do your best to stick to your quit plan, and talk with your doctor if you have any questions or concerns. Summary  Smoking tobacco is the leading cause of preventable death. Quitting smoking can be hard, but it is one of the best things that you can do for your health.  When you decide to quit smoking, make a plan to help you succeed.  Quit smoking right away, not slowly over a period of time.  When you start quitting, seek help from your doctor, family, or friends. This information is not intended to replace advice given to you by your health care provider. Make sure you discuss any questions you have with your health care provider. Document Revised: 04/20/2019 Document Reviewed: 10/14/2018 Elsevier Patient Education  2020 Elsevier Inc.  

## 2019-12-04 NOTE — Progress Notes (Signed)
Subjective:    Patient ID: Isabel Atkinson, female    DOB: 06/02/1966, 54 y.o.   MRN: [id number]  HPI Pt here today for right elbow pain. Pt noticed a small knot on elbow. Pt states it has been there about a month. Sometimes pain radiates to shoulder. No swelling. Pt has tried Tylenol and Volteran cream, some relief.  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 For the past month elbow pain No numbness  Not seen any blood Pt also states that provider informed her that he would like urine checked at next visit. Pt is not having any issues but last time patient was here she had blood in urine.  Dysuria - Plan: POCT Urinalysis Dipstick, Urine Culture  Centrilobular emphysema (HCC) - Plan: CBC with Differential, Ferritin, Lipid Profile, Basic Metabolic Panel (BMET), TSH  Osteoporosis, unspecified osteoporosis type, unspecified pathological fracture presence - Plan: DG Bone Density  Lateral epicondylitis of right elbow  Hyperlipidemia, unspecified hyperlipidemia type - Plan: CBC with Differential, Ferritin, Lipid Profile, Basic Metabolic Panel (BMET), TSH  Cold intolerance - Plan: CBC with Differential, Ferritin, Lipid Profile, Basic Metabolic Panel (BMET), TSH  Iron deficiency anemia, unspecified iron deficiency anemia type - Plan: CBC with Differential, Ferritin, Lipid Profile, Basic Metabolic Panel (BMET), TSH  Tobacco abuse - Plan: Ambulatory Referral for Lung Cancer Scre  Patient does relate some cold intolerance and some intermittent fatigue she wonders if she is anemic she wonders if she is having thyroid issues  Also patient smokes we counseled her at length about quitting she has tried numerous measures without success I encourage her to try patches again she did not tolerate Chantix or Wellbutrin  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  HENT: Negative for congestion and rhinorrhea.   Respiratory: Negative for cough and shortness of breath.   Cardiovascular: Negative for chest pain and leg swelling.  Gastrointestinal: Negative for abdominal pain and diarrhea.  Endocrine: Negative for polydipsia and polyphagia.  Skin: Negative for color change.  Neurological: Negative for dizziness and weakness.  Psychiatric/Behavioral: Negative for behavioral problems and confusion.       Objective:   Physical Exam Vitals reviewed.  Constitutional:      General: She is not in acute distress. HENT:     Head: Normocephalic and atraumatic.  Eyes:     General:        Right eye: No discharge.        Left eye: No discharge.  Neck:     Trachea: No tracheal deviation.  Cardiovascular:  Rate and Rhythm: Normal rate and regular rhythm.     Heart sounds: Normal heart sounds. No murmur.  Pulmonary:     Effort: Pulmonary effort is normal. No respiratory distress.     Breath sounds: Normal breath sounds.  Lymphadenopathy:     Cervical: No cervical adenopathy.  Skin:    General: Skin is warm and dry.  Neurological:     Mental Status: She is alert.     Coordination: Coordination normal.  Psychiatric:        Behavior: Behavior normal.           Assessment & Plan:  1. Dysuria Patient was concerned stating that her previous urine blood was seen microscopically.  She has not seen any blood  recently.  Urine under the microscope does not show any RBCs.  Had 1+ blood on dipstick but this once again does not show up on the microscope aspect.  Also 1+ leukocyte no WBCs seen urine culture sent - POCT Urinalysis Dipstick - Urine Culture  2. Centrilobular emphysema ([place]) Patient strongly encouraged to quit smoking.  Unfortunately she is unlikely to do so I did talk with her about nicotine patches she has tried Wellbutrin and Chantix and had side effects - CBC with Differential - Ferritin - Lipid Profile - Basic Metabolic Panel (BMET) - TSH  3. Osteoporosis, unspecified osteoporosis type, unspecified pathological fracture presence Bone density recommended await results - DG Bone Density  4. Lateral epicondylitis of right elbow May use tennis elbow brace, also cold compresses and anti-inflammatory offered injection she defers currently follow-up if ongoing troubles may need injection  5. Hyperlipidemia, unspecified hyperlipidemia type Lab work recommended previous labs reviewed - CBC with Differential - Ferritin - Lipid Profile - Basic Metabolic Panel (BMET) - TSH  6. Cold intolerance Concerning for the possibility of thyroid or iron deficiency anemia check labs await results - CBC with Differential - Ferritin - Lipid Profile - Basic Metabolic Panel (BMET) - TSH  7. Iron deficiency anemia, unspecified iron deficiency anemia type Some fatigue tiredness check labs await results - CBC with Differential - Ferritin - Lipid Profile - Basic Metabolic Panel (BMET) - TSH  8. Tobacco abuse Encourage patient to quit smoking unlikely she will do so because of addicted to nicotine-see above-recommended nicotine patches Also recommended low-dose CT scan for lung cancer patient is interested in doing so - Ambulatory Referral for Lung Cancer Scre

## 2019-12-08 LAB — URINE CULTURE

## 2019-12-10 ENCOUNTER — Other Ambulatory Visit: Payer: Self-pay | Admitting: *Deleted

## 2019-12-10 ENCOUNTER — Other Ambulatory Visit (HOSPITAL_COMMUNITY): Payer: BC Managed Care – PPO

## 2019-12-10 MED ORDER — CEFDINIR 300 MG PO CAPS
300.0000 mg | ORAL_CAPSULE | Freq: Two times a day (BID) | ORAL | 0 refills | Status: DC
Start: 2019-12-10 — End: 2019-12-28

## 2019-12-10 NOTE — Progress Notes (Signed)
omnice

## 2019-12-12 ENCOUNTER — Encounter (HOSPITAL_COMMUNITY): Payer: Self-pay | Admitting: *Deleted

## 2019-12-12 ENCOUNTER — Other Ambulatory Visit (HOSPITAL_COMMUNITY): Payer: Self-pay | Admitting: *Deleted

## 2019-12-12 DIAGNOSIS — Z122 Encounter for screening for malignant neoplasm of respiratory organs: Secondary | ICD-10-CM

## 2019-12-12 DIAGNOSIS — Z87891 Personal history of nicotine dependence: Secondary | ICD-10-CM

## 2019-12-12 NOTE — Progress Notes (Signed)
Orders placed for LDCT 

## 2019-12-12 NOTE — Progress Notes (Signed)
Received referral for initial lung cancer screening scan.  Contacted patient and obtained smoking history (started age 54 smoking 1/2 pack per day until age 57, then smoked 1 ppd until now, current smoker, 37 pack year) as well as answering questions related to the screening process.  Patient denies signs/symptoms of lung cancer such as weight loss or hemoptysis.  Patient denies comorbidity that would prevent curative treatment if lung cancer were to be found.  Patient is scheduled for shared decision making visit and CT scan on 6/11 at 1030.

## 2019-12-19 ENCOUNTER — Other Ambulatory Visit: Payer: BC Managed Care – PPO

## 2019-12-19 ENCOUNTER — Other Ambulatory Visit: Payer: Self-pay

## 2019-12-28 ENCOUNTER — Ambulatory Visit: Payer: BC Managed Care – PPO | Admitting: Family Medicine

## 2019-12-28 ENCOUNTER — Other Ambulatory Visit: Payer: Self-pay

## 2019-12-28 ENCOUNTER — Encounter: Payer: Self-pay | Admitting: Family Medicine

## 2019-12-28 VITALS — BP 130/84 | Temp 97.0°F | Ht 62.0 in | Wt 102.8 lb

## 2019-12-28 DIAGNOSIS — M7701 Medial epicondylitis, right elbow: Secondary | ICD-10-CM

## 2019-12-28 DIAGNOSIS — R300 Dysuria: Secondary | ICD-10-CM

## 2019-12-28 LAB — POCT URINALYSIS DIPSTICK
Spec Grav, UA: <=1.005 — AB (ref 1.010–1.025)
pH, UA: 8 (ref 5.0–8.0)

## 2019-12-28 NOTE — Progress Notes (Signed)
   Subjective:    Patient ID: Isabel Atkinson, female    DOB: [birth year], 54 y.o.   MRN: [id number]  HPIright elbow pain since april. Tried etodolac and ibuprofen. Seen in April and was offered injection but pt declined. Would like to get today.    Ongoing pain and tend in the lat  Elbow Anti inflams did not help mcuh  Wore forearm   Strap    Wanted to have urine checked to make sure infection from last time was cleared up. Not having any symptoms.   Results for orders placed or performed in visit on 12/28/19  POCT urinalysis dipstick  Result Value Ref Range   Color, UA     Clarity, UA     Glucose, UA     Bilirubin, UA     Ketones, UA     Spec Grav, UA <=1.005 (A) 1.010 - 1.025   Blood, UA     pH, UA 8.0 5.0 - 8.0   Protein, UA     Urobilinogen, UA     Nitrite, UA     Leukocytes, UA     Appearance     Odor       Review of Systems No headache no chest pain no shortness of breath    Objective:   Physical Exam  Alert vitals stable, NAD. Blood pressure good on repeat. HEENT normal. Lungs clear. Heart regular rate and rhythm. Right lateral epicondyle very tender to palpation  Patient was prepped draped injected with 1/2 cc of Depo-Medrol and 1 cc of Xylocaine at point of maximal tenderness Urinalysis normal     Assessment & Plan:  Impression lateral epicondylitis injection plan local measures discussed.  Maintain forearm strap

## 2019-12-31 ENCOUNTER — Other Ambulatory Visit: Payer: BC Managed Care – PPO

## 2020-01-01 ENCOUNTER — Other Ambulatory Visit: Payer: Self-pay

## 2020-01-01 ENCOUNTER — Ambulatory Visit: Payer: BC Managed Care – PPO | Attending: Internal Medicine

## 2020-01-01 DIAGNOSIS — Z20822 Contact with and (suspected) exposure to covid-19: Secondary | ICD-10-CM

## 2020-01-02 LAB — SARS-COV-2, NAA 2 DAY TAT

## 2020-01-02 LAB — NOVEL CORONAVIRUS, NAA: SARS-CoV-2, NAA: NOT DETECTED

## 2020-01-12 LAB — BASIC METABOLIC PANEL
BUN/Creatinine Ratio: 27 — ABNORMAL HIGH (ref 9–23)
BUN: 15 mg/dL (ref 6–24)
CO2: 24 mmol/L (ref 20–29)
Calcium: 9.6 mg/dL (ref 8.7–10.2)
Chloride: 107 mmol/L — ABNORMAL HIGH (ref 96–106)
Creatinine, Ser: 0.56 mg/dL — ABNORMAL LOW (ref 0.57–1.00)
GFR calc Af Amer: 121 mL/min/{1.73_m2} (ref >59)
GFR calc non Af Amer: 105 mL/min/{1.73_m2} (ref >59)
Glucose: 89 mg/dL (ref 65–99)
Potassium: 4.4 mmol/L (ref 3.5–5.2)
Sodium: 143 mmol/L (ref 134–144)

## 2020-01-12 LAB — LIPID PANEL
Chol/HDL Ratio: 2.5 ratio (ref 0.0–4.4)
Cholesterol, Total: 181 mg/dL (ref 100–199)
HDL: 72 mg/dL (ref >39)
LDL Chol Calc (NIH): 95 mg/dL (ref 0–99)
Triglycerides: 76 mg/dL (ref 0–149)
VLDL Cholesterol Cal: 14 mg/dL (ref 5–40)

## 2020-01-12 LAB — CBC WITH DIFFERENTIAL/PLATELET
Basophils Absolute: 0.1 10*3/uL (ref 0.0–0.2)
Basos: 1 %
EOS (ABSOLUTE): 0.3 10*3/uL (ref 0.0–0.4)
Eos: 5 %
Hematocrit: 40.2 % (ref 34.0–46.6)
Hemoglobin: 13.4 g/dL (ref 11.1–15.9)
Immature Grans (Abs): 0 10*3/uL (ref 0.0–0.1)
Immature Granulocytes: 0 %
Lymphocytes Absolute: 2.1 10*3/uL (ref 0.7–3.1)
Lymphs: 32 %
MCH: 29.5 pg (ref 26.6–33.0)
MCHC: 33.3 g/dL (ref 31.5–35.7)
MCV: 88 fL (ref 79–97)
Monocytes Absolute: 0.6 10*3/uL (ref 0.1–0.9)
Monocytes: 9 %
Neutrophils Absolute: 3.5 10*3/uL (ref 1.4–7.0)
Neutrophils: 53 %
Platelets: 299 10*3/uL (ref 150–450)
RBC: 4.55 x10E6/uL (ref 3.77–5.28)
RDW: 12.3 % (ref 11.7–15.4)
WBC: 6.7 10*3/uL (ref 3.4–10.8)

## 2020-01-12 LAB — FERRITIN: Ferritin: 58 ng/mL (ref 15–150)

## 2020-01-12 LAB — TSH: TSH: 1.43 u[IU]/mL (ref 0.450–4.500)

## 2020-01-14 ENCOUNTER — Encounter: Payer: Self-pay | Admitting: Family Medicine

## 2020-01-18 ENCOUNTER — Other Ambulatory Visit: Payer: Self-pay

## 2020-01-18 ENCOUNTER — Inpatient Hospital Stay (HOSPITAL_COMMUNITY): Payer: BC Managed Care – PPO | Attending: Nurse Practitioner | Admitting: Nurse Practitioner

## 2020-01-18 ENCOUNTER — Ambulatory Visit (HOSPITAL_COMMUNITY)
Admission: RE | Admit: 2020-01-18 | Discharge: 2020-01-18 | Disposition: A | Discharge status: Home / Self Care | Payer: BC Managed Care – PPO | Source: Ambulatory Visit | Attending: Nurse Practitioner | Admitting: Nurse Practitioner

## 2020-01-18 DIAGNOSIS — Z87891 Personal history of nicotine dependence: Secondary | ICD-10-CM

## 2020-01-18 DIAGNOSIS — F17210 Nicotine dependence, cigarettes, uncomplicated: Secondary | ICD-10-CM

## 2020-01-18 DIAGNOSIS — Z122 Encounter for screening for malignant neoplasm of respiratory organs: Secondary | ICD-10-CM | POA: Diagnosis present

## 2020-01-18 DIAGNOSIS — Z720 Tobacco use: Secondary | ICD-10-CM

## 2020-01-18 NOTE — Progress Notes (Signed)
[facility] NOTE  Patient Care Team: Kathyrn Drown, MD as PCP - General Rourk, Cristopher Estimable, MD (Gastroenterology) Jovita Gamma, MD as Consulting Physician (Neurosurgery)  CHIEF COMPLAINTS/PURPOSE OF CONSULTATION: Shared decision making visit for lung cancer screening.  HISTORY OF PRESENTING ILLNESS:  Isabel Atkinson 54 y.o. female presents today for shared decision-making visit for lung cancer screening.  She has a past medical history positive for prediabetes, anxiety, and tachycardia.  Patient states she started smoking at the age of 13.  She now currently smokes 1 pack/day.  She has a 37-pack-year history.  She does report occasionally having a cough without sputum production.  She denies any alcohol or illicit drug use.  MEDICAL HISTORY:  Past Medical History:  Diagnosis Date  . Anxiety   . Pre-diabetes   . Reactive airway disease   . Tachycardia     SURGICAL HISTORY: Past Surgical History:  Procedure Laterality Date  . CATARACT EXTRACTION W/PHACO Left 01/17/2017   Procedure: CATARACT EXTRACTION PHACO AND INTRAOCULAR LENS PLACEMENT (IOC);  Surgeon: Tonny Branch, MD;  Location: [facility];  Service: Ophthalmology;  Laterality: Left;  CDE: 6.92  . COLONOSCOPY  2013   RMR: Normal rectum. single diminutive sigmoid polyp noted above. Normal terminal ileum. Status post segmental biopsy. next TCS in 10 years.   . ESOPHAGOGASTRODUODENOSCOPY  2013   RMR: Possible cervical esophageal web-status post dilation as described above. Small hiatal hernia. Status post biopsy of normal appearing small bowel to screen for celiac disease.   . ESOPHAGOGASTRODUODENOSCOPY N/A 05/06/2016   Procedure: ESOPHAGOGASTRODUODENOSCOPY (EGD);  Surgeon: Daneil Dolin, MD;  Location: [facility];  Service: Endoscopy;  Laterality: N/A;  3:00 pm - pt knows to arrive at 2:30  . MALONEY DILATION N/A 05/06/2016   Procedure: Venia Minks DILATION;  Surgeon: Daneil Dolin, MD;  Location: [facility];  Service: Endoscopy;  Laterality: N/A;  . NECK SURGERY  10/2012  . OVARIAN CYST SURGERY  1997   removed  . TUBAL LIGATION  2000    SOCIAL HISTORY: Social History   Socioeconomic History  . Marital status: Married    Spouse name: Not on file  . Number of children: 2  . Years of education: GED  . Highest education level: Not on file  Occupational History  . 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 Smoking status: Current Every Day Smoker    Packs/day: 1.00    Years: 25.00    Pack years: 25.00    Types: Cigarettes  . Smokeless tobacco: Never Used  Substance and Sexual Activity  . Alcohol use: No    Alcohol/week: 0.0 standard drinks  . Drug use: No  . Sexual activity: Yes    Birth control/protection: Post-menopausal, Surgical  Other Topics Concern  . Not on file  Social History Narrative   Lives at home w/ her husband   2 children - ages 82 & 47   Right-sided   Caffeine: 2 beverages per day   Social Determinants of Health   Financial Resource Strain:   . Difficulty of Paying Living Expenses:   Food Insecurity:   . Worried About Charity fundraiser in the Last Year:   . Arboriculturist in the Last Year:   Transportation Needs:   . Film/video editor (Medical):   Marland Kitchen Lack of Transportation (Non-Medical):   Physical Activity:   . Days of Exercise per Week:   .  Minutes of Exercise per Session:   Stress:   . Feeling of Stress :   Social Connections:   . Frequency of Communication with Friends and Family:   . Frequency of Social Gatherings with Friends and Family:   . Attends Religious Services:   . Active Member of Clubs or Organizations:   . Attends Archivist Meetings:   Marland Kitchen Marital Status:   Intimate Partner Violence:   . Fear of Current or Ex-Partner:   . Emotionally Abused:   Marland Kitchen Physically Abused:   . Sexually Abused:     FAMILY HISTORY: Family History  Problem Relation Age of Onset  . Colon cancer  Maternal Grandfather 48  . Colon polyps Mother 68  . Colon cancer Maternal Uncle 51  . Hypertension Father   . Diabetes Father   . Heart disease Father   . Hyperlipidemia Father     ALLERGIES:  is allergic to ciprofloxacin, augmentin [amoxicillin-pot clavulanate], carafate [sucralfate], celexa [citalopram hydrobromide], chantix [varenicline], sulfa antibiotics, wellbutrin [bupropion], and fosamax [alendronate sodium].  MEDICATIONS:  Current Outpatient Medications  Medication Sig Dispense Refill  . ALPRAZolam (XANAX) 0.25 MG tablet TAKE 1/2 TO 1 TABLET BY MOUTH TWICE DAILY AS NEEDED FOR ANXIETY. 30 tablet 0  . Bioflavonoid Products (ESTER C PO) Take 1 tablet by mouth daily.     . Calcium-Vitamin D-Vitamin K (CALCIUM SOFT CHEWS) 500-500-40 MG-UNT-MCG CHEW Chew 1 each by mouth 2 (two) times daily.     . cycloSPORINE (RESTASIS) 0.05 % ophthalmic emulsion Place 1 drop into both eyes 2 (two) times daily.     Marland Kitchen etodolac (LODINE) 400 MG tablet Take one tablet po BID. No Ibuprofen while on Lodine. 30 tablet 0  . ibuprofen (ADVIL,MOTRIN) 600 MG tablet Take 1 tablet (600 mg total) by mouth 3 (three) times daily. 30 tablet 1  . metoprolol succinate (TOPROL-XL) 25 MG 24 hr tablet Take 0.5 tablets (12.5 mg total) by mouth daily. 45 tablet 3  . Multiple Vitamins-Minerals (MULTIVITAMIN WITH MINERALS) tablet Take 1 tablet by mouth daily. Gummy Vitamin    . omeprazole (PRILOSEC) 40 MG capsule TAKE ONE CAPSULE BY MOUTH ONCE DAILY. 30 capsule 5  . silver sulfADIAZINE (SILVADENE) 1 % cream Apply BID prn 50 g 6  . Triamcinolone Acetonide (NASACORT ALLERGY 24HR NA) Place 2 sprays into the nose daily.     No current facility-administered medications for this visit.    Vitals:   01/18/20 1051  BP: (!) 124/49  Pulse: 81  Resp: 18  Temp: 98.1 F (36.7 C)  SpO2: 100%   Filed Weights   01/18/20 1051  Weight: 102 lb 6.4 oz (46.4 kg)    LABORATORY DATA:  I have reviewed the data as listed Lab Results   Component Value Date   WBC 6.7 01/11/2020   HGB 13.4 01/11/2020   HCT 40.2 01/11/2020   MCV 88 01/11/2020   PLT 299 01/11/2020     Chemistry      Component Value Date/Time   NA 143 01/11/2020 0811   K 4.4 01/11/2020 0811   CL 107 (H) 01/11/2020 0811   CO2 24 01/11/2020 0811   BUN 15 01/11/2020 0811   CREATININE 0.56 (L) 01/11/2020 0811   CREATININE 0.55 11/03/2013 0951      Component Value Date/Time   CALCIUM 9.6 01/11/2020 0811   ALKPHOS 71 12/01/2017 0922   ALKPHOS 76 12/09/2011 0900   AST 20 12/01/2017 0922   AST 18 12/09/2011 0900   ALT 16 12/01/2017  8309   BILITOT 0.4 12/01/2017 0922   BILITOT 0.3 12/09/2011 0900       ASSESSMENT & PLAN:  Tobacco abuse 1.  Tobacco abuse: -This patient meets the criteria for low-dose CT lung cancer screening. -She is asymptomatic for any signs and symptoms of lung cancer. -This shared decision making visit discussion included risk and benefits of screening, potential for follow-up, diagnostic testing for abnormal scans, potential for false positive test, over diagnosis, and discussion about total radiation exposure. -Patient stated willingness to undergo diagnostics and treatment as needed. -Patient was counseled on smoking cessation to decrease risk of lung cancer, pulmonary disease, heart disease and stroke. -Patient was given a resource card with information on receiving free nicotine replacement therapy and information about free smoking cessation classes. -Patient will present for LD CT scan today and follow-up with PCP.  No orders of the defined types were placed in this encounter.   All questions were answered. The patient knows to call the clinic with any problems, questions or concerns. I spent 10 mins counseling the patient face to face. The total time spent in the appointment was 20 mins and more than 50% was on counseling.     Glennie Isle, NP-C 01/18/2020 11:24 AM

## 2020-01-18 NOTE — Assessment & Plan Note (Signed)
1.  Tobacco abuse: -This patient meets the criteria for low-dose CT lung cancer screening. -She is asymptomatic for any signs and symptoms of lung cancer. -This shared decision making visit discussion included risk and benefits of screening, potential for follow-up, diagnostic testing for abnormal scans, potential for false positive test, over diagnosis, and discussion about total radiation exposure. -Patient stated willingness to undergo diagnostics and treatment as needed. -Patient was counseled on smoking cessation to decrease risk of lung cancer, pulmonary disease, heart disease and stroke. -Patient was given a resource card with information on receiving free nicotine replacement therapy and information about free smoking cessation classes. -Patient will present for LD CT scan today and follow-up with PCP.

## 2020-01-18 NOTE — Patient Instructions (Signed)
You were seen today for your shared decision making visit and a low-dose CT scan for lung cancer screening.    

## 2020-01-21 ENCOUNTER — Encounter (HOSPITAL_COMMUNITY): Payer: Self-pay | Admitting: *Deleted

## 2020-01-21 NOTE — Progress Notes (Signed)
Patient notified via mail of LDCT lung cancer screening results with recommendations to follow up in 12 months.  Also notified of incidental findings and need to follow up with PCP.  Patient's referring provider was sent a copy of results.    IMPRESSION: 1. Lung-RADS 2, benign appearance or behavior. Continue annual screening with low-dose chest CT without contrast in 12 months. 2. Aortic Atherosclerosis (ICD10-I70.0) and Emphysema (ICD10-J43.9). 3. Age advanced coronary artery atherosclerosis. Recommend assessment of coronary risk factors and consideration of medical therapy.

## 2020-01-27 ENCOUNTER — Telehealth: Payer: Self-pay | Admitting: Family Medicine

## 2020-01-27 NOTE — Telephone Encounter (Signed)
    Nurses Please connect with patient Recently she had a CT scan for the purpose of lung cancer screening.  They were able to communicate to her the results.  It did not show any cancer.  It did show changes consistent with coronary artery disease.  Therefore very important for her to work hard at quitting smoking.  Also eating healthy.  I recommend a follow-up visit in July or August as a follow-up discussion regarding discussing her risk regarding coronary artery disease

## 2020-01-28 NOTE — Telephone Encounter (Signed)
Discussed with pt. Pt verbalized understanding. And transferred to the front to schedule visit to discuss in July or august

## 2020-02-02 ENCOUNTER — Other Ambulatory Visit: Payer: Self-pay | Admitting: Family Medicine

## 2020-02-26 ENCOUNTER — Telehealth: Payer: Self-pay | Admitting: Family Medicine

## 2020-02-26 ENCOUNTER — Other Ambulatory Visit: Payer: Self-pay

## 2020-02-26 ENCOUNTER — Telehealth (INDEPENDENT_AMBULATORY_CARE_PROVIDER_SITE_OTHER): Payer: BC Managed Care – PPO | Admitting: Family Medicine

## 2020-02-26 DIAGNOSIS — E785 Hyperlipidemia, unspecified: Secondary | ICD-10-CM | POA: Diagnosis not present

## 2020-02-26 DIAGNOSIS — I2510 Atherosclerotic heart disease of native coronary artery without angina pectoris: Secondary | ICD-10-CM | POA: Diagnosis not present

## 2020-02-26 DIAGNOSIS — I700 Atherosclerosis of aorta: Secondary | ICD-10-CM

## 2020-02-26 DIAGNOSIS — Z720 Tobacco use: Secondary | ICD-10-CM

## 2020-02-26 DIAGNOSIS — Z79899 Other long term (current) drug therapy: Secondary | ICD-10-CM

## 2020-02-26 DIAGNOSIS — J432 Centrilobular emphysema: Secondary | ICD-10-CM

## 2020-02-26 MED ORDER — AZITHROMYCIN 250 MG PO TABS: ORAL_TABLET | ORAL | 0 refills | Status: DC

## 2020-02-26 MED ORDER — ROSUVASTATIN CALCIUM 5 MG PO TABS
ORAL_TABLET | ORAL | 5 refills | Status: DC
Start: 2020-02-26 — End: 2021-08-18

## 2020-02-26 NOTE — Telephone Encounter (Signed)
Isabel Atkinson, Isabel Atkinson are scheduled for a virtual visit with your provider today.    Just as we do with appointments in the office, we must obtain your consent to participate.  Your consent will be active for this visit and any virtual visit you may have with one of our providers in the next 365 days.    If you have a MyChart account, I can also send a copy of this consent to you electronically.  All virtual visits are billed to your insurance company just like a traditional visit in the office.  As this is a virtual visit, video technology does not allow for your provider to perform a traditional examination.  This may limit your provider's ability to fully assess your condition.  If your provider identifies any concerns that need to be evaluated in person or the need to arrange testing such as labs, EKG, etc, we will make arrangements to do so.    Although advances in technology are sophisticated, we cannot ensure that it will always work on either your end or our end.  If the connection with a video visit is poor, we may have to switch to a telephone visit.  With either a video or telephone visit, we are not always able to ensure that we have a secure connection.   I need to obtain your verbal consent now.   Are you willing to proceed with your visit today?   Isabel Atkinson has provided verbal consent on 02/26/2020 for a virtual visit (video or telephone).   Vicente Males, LPN 11/24/4079  4:48 AM

## 2020-02-26 NOTE — Progress Notes (Signed)
Subjective:    Patient ID: Isabel Atkinson, female    DOB: [birth year], 54 y.o.   MRN: [id number]  HPI Pt is wanting to speak with provider about some test she had done. Pt also having a cough, runny nose and sneezing that stated on Thursday. Pt grandson diagnosed with 5th disease last week. Pt has been using Advil Cold and Sinus and that seems to be helping some. Pt is taking all meds as prescribed with no issues. Patient does relate head congestion drainage coughing up discolored phlegm She does have underlying COPD She is a smoker she has been counseled to quit In addition to this patient had recent CT scan which showed coronary artery disease rather extensive. Virtual Visit via Telephone Note  I connected with Isabel Atkinson on 02/26/20 at  8:40 AM EDT by telephone and verified that I am speaking with the correct person using two identifiers.  Location: Patient: home Provider: office   I discussed the limitations, risks, security and privacy concerns of performing an evaluation and management service by telephone and the availability of in person appointments. I also discussed with the patient that there may be a patient responsible charge related to this service. The patient expressed understanding and agreed to proceed.   History of Present Illness:    Observations/Objective:   Assessment and Plan:   Follow Up Instructions:    I discussed the assessment and treatment plan with the patient. The patient was provided an opportunity to ask questions and all were answered. The patient agreed with the plan and demonstrated an understanding of the instructions.   The patient was advised to call back or seek an in-person evaluation if the symptoms worsen or if the condition fails to improve as anticipated.  I provided 20 minutes of non-face-to-face time during this encounter.       Review of Systems She does relate head congestion drainage coughing denies high fever  chills sweats she has been vaccinated for Covid family members have had similar symptoms and they did not test positive for Covid    Objective:   Physical Exam Today's visit was via telephone Physical exam was not possible for this visit   Patient denies shortness of breath     Assessment & Plan:  Hyperlipidemia, unspecified hyperlipidemia type - Plan: Lipid Profile, Hepatic function panel  Coronary artery disease involving native heart without angina pectoris, unspecified vessel or lesion type  Centrilobular emphysema (HCC)  Tobacco abuse  Aortic atherosclerosis (HCC)  High risk medication use - Plan: Lipid Profile, Hepatic function panel  1. Hyperlipidemia, unspecified hyperlipidemia type So now it is important to get her LDL below 70 to lower the risk of progressive heart disease.  Crestor 5 mg daily recommended.  Repeat lab work again in approximately 8 weeks side effects were discussed call us if any problems - Lipid Profile - Hepatic function panel  2. Coronary artery disease involving native heart without angina pectoris, unspecified vessel or lesion type Significant coronary artery disease will touch base with Dr. Harrington Challenger to see if she recommends a CT of the coronary arteries or recommends any type of testing  3. Centrilobular emphysema ([place]) Patient was strongly counseled to quit smoking because of this being seen on x-ray she has been told over the past several years to work hard quitting smoking unfortunately this has been unlikely for her to do but now she is motivated she will try patches  4. Tobacco abuse Tobacco counseling discussed today patient  did not tolerate Chantix or Wellbutrin in the past therefore she will use the patches as discussed above  5. Aortic atherosclerosis (HCC) Importance of getting cholesterol under control and quitting smoking discussed to lower the risk of heart disease and stroke Because of her coronary artery disease I do recommend 81 mg  aspirin daily I believe the benefits outweigh the risk The risk of ulcers were discussed as well as the risk of bleeding ulcers and warning signs to watch for 6. High risk medication use Please see above - Lipid Profile - Hepatic function panel  Antibiotic was sent in for COPD exacerbation warning signs were discussed

## 2020-02-26 NOTE — Patient Instructions (Signed)
T

## 2020-02-27 NOTE — Progress Notes (Signed)
Aspirin 81 mg added to med list

## 2020-02-27 NOTE — H&P (Signed)
Surgical History & Physical  Patient Name: Isabel Atkinson DOB: 1965/12/21  Surgery: Cataract extraction with intraocular lens implant phacoemulsification; Right Eye  Surgeon: Baruch Goldmann MD Surgery Date:  03/07/2020 Pre-Op Date:  02/21/2020  HPI: A 57 Yr. old female patient Referred from Dr. Jorja Loa 1. 1. The patient is presenting for a cataract evaluation of the right eye. Patient feels her vision has decreased over time. The condition's severity is constant. Patient noticing she needs more lights to read. Also, she is having glare at night when driving. This is negatively affecting the patient's quality of life. Patient is taking Restasis BID OU. HPI was performed by Baruch Goldmann .  Medical History: Dry Eyes Cataracts OS: Yag-2018 OS PCIOL-2018 Heart Problem  Review of Systems Negative Allergic/Immunologic Negative Cardiovascular Negative Constitutional Negative Ear, Nose, Mouth & Throat Negative Endocrine Negative Eyes Negative Gastrointestinal Negative Genitourinary Negative Hemotologic/Lymphatic Negative Integumentary Negative Musculoskeletal Negative Neurological Negative Psychiatry Negative Respiratory  Social   Current every day smoker of Cigarettes   Medication Restasis,  ALPRAZOLAM TAB, OMEPRAZOLE CAP,   Sx/Procedures Phaco c IOL, YAG Capsulotomy,  Cyst removed from ovary, Neck surgery, Tubal Ligation,   Drug Allergies  Sulfa,   History & Physical: Heent:  Cataract, Right eye NECK: supple without bruits LUNGS: lungs clear to auscultation CV: regular rate and rhythm Abdomen: soft and non-tender  Impression & Plan: Assessment: 1.  NUCLEAR SCLEROSIS AGE RELATED; Right Eye (H25.11) 2.  INTRAOCULAR LENS IOL ; Left Eye (Z96.1) 3.  Ocular Hypertension; Both Eyes (H40.053) 4.  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  Cataract accounts for the patient's decreased vision. This visual impairment is not correctable with a  tolerable change in glasses or contact lenses. Cataract surgery with an implantation of a new lens should significantly improve the visual and functional status of the patient. Discussed all risks, benefits, alternatives, and potential complications. Discussed the procedures and recovery. Patient desires to have surgery. A-scan ordered and performed today for intra-ocular lens calculations. The surgery will be performed in order to improve vision for driving, reading, and for eye examinations. Recommend phacoemulsification with intra-ocular lens. Right Eye. Surgery required to correct imbalance of vision. Dilates well - shugarcaine by protocol. Symfony Lens. [place] OS from old lens calcs - use old lens calcs. 2.  Symfony. S/P YAG. Doing very well. 3.  OCT rNFL WNL OU. Will monitor after cataract surgery as phaco PCIOL helps reduce IOP alone. 4.  Continue Restasis 1 drop every 12 hours both eyes.

## 2020-02-29 NOTE — Patient Instructions (Signed)
Isabel Atkinson  02/29/2020     @PREFPERIOPPHARMACY @   Your procedure is scheduled on  03/07/2020 .  Report to [facility] at  [phone]  A.M.  Call this number if you have problems the morning of surgery:  [phone]   Remember:  Do not eat or drink after midnight.                          Take these medicines the morning of surgery with A SIP OF WATER  Xanax(if needed), omeprazole.    Do not wear jewelry, make-up or nail polish.  Do not wear lotions, powders, or perfumes. Please wear deodorant and brush your teeth.  Do not shave 48 hours prior to surgery.  Men may shave face and neck.  Do not bring valuables to the hospital.  [facility] is not responsible for any belongings or valuables.  Contacts, dentures or bridgework may not be worn into surgery.  Leave your suitcase in the car.  After surgery it may be brought to your room.  For patients admitted to the hospital, discharge time will be determined by your treatment team.  Patients discharged the day of surgery will not be allowed to drive home.   Name and phone number of your driver:   family Special instructions:  DO NOT smoke the morning of your procedure  Please read over the following fact sheets that you were given. Anesthesia Post-op Instructions and Care and Recovery After Surgery       Cataract Surgery, Care After This sheet gives you information about how to care for yourself after your procedure. Your health care provider may also give you more specific instructions. If you have problems or questions, contact your health care provider. What can I expect after the procedure? After the procedure, it is common to have:  Itching.  Discomfort.  Fluid discharge.  Sensitivity to light and to touch.  Bruising in or around the eye.  Mild blurred vision. Follow these instructions at home: Eye care   Do not touch or rub your eyes.  Protect your eyes as told by your health care provider. You  may be told to wear a protective eye shield or sunglasses.  Do not put a contact lens into the affected eye or eyes until your health care provider approves.  Keep the area around your eye clean and dry: ? Avoid swimming. ? Do not allow water to hit you directly in the face while showering. ? Keep soap and shampoo out of your eyes.  Check your eye every day for signs of infection. Watch for: ? Redness, swelling, or pain. ? Fluid, blood, or pus. ? Warmth. ? A bad smell. ? Vision that is getting worse. ? Sensitivity that is getting worse. Activity  Do not drive for 24 hours if you were given a sedative during your procedure.  Avoid strenuous activities, such as playing contact sports, for as long as told by your health care provider.  Do not drive or use heavy machinery until your health care provider approves.  Do not bend or lift heavy objects. Bending increases pressure in the eye. You can walk, climb stairs, and do light household chores.  Ask your health care provider when you can return to work. If you work in a dusty environment, you may be advised to wear protective eyewear for a period of time. General instructions  Take or apply over-the-counter and  prescription medicines only as told by your health care provider. This includes eye drops.  Keep all follow-up visits as told by your health care provider. This is important. Contact a health care provider if:  You have increased bruising around your eye.  You have pain that is not helped with medicine.  You have a fever.  You have redness, swelling, or pain in your eye.  You have fluid, blood, or pus coming from your incision.  Your vision gets worse.  Your sensitivity to light gets worse. Get help right away if:  You have sudden loss of vision.  You see flashes of light or spots (floaters).  You have severe eye pain.  You develop nausea or vomiting. Summary  After your procedure, it is common to have  itching, discomfort, bruising, fluid discharge, or sensitivity to light.  Follow instructions from your health care provider about caring for your eye after the procedure.  Do not rub your eye after the procedure. You may need to wear eye protection or sunglasses. Do not wear contact lenses. Keep the area around your eye clean and dry.  Avoid activities that require a lot of effort. These include playing sports and lifting heavy objects.  Contact a health care provider if you have increased bruising, pain that does not go away, or a fever. Get help right away if you suddenly lose your vision, see flashes of light or spots, or have severe pain in the eye. This information is not intended to replace advice given to you by your health care provider. Make sure you discuss any questions you have with your health care provider. Document Revised: 05/22/2019 Document Reviewed: 01/23/2018 Elsevier Patient Education  2020 [place] After These instructions provide you with information about caring for yourself after your procedure. Your health care provider may also give you more specific instructions. Your treatment has been planned according to current medical practices, but problems sometimes occur. Call your health care provider if you have any problems or questions after your procedure. What can I expect after the procedure? After your procedure, you may:  Feel sleepy for several hours.  Feel clumsy and have poor balance for several hours.  Feel forgetful about what happened after the procedure.  Have poor judgment for several hours.  Feel nauseous or vomit.  Have a sore throat if you had a breathing tube during the procedure. Follow these instructions at home: For at least 24 hours after the procedure:      Have a responsible adult stay with you. It is important to have someone help care for you until you are awake and alert.  Rest as needed.  Do  not: ? Participate in activities in which you could fall or become injured. ? Drive. ? Use heavy machinery. ? Drink alcohol. ? Take sleeping pills or medicines that cause drowsiness. ? Make important decisions or sign legal documents. ? Take care of children on your own. Eating and drinking  Follow the diet that is recommended by your health care provider.  If you vomit, drink water, juice, or soup when you can drink without vomiting.  Make sure you have little or no nausea before eating solid foods. General instructions  Take over-the-counter and prescription medicines only as told by your health care provider.  If you have sleep apnea, surgery and certain medicines can increase your risk for breathing problems. Follow instructions from your health care provider about wearing your sleep device: ? Anytime  you are sleeping, including during daytime naps. ? While taking prescription pain medicines, sleeping medicines, or medicines that make you drowsy.  If you smoke, do not smoke without supervision.  Keep all follow-up visits as told by your health care provider. This is important. Contact a health care provider if:  You keep feeling nauseous or you keep vomiting.  You feel light-headed.  You develop a rash.  You have a fever. Get help right away if:  You have trouble breathing. Summary  For several hours after your procedure, you may feel sleepy and have poor judgment.  Have a responsible adult stay with you for at least 24 hours or until you are awake and alert. This information is not intended to replace advice given to you by your health care provider. Make sure you discuss any questions you have with your health care provider. Document Revised: 10/24/2017 Document Reviewed: 11/16/2015 Elsevier Patient Education  [place].

## 2020-03-03 ENCOUNTER — Telehealth: Payer: Self-pay | Admitting: Family Medicine

## 2020-03-03 NOTE — Telephone Encounter (Signed)
Pt was prescribed azithromycin (ZITHROMAX Z-PAK) 250 MG t its clearing up but not completley clear. Pt is wanting another med called in.  Pt call back [phone]

## 2020-03-04 MED ORDER — DOXYCYCLINE HYCLATE 100 MG PO TABS
100.0000 mg | ORAL_TABLET | Freq: Two times a day (BID) | ORAL | 0 refills | Status: AC
Start: 2020-03-04 — End: 2020-03-11

## 2020-03-04 NOTE — Telephone Encounter (Signed)
Doxycycline 100 mg 1 twice daily for 7 days follow-up if ongoing troubles

## 2020-03-04 NOTE — Telephone Encounter (Signed)
Patient notified and rx sent. Patient also wanted to let you know that since starting Crestor she has no energy. No Joint problems or any other issues. Patient wants to know if she could cut med in half and take half per day.

## 2020-03-04 NOTE — Telephone Encounter (Signed)
That would be acceptable

## 2020-03-05 ENCOUNTER — Other Ambulatory Visit (HOSPITAL_COMMUNITY)
Admission: RE | Admit: 2020-03-05 | Discharge: 2020-03-05 | Disposition: A | Discharge status: Home / Self Care | Payer: BC Managed Care – PPO | Source: Ambulatory Visit | Attending: Ophthalmology | Admitting: Ophthalmology

## 2020-03-05 ENCOUNTER — Other Ambulatory Visit: Payer: Self-pay

## 2020-03-05 ENCOUNTER — Encounter (HOSPITAL_COMMUNITY)
Admission: RE | Admit: 2020-03-05 | Discharge: 2020-03-05 | Disposition: A | Discharge status: Home / Self Care | Payer: BC Managed Care – PPO | Source: Ambulatory Visit | Attending: Ophthalmology | Admitting: Ophthalmology

## 2020-03-05 ENCOUNTER — Encounter (HOSPITAL_COMMUNITY): Payer: Self-pay

## 2020-03-05 DIAGNOSIS — Z20822 Contact with and (suspected) exposure to covid-19: Secondary | ICD-10-CM | POA: Insufficient documentation

## 2020-03-05 DIAGNOSIS — Z01812 Encounter for preprocedural laboratory examination: Secondary | ICD-10-CM | POA: Insufficient documentation

## 2020-03-05 HISTORY — DX: Pure hypercholesterolemia, unspecified: E78.00

## 2020-03-05 HISTORY — DX: Gastro-esophageal reflux disease without esophagitis: K21.9

## 2020-03-05 LAB — SARS CORONAVIRUS 2 (TAT 6-24 HRS): SARS Coronavirus 2: NEGATIVE

## 2020-03-05 NOTE — Telephone Encounter (Signed)
Patient notified

## 2020-03-06 MED ORDER — LIDOCAINE HCL 3.5 % OP GEL
OPHTHALMIC | Status: AC
  Filled 2020-03-06: qty 1

## 2020-03-06 MED ORDER — TETRACAINE HCL 0.5 % OP SOLN
OPHTHALMIC | Status: AC
  Filled 2020-03-06: qty 4

## 2020-03-06 MED ORDER — LIDOCAINE HCL (PF) 1 % IJ SOLN
INTRAMUSCULAR | Status: AC
  Filled 2020-03-06: qty 2

## 2020-03-06 MED ORDER — NEOMYCIN-POLYMYXIN-DEXAMETH 3.5-10000-0.1 OP SUSP
OPHTHALMIC | Status: AC
  Filled 2020-03-06: qty 5

## 2020-03-06 MED ORDER — PHENYLEPHRINE HCL 2.5 % OP SOLN
OPHTHALMIC | Status: AC
  Filled 2020-03-06: qty 15

## 2020-03-06 MED ORDER — CYCLOPENTOLATE-PHENYLEPHRINE 0.2-1 % OP SOLN
OPHTHALMIC | Status: AC
  Filled 2020-03-06: qty 2

## 2020-03-07 ENCOUNTER — Encounter (HOSPITAL_COMMUNITY): Payer: Self-pay | Admitting: Ophthalmology

## 2020-03-07 ENCOUNTER — Ambulatory Visit (HOSPITAL_COMMUNITY): Payer: BC Managed Care – PPO | Admitting: Anesthesiology

## 2020-03-07 ENCOUNTER — Ambulatory Visit (HOSPITAL_COMMUNITY)
Admission: RE | Admit: 2020-03-07 | Discharge: 2020-03-07 | Disposition: A | Discharge status: Home / Self Care | Payer: BC Managed Care – PPO | Attending: Ophthalmology | Admitting: Ophthalmology

## 2020-03-07 ENCOUNTER — Encounter (HOSPITAL_COMMUNITY)
Admission: RE | Disposition: A | Discharge status: Home / Self Care | Payer: Self-pay | Source: Home / Self Care | Attending: Ophthalmology

## 2020-03-07 DIAGNOSIS — H16223 Keratoconjunctivitis sicca, not specified as Sjogren's, bilateral: Secondary | ICD-10-CM | POA: Insufficient documentation

## 2020-03-07 DIAGNOSIS — H2511 Age-related nuclear cataract, right eye: Secondary | ICD-10-CM | POA: Diagnosis not present

## 2020-03-07 DIAGNOSIS — F419 Anxiety disorder, unspecified: Secondary | ICD-10-CM | POA: Diagnosis not present

## 2020-03-07 DIAGNOSIS — Z79899 Other long term (current) drug therapy: Secondary | ICD-10-CM | POA: Diagnosis not present

## 2020-03-07 DIAGNOSIS — Z981 Arthrodesis status: Secondary | ICD-10-CM | POA: Diagnosis not present

## 2020-03-07 DIAGNOSIS — I2510 Atherosclerotic heart disease of native coronary artery without angina pectoris: Secondary | ICD-10-CM | POA: Diagnosis not present

## 2020-03-07 DIAGNOSIS — Z882 Allergy status to sulfonamides status: Secondary | ICD-10-CM | POA: Diagnosis not present

## 2020-03-07 DIAGNOSIS — K219 Gastro-esophageal reflux disease without esophagitis: Secondary | ICD-10-CM | POA: Insufficient documentation

## 2020-03-07 DIAGNOSIS — J449 Chronic obstructive pulmonary disease, unspecified: Secondary | ICD-10-CM | POA: Insufficient documentation

## 2020-03-07 DIAGNOSIS — F17210 Nicotine dependence, cigarettes, uncomplicated: Secondary | ICD-10-CM | POA: Diagnosis not present

## 2020-03-07 DIAGNOSIS — H40053 Ocular hypertension, bilateral: Secondary | ICD-10-CM | POA: Insufficient documentation

## 2020-03-07 HISTORY — PX: CATARACT EXTRACTION W/PHACO: SHX586

## 2020-03-07 SURGERY — PHACOEMULSIFICATION, CATARACT, WITH IOL INSERTION
Anesthesia: Monitor Anesthesia Care | Site: Eye | Laterality: Right

## 2020-03-07 MED ORDER — LIDOCAINE HCL 3.5 % OP GEL
1.0000 "application " | Freq: Once | OPHTHALMIC | Status: AC
  Administered 2020-03-07: 1 via OPHTHALMIC

## 2020-03-07 MED ORDER — MIDAZOLAM HCL 5 MG/5ML IJ SOLN
INTRAMUSCULAR | Status: DC | PRN
  Administered 2020-03-07: 2 mg via INTRAVENOUS

## 2020-03-07 MED ORDER — LACTATED RINGERS IV SOLN: INTRAVENOUS | Status: DC

## 2020-03-07 MED ORDER — SODIUM HYALURONATE 23 MG/ML IO SOLN
INTRAOCULAR | Status: DC | PRN
  Administered 2020-03-07: 0.6 mL via INTRAOCULAR

## 2020-03-07 MED ORDER — POVIDONE-IODINE 5 % OP SOLN
OPHTHALMIC | Status: DC | PRN
  Administered 2020-03-07: 1 via OPHTHALMIC

## 2020-03-07 MED ORDER — BSS IO SOLN
INTRAOCULAR | Status: DC | PRN
  Administered 2020-03-07: 15 mL via INTRAOCULAR

## 2020-03-07 MED ORDER — PHENYLEPHRINE HCL 2.5 % OP SOLN
1.0000 [drp] | OPHTHALMIC | Status: AC | PRN
  Administered 2020-03-07 (×3): 1 [drp] via OPHTHALMIC

## 2020-03-07 MED ORDER — LIDOCAINE HCL (PF) 1 % IJ SOLN
INTRAOCULAR | Status: DC | PRN
  Administered 2020-03-07: 1 mL via OPHTHALMIC

## 2020-03-07 MED ORDER — CYCLOPENTOLATE-PHENYLEPHRINE 0.2-1 % OP SOLN
1.0000 [drp] | OPHTHALMIC | Status: AC | PRN
  Administered 2020-03-07 (×3): 1 [drp] via OPHTHALMIC

## 2020-03-07 MED ORDER — EPINEPHRINE PF 1 MG/ML IJ SOLN
INTRAOCULAR | Status: DC | PRN
  Administered 2020-03-07: 500 mL

## 2020-03-07 MED ORDER — TETRACAINE HCL 0.5 % OP SOLN
1.0000 [drp] | OPHTHALMIC | Status: AC | PRN
  Administered 2020-03-07 (×3): 1 [drp] via OPHTHALMIC

## 2020-03-07 MED ORDER — PROVISC 10 MG/ML IO SOLN
INTRAOCULAR | Status: DC | PRN
  Administered 2020-03-07: 0.85 mL via INTRAOCULAR

## 2020-03-07 MED ORDER — MIDAZOLAM HCL 2 MG/2ML IJ SOLN
INTRAMUSCULAR | Status: AC
  Filled 2020-03-07: qty 2

## 2020-03-07 SURGICAL SUPPLY — 15 items
CLOTH BEACON ORANGE TIMEOUT ST (SAFETY) ×1 IMPLANT
EYE SHIELD UNIVERSAL CLEAR (GAUZE/BANDAGES/DRESSINGS) ×1 IMPLANT
GLOVE BIOGEL PI IND STRL 7.0 (GLOVE) IMPLANT
GLOVE BIOGEL PI INDICATOR 7.0 (GLOVE) ×2
NDL HYPO 18GX1.5 BLUNT FILL (NEEDLE) IMPLANT
NEEDLE HYPO 18GX1.5 BLUNT FILL (NEEDLE) ×2 IMPLANT
PAD ARMBOARD 7.5X6 YLW CONV (MISCELLANEOUS) ×1 IMPLANT
PROC W SPEC LENS (INTRAOCULAR LENS) ×2
PROCESS W SPEC LENS (INTRAOCULAR LENS) IMPLANT
SYR TB 1ML LL NO SAFETY (SYRINGE) ×1 IMPLANT
TAPE SURG TRANSPORE 1 IN (GAUZE/BANDAGES/DRESSINGS) IMPLANT
TAPE SURGICAL TRANSPORE 1 IN (GAUZE/BANDAGES/DRESSINGS) ×2
Tecnis Toric Symfony (Intraocular Lens) ×1 IMPLANT
VISCOELASTIC ADDITIONAL (OPHTHALMIC RELATED) ×1 IMPLANT
WATER STERILE IRR 250ML POUR (IV SOLUTION) ×1 IMPLANT

## 2020-03-07 NOTE — Discharge Instructions (Signed)
Please discharge patient when stable, will follow up today with Dr. Kadian Barcellos at the [facility] [place] office immediately following discharge.  Leave shield in place until visit.  All paperwork with discharge instructions will be given at the office.  [facility] Address:  [address]  [place],  [address]  

## 2020-03-07 NOTE — Anesthesia Postprocedure Evaluation (Signed)
Anesthesia Post Note  Patient: SIMRAT KENDRICK  Procedure(s) Performed: CATARACT EXTRACTION PHACO AND INTRAOCULAR LENS PLACEMENT RIGHT EYE (Right Eye)  Patient location during evaluation: Short Stay Anesthesia Type: MAC Level of consciousness: awake, oriented, awake and alert and patient cooperative Pain management: pain level controlled Vital Signs Assessment: post-procedure vital signs reviewed and stable Respiratory status: spontaneous breathing, respiratory function stable and nonlabored ventilation Cardiovascular status: blood pressure returned to baseline and stable Postop Assessment: no headache and no backache Anesthetic complications: no   No complications documented.   Last Vitals:  Vitals:   03/07/20 0726  BP: (!) 138/86  Pulse: 69  Resp: 18  Temp: 36.8 C  SpO2: 98%    Last Pain:  Vitals:   03/07/20 0726  TempSrc: Oral  PainSc: 0-No pain                 Tacy Learn

## 2020-03-07 NOTE — Anesthesia Preprocedure Evaluation (Signed)
Anesthesia Evaluation  Patient identified by MRN, date of birth, ID band Patient awake    Reviewed: Allergy & Precautions, NPO status , Patient's Chart, lab work & pertinent test results  Airway Mallampati: II  TM Distance: >3 FB Neck ROM: Full   Comment: Neck fusion Dental  (+) Dental Advisory Given, Caps Crowns, fillings :   Pulmonary COPD, Current SmokerPatient did not abstain from smoking.,    Pulmonary exam normal breath sounds clear to auscultation       Cardiovascular Exercise Tolerance: Good + CAD (tachycardia)  Normal cardiovascular exam Rhythm:Regular Rate:Normal     Neuro/Psych Anxiety negative neurological ROS     GI/Hepatic Neg liver ROS, GERD  Medicated and Controlled,  Endo/Other  negative endocrine ROS  Renal/GU negative Renal ROS     Musculoskeletal  (+) Arthritis  (neck fusion),   Abdominal   Peds  Hematology negative hematology ROS (+)   Anesthesia Other Findings   Reproductive/Obstetrics negative OB ROS                             Anesthesia Physical Anesthesia Plan  ASA: II  Anesthesia Plan: MAC   Post-op Pain Management:    Induction:   PONV Risk Score and Plan:   Airway Management Planned: Nasal Cannula and Natural Airway  Additional Equipment:   Intra-op Plan:   Post-operative Plan:   Informed Consent: I have reviewed the patients History and Physical, chart, labs and discussed the procedure including the risks, benefits and alternatives for the proposed anesthesia with the patient or authorized representative who has indicated his/her understanding and acceptance.     Dental advisory given  Plan Discussed with: CRNA and Surgeon  Anesthesia Plan Comments:         Anesthesia Quick Evaluation

## 2020-03-07 NOTE — Op Note (Addendum)
Date of procedure: 03/07/20  Pre-operative diagnosis: Visually significant age-related nuclear cataract, Right Eye (H25.11)  Post-operative diagnosis: Visually significant age-related nuclear cataract, Right Eye  Procedure: Removal of cataract via phacoemulsification and insertion of intra-ocular lens Wynetta Emery and [employer] ZXR00  +22.0D into the capsular bag of the Right Eye  Attending surgeon: Gerda Diss. Faron Tudisco, MD, MA  Anesthesia: MAC, Topical Akten  Complications: None  Estimated Blood Loss: <93m (minimal)  Specimens: None  Implants: As above  Indications:  Visually significant age-related cataract, Right Eye  Procedure:  The patient was seen and identified in the pre-operative area. The operative eye was identified and dilated.  The operative eye was marked.  Topical anesthesia was administered to the operative eye.     The patient was then to the operative suite and placed in the supine position.  A timeout was performed confirming the patient, procedure to be performed, and all other relevant information.   The patient's face was prepped and draped in the usual fashion for intra-ocular surgery.  A lid speculum was placed into the operative eye and the surgical microscope moved into place and focused.  A superotemporal paracentesis was created using a 20 gauge paracentesis blade.  Shugarcaine was injected into the anterior chamber.  Viscoelastic was injected into the anterior chamber.  A temporal clear-corneal main wound incision was created using a 2.436mmicrokeratome.  A continuous curvilinear capsulorrhexis was initiated using an irrigating cystitome and completed using capsulorrhexis forceps.  Hydrodissection and hydrodeliniation were performed.  Viscoelastic was injected into the anterior chamber.  A phacoemulsification handpiece and a chopper as a second instrument were used to remove the nucleus and epinucleus. The irrigation/aspiration handpiece was used to remove any  remaining cortical material.   The capsular bag was reinflated with viscoelastic, checked, and found to be intact.  The intraocular lens was inserted into the capsular bag.  The irrigation/aspiration handpiece was used to remove any remaining viscoelastic.  The clear corneal wound and paracentesis wounds were then hydrated and checked with Weck-Cels to be watertight.  The lid-speculum and drape was removed, and the patient's face was cleaned with a wet and dry 4x4.  A clear shield was taped over the eye. The patient was taken to the post-operative care unit in good condition, having tolerated the procedure well.  Post-Op Instructions: [facility] and will receive all other orders and instructions.

## 2020-03-07 NOTE — Transfer of Care (Signed)
Immediate Anesthesia Transfer of Care Note  Patient: Isabel Atkinson  Procedure(s) Performed: CATARACT EXTRACTION PHACO AND INTRAOCULAR LENS PLACEMENT RIGHT EYE (Right Eye)  Patient Location: PACU and Short Stay  Anesthesia Type:MAC  Level of Consciousness: awake, alert , oriented and patient cooperative  Airway & Oxygen Therapy: Patient Spontanous Breathing  Post-op Assessment: Report given to RN, Post -op Vital signs reviewed and stable and Patient moving all extremities  Post vital signs: Reviewed and stable  Last Vitals:  Vitals Value Taken Time  BP    Temp    Pulse    Resp    SpO2      Last Pain:  Vitals:   03/07/20 0726  TempSrc: Oral  PainSc: 0-No pain      Patients Stated Pain Goal: 5 (97/28/20 6015)  Complications: No complications documented.

## 2020-03-07 NOTE — Interval H&P Note (Signed)
History and Physical Interval Note:  03/07/2020 7:55 AM  Isabel Atkinson  has presented today for surgery, with the diagnosis of Nuclear sclerotic cataract - Right eye.  The various methods of treatment have been discussed with the patient and family. After consideration of risks, benefits and other options for treatment, the patient has consented to  Procedure(s) with comments: CATARACT EXTRACTION PHACO AND INTRAOCULAR LENS PLACEMENT (IOC) (Right) - right as a surgical intervention.  The patient's history has been reviewed, patient examined, no change in status, stable for surgery.  I have reviewed the patient's chart and labs.  Questions were answered to the patient's satisfaction.     Baruch Goldmann

## 2020-03-10 ENCOUNTER — Encounter (HOSPITAL_COMMUNITY): Payer: Self-pay | Admitting: Ophthalmology

## 2020-03-15 ENCOUNTER — Telehealth: Payer: Self-pay | Admitting: Family Medicine

## 2020-03-15 NOTE — Telephone Encounter (Signed)
Please let the patient know that I did communicate with her cardiologist Dr. Harrington Challenger They do not recommend any type of catheterization or CT scan of the coronary arteries They do recommend for her to quit smoking keep cholesterol under control.  Please keep all regular follow-up visits thank you

## 2020-03-17 NOTE — Telephone Encounter (Signed)
Pt returned call and verbalized understanding  

## 2020-03-17 NOTE — Telephone Encounter (Signed)
Left message to return call 

## 2020-04-06 ENCOUNTER — Ambulatory Visit
Admission: RE | Admit: 2020-04-06 | Discharge: 2020-04-06 | Disposition: A | Discharge status: Home / Self Care | Payer: BC Managed Care – PPO | Source: Ambulatory Visit | Attending: Emergency Medicine | Admitting: Emergency Medicine

## 2020-04-06 ENCOUNTER — Other Ambulatory Visit: Payer: Self-pay

## 2020-04-06 VITALS — Temp 98.2°F

## 2020-04-06 DIAGNOSIS — Z20822 Contact with and (suspected) exposure to covid-19: Secondary | ICD-10-CM

## 2020-04-06 NOTE — ED Triage Notes (Signed)
covid test no s/s

## 2020-04-06 NOTE — Discharge Instructions (Signed)

## 2020-04-07 LAB — NOVEL CORONAVIRUS, NAA: SARS-CoV-2, NAA: NOT DETECTED

## 2020-04-21 ENCOUNTER — Other Ambulatory Visit (HOSPITAL_COMMUNITY): Payer: Self-pay | Admitting: Family Medicine

## 2020-04-21 DIAGNOSIS — Z1231 Encounter for screening mammogram for malignant neoplasm of breast: Secondary | ICD-10-CM

## 2020-05-05 ENCOUNTER — Ambulatory Visit (HOSPITAL_COMMUNITY): Payer: BC Managed Care – PPO

## 2020-05-08 ENCOUNTER — Other Ambulatory Visit: Payer: Self-pay

## 2020-05-08 ENCOUNTER — Encounter: Payer: Self-pay | Admitting: Family Medicine

## 2020-05-08 ENCOUNTER — Ambulatory Visit (INDEPENDENT_AMBULATORY_CARE_PROVIDER_SITE_OTHER): Payer: BC Managed Care – PPO | Admitting: Family Medicine

## 2020-05-08 VITALS — BP 132/80 | HR 86 | Temp 97.6°F | Wt 102.2 lb

## 2020-05-08 DIAGNOSIS — R350 Frequency of micturition: Secondary | ICD-10-CM | POA: Diagnosis not present

## 2020-05-08 DIAGNOSIS — M7711 Lateral epicondylitis, right elbow: Secondary | ICD-10-CM | POA: Diagnosis not present

## 2020-05-08 LAB — POCT URINALYSIS DIPSTICK (MANUAL)
Leukocytes, UA: NEGATIVE
Nitrite, UA: NEGATIVE
Poct Blood: NEGATIVE
Poct Glucose: NORMAL mg/dL
Poct Ketones: NEGATIVE
Poct Protein: 30 mg/dL — AB
Poct Urobilinogen: 1 mg/dL — AB
Spec Grav, UA: 1.01 (ref 1.010–1.025)
pH, UA: 5 (ref 5.0–8.0)

## 2020-05-08 NOTE — Progress Notes (Addendum)
   Subjective:    Patient ID: Isabel Atkinson, female    DOB: 11-02-1965, 54 y.o.   MRN: [id number]  HPI Patient comes in today with continued right elbow pain.  Requesting injection. Right elbow pain and discomfort hurts with movement hurts with gripping. Denies any injury to it. Has been evaluated previously for this. Has had an injection previously for this. 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 Denies any other trouble.   Review of Systems Elbow pain and discomfort no shoulder pain no wrist pain no numbness tingling    Objective:   Physical Exam Tenderness on the lateral epicondyle consistent with lateral epicondylitis increased pain with certain movements and gripping recommend combination of cool compresses and patient request injection Injection of 1 mL of Depo-Medrol in addition to this 1 mL of lidocaine without epinephrine  Urinalysis under the microscope appears negative    It should be noted that the injection was done under sterile technique care was taken not to inject the tendon Assessment & Plan:  Steroid injection Patient to notify us over the next 30 days how she is doing No strenuous activity over the next several days If progressive troubles or problems notify us Referral for orthopedic evaluation if not doing better in the next several weeks

## 2020-05-08 NOTE — Patient Instructions (Signed)
Tennis Elbow Rehab Ask your health care provider which exercises are safe for you. Do exercises exactly as told by your health care provider and adjust them as directed. It is normal to feel mild stretching, pulling, tightness, or discomfort as you do these exercises. Stop right away if you feel sudden pain or your pain gets worse. Do not begin these exercises until told by your health care provider. Stretching and range-of-motion exercises These exercises warm up your muscles and joints and improve the movement and flexibility of your elbow. These exercises also help to relieve pain, numbness, and tingling. Wrist flexion, assisted  1. Straighten your left / right elbow in front of you with your palm facing down toward the floor. ? If told by your health care provider, bend your left / right elbow to a 90-degree angle (right angle) at your side. 2. With your other hand, gently push over the back of your left / right hand so your fingers point toward the floor (flexion). Stop when you feel a gentle stretch on the back of your forearm. 3. Hold this position for __________ seconds. Repeat __________ times. Complete this exercise __________ times a day. Wrist extension, assisted  1. Straighten your left / right elbow in front of you with your palm facing up toward the ceiling. ? If told by your health care provider, bend your left / right elbow to a 90-degree angle (right angle) at your side. 2. With your other hand, gently pull your left / right hand and fingers toward the floor (extension). Stop when you feel a gentle stretch on the palm side of your forearm. 3. Hold this position for __________ seconds. Repeat __________ times. Complete this exercise __________ times a day. Assisted forearm rotation, supination 1. Sit or stand with your left / right elbow bent to a 90-degree angle (right angle) at your side. 2. Using your uninjured hand, turn (rotate) your left / right palm up toward the ceiling  (supination) until you feel a gentle stretch along the inside of your forearm. 3. Hold this position for __________ seconds. Repeat __________ times. Complete this exercise __________ times a day. Assisted forearm rotation, pronation 1. Sit or stand with your left / right elbow bent to a 90-degree angle (right angle) at your side. 2. Using your uninjured hand, rotate your left / right palm down toward the floor (pronation) until you feel a gentle stretch along the outside of your forearm. 3. Hold this position for __________ seconds. Repeat __________ times. Complete this exercise __________ times a day. Strengthening exercises These exercises build strength and endurance in your forearm and elbow. Endurance is the ability to use your muscles for a long time, even after they get tired. Radial deviation  1. Stand with a __________ weight or a hammer in your left / right hand. Or, sit while holding a rubber exercise band or tubing, with your left / right forearm supported on a table or countertop. ? If you are standing, position your forearm so that your thumb is facing forward. If you are sitting, position your forearm so that the thumb is facing the ceiling. This is the neutral position. 2. Raise your hand upward in front of you so your thumb moves toward the ceiling (radial deviation), or pull up on the rubber tubing. Keep your forearm and elbow still while you move your wrist only. 3. Hold this position for __________ seconds. 4. Slowly return to the starting position. Repeat __________ times. Complete this exercise __________ times  a day. °Wrist extension, eccentric °1. Sit with your left / right forearm palm-down and supported on a table or other surface. Let your left / right wrist extend over the edge of the surface. °2. Hold a __________ weight or a piece of exercise band or tubing in your left / right hand. °? If using a rubber exercise band or tubing, hold the other end of the tubing with  your other hand. °3. Use your uninjured hand to move your left / right hand up toward the ceiling. °4. Take your uninjured hand away and slowly return to the starting position using only your left / right hand. Lowering your arm under tension is called eccentric extension. °Repeat __________ times. Complete this exercise __________ times a day. °Wrist extension °Do not do this exercise if it causes pain at the outside of your elbow. Only do this exercise once instructed by your health care provider. °1. Sit with your left / right forearm supported on a table or other surface and your palm turned down toward the floor. Let your left / right wrist extend over the edge of the surface. °2. Hold a __________ weight or a piece of rubber exercise band or tubing. °? If you are using a rubber exercise band or tubing, hold the band or tubing in place with your other hand to provide resistance. °3. Slowly bend your wrist so your hand moves up toward the ceiling (extension). Move only your wrist, keeping your forearm and elbow still. °4. Hold this position for __________ seconds. °5. Slowly return to the starting position. °Repeat __________ times. Complete this exercise __________ times a day. °Forearm rotation, supination °To do this exercise, you will need a lightweight hammer or rubber mallet. °1. Sit with your left / right forearm supported on a table or other surface. Bend your elbow to a 90-degree angle (right angle). Position your forearm so that your palm is facing down toward the floor, with your hand resting over the edge of the table. °2. Hold a hammer in your left / right hand. °? To make this exercise easier, hold the hammer near the head of the hammer. °? To make this exercise harder, hold the hammer near the end of the handle. °3. Without moving your wrist or elbow, slowly rotate your forearm so your palm faces up toward the ceiling (supination). °4. Hold this position for __________ seconds. °5. Slowly return  to the starting position. °Repeat __________ times. Complete this exercise __________ times a day. °Shoulder blade squeeze °1. Sit in a stable chair or stand with good posture. If you are sitting down, do not let your back touch the back of the chair. °2. Your arms should be at your sides with your elbows bent to a 90-degree angle (right angle). Position your forearms so that your thumbs are facing the ceiling (neutral position). °3. Without lifting your shoulders up, squeeze your shoulder blades tightly together. °4. Hold this position for __________ seconds. °5. Slowly release and return to the starting position. °Repeat __________ times. Complete this exercise __________ times a day. °This information is not intended to replace advice given to you by your health care provider. Make sure you discuss any questions you have with your health care provider. °Document Revised: 11/16/2018 Document Reviewed: 09/19/2018 °Elsevier Patient Education © 2020 [employer] ° °

## 2020-06-21 LAB — HEPATIC FUNCTION PANEL
ALT: 11 IU/L (ref 0–32)
AST: 15 IU/L (ref 0–40)
Albumin: 5 g/dL — ABNORMAL HIGH (ref 3.8–4.9)
Alkaline Phosphatase: 89 IU/L (ref 44–121)
Bilirubin Total: 0.3 mg/dL (ref 0.0–1.2)
Bilirubin, Direct: 0.12 mg/dL (ref 0.00–0.40)
Total Protein: 7.2 g/dL (ref 6.0–8.5)

## 2020-06-21 LAB — LIPID PANEL
Chol/HDL Ratio: 2.3 ratio (ref 0.0–4.4)
Cholesterol, Total: 182 mg/dL (ref 100–199)
HDL: 78 mg/dL (ref >39)
LDL Chol Calc (NIH): 94 mg/dL (ref 0–99)
Triglycerides: 50 mg/dL (ref 0–149)
VLDL Cholesterol Cal: 10 mg/dL (ref 5–40)

## 2020-06-23 ENCOUNTER — Encounter: Payer: Self-pay | Admitting: Family Medicine

## 2020-06-23 ENCOUNTER — Ambulatory Visit (INDEPENDENT_AMBULATORY_CARE_PROVIDER_SITE_OTHER): Payer: BC Managed Care – PPO | Admitting: Family Medicine

## 2020-06-23 ENCOUNTER — Other Ambulatory Visit: Payer: Self-pay

## 2020-06-23 VITALS — HR 97 | Temp 97.6°F | Resp 16

## 2020-06-23 DIAGNOSIS — J019 Acute sinusitis, unspecified: Secondary | ICD-10-CM | POA: Diagnosis not present

## 2020-06-23 MED ORDER — DOXYCYCLINE HYCLATE 100 MG PO TABS: 100.0000 mg | ORAL_TABLET | Freq: Two times a day (BID) | ORAL | 0 refills | Status: DC

## 2020-06-23 NOTE — Progress Notes (Signed)
Patient ID: Isabel Atkinson, female    DOB: 1966/02/15, 54 y.o.   MRN: [id number]   Chief Complaint  Patient presents with  . Cough    covid test negative yesterday   Subjective:  CC: headache, sore throat, runny nose and nausea  This is a new problem.  Presents today with complaint of headache, sore throat, runny nose, and nausea.  Symptoms started yesterday.  Pertinent negatives include no fever, no chills, no fatigue, no chest pain, no shortness of breath, no ear pain.  Associated symptoms include congestion, cough, sore throat.  She has had some abdominal pain with nausea.  Reports that her face and head hurt and she has an unusual smell and taste.  She does have a family member in her home that he tested positive for Covid on Wednesday.  Her son has been trying to isolate himself from the family however they do live together.  She does have a history of sinus infections.  She is able to keep fluids down with adequate hydration.  Cough This is a new problem. Episode onset: Yesterday. The cough is productive of sputum. Associated symptoms include headaches, nasal congestion, rhinorrhea and a sore throat. Pertinent negatives include no chest pain, chills, fever or shortness of breath. Associated symptoms comments: Patient had negative At Home Covid test yesterday . Treatments tried: advil cold and sinus.     Medical History Athaliah has a past medical history of Anxiety, GERD (gastroesophageal reflux disease), Hypercholesterolemia, Pre-diabetes, Reactive airway disease, and Tachycardia.   Outpatient Encounter Medications as of 06/23/2020  Medication Sig  . acetaminophen (TYLENOL) 500 MG tablet Take 500 mg by mouth every 6 (six) hours as needed for moderate pain or headache.  . ALPRAZolam (XANAX) 0.25 MG tablet TAKE 1/2 TO 1 TABLET BY MOUTH TWICE DAILY AS NEEDED FOR ANXIETY. (Patient taking differently: Take 0.625 mg by mouth daily as needed for anxiety. )  . Ascorbic Acid (VITAMIN C PO)  Take 1 tablet by mouth daily. With elderberry  . aspirin EC 81 MG tablet Take 81 mg by mouth daily. Swallow whole.  Marland Kitchen CALCIUM PO Take 750 mg by mouth daily.  . cycloSPORINE (RESTASIS) 0.05 % ophthalmic emulsion Place 1 drop into both eyes 2 (two) times daily.   Marland Kitchen doxycycline (VIBRA-TABS) 100 MG tablet Take 1 tablet (100 mg total) by mouth 2 (two) times daily.  Marland Kitchen ibuprofen (ADVIL) 200 MG tablet Take 200 mg by mouth every 6 (six) hours as needed for headache or moderate pain.  . Multiple Vitamins-Minerals (MULTIVITAMIN WITH MINERALS) tablet Take 1 tablet by mouth daily.   Marland Kitchen omeprazole (PRILOSEC) 40 MG capsule TAKE ONE CAPSULE BY MOUTH ONCE DAILY. (Patient taking differently: Take 40 mg by mouth daily. )  . rosuvastatin (CRESTOR) 5 MG tablet Take one tablet po each day  . silver sulfADIAZINE (SILVADENE) 1 % cream Apply BID prn (Patient taking differently: Apply 1 application topically 2 (two) times daily as needed (burns). )  . Triamcinolone Acetonide (NASACORT ALLERGY 24HR NA) Place 2 sprays into the nose daily.   No facility-administered encounter medications on file as of 06/23/2020.     Review of Systems  Constitutional: Negative for chills and fever.  HENT: Positive for congestion, rhinorrhea, sinus pressure, sinus pain and sore throat.   Respiratory: Positive for cough. Negative for shortness of breath.   Cardiovascular: Negative for chest pain.  Gastrointestinal: Positive for abdominal pain and nausea. Negative for diarrhea and vomiting.  Genitourinary: Negative for dysuria.  Neurological:  Positive for headaches.     Vitals Pulse 97   Temp 97.6 F (36.4 C)   Resp 16   SpO2 100%   Objective:   Physical Exam Vitals and nursing note reviewed.  Constitutional:      General: She is not in acute distress.    Appearance: Normal appearance. She is not ill-appearing or toxic-appearing.  HENT:     Right Ear: Tympanic membrane normal.     Left Ear: Tympanic membrane normal.      Nose: Rhinorrhea present.     Right Sinus: Maxillary sinus tenderness and frontal sinus tenderness present.     Left Sinus: Maxillary sinus tenderness and frontal sinus tenderness present.     Mouth/Throat:     Mouth: Mucous membranes are moist.     Pharynx: Oropharynx is clear. No posterior oropharyngeal erythema.  Cardiovascular:     Rate and Rhythm: Normal rate and regular rhythm.     Heart sounds: Normal heart sounds.  Pulmonary:     Effort: Pulmonary effort is normal.     Breath sounds: Normal breath sounds.  Skin:    General: Skin is warm and dry.  Neurological:     Mental Status: She is alert and oriented to person, place, and time.  Psychiatric:        Mood and Affect: Mood normal.        Behavior: Behavior normal.        Thought Content: Thought content normal.        Judgment: Judgment normal.      Assessment and Plan   1. Acute rhinosinusitis - doxycycline (VIBRA-TABS) 100 MG tablet; Take 1 tablet (100 mg total) by mouth 2 (two) times daily.  Dispense: 20 tablet; Refill: 0   Due to significant maxillary and frontal sinus pain and pressure, will treat for acute rhino sinusitis (she has multiple allergies to antibiotic).  It is very possible that she is also going to be positive for Covid, her son tested positive on Wednesday and they live in the same home.  He has tried to isolate himself from the family.  One of her symptoms includes unusual smell and taste.  She declines the need for any medication for nausea and cough.  Recommend supportive therapy with adequate hydration rest and isolation.  Precautions and warning given towards increasing shortness of breath, and going to the emergency department if this occurs.  Agrees with plan of care discussed today. Understands warning signs to seek further care: Chest pain, shortness of breath, any significant changes in health status. Understands to follow-up if symptoms do not improve, or worsen.  Recommend supportive  therapy adequate hydration rest and isolation.  Instructed to not ignore any increasing respiratory symptoms due to possible complications if she is positive for Covid.  Will notify once Covid results are available.  Pecolia Ades, FNP-C

## 2020-06-24 ENCOUNTER — Other Ambulatory Visit: Payer: BC Managed Care – PPO

## 2020-06-25 ENCOUNTER — Telehealth: Payer: Self-pay | Admitting: *Deleted

## 2020-06-25 LAB — NOVEL CORONAVIRUS, NAA: SARS-CoV-2, NAA: NOT DETECTED

## 2020-06-25 LAB — SARS-COV-2, NAA 2 DAY TAT

## 2020-06-25 LAB — SPECIMEN STATUS REPORT

## 2020-06-25 NOTE — Telephone Encounter (Signed)
Patient advised of negative covid test results but wanted some info on blood work results. Patient states she doesn't have an appointment scheduled and will need to call us back to reschedule.

## 2020-06-25 NOTE — Telephone Encounter (Signed)
Patient notified and verbalized understanding. 

## 2020-06-25 NOTE — Telephone Encounter (Signed)
Liver enzymes look good, cholesterol profile LDL below 100, continue rosuvastatin, follow-up when things are doing better if any problems let us know

## 2020-07-06 ENCOUNTER — Emergency Department (HOSPITAL_COMMUNITY): Payer: BC Managed Care – PPO

## 2020-07-06 ENCOUNTER — Emergency Department (HOSPITAL_COMMUNITY)
Admission: EM | Admit: 2020-07-06 | Discharge: 2020-07-06 | Disposition: A | Discharge status: Home / Self Care | Payer: BC Managed Care – PPO | Attending: Emergency Medicine | Admitting: Emergency Medicine

## 2020-07-06 ENCOUNTER — Encounter (HOSPITAL_COMMUNITY): Payer: Self-pay

## 2020-07-06 ENCOUNTER — Other Ambulatory Visit: Payer: Self-pay

## 2020-07-06 DIAGNOSIS — S20211A Contusion of right front wall of thorax, initial encounter: Secondary | ICD-10-CM

## 2020-07-06 DIAGNOSIS — W500XXA Accidental hit or strike by another person, initial encounter: Secondary | ICD-10-CM | POA: Diagnosis not present

## 2020-07-06 DIAGNOSIS — Y33XXXA Other specified events, undetermined intent, initial encounter: Secondary | ICD-10-CM | POA: Diagnosis not present

## 2020-07-06 DIAGNOSIS — F17210 Nicotine dependence, cigarettes, uncomplicated: Secondary | ICD-10-CM | POA: Diagnosis not present

## 2020-07-06 DIAGNOSIS — J449 Chronic obstructive pulmonary disease, unspecified: Secondary | ICD-10-CM | POA: Diagnosis not present

## 2020-07-06 DIAGNOSIS — I2510 Atherosclerotic heart disease of native coronary artery without angina pectoris: Secondary | ICD-10-CM | POA: Diagnosis not present

## 2020-07-06 DIAGNOSIS — R7303 Prediabetes: Secondary | ICD-10-CM | POA: Insufficient documentation

## 2020-07-06 DIAGNOSIS — Z7982 Long term (current) use of aspirin: Secondary | ICD-10-CM | POA: Diagnosis not present

## 2020-07-06 DIAGNOSIS — Z79899 Other long term (current) drug therapy: Secondary | ICD-10-CM | POA: Diagnosis not present

## 2020-07-06 MED ORDER — IBUPROFEN 800 MG PO TABS
800.0000 mg | ORAL_TABLET | Freq: Once | ORAL | Status: AC
  Administered 2020-07-06: 800 mg via ORAL
  Filled 2020-07-06: qty 1

## 2020-07-06 MED ORDER — IBUPROFEN 600 MG PO TABS
600.0000 mg | ORAL_TABLET | Freq: Four times a day (QID) | ORAL | 0 refills | Status: DC | PRN
Start: 2020-07-06 — End: 2021-11-24

## 2020-07-06 NOTE — ED Notes (Signed)
Patient transported to X-ray 

## 2020-07-06 NOTE — ED Triage Notes (Signed)
Pt to er, pt states that her husband went to pick her up and she had a sudden onset of R rib pain. States that it hurts to take a deep breath and sit, denies abuse or neglect at home.

## 2020-07-06 NOTE — Discharge Instructions (Addendum)
Your x-rays are negative for rib fracture.  It is possible your pain is secondary to a rib contusion or cartilage injury which should heal without complication.  I recommend an ice pack as much as is comfortable over the next several days.  You may also add a heating pad starting on Wednesday 20 minutes several times daily.  As discussed use your ibuprofen 400 mg every 8 hours.  You may add Tylenol 650 mg every 4-6 hours which can enhance pain relief.  Get rechecked by your primary doctor if your symptoms are not improving or if you have any worsening symptoms beyond the next 7 to 10 days.

## 2020-07-06 NOTE — ED Provider Notes (Signed)
[facility] EMERGENCY DEPARTMENT Provider Note   CSN: [id number] Arrival date & time: 07/06/20  1429     History Chief Complaint  Patient presents with  . Rib Injury    Isabel Atkinson is a 54 y.o. female with a history of anxiety, GERD, reactive airway disease presenting for evaluation of sudden onset of right sided rib cage pain which occurred just prior to arrival.  Her husband came up behind her to give her a "bear hug" and she experienced sudden onset of right lower rib cage pain in association with a popping sensation.  She has had persistent pain since this occurrence.  She denies shortness of breath but taking a deep breath worsens her pain.  She has had no treatment prior to arrival.  She denies spousal abuse.  Review of chart does not reflect history of frequent injuries.  HPI     Past Medical History:  Diagnosis Date  . Anxiety   . GERD (gastroesophageal reflux disease)   . Hypercholesterolemia   . Pre-diabetes   . Reactive airway disease   . Tachycardia     Patient Active Problem List   Diagnosis Date Noted  . Acute rhinosinusitis 06/23/2020  . Coronary artery disease involving native heart without angina pectoris 02/26/2020  . Aortic atherosclerosis ([place]) 02/26/2020  . Tobacco abuse 12/04/2019  . 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 COPD (chronic obstructive pulmonary disease) ([place]) 06/10/2016  . Abdominal pain, epigastric 05/04/2016  . Gastritis 04/25/2015  . Osteoporosis 07/20/2014  . Irritable bowel syndrome 07/05/2014  . Nausea 01/11/2012  . Esophageal dysphagia 01/11/2012  . LUQ pain 01/11/2012  . GERD (gastroesophageal reflux disease) 01/11/2012  . Chronic diarrhea 01/11/2012    Past Surgical History:  Procedure Laterality Date  . CATARACT EXTRACTION W/PHACO Left 01/17/2017   Procedure: CATARACT EXTRACTION PHACO AND INTRAOCULAR LENS PLACEMENT (IOC);  Surgeon: Tonny Branch, MD;  Location: [facility] ORS;  Service: Ophthalmology;  Laterality: Left;  CDE: 6.92    . CATARACT EXTRACTION W/PHACO Right 03/07/2020   Procedure: CATARACT EXTRACTION PHACO AND INTRAOCULAR LENS PLACEMENT RIGHT EYE;  Surgeon: Baruch Goldmann, MD;  Location: [facility] ORS;  Service: Ophthalmology;  Laterality: Right;  CDE: 8.44  . COLONOSCOPY  2013   RMR: Normal rectum. single diminutive sigmoid polyp noted above. Normal terminal ileum. Status post segmental biopsy. next TCS in 10 years.   . ESOPHAGOGASTRODUODENOSCOPY  2013   RMR: Possible cervical esophageal web-status post dilation as described above. Small hiatal hernia. Status post biopsy of normal appearing small bowel to screen for celiac disease.   . ESOPHAGOGASTRODUODENOSCOPY N/A 05/06/2016   Procedure: ESOPHAGOGASTRODUODENOSCOPY (EGD);  Surgeon: Daneil Dolin, MD;  Location: [facility] ENDO SUITE;  Service: Endoscopy;  Laterality: N/A;  3:00 pm - pt knows to arrive at 2:30  . MALONEY DILATION N/A 05/06/2016   Procedure: Venia Minks DILATION;  Surgeon: Daneil Dolin, MD;  Location: [facility] ENDO SUITE;  Service: Endoscopy;  Laterality: N/A;  . NECK SURGERY  10/2012  . OVARIAN CYST SURGERY  1997   removed  . TUBAL LIGATION  2000     OB History   No obstetric history on file.     Family History  Problem Relation Age of Onset  . Colon cancer Maternal Grandfather 33  . Colon polyps Mother 75  . Colon cancer Maternal Uncle 32  . Hypertension Father   . Diabetes Father   . Heart disease Father   . Hyperlipidemia Father     Social History   Tobacco Use  .  Smoking status: Current Every Day Smoker    Packs/day: 1.00    Years: 25.00    Pack years: 25.00    Types: Cigarettes  . Smokeless tobacco: Never Used  Vaping Use  . Vaping Use: Never used  Substance Use Topics  . Alcohol use: No    Alcohol/week: 0.0 standard drinks  . Drug use: No    Home Medications Prior to Admission medications   Medication Sig Start Date End Date Taking? Authorizing Provider  acetaminophen (TYLENOL) 500 MG tablet Take 500 mg by mouth every 6 (six) hours  as needed for moderate pain or headache.    [provider]  ALPRAZolam (XANAX) 0.25 MG tablet TAKE 1/2 TO 1 TABLET BY MOUTH TWICE DAILY AS NEEDED FOR ANXIETY. Patient taking differently: Take 0.625 mg by mouth daily as needed for anxiety.  02/04/20   Kathyrn Drown, MD  Ascorbic Acid (VITAMIN C PO) Take 1 tablet by mouth daily. With elderberry    [provider]  aspirin EC 81 MG tablet Take 81 mg by mouth daily. Swallow whole.    [provider]  CALCIUM PO Take 750 mg by mouth daily.    [provider]  cycloSPORINE (RESTASIS) 0.05 % ophthalmic emulsion Place 1 drop into both eyes 2 (two) times daily.     [provider]  doxycycline (VIBRA-TABS) 100 MG tablet Take 1 tablet (100 mg total) by mouth 2 (two) times daily. 06/23/20   Chalmers Guest, NP  ibuprofen (ADVIL) 600 MG tablet Take 1 tablet (600 mg total) by mouth every 6 (six) hours as needed. 07/06/20   Evalee Jefferson, PA-C  Multiple Vitamins-Minerals (MULTIVITAMIN WITH MINERALS) tablet Take 1 tablet by mouth daily.     [provider]  omeprazole (PRILOSEC) 40 MG capsule TAKE ONE CAPSULE BY MOUTH ONCE DAILY. Patient taking differently: Take 40 mg by mouth daily.  12/11/18   Annitta Needs, NP  rosuvastatin (CRESTOR) 5 MG tablet Take one tablet po each day 02/26/20   Kathyrn Drown, MD  silver sulfADIAZINE (SILVADENE) 1 % cream Apply BID prn Patient taking differently: Apply 1 application topically 2 (two) times daily as needed (burns).  11/08/19   Kathyrn Drown, MD  Triamcinolone Acetonide (NASACORT ALLERGY 24HR NA) Place 2 sprays into the nose daily.    [provider]    Allergies    Ciprofloxacin, Augmentin [amoxicillin-pot clavulanate], Carafate [sucralfate], Celexa [citalopram hydrobromide], Chantix [varenicline], Sulfa antibiotics, Wellbutrin [bupropion], and Fosamax [alendronate sodium]  Review of Systems   Review of Systems  Constitutional: Negative for chills and fever.   HENT: Negative for congestion and sore throat.   Eyes: Negative.   Respiratory: Negative for chest tightness, shortness of breath and wheezing.   Cardiovascular: Positive for chest pain.  Gastrointestinal: Negative for abdominal pain, nausea and vomiting.  Genitourinary: Negative.   Musculoskeletal: Negative for arthralgias, joint swelling and neck pain.  Skin: Negative.  Negative for rash and wound.  Neurological: Negative for dizziness, weakness, light-headedness, numbness and headaches.  Psychiatric/Behavioral: Negative.   All other systems reviewed and are negative.   Physical Exam Updated Vital Signs BP 116/65 (BP Location: Left Arm)   Pulse 83   Temp 98.3 F (36.8 C) (Oral)   Resp 14   Ht 5\' 2"  (1.575 m)   Wt 44.5 kg   SpO2 100%   BMI 17.92 kg/m   Physical Exam Vitals and nursing note reviewed.  Constitutional:      Appearance: She is  well-developed.  HENT:     Head: Normocephalic and atraumatic.  Eyes:     Conjunctiva/sclera: Conjunctivae normal.  Cardiovascular:     Rate and Rhythm: Normal rate and regular rhythm.     Heart sounds: Normal heart sounds.  Pulmonary:     Effort: Pulmonary effort is normal.     Breath sounds: Normal breath sounds. No wheezing, rhonchi or rales.     Comments: Patient is point tender to palpation along her right lower rib cage region near the midaxillary line.  There is no palpable deformity or crepitus. Chest:     Chest wall: Tenderness present. No crepitus or edema.  Abdominal:     General: Bowel sounds are normal.     Palpations: Abdomen is soft.     Tenderness: There is no abdominal tenderness. There is no guarding.     Comments: Abdomen is soft and nontender.  Musculoskeletal:        General: Normal range of motion.     Cervical back: Neck supple.  Skin:    General: Skin is warm and dry.  Neurological:     Mental Status: She is alert.     ED Results / Procedures / Treatments   Labs (all labs ordered are listed, but  only abnormal results are displayed) Labs Reviewed - No data to display  EKG None  Radiology DG Ribs Unilateral W/Chest Right  Result Date: 07/06/2020 CLINICAL DATA:  Sudden right-sided rib pain. EXAM: RIGHT RIBS AND CHEST - 3+ VIEW COMPARISON:  October 19, 2017 FINDINGS: No fracture or other bone lesions are seen involving the ribs. A radiopaque fusion plate and screws are seen overlying the lower cervical spine. There is no evidence of pneumothorax or pleural effusion. Both lungs are clear. Radiopaque surgical clips are seen overlying the medial aspect of the left apex. Heart size and mediastinal contours are within normal limits. IMPRESSION: 1. No acute osseous abnormality. Electronically Signed   By: Virgina Norfolk M.D.   On: 07/06/2020 15:52    Procedures Procedures (including critical care time)  Medications Ordered in ED Medications  ibuprofen (ADVIL) tablet 800 mg (800 mg Oral Given 07/06/20 1533)    ED Course  I have reviewed the triage vital signs and the nursing notes.  Pertinent labs & imaging results that were available during my care of the patient were reviewed by me and considered in my medical decision making (see chart for details).    MDM Rules/Calculators/A&P                          Imaging reviewed and discussed with patient.  She has no rib fracture.  Discussed home treatment including ice followed by heat.  Ibuprofen..  Follow-up with PCP if symptoms persist or worsen.  The patient appears reasonably screened and/or stabilized for discharge and I doubt any other medical condition or other [facility] requiring further screening, evaluation, or treatment in the ED at this time prior to discharge.  Final Clinical Impression(s) / ED Diagnoses Final diagnoses:  Rib contusion, right, initial encounter    Rx / DC Orders ED Discharge Orders         Ordered    ibuprofen (ADVIL) 600 MG tablet  Every 6 hours PRN        07/06/20 1614           Evalee Jefferson,  Hershal Coria 07/06/20 1806    Noemi Chapel, MD 07/07/20 [phone]

## 2020-07-10 ENCOUNTER — Other Ambulatory Visit: Payer: Self-pay | Admitting: Gastroenterology

## 2020-07-14 ENCOUNTER — Ambulatory Visit: Payer: BC Managed Care – PPO | Admitting: Family Medicine

## 2020-07-14 ENCOUNTER — Encounter: Payer: Self-pay | Admitting: Family Medicine

## 2020-07-14 ENCOUNTER — Ambulatory Visit (HOSPITAL_COMMUNITY)
Admission: RE | Admit: 2020-07-14 | Discharge: 2020-07-14 | Disposition: A | Discharge status: Home / Self Care | Payer: BC Managed Care – PPO | Source: Ambulatory Visit | Attending: Family Medicine | Admitting: Family Medicine

## 2020-07-14 ENCOUNTER — Other Ambulatory Visit: Payer: Self-pay

## 2020-07-14 VITALS — BP 136/88 | HR 92 | Temp 98.2°F | Wt 99.4 lb

## 2020-07-14 DIAGNOSIS — Z23 Encounter for immunization: Secondary | ICD-10-CM | POA: Diagnosis not present

## 2020-07-14 DIAGNOSIS — R1011 Right upper quadrant pain: Secondary | ICD-10-CM | POA: Diagnosis not present

## 2020-07-14 DIAGNOSIS — S20211A Contusion of right front wall of thorax, initial encounter: Secondary | ICD-10-CM | POA: Insufficient documentation

## 2020-07-14 DIAGNOSIS — R29898 Other symptoms and signs involving the musculoskeletal system: Secondary | ICD-10-CM

## 2020-07-14 DIAGNOSIS — R350 Frequency of micturition: Secondary | ICD-10-CM

## 2020-07-14 LAB — POCT URINALYSIS DIPSTICK (MANUAL)
Leukocytes, UA: NEGATIVE
Nitrite, UA: NEGATIVE
Poct Blood: NEGATIVE
Poct Glucose: NORMAL mg/dL
Poct Ketones: NEGATIVE
Poct Protein: NEGATIVE mg/dL
Poct Urobilinogen: 1 mg/dL — AB
Spec Grav, UA: 1.02 (ref 1.010–1.025)
pH, UA: 6 (ref 5.0–8.0)

## 2020-07-14 MED ORDER — TRAMADOL HCL 50 MG PO TABS: 50.0000 mg | ORAL_TABLET | Freq: Three times a day (TID) | ORAL | 0 refills | Status: AC | PRN

## 2020-07-14 NOTE — Progress Notes (Signed)
Patient ID: Isabel Atkinson, female    DOB: Oct 26, 1965, 54 y.o.   MRN: [id number]   Chief Complaint  Patient presents with  . right rib pain    Patient reports having an accident 1 week ago resulting in pain and bruising of right ribs and a negative x ray. Patient still having pain and discomfort.    Subjective:  CC: right rib pain  This is a new problem.  Sunday 1 week ago her husband picked her up and squeezed her from behind, they heard a loud pop and she felt pain immediately.  She went to the emergency room on November 28 x-ray done it was negative for fracture.  She is continued with a 10/10 pain today unable to move hardly at all, has tried soaking, heat, ice, Advil, and some leftover oxycodone that she had on hand.  She is concerned for fracture.  Denies fever, chills, chest pain, shortness of breath.    Medical History Shadia has a past medical history of Anxiety, GERD (gastroesophageal reflux disease), Hypercholesterolemia, Pre-diabetes, Reactive airway disease, and Tachycardia.   Outpatient Encounter Medications as of 07/14/2020  Medication Sig  . acetaminophen (TYLENOL) 500 MG tablet Take 500 mg by mouth every 6 (six) hours as needed for moderate pain or headache.  . ALPRAZolam (XANAX) 0.25 MG tablet TAKE 1/2 TO 1 TABLET BY MOUTH TWICE DAILY AS NEEDED FOR ANXIETY. (Patient taking differently: Take 0.625 mg by mouth daily as needed for anxiety. )  . Ascorbic Acid (VITAMIN C PO) Take 1 tablet by mouth daily. With elderberry  . aspirin EC 81 MG tablet Take 81 mg by mouth daily. Swallow whole.  Marland Kitchen CALCIUM PO Take 750 mg by mouth daily.  . cycloSPORINE (RESTASIS) 0.05 % ophthalmic emulsion Place 1 drop into both eyes 2 (two) times daily.   Marland Kitchen ibuprofen (ADVIL) 600 MG tablet Take 1 tablet (600 mg total) by mouth every 6 (six) hours as needed.  . Multiple Vitamins-Minerals (MULTIVITAMIN WITH MINERALS) tablet Take 1 tablet by mouth daily.   Marland Kitchen omeprazole (PRILOSEC) 40 MG capsule TAKE  ONE CAPSULE BY MOUTH ONCE DAILY. (Patient taking differently: Take 40 mg by mouth daily. )  . rosuvastatin (CRESTOR) 5 MG tablet Take one tablet po each day  . silver sulfADIAZINE (SILVADENE) 1 % cream Apply BID prn (Patient taking differently: Apply 1 application topically 2 (two) times daily as needed (burns). )  . traMADol (ULTRAM) 50 MG tablet Take 1 tablet (50 mg total) by mouth every 8 (eight) hours as needed for up to 5 days.  . Triamcinolone Acetonide (NASACORT ALLERGY 24HR NA) Place 2 sprays into the nose daily.  . [DISCONTINUED] doxycycline (VIBRA-TABS) 100 MG tablet Take 1 tablet (100 mg total) by mouth 2 (two) times daily.   No facility-administered encounter medications on file as of 07/14/2020.     Review of Systems  Constitutional: Negative for chills and fever.  Respiratory: Negative for shortness of breath.   Cardiovascular: Negative for chest pain.  Musculoskeletal: Positive for back pain.       Right anterior posterior rib pain.     Vitals BP 136/88   Pulse 92   Temp 98.2 F (36.8 C)   Wt 99 lb 6.4 oz (45.1 kg)   SpO2 99%   BMI 18.18 kg/m  Results for orders placed or performed in visit on 07/14/20  POCT Urinalysis Dip Manual  Result Value Ref Range   Spec Grav, UA 1.020 1.010 - 1.025   pH,  UA 6.0 5.0 - 8.0   Leukocytes, UA Negative Negative   Nitrite, UA Negative Negative   Poct Protein Negative Negative, trace mg/dL   Poct Glucose Normal Normal mg/dL   Poct Ketones Negative Negative   Poct Urobilinogen =1 (A) Normal mg/dL   Poct Bilirubin + (A) Negative   Poct Blood Negative Negative, trace    Objective:   Physical Exam Vitals and nursing note reviewed.  Constitutional:      General: She is not in acute distress.    Comments: Appears extremely uncomfortable, moving very slowly, pain worse with bending twisting any movement at all.  Cardiovascular:     Rate and Rhythm: Normal rate and regular rhythm.     Heart sounds: Normal heart sounds.    Pulmonary:     Effort: Pulmonary effort is normal.     Breath sounds: Normal breath sounds.  Chest:     Comments: 10/10 pain when palpating anterior and posterior ribs at 11-12 level. Abdominal:     Palpations: Abdomen is soft.     Tenderness: There is abdominal tenderness in the right upper quadrant. There is guarding.     Comments: Concerned for rib fracture, possible right upper quadrant organ damage due to significant right upper quadrant pain and tenderness.  Abdomen is soft and no bruising noted.  Neurological:     Mental Status: She is alert.      Assessment and Plan   1. Urinary frequency - POCT Urinalysis Dip Manual  2. Need for vaccination - Flu Vaccine QUAD 6+ mos PF IM (Fluarix Quad PF)  3. Suspected closed fracture of rib of right side - CT Chest Wo Contrast  4. Right upper quadrant abdominal pain - CT Chest Wo Contrast  5. Rib contusion, right, initial encounter - traMADol (ULTRAM) 50 MG tablet; Take 1 tablet (50 mg total) by mouth every 8 (eight) hours as needed for up to 5 days.  Dispense: 15 tablet; Refill: 0   Stat CT scan without contrast ot r/o rib fracture and internal organ damage.    Update: Results from CT scan: No rib fracture or other acute abnormality identified in the chest.  She already gets monitored for lung cancer, there is an unchanged 3 mm upper lobe nodule not a new finding.  Nurse to notify patient of x-ray results, tramadol sent to pharmacy on file.  Agrees with plan of care discussed today. Understands warning signs to seek further care: Chest pain, shortness of breath, any significant change in health. Understands to follow-up if symptoms worsen, do not improve.   Pecolia Ades, FNP-C

## 2020-07-28 ENCOUNTER — Other Ambulatory Visit: Payer: Self-pay

## 2020-07-28 ENCOUNTER — Ambulatory Visit (INDEPENDENT_AMBULATORY_CARE_PROVIDER_SITE_OTHER): Payer: BC Managed Care – PPO | Admitting: Family Medicine

## 2020-07-28 ENCOUNTER — Encounter: Payer: Self-pay | Admitting: Family Medicine

## 2020-07-28 VITALS — HR 93 | Temp 97.8°F | Ht 62.0 in | Wt 98.8 lb

## 2020-07-28 DIAGNOSIS — H66003 Acute suppurative otitis media without spontaneous rupture of ear drum, bilateral: Secondary | ICD-10-CM

## 2020-07-28 MED ORDER — CEFDINIR 300 MG PO CAPS: 300.0000 mg | ORAL_CAPSULE | Freq: Two times a day (BID) | ORAL | 0 refills | Status: DC

## 2020-07-28 NOTE — Progress Notes (Signed)
Patient ID: Isabel Atkinson, female    DOB: 15-Nov-1965, 54 y.o.   MRN: [id number]   Chief Complaint  Patient presents with  . Otalgia   Subjective:    HPI  Pt having ear pain. pt states left ear pain all night last night and today her right ear is hurting and left has stopped and feels like her whole head is stopped up. Tried some prescription ear drops but unsure of name.  No fever, sore throat or coughing.  No exposure to anyone with covid.   Medical History Shamika has a past medical history of Anxiety, GERD (gastroesophageal reflux disease), Hypercholesterolemia, Pre-diabetes, Reactive airway disease, and Tachycardia.   Outpatient Encounter Medications as of 07/28/2020  Medication Sig  . acetaminophen (TYLENOL) 500 MG tablet Take 500 mg by mouth every 6 (six) hours as needed for moderate pain or headache.  . ALPRAZolam (XANAX) 0.25 MG tablet TAKE 1/2 TO 1 TABLET BY MOUTH TWICE DAILY AS NEEDED FOR ANXIETY. (Patient taking differently: Take 0.625 mg by mouth daily as needed for anxiety.)  . Ascorbic Acid (VITAMIN C PO) Take 1 tablet by mouth daily. With elderberry  . aspirin EC 81 MG tablet Take 81 mg by mouth daily. Swallow whole.  Marland Kitchen CALCIUM PO Take 750 mg by mouth daily.  . cycloSPORINE (RESTASIS) 0.05 % ophthalmic emulsion Place 1 drop into both eyes 2 (two) times daily.   Marland Kitchen ibuprofen (ADVIL) 600 MG tablet Take 1 tablet (600 mg total) by mouth every 6 (six) hours as needed.  . Multiple Vitamins-Minerals (MULTIVITAMIN WITH MINERALS) tablet Take 1 tablet by mouth daily.   Marland Kitchen omeprazole (PRILOSEC) 40 MG capsule Take 1 capsule (40 mg total) by mouth daily.  . rosuvastatin (CRESTOR) 5 MG tablet Take one tablet po each day  . silver sulfADIAZINE (SILVADENE) 1 % cream Apply BID prn (Patient taking differently: Apply 1 application topically 2 (two) times daily as needed (burns).)  . Triamcinolone Acetonide (NASACORT ALLERGY 24HR NA) Place 2 sprays into the nose daily.  .  [DISCONTINUED] cefdinir (OMNICEF) 300 MG capsule Take 1 capsule (300 mg total) by mouth 2 (two) times daily.   No facility-administered encounter medications on file as of 07/28/2020.     Review of Systems  Constitutional: Negative for chills and fever.  HENT: Positive for ear pain. Negative for congestion, rhinorrhea, sinus pressure, sinus pain and sore throat.   Eyes: Negative for pain, discharge and itching.  Respiratory: Negative for cough.   Gastrointestinal: Negative for constipation, diarrhea, nausea and vomiting.     Vitals Pulse 93   Temp 97.8 F (36.6 C)   Ht 5\' 2"  (1.575 m)   Wt 98 lb 12.8 oz (44.8 kg)   SpO2 97%   BMI 18.07 kg/m   Objective:   Physical Exam Vitals and nursing note reviewed.  Constitutional:      General: She is not in acute distress.    Appearance: Normal appearance. She is not toxic-appearing.  HENT:     Head: Normocephalic and atraumatic.     Right Ear: Ear canal and external ear normal.     Left Ear: Ear canal and external ear normal.     Ears:     Comments: +erythema to bilateral TMs. No bulging.     Nose: Nose normal. No congestion or rhinorrhea.     Mouth/Throat:     Mouth: Mucous membranes are moist.     Pharynx: Oropharynx is clear. No oropharyngeal exudate or posterior oropharyngeal erythema.  Eyes:     Extraocular Movements: Extraocular movements intact.     Conjunctiva/sclera: Conjunctivae normal.     Pupils: Pupils are equal, round, and reactive to light.  Pulmonary:     Effort: Pulmonary effort is normal. No respiratory distress.  Musculoskeletal:     Cervical back: Normal range of motion.  Lymphadenopathy:     Cervical: No cervical adenopathy.  Skin:    General: Skin is warm and dry.     Findings: No rash.  Neurological:     Mental Status: She is alert and oriented to person, place, and time.  Psychiatric:        Mood and Affect: Mood normal.        Behavior: Behavior normal.      Assessment and Plan   1.  Non-recurrent acute suppurative otitis media of both ears without spontaneous rupture of tympanic membranes - Novel Coronavirus, NAA ([facility])   Pt given Augmentin. Advising to use flonase, tylenol/ibuprofen prn. covid testing pending.  Call if not improving in next 2-3 days.   F/u prn.

## 2020-07-30 ENCOUNTER — Telehealth: Payer: Self-pay | Admitting: Family Medicine

## 2020-07-30 ENCOUNTER — Ambulatory Visit: Payer: BC Managed Care – PPO | Admitting: Family Medicine

## 2020-07-30 LAB — SARS-COV-2, NAA 2 DAY TAT

## 2020-07-30 LAB — NOVEL CORONAVIRUS, NAA: SARS-CoV-2, NAA: NOT DETECTED

## 2020-07-30 NOTE — Telephone Encounter (Signed)
The cefdinir should take care of any ear infection it is more than likely that she is having noise from the eustachian tubes not draining properly she may use nasal Flonase or saline nasal spray if she feels like this is causing her pain discomfort or significant hearing issues we would be happy to relocate her ears as a courtesy tomorrow

## 2020-07-30 NOTE — Telephone Encounter (Signed)
No voicemail set up on cell phone Left message on home phone to return call

## 2020-07-30 NOTE — Telephone Encounter (Signed)
Patient was seen 12/20 with ear pain and she states today having buzzing in both ears and still hurting no better. She states its to the point she cant rest at night. Please advise  [employer]

## 2020-07-30 NOTE — Telephone Encounter (Signed)
Pt returned call and states that she began having a buzzing sounds (sounds like a bunch of crickets in the summertime) yesterday. Pt also reports not being able to hear well. Pt was seen on Monday and when provider looked in ears, she stated they were red. Please advise. Thank you

## 2020-07-30 NOTE — Telephone Encounter (Signed)
Pt contacted and verbalized understanding. Pt would like her ears to be rechecked tomorrow. Place on schedule for tomorrow.

## 2020-07-31 ENCOUNTER — Other Ambulatory Visit: Payer: Self-pay

## 2020-07-31 ENCOUNTER — Ambulatory Visit (INDEPENDENT_AMBULATORY_CARE_PROVIDER_SITE_OTHER): Payer: BC Managed Care – PPO | Admitting: Family Medicine

## 2020-07-31 ENCOUNTER — Encounter: Payer: Self-pay | Admitting: Family Medicine

## 2020-07-31 VITALS — BP 126/82 | HR 99 | Temp 98.2°F | Wt 100.8 lb

## 2020-07-31 DIAGNOSIS — H66003 Acute suppurative otitis media without spontaneous rupture of ear drum, bilateral: Secondary | ICD-10-CM

## 2020-07-31 DIAGNOSIS — H9313 Tinnitus, bilateral: Secondary | ICD-10-CM

## 2020-07-31 MED ORDER — PREDNISONE 10 MG PO TABS: ORAL_TABLET | ORAL | 0 refills | Status: DC

## 2020-07-31 MED ORDER — CEFDINIR 300 MG PO CAPS: 300.0000 mg | ORAL_CAPSULE | Freq: Two times a day (BID) | ORAL | 0 refills | Status: DC

## 2020-07-31 NOTE — Progress Notes (Signed)
   Subjective:    Patient ID: Isabel Atkinson, female    DOB: 24-Mar-1966, 54 y.o.   MRN: [id number]  HPI  Patient following up on bilateral ear pain.   Patient is still taking Omnicef and states pain has slightly improved but since Tuesday has had a buzzing in her ear and feels she can hear her heartbeat. Trouble hearing.   This sounds more like tinnitus related issues.  No vertigo.  Pain is subsiding.  Still on antibiotic. Review of Systems     Objective:   Physical Exam Ears bilateral look normal throat normal neck no masses lungs clear       Assessment & Plan:  Sinusitis 10 additional days of antibiotics Tinnitus related to eustachian tube dysfunction If not improving dramatically over the next 7 to 10 days referral to Dr.Teoh patient aware

## 2020-08-04 ENCOUNTER — Telehealth: Payer: Self-pay

## 2020-08-04 DIAGNOSIS — H9313 Tinnitus, bilateral: Secondary | ICD-10-CM

## 2020-08-04 NOTE — Telephone Encounter (Signed)
Pt said ears are no better and to go ahead and referrer her to Dr Christain [place]   Pt call back [phone]

## 2020-08-04 NOTE — Telephone Encounter (Signed)
Referral placed(per last office note) and pt is aware

## 2020-08-12 ENCOUNTER — Telehealth: Payer: Self-pay

## 2020-08-12 ENCOUNTER — Other Ambulatory Visit: Payer: Self-pay | Admitting: *Deleted

## 2020-08-12 MED ORDER — PREDNISONE 20 MG PO TABS
ORAL_TABLET | ORAL | 0 refills | Status: DC
Start: 2020-08-12 — End: 2020-10-16

## 2020-08-12 MED ORDER — DOXYCYCLINE HYCLATE 100 MG PO TABS
100.0000 mg | ORAL_TABLET | Freq: Two times a day (BID) | ORAL | 0 refills | Status: DC
Start: 2020-08-12 — End: 2020-10-16

## 2020-08-12 NOTE — Telephone Encounter (Signed)
Doxycycline 100 mg 1 twice daily 7 days, prednisone 20 mg 2 daily for 5 days

## 2020-08-12 NOTE — Telephone Encounter (Signed)
Patient can't get in to the ENT until the end of January.  She thinks she needs another round of predisone and another antibiotic for her ear until she can be seen by the ent doc.  Ear is better but still not well.   [employer]

## 2020-08-12 NOTE — Telephone Encounter (Signed)
Scripts sent to [facility], patient notified.

## 2020-08-21 ENCOUNTER — Other Ambulatory Visit: Payer: Self-pay

## 2020-08-21 ENCOUNTER — Encounter: Payer: Self-pay | Admitting: Family Medicine

## 2020-08-21 ENCOUNTER — Ambulatory Visit (INDEPENDENT_AMBULATORY_CARE_PROVIDER_SITE_OTHER): Payer: BC Managed Care – PPO | Admitting: Family Medicine

## 2020-08-21 VITALS — HR 88 | Temp 99.0°F | Resp 16

## 2020-08-21 DIAGNOSIS — R059 Cough, unspecified: Secondary | ICD-10-CM

## 2020-08-21 DIAGNOSIS — U071 COVID-19: Secondary | ICD-10-CM

## 2020-08-21 DIAGNOSIS — R110 Nausea: Secondary | ICD-10-CM | POA: Diagnosis not present

## 2020-08-21 MED ORDER — ONDANSETRON 8 MG PO TBDP: 8.0000 mg | ORAL_TABLET | Freq: Three times a day (TID) | ORAL | 0 refills | Status: DC | PRN

## 2020-08-21 MED ORDER — BENZONATATE 100 MG PO CAPS: 100.0000 mg | ORAL_CAPSULE | Freq: Two times a day (BID) | ORAL | 0 refills | Status: DC | PRN

## 2020-08-21 MED ORDER — ALBUTEROL SULFATE HFA 108 (90 BASE) MCG/ACT IN AERS: 2.0000 | INHALATION_SPRAY | Freq: Four times a day (QID) | RESPIRATORY_TRACT | 0 refills | Status: DC | PRN

## 2020-08-21 NOTE — Patient Instructions (Signed)
Covid-19 warning:  Covid-19 is a virus that causes hypoxia (low oxygen level in blood) in some people. If you develop any changes in your usual breathing pattern: difficulty catching your breath, more short winded with activity or with resting, or anything that concerns you about your breathing, do not hesitate to go to the emergency department immediately for evaluation. Covid infection can also affect the way the brain functions if it lacks oxygen, such as, feeling dizzy, passing out, or feeling confused, if you experience any of these symptoms, please do not delay to seek treatment.  Some people experience gastrointestinal problems with Covid, such as vomiting and diarrhea, dehydration is a serious risk and should be avoided. If you are unable to keep liquids down you may need to go to the emergency department for intravenous fluids to avoid dehydration.   Please alert and involve your family and/or friends to help keep an eye on you while you recover from Covid-19. If you have any questions or concerns about your recovery, please do not hesitate to call the office for guidance.   Recommend supportive therapy while you are recovering:   1) Get lots of rest.  2) Take over the counter pain medication if needed, such as acetaminophen or ibuprofen. Read and follow instructions on the label and make sure not to combine other medications that may have same ingredients in it. It is important to not take too much of these ingredients.  3) Drink plenty of caffeine-free fluids. (If you have heart or kidney problems, follow the instructions of your specialist regarding amounts).  4) If you are hungry, eat a bland diet, such as the BRAT diet (bananas, rice, applesauce, toast).  5) Let us know if you are not feeling better in a week.   Covid-19 Quarantine Instructions:   You have tested positive for Covid-19 infection. The current CDC guidelines for quarantine regardless of vaccination status are:     Please quarantine and isolate at home for 5 days.  - If you have no symptoms or your symptoms are resolving after 5 days you   can leave the home (resolving means no shortness of breath, no fever, no headache, etc). -Continue to wear a mask around others for an additional 5 days.  -If you have a fever or other symptoms continue to stay at home until   resolved.  Use over-the-counter medications for symptoms.If you develop respiratory issues/distress (see Covid warning), seek medical care in the Emergency Department.  If you must leave home or if you have to be around others please wear a mask. Please limit contact with immediate family members in the home, practice social distancing, frequent handwashing and clean hard surfaces touched frequently with household cleaning products. Members of your household will also need to quarantine for 5 days and test on day five if possible.You may also be contacted by the health department for follow up.

## 2020-08-21 NOTE — Progress Notes (Signed)
Patient ID: Isabel Atkinson, female    DOB: 08-31-1965, 55 y.o.   MRN: [id number]   Chief Complaint  Patient presents with  . Headache    Cough, congestion and weak since Monday am- home test positive for Covid and multiple family members in houshold have Covid   Subjective:  CC: cough, congestion, headache  This is a new problem.  Presents today for an acute visit with a complaint of positive home COVID test, cough, congestion, bad headache, symptoms started on Monday night.  Associated symptoms include ear pain, not hurting at this moment.  Denies fever, chills, chest pain, shortness of breath, abdominal pain, nausea, vomiting.  Has been on doxycycline for ear infection, has finished that course, has been taking Tylenol for her symptoms.  She has been vaccinated with booster, her employer requires a PCR COVID test, will perform this today.  She has an ENT appointment on September 03, 2020 for her ear problems.    Medical History Arrianna has a past medical history of Anxiety, GERD (gastroesophageal reflux disease), Hypercholesterolemia, Pre-diabetes, Reactive airway disease, and Tachycardia.   Outpatient Encounter Medications as of 08/21/2020  Medication Sig  . albuterol (VENTOLIN HFA) 108 (90 Base) MCG/ACT inhaler Inhale 2 puffs into the lungs every 6 (six) hours as needed for wheezing or shortness of breath.  . benzonatate (TESSALON) 100 MG capsule Take 1 capsule (100 mg total) by mouth 2 (two) times daily as needed for cough.  . ondansetron (ZOFRAN ODT) 8 MG disintegrating tablet Take 1 tablet (8 mg total) by mouth every 8 (eight) hours as needed for nausea or vomiting.  Marland Kitchen acetaminophen (TYLENOL) 500 MG tablet Take 500 mg by mouth every 6 (six) hours as needed for moderate pain or headache.  . ALPRAZolam (XANAX) 0.25 MG tablet TAKE 1/2 TO 1 TABLET BY MOUTH TWICE DAILY AS NEEDED FOR ANXIETY. (Patient taking differently: Take 0.625 mg by mouth daily as needed for anxiety.)  . Ascorbic Acid  (VITAMIN C PO) Take 1 tablet by mouth daily. With elderberry  . aspirin EC 81 MG tablet Take 81 mg by mouth daily. Swallow whole.  Marland Kitchen CALCIUM PO Take 750 mg by mouth daily.  . cefdinir (OMNICEF) 300 MG capsule Take 1 capsule (300 mg total) by mouth 2 (two) times daily.  . cycloSPORINE (RESTASIS) 0.05 % ophthalmic emulsion Place 1 drop into both eyes 2 (two) times daily.   Marland Kitchen doxycycline (VIBRA-TABS) 100 MG tablet Take 1 tablet (100 mg total) by mouth 2 (two) times daily.  Marland Kitchen ibuprofen (ADVIL) 600 MG tablet Take 1 tablet (600 mg total) by mouth every 6 (six) hours as needed.  . Multiple Vitamins-Minerals (MULTIVITAMIN WITH MINERALS) tablet Take 1 tablet by mouth daily.   Marland Kitchen omeprazole (PRILOSEC) 40 MG capsule Take 1 capsule (40 mg total) by mouth daily.  . predniSONE (DELTASONE) 20 MG tablet Take 1 tablet twice per day for 5 days  . rosuvastatin (CRESTOR) 5 MG tablet Take one tablet po each day  . silver sulfADIAZINE (SILVADENE) 1 % cream Apply BID prn (Patient taking differently: Apply 1 application topically 2 (two) times daily as needed (burns).)  . Triamcinolone Acetonide (NASACORT ALLERGY 24HR NA) Place 2 sprays into the nose daily.   No facility-administered encounter medications on file as of 08/21/2020.     Review of Systems  Constitutional: Negative for chills and fever.  HENT: Positive for congestion and ear pain.   Respiratory: Positive for cough. Negative for shortness of breath.   Gastrointestinal:  Negative for abdominal pain, nausea and vomiting.  Neurological: Positive for headaches.     Vitals Pulse 88   Temp 99 F (37.2 C)   Resp 16   SpO2 98%   Objective:   Physical Exam Vitals reviewed.  Constitutional:      General: She is not in acute distress.    Appearance: Normal appearance. She is ill-appearing.  HENT:     Right Ear: Tympanic membrane normal.     Left Ear: Tympanic membrane normal.     Nose:     Right Turbinates: Swollen.     Left Turbinates: Swollen.      Right Sinus: Maxillary sinus tenderness present.     Left Sinus: Maxillary sinus tenderness present.     Mouth/Throat:     Pharynx: Posterior oropharyngeal erythema present.  Cardiovascular:     Rate and Rhythm: Normal rate and regular rhythm.     Heart sounds: Normal heart sounds.  Pulmonary:     Effort: Pulmonary effort is normal.     Breath sounds: Normal breath sounds.  Skin:    General: Skin is warm and dry.  Neurological:     General: No focal deficit present.     Mental Status: She is alert.  Psychiatric:        Behavior: Behavior normal.      Assessment and Plan   1. COVID-19 virus infection - benzonatate (TESSALON) 100 MG capsule; Take 1 capsule (100 mg total) by mouth 2 (two) times daily as needed for cough.  Dispense: 20 capsule; Refill: 0 - albuterol (VENTOLIN HFA) 108 (90 Base) MCG/ACT inhaler; Inhale 2 puffs into the lungs every 6 (six) hours as needed for wheezing or shortness of breath.  Dispense: 8 g; Refill: 0 - ondansetron (ZOFRAN ODT) 8 MG disintegrating tablet; Take 1 tablet (8 mg total) by mouth every 8 (eight) hours as needed for nausea or vomiting.  Dispense: 20 tablet; Refill: 0 - Novel Coronavirus, NAA ([facility])  2. Cough - benzonatate (TESSALON) 100 MG capsule; Take 1 capsule (100 mg total) by mouth 2 (two) times daily as needed for cough.  Dispense: 20 capsule; Refill: 0 - albuterol (VENTOLIN HFA) 108 (90 Base) MCG/ACT inhaler; Inhale 2 puffs into the lungs every 6 (six) hours as needed for wheezing or shortness of breath.  Dispense: 8 g; Refill: 0 - Novel Coronavirus, NAA ([facility])  3. Nausea - ondansetron (ZOFRAN ODT) 8 MG disintegrating tablet; Take 1 tablet (8 mg total) by mouth every 8 (eight) hours as needed for nausea or vomiting.  Dispense: 20 tablet; Refill: 0   Positive home, COVID test, will likely have positive PCR test as well.  Symptoms are mild currently, with upcoming weekend, and possible snowstorm will send medications to treat  possible symptoms.  Recommend supportive therapy, adequate hydration, and OTC medications such as Tylenol/ibuprofen.  Denies fever currently, instructed her symptoms may worsen in time.  Agrees with plan of care discussed today. Understands warning signs to seek further care: Chest pain, shortness of breath, any significant change in health.  COVID warning as stated below given. Understands to follow-up if symptoms worsen, she reports that she has a pulse ox meter, she is instructed to measure her oxygen saturations regularly,and to report to emergency room if readings are 93% or less. She understands.   Covid-19 warning:  Covid-19 is a virus that causes hypoxia (low oxygen level in blood) in some people. If you develop any changes in your usual breathing pattern: difficulty catching your  breath, more short winded with activity or with resting, or anything that concerns you about your breathing, do not hesitate to go to the emergency department immediately for evaluation. Covid infection can also affect the way the brain functions if it lacks oxygen, such as, feeling dizzy, passing out, or feeling confused, if you experience any of these symptoms, please do not delay to seek treatment.  Some people experience gastrointestinal problems with Covid, such as vomiting and diarrhea, dehydration is a serious risk and should be avoided. If you are unable to keep liquids down you may need to go to the emergency department for intravenous fluids to avoid dehydration.   Please alert and involve your family and/or friends to help keep an eye on you while you recover from Covid-19. If you have any questions or concerns about your recovery, please do not hesitate to call the office for guidance.     Covid-19 Quarantine Instructions:   You have tested positive for Covid-19 infection. The current CDC guidelines for quarantine regardless of vaccination status are:    Please quarantine and isolate at home for 5  days.  - If you have no symptoms or your symptoms are resolving after 5 days you   can leave the home (resolving means no shortness of breath, no fever, no headache, etc). -Continue to wear a mask around others for an additional 5 days.  -If you have a fever or other symptoms continue to stay at home until   resolved.  Use over-the-counter medications for symptoms.If you develop respiratory issues/distress (see Covid warning), seek medical care in the Emergency Department.  If you must leave home or if you have to be around others please wear a mask. Please limit contact with immediate family members in the home, practice social distancing, frequent handwashing and clean hard surfaces touched frequently with household cleaning products. Members of your household will also need to quarantine for 5 days and test on day five if possible.You may also be contacted by the health department for follow up.    Recommend supportive therapy while you are recovering:   1) Get lots of rest.  2) Take over the counter pain medication if needed, such as acetaminophen or ibuprofen. Read and follow instructions on the label and make sure not to combine other medications that may have same ingredients in it. It is important to not take too much of these ingredients.  3) Drink plenty of caffeine-free fluids. (If you have heart or kidney problems, follow the instructions of your specialist regarding amounts).  4) If you are hungry, eat a bland diet, such as the BRAT diet (bananas, rice, applesauce, toast).  5) Let us know if you are not feeling better in a week.

## 2020-08-23 LAB — SPECIMEN STATUS REPORT

## 2020-08-23 LAB — NOVEL CORONAVIRUS, NAA: SARS-CoV-2, NAA: DETECTED — AB

## 2020-08-23 LAB — SARS-COV-2, NAA 2 DAY TAT

## 2020-08-24 ENCOUNTER — Telehealth: Payer: Self-pay | Admitting: Adult Health

## 2020-08-24 NOTE — Telephone Encounter (Signed)
Called to discuss with patient about COVID-19 symptoms and the use of one of the available treatments for those with mild to moderate Covid symptoms and at a high risk of hospitalization.  Pt appears to qualify for outpatient treatment due to co-morbid conditions and/or a member of an at-risk group in accordance with the FDA Emergency Use Authorization.    Symptom onset: 08/18/20 Vaccinated: yes  Booster? ?  Immunocompromised? No  Qualifiers: Yes -COPD   Unable to reach pt - left message to call back .   Tiffanye Hartmann NP-C

## 2020-08-25 ENCOUNTER — Other Ambulatory Visit (HOSPITAL_COMMUNITY): Payer: BC Managed Care – PPO

## 2020-08-25 ENCOUNTER — Ambulatory Visit (HOSPITAL_COMMUNITY): Payer: BC Managed Care – PPO

## 2020-09-05 ENCOUNTER — Other Ambulatory Visit (HOSPITAL_COMMUNITY): Payer: Self-pay | Admitting: Otolaryngology

## 2020-09-05 DIAGNOSIS — J329 Chronic sinusitis, unspecified: Secondary | ICD-10-CM

## 2020-09-09 ENCOUNTER — Other Ambulatory Visit: Payer: Self-pay

## 2020-09-09 ENCOUNTER — Ambulatory Visit (HOSPITAL_COMMUNITY)
Admission: RE | Admit: 2020-09-09 | Discharge: 2020-09-09 | Disposition: A | Discharge status: Home / Self Care | Payer: BC Managed Care – PPO | Source: Ambulatory Visit | Attending: Otolaryngology | Admitting: Otolaryngology

## 2020-09-09 DIAGNOSIS — J329 Chronic sinusitis, unspecified: Secondary | ICD-10-CM | POA: Diagnosis present

## 2020-09-15 ENCOUNTER — Telehealth: Payer: Self-pay

## 2020-09-15 MED ORDER — NYSTATIN 100000 UNIT/ML MT SUSP: OROMUCOSAL | 0 refills | Status: DC

## 2020-09-15 MED ORDER — FLUCONAZOLE 200 MG PO TABS
ORAL_TABLET | ORAL | 0 refills | Status: DC
Start: 2020-09-15 — End: 2020-10-16

## 2020-09-15 NOTE — Telephone Encounter (Signed)
Medication sent to pharmacy and pt is aware 

## 2020-09-15 NOTE — Telephone Encounter (Signed)
Pt has been on antibiotics 4 type and three rounds of prednisone and now she thinks she has thrush in her mouth and wants to see if something can be called in for that. [facility] is pharmacy used   Call back [phone]

## 2020-09-15 NOTE — Telephone Encounter (Signed)
Please advise. Thank you

## 2020-09-15 NOTE — Telephone Encounter (Signed)
Oral nystatin 1 teaspoon swish and swallow 4 times daily for 7 days  Also Diflucan 200 mg 1 daily for the next 5 days

## 2020-09-28 ENCOUNTER — Ambulatory Visit: Payer: Self-pay

## 2020-10-03 ENCOUNTER — Ambulatory Visit (HOSPITAL_COMMUNITY): Payer: Self-pay

## 2020-10-03 ENCOUNTER — Other Ambulatory Visit (HOSPITAL_COMMUNITY): Payer: Self-pay

## 2020-10-08 ENCOUNTER — Ambulatory Visit (HOSPITAL_COMMUNITY)
Admission: RE | Admit: 2020-10-08 | Discharge: 2020-10-08 | Disposition: A | Discharge status: Home / Self Care | Payer: BC Managed Care – PPO | Source: Ambulatory Visit | Attending: Family Medicine | Admitting: Family Medicine

## 2020-10-08 ENCOUNTER — Other Ambulatory Visit: Payer: Self-pay

## 2020-10-08 DIAGNOSIS — M810 Age-related osteoporosis without current pathological fracture: Secondary | ICD-10-CM | POA: Insufficient documentation

## 2020-10-08 DIAGNOSIS — Z1231 Encounter for screening mammogram for malignant neoplasm of breast: Secondary | ICD-10-CM | POA: Diagnosis present

## 2020-10-09 ENCOUNTER — Other Ambulatory Visit: Payer: Self-pay | Admitting: *Deleted

## 2020-10-09 DIAGNOSIS — M810 Age-related osteoporosis without current pathological fracture: Secondary | ICD-10-CM

## 2020-10-16 ENCOUNTER — Other Ambulatory Visit: Payer: Self-pay

## 2020-10-16 ENCOUNTER — Encounter: Payer: Self-pay | Admitting: "Endocrinology

## 2020-10-16 ENCOUNTER — Ambulatory Visit: Payer: BC Managed Care – PPO | Admitting: "Endocrinology

## 2020-10-16 VITALS — BP 124/72 | HR 84 | Ht 62.0 in | Wt 103.6 lb

## 2020-10-16 DIAGNOSIS — F17200 Nicotine dependence, unspecified, uncomplicated: Secondary | ICD-10-CM | POA: Diagnosis not present

## 2020-10-16 DIAGNOSIS — M818 Other osteoporosis without current pathological fracture: Secondary | ICD-10-CM | POA: Diagnosis not present

## 2020-10-16 NOTE — Progress Notes (Signed)
10/16/2020      Endocrinology Consult Note  Past Medical History:  Diagnosis Date   Anxiety    GERD (gastroesophageal reflux disease)    Hypercholesterolemia    Pre-diabetes    Reactive airway disease    Tachycardia    Past Surgical History:  Procedure Laterality Date   CATARACT EXTRACTION W/PHACO Left 01/17/2017   Procedure: CATARACT EXTRACTION PHACO AND INTRAOCULAR LENS PLACEMENT ([place]);  Surgeon: Tonny Branch, MD;  Location: [facility];  Service: Ophthalmology;  Laterality: Left;  CDE: 6.92   CATARACT EXTRACTION W/PHACO Right 03/07/2020   Procedure: CATARACT EXTRACTION PHACO AND INTRAOCULAR LENS PLACEMENT RIGHT EYE;  Surgeon: Baruch Goldmann, MD;  Location: [facility];  Service: Ophthalmology;  Laterality: Right;  CDE: 8.44   COLONOSCOPY  2013   RMR: Normal rectum. single diminutive sigmoid polyp noted above. Normal terminal ileum. Status post segmental biopsy. next TCS in 10 years.    ESOPHAGOGASTRODUODENOSCOPY  2013   RMR: Possible cervical esophageal web-status post dilation as described above. Small hiatal hernia. Status post biopsy of normal appearing small bowel to screen for celiac disease.    ESOPHAGOGASTRODUODENOSCOPY N/A 05/06/2016   Procedure: ESOPHAGOGASTRODUODENOSCOPY (EGD);  Surgeon: Daneil Dolin, MD;  Location: [facility];  Service: Endoscopy;  Laterality: N/A;  3:00 pm - pt knows to arrive at 2:30   [place] N/A 05/06/2016   Procedure: Venia Minks DILATION;  Surgeon: Daneil Dolin, MD;  Location: [facility];  Service: Endoscopy;  Laterality: N/A;   NECK SURGERY  10/2012   OVARIAN CYST SURGERY  1997   removed   TUBAL LIGATION  2000   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    Employer:  [employer]  Tobacco Use   Smoking status: Current Every Day Smoker    Packs/day: 1.00    Years: 25.00    Pack years: 25.00    Types: Cigarettes   Smokeless tobacco: Never Used  Scientific laboratory technician Use: Never used  Substance and Sexual Activity   Alcohol use: No    Alcohol/week: 0.0 standard drinks   Drug use: No   Sexual activity: Yes    Birth control/protection: Post-menopausal, Surgical  Other Topics Concern   Not on file  Social History Narrative   Lives at home w/ her husband   2 children - ages 31 & 19   Right-sided   Caffeine: 2 beverages per day   Social Determinants of Health   Financial Resource Strain: Not on file  Food Insecurity: Not on file  Transportation Needs: Not on file  Physical Activity: Not on file  Stress: Not on file  Social Connections: Not on file   Outpatient Encounter Medications as of 10/16/2020  Medication Sig   cholecalciferol (VITAMIN D3) 25 MCG (1000 UNIT) tablet Take 2,000 Units by mouth daily.   acetaminophen (TYLENOL) 500 MG tablet Take 500 mg by mouth every 6 (six) hours as  needed for moderate pain or headache.   albuterol (VENTOLIN HFA) 108 (90 Base) MCG/ACT inhaler Inhale 2 puffs into the lungs every 6 (six) hours as needed for wheezing or shortness of breath.   ALPRAZolam (XANAX) 0.25 MG tablet TAKE 1/2 TO 1 TABLET BY MOUTH TWICE DAILY AS NEEDED FOR ANXIETY. (Patient taking differently: Take 0.625 mg by mouth daily as needed for anxiety.)   Ascorbic Acid (VITAMIN C PO) Take 1 tablet by mouth daily. With elderberry   aspirin EC 81 MG tablet Take 81 mg by mouth daily. Swallow whole.   CALCIUM PO Take 750 mg by mouth 2 (two) times daily with a meal.   cycloSPORINE (RESTASIS) 0.05 % ophthalmic emulsion Place 1 drop into both eyes 2 (two) times daily.    ibuprofen (ADVIL) 600 MG tablet Take 1 tablet (600 mg total) by mouth every 6 (six) hours as needed.   Multiple Vitamins-Minerals (MULTIVITAMIN WITH MINERALS)  tablet Take 1 tablet by mouth daily.    nystatin (MYCOSTATIN) 100000 UNIT/ML suspension Swish and swallow one teaspoon QID for 7 days   omeprazole (PRILOSEC) 40 MG capsule Take 1 capsule (40 mg total) by mouth daily.   ondansetron (ZOFRAN ODT) 8 MG disintegrating tablet Take 1 tablet (8 mg total) by mouth every 8 (eight) hours as needed for nausea or vomiting.   rosuvastatin (CRESTOR) 5 MG tablet Take one tablet po each day   silver sulfADIAZINE (SILVADENE) 1 % cream Apply BID prn (Patient taking differently: Apply 1 application topically 2 (two) times daily as needed (burns).)   Triamcinolone Acetonide (NASACORT ALLERGY 24HR NA) Place 2 sprays into the nose daily.   [DISCONTINUED] benzonatate (TESSALON) 100 MG capsule Take 1 capsule (100 mg total) by mouth 2 (two) times daily as needed for cough.   [DISCONTINUED] cefdinir (OMNICEF) 300 MG capsule Take 1 capsule (300 mg total) by mouth 2 (two) times daily.   [DISCONTINUED] doxycycline (VIBRA-TABS) 100 MG tablet Take 1 tablet (100 mg total) by mouth 2 (two) times daily.   [DISCONTINUED] fluconazole (DIFLUCAN) 200 MG tablet Take one tablet po daily for next 5 days   [DISCONTINUED] predniSONE (DELTASONE) 20 MG tablet Take 1 tablet twice per day for 5 days   No facility-administered encounter medications on file as of 10/16/2020.   ALLERGIES: Allergies  Allergen Reactions   Ciprofloxacin Other (See Comments)    Patient states with in five minutes of taking developed a splitting headache,itching all over and burning in mouth and lips.   Augmentin [Amoxicillin-Pot Clavulanate] Diarrhea   Carafate [Sucralfate]     Mouth tingling   Celexa [Citalopram Hydrobromide] Nausea And Vomiting   Chantix [Varenicline] Nausea And Vomiting   Sulfa Antibiotics Itching   Wellbutrin [Bupropion]     Severe weight loss and loss of appetite   Fosamax [Alendronate Sodium] Nausea And Vomiting    GERD can not tolerate    VACCINATION  STATUS: Immunization History  Administered Date(s) Administered   Influenza,inj,Quad PF,6+ Mos 09/14/2017, 07/04/2018, 04/25/2019, 07/14/2020   PFIZER(Purple Top)SARS-COV-2 Vaccination 10/14/2019, 11/04/2019   Pneumococcal Polysaccharide-23 04/25/2019   Tdap 01/19/2018    HPI   Isabel Atkinson is 55 y.o. female who presents today with a medical history as above. she is being seen in consultation for osteoporosis requested by Kathyrn Drown, MD.  Patient was diagnosed with osteoporosis  approximately  8 years ago. She denies fractures or falls. No dizziness/vertigo/orthostasis. She was treated with Fosamax which she did not tolerate.  She was also treated with  what appears to be Reclast infusion also did not tolerate.  For the last 5 years, she was not on any treatment. She has history of vitamin D deficiency currently on vitamin D and calcium supplements.  She also eats dairy and green, leafy, vegetables.   No weight bearing exercises.  She denies loss of height. She has prior exposure to on and off steroids due to COPD/reactive airway disease, however denies chronic exposure to heavy dose steroids. She denies any prior history of parathyroid, thyroid dysfunctions.  No history of nephrolithiasis.  Her most recent calcium level is 9.6.  No h/o CKD. Last BUN/Cr: 15/0.56  Menopause was at 55 y/o.   Pt does not have a FH of osteoporosis.  She has dental implants, however no scheduled dental procedure in the next near future.   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  She also has history of irritable bowel syndrome, prediabetes.  Review of Systems  Constitutional: + No recent major weight change, she has always been light build,  + fatigue, no subjective hyperthermia, no subjective hypothermia Eyes: no blurry vision, no xerophthalmia ENT: no sore throat, no nodules palpated in throat, no dysphagia/odynophagia, no  hoarseness Cardiovascular: no Chest Pain, no Shortness of Breath, no palpitations, no leg swelling Respiratory: + cough, no SOB Gastrointestinal: no Nausea/Vomiting/Diarhhea Musculoskeletal: no muscle/joint aches Skin: no rashes Neurological: no tremors, no numbness, no tingling, no dizziness Psychiatric: no depression, no anxiety  Objective:    BP 124/72    Pulse 84    Ht 5\' 2"  (1.575 m)    Wt 103 lb 9.6 oz (47 kg)    BMI 18.95 kg/m   Wt Readings from Last 3 Encounters:  10/16/20 103 lb 9.6 oz (47 kg)  07/31/20 100 lb 12.8 oz (45.7 kg)  07/28/20 98 lb 12.8 oz (44.8 kg)    Physical Exam  Constitutional: + BMI of 18.9, not in acute distress, normal state of mind Eyes: PERRLA, EOMI, no exophthalmos ENT: moist mucous membranes, no thyromegaly, no cervical lymphadenopathy Cardiovascular: normal precordial activity, Regular Rate and Rhythm, no Murmur/Rubs/Gallops Respiratory:  adequate breathing efforts, no gross chest deformity, Clear to auscultation bilaterally Gastrointestinal: abdomen soft, Non -tender, No distension, Bowel Sounds present Musculoskeletal: no gross deformities, strength intact in all four extremities Skin: moist, warm, no rashes, + scar on neck from prior cervical vertebral surgery. Neurological: no tremor with outstretched hands, Deep tendon reflexes normal in all four extremities.  CMP ( most recent) CMP     Component Value Date/Time   NA 143 01/11/2020 0811   K 4.4 01/11/2020 0811   CL 107 (H) 01/11/2020 0811   CO2 24 01/11/2020 0811   GLUCOSE 89 01/11/2020 0811   GLUCOSE 111 (H) 01/11/2017 1440   BUN 15 01/11/2020 0811   CREATININE 0.56 (L) 01/11/2020 0811   CREATININE 0.55 11/03/2013 0951   CALCIUM 9.6 01/11/2020 0811   PROT 7.2 06/20/2020 0813   PROT 7.0 12/09/2011 0900   ALBUMIN 5.0 (H) 06/20/2020 0813   AST 15 06/20/2020 0813   AST 18 12/09/2011 0900   ALT 11 06/20/2020 0813   ALKPHOS 89 06/20/2020 0813   ALKPHOS 76 12/09/2011 0900   BILITOT  0.3 06/20/2020 0813   BILITOT 0.3 12/09/2011 0900   GFRNONAA 105 01/11/2020 0811   GFRAA 121 01/11/2020 0811    Lipid Panel     Component Value Date/Time   CHOL 182 06/20/2020 0813  TRIG 50 06/20/2020 0813   HDL 78 06/20/2020 0813   CHOLHDL 2.3 06/20/2020 0813   CHOLHDL 2.5 11/03/2013 0951   VLDL 13 11/03/2013 0951   LDLCALC 94 06/20/2020 0813   LABVLDL 10 06/20/2020 0813      Lab Results  Component Value Date   TSH 1.430 01/11/2020   TSH 1.110 12/01/2017   TSH 1.030 01/14/2016   TSH 1.030 09/10/2015   TSH 0.962 04/25/2015   TSH 0.924 11/03/2013   FREET4 1.45 01/14/2016     Bone density on October 08, 2020 [facility] L1-L4 10/08/2020 56.4 Osteoporosis -2.9 0.834 g/cm2 -9.9% [facility] L1-L4 06/26/2015 51.1 Osteopenia -2.1 0.926 g/cm2 4.2% - [facility] L1-L4 05/18/2013 49.0 Osteopenia -2.4 0.889 g/cm2 - -  DualFemur Neck Right 10/08/2020 56.4 Osteoporosis -2.7 0.659 g/cm2 -7.7%  DualFemur Neck Right 06/26/2015 51.1 Osteopenia -2.3 0.714 g/cm2 4.7% - DualFemur Neck Right 05/18/2013 49.0 Osteoporosis -2.6 0.682 g/cm2 -  DualFemur Total Mean 10/08/2020 56.4 Osteopenia -2.2 0.726 g/cm2 -9.5%  DualFemur Total Mean 06/26/2015 51.1 Osteopenia -1.6 0.802 g/cm2 3.9%  DualFemur Total Mean 05/18/2013 49.0 Osteopenia -1.9 0.772 g/cm2 -    ASSESSMENT: BMD as determined from [facility] L1-L4 is 0.834 g/cm2 with a T-Score of -2.9. This patient is considered osteoporotic according to [facility]) criteria.    Assessment: 1. Osteoporosis 2.  Bisphosphonates intolerance, GERD  Plan: 1. Osteoporosis - likely multifactorial including postmenopausal, steroids, smoking -I discussed her available bone density studies, 3 studies between 2014 and 2022 with her, discussed about increased risk of fracture, depending on the T score. - We discussed about the different medication classes, benefits and side effects (including atypical fractures and ONJ - no dental workup in  progress or planned).  - I explained that, since she has severe GERD, she is not a candidate for oral bisphosphonates, she did not tolerate IV bisphosphonates either.   -Her next suitable option seems to be denosumab subcutaneously. - I explained the mechanism of action and expected benefits.  -She will need repeat renal function, thyroid function, PTH, and will return in 2 weeks for her first Prolia injection if she remains a candidate. - we reviewed her dietary and supplemental calcium and vitamin D intake, which I advised her to increase her vitamin D to 2000 units daily, calcium 750 mg twice a day.  We also discussed food sources of this elements and vitamin.  - discussed fall precautions   -She is chronic active smoker: The patient was counseled on the dangers of tobacco use, and was advised to quit.  Reviewed strategies to maximize success, including removing cigarettes and smoking materials from environment.  -Her next bone density is not due until March 2024.    - I advised patient to maintain close follow up with Kathyrn Drown, MD for primary care needs.  - Time spent with the patient: 60 minutes, of which >50% was spent in obtaining information about her symptoms, reviewing her previous labs, evaluations, and treatments, counseling her about her osteoporosis, and developing a plan to confirm the diagnosis and long term treatment as necessary.  Karena Addison participated in the discussions, expressed understanding, and voiced agreement with the above plans.  All questions were answered to her satisfaction. she is encouraged to contact clinic should she have any questions or concerns prior to her return visit.  Follow up plan: Return in about 2 weeks (around 10/30/2020) for F/U with Pre-visit Labs, Prolia During NV.   Glade Lloyd, MD [facility] [address] [place], [place] [address] Phone: [phone]  Fax: [phone]      10/16/2020, 5:39 PM  This note was partially dictated with voice recognition software. Similar sounding words can be transcribed inadequately or may not  be corrected upon review.

## 2020-10-24 LAB — COMPREHENSIVE METABOLIC PANEL
ALT: 11 IU/L (ref 0–32)
AST: 18 IU/L (ref 0–40)
Albumin/Globulin Ratio: 2 (ref 1.2–2.2)
Albumin: 4.2 g/dL (ref 3.8–4.9)
Alkaline Phosphatase: 85 IU/L (ref 44–121)
BUN/Creatinine Ratio: 30 — ABNORMAL HIGH (ref 9–23)
BUN: 17 mg/dL (ref 6–24)
Bilirubin Total: <0.2 mg/dL (ref 0.0–1.2)
CO2: 22 mmol/L (ref 20–29)
Calcium: 9.5 mg/dL (ref 8.7–10.2)
Chloride: 100 mmol/L (ref 96–106)
Creatinine, Ser: 0.57 mg/dL (ref 0.57–1.00)
Globulin, Total: 2.1 g/dL (ref 1.5–4.5)
Glucose: 90 mg/dL (ref 65–99)
Potassium: 3.8 mmol/L (ref 3.5–5.2)
Sodium: 140 mmol/L (ref 134–144)
Total Protein: 6.3 g/dL (ref 6.0–8.5)
eGFR: 107 mL/min/{1.73_m2} (ref >59)

## 2020-10-24 LAB — TSH: TSH: 1.31 u[IU]/mL (ref 0.450–4.500)

## 2020-10-24 LAB — PTH, INTACT AND CALCIUM: PTH: 29 pg/mL (ref 15–65)

## 2020-10-24 LAB — T4, FREE: Free T4: 1.32 ng/dL (ref 0.82–1.77)

## 2020-10-28 ENCOUNTER — Other Ambulatory Visit: Payer: Self-pay | Admitting: Family Medicine

## 2020-10-28 DIAGNOSIS — U071 COVID-19: Secondary | ICD-10-CM

## 2020-10-28 DIAGNOSIS — R110 Nausea: Secondary | ICD-10-CM

## 2020-10-30 ENCOUNTER — Ambulatory Visit: Payer: BC Managed Care – PPO | Admitting: "Endocrinology

## 2020-10-30 ENCOUNTER — Other Ambulatory Visit: Payer: Self-pay

## 2020-10-30 ENCOUNTER — Encounter: Payer: Self-pay | Admitting: "Endocrinology

## 2020-10-30 VITALS — BP 116/68 | HR 80 | Ht 62.0 in | Wt 104.6 lb

## 2020-10-30 DIAGNOSIS — M818 Other osteoporosis without current pathological fracture: Secondary | ICD-10-CM

## 2020-10-30 NOTE — Progress Notes (Signed)
10/30/2020        Endocrinology follow-up note  Past Medical History:  Diagnosis Date  . Anxiety   . GERD (gastroesophageal reflux disease)   . Hypercholesterolemia   . Pre-diabetes   . Reactive airway disease   . Tachycardia    Past Surgical History:  Procedure Laterality Date  . CATARACT EXTRACTION W/PHACO Left 01/17/2017   Procedure: CATARACT EXTRACTION PHACO AND INTRAOCULAR LENS PLACEMENT (IOC);  Surgeon: Tonny Branch, MD;  Location: [facility];  Service: Ophthalmology;  Laterality: Left;  CDE: 6.92  . CATARACT EXTRACTION W/PHACO Right 03/07/2020   Procedure: CATARACT EXTRACTION PHACO AND INTRAOCULAR LENS PLACEMENT RIGHT EYE;  Surgeon: Baruch Goldmann, MD;  Location: [facility];  Service: Ophthalmology;  Laterality: Right;  CDE: 8.44  . COLONOSCOPY  2013   RMR: Normal rectum. single diminutive sigmoid polyp noted above. Normal terminal ileum. Status post segmental biopsy. next TCS in 10 years.   . ESOPHAGOGASTRODUODENOSCOPY  2013   RMR: Possible cervical esophageal web-status post dilation as described above. Small hiatal hernia. Status post biopsy of normal appearing small bowel to screen for celiac disease.   . ESOPHAGOGASTRODUODENOSCOPY N/A 05/06/2016   Procedure: ESOPHAGOGASTRODUODENOSCOPY (EGD);  Surgeon: Daneil Dolin, MD;  Location: [facility];  Service: Endoscopy;  Laterality: N/A;  3:00 pm - pt knows to arrive at 2:30  . MALONEY DILATION N/A 05/06/2016   Procedure: Venia Minks DILATION;  Surgeon: Daneil Dolin, MD;  Location: [facility];  Service: Endoscopy;  Laterality: N/A;  . NECK SURGERY  10/2012  . OVARIAN CYST SURGERY  1997   removed  . TUBAL LIGATION  2000   Social History   Socioeconomic History  . Marital status: Married    Spouse name: Not on file  . Number of children: 2  . Years of education: GED  . Highest education level: Not on file  Occupational History  . 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 Smoking status: Current Every Day Smoker    Packs/day: 1.00    Years: 25.00    Pack years: 25.00    Types: Cigarettes  . Smokeless tobacco: Never Used  Vaping Use  . Vaping Use: Never used  Substance and Sexual Activity  . Alcohol use: No    Alcohol/week: 0.0 standard drinks  . Drug use: No  . Sexual activity: Yes    Birth control/protection: Post-menopausal, Surgical  Other Topics Concern  . Not on file  Social History Narrative   Lives at home w/ her husband   2 children - ages 40 & 62   Right-sided   Caffeine: 2 beverages per day   Social Determinants of Health   Financial Resource Strain: Not on file  Food Insecurity: Not on file  Transportation Needs: Not on file  Physical Activity: Not on file  Stress: Not on file  Social Connections: Not on file   Outpatient Encounter Medications as of 10/30/2020  Medication Sig  . acetaminophen (TYLENOL) 500 MG tablet Take 500 mg by mouth every 6 (six) hours as needed for moderate pain or headache.  . albuterol (VENTOLIN HFA) 108 (90 Base)  MCG/ACT inhaler Inhale 2 puffs into the lungs every 6 (six) hours as needed for wheezing or shortness of breath.  . ALPRAZolam (XANAX) 0.25 MG tablet TAKE 1/2 TO 1 TABLET BY MOUTH TWICE DAILY AS NEEDED FOR ANXIETY. (Patient taking differently: Take 0.625 mg by mouth daily as needed for anxiety.)  . Ascorbic Acid (VITAMIN C PO) Take 1 tablet by mouth daily. With elderberry  . aspirin EC 81 MG tablet Take 81 mg by mouth daily. Swallow whole.  Marland Kitchen CALCIUM PO Take 750 mg by mouth 2 (two) times daily with a meal.  . cholecalciferol (VITAMIN D3) 25 MCG (1000 UNIT) tablet Take 2,000 Units by mouth daily.  . cycloSPORINE (RESTASIS) 0.05 % ophthalmic emulsion Place 1 drop into both eyes 2 (two) times daily.   Marland Kitchen ibuprofen (ADVIL) 600 MG tablet Take 1 tablet (600 mg total) by mouth every 6 (six) hours as needed.  . Multiple Vitamins-Minerals (MULTIVITAMIN WITH  MINERALS) tablet Take 1 tablet by mouth daily.   Marland Kitchen nystatin (MYCOSTATIN) 100000 UNIT/ML suspension Swish and swallow one teaspoon QID for 7 days  . omeprazole (PRILOSEC) 40 MG capsule Take 1 capsule (40 mg total) by mouth daily.  . ondansetron (ZOFRAN-ODT) 8 MG disintegrating tablet TAKE 1 TABLET EVERY 8 HOURS AS NEEDED FOR NAUSEA  . rosuvastatin (CRESTOR) 5 MG tablet Take one tablet po each day  . silver sulfADIAZINE (SILVADENE) 1 % cream Apply BID prn (Patient taking differently: Apply 1 application topically 2 (two) times daily as needed (burns).)  . Triamcinolone Acetonide (NASACORT ALLERGY 24HR NA) Place 2 sprays into the nose daily.   No facility-administered encounter medications on file as of 10/30/2020.   ALLERGIES: Allergies  Allergen Reactions  . Ciprofloxacin Other (See Comments)    Patient states with in five minutes of taking developed a splitting headache,itching all over and burning in mouth and lips.  . Augmentin [Amoxicillin-Pot Clavulanate] Diarrhea  . Carafate [Sucralfate]     Mouth tingling  . Celexa [Citalopram Hydrobromide] Nausea And Vomiting  . Chantix [Varenicline] Nausea And Vomiting  . Sulfa Antibiotics Itching  . Wellbutrin [Bupropion]     Severe weight loss and loss of appetite  . Fosamax [Alendronate Sodium] Nausea And Vomiting    GERD can not tolerate    VACCINATION STATUS: Immunization History  Administered Date(s) Administered  . Influenza,inj,Quad PF,6+ Mos 09/14/2017, 07/04/2018, 04/25/2019, 07/14/2020  . PFIZER(Purple Top)SARS-COV-2 Vaccination 10/14/2019, 11/04/2019  . Pneumococcal Polysaccharide-23 04/25/2019  . Tdap 01/19/2018    HPI   Isabel Atkinson is 55 y.o. female who presents today with a medical history as above. she is being seen in follow-up after she was seen in consultation for osteoporosis requested by Kathyrn Drown, MD.  Patient was diagnosed with osteoporosis  approximately  8 years ago. She denies fractures or falls. No  dizziness/vertigo/orthostasis. She was treated with Fosamax which she did not tolerate.  She was also treated with what appears to be Reclast infusion also did not tolerate.  For the last 5 years, she was not on any treatment. She has history of vitamin D deficiency currently on vitamin D and calcium supplements.  She also eats dairy and green, leafy, vegetables.  She was sent here to explore her next options of treatment for osteoporosis. No weight bearing exercises.  She denies loss of height. She has prior exposure to on and off steroids due to COPD/reactive airway disease, however denies chronic exposure to heavy dose steroids. She denies any prior history of parathyroid, thyroid  dysfunctions.  No history of nephrolithiasis.  Her most recent calcium level is 9.6.  No h/o CKD. Last BUN/Cr: 15/0.56  Menopause was at 55 y/o.   Pt does not have a FH of osteoporosis.  She has dental implants, however no scheduled dental procedure in the next near future.   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  She also has history of irritable bowel syndrome, prediabetes.  Review of Systems  Constitutional: + No recent major weight change, she has always been light build,  + fatigue, no subjective hyperthermia, no subjective hypothermia Eyes: no blurry vision, no xerophthalmia ENT: no sore throat, no nodules palpated in throat, no dysphagia/odynophagia, no hoarseness Cardiovascular: no Chest Pain, no Shortness of Breath, no palpitations, no leg swelling Respiratory: + cough, no SOB Gastrointestinal: no Nausea/Vomiting/Diarhhea Musculoskeletal: no muscle/joint aches Skin: no rashes Neurological: no tremors, no numbness, no tingling, no dizziness Psychiatric: no depression, no anxiety  Objective:    BP 116/68   Pulse 80   Ht 5\' 2"  (1.575 m)   Wt 104 lb 9.6 oz (47.4 kg)   BMI 19.13 kg/m   Wt Readings from Last 3 Encounters:   10/30/20 104 lb 9.6 oz (47.4 kg)  10/16/20 103 lb 9.6 oz (47 kg)  07/31/20 100 lb 12.8 oz (45.7 kg)    Physical Exam  Constitutional: + BMI of 18.9, not in acute distress, normal state of mind Eyes: PERRLA, EOMI, no exophthalmos ENT: moist mucous membranes, no thyromegaly, no cervical lymphadenopathy Cardiovascular: normal precordial activity, Regular Rate and Rhythm, no Murmur/Rubs/Gallops Respiratory:  adequate breathing efforts, no gross chest deformity, Clear to auscultation bilaterally Gastrointestinal: abdomen soft, Non -tender, No distension, Bowel Sounds present Musculoskeletal: no gross deformities, strength intact in all four extremities Skin: moist, warm, no rashes, + scar on neck from prior cervical vertebral surgery. Neurological: no tremor with outstretched hands, Deep tendon reflexes normal in all four extremities.  CMP ( most recent) CMP     Component Value Date/Time   NA 140 10/23/2020 1402   K 3.8 10/23/2020 1402   CL 100 10/23/2020 1402   CO2 22 10/23/2020 1402   GLUCOSE 90 10/23/2020 1402   GLUCOSE 111 (H) 01/11/2017 1440   BUN 17 10/23/2020 1402   CREATININE 0.57 10/23/2020 1402   CREATININE 0.55 11/03/2013 0951   CALCIUM 9.5 10/23/2020 1402   PROT 6.3 10/23/2020 1402   PROT 7.0 12/09/2011 0900   ALBUMIN 4.2 10/23/2020 1402   AST 18 10/23/2020 1402   AST 18 12/09/2011 0900   ALT 11 10/23/2020 1402   ALKPHOS 85 10/23/2020 1402   ALKPHOS 76 12/09/2011 0900   BILITOT <0.2 10/23/2020 1402   BILITOT 0.3 12/09/2011 0900   GFRNONAA 105 01/11/2020 0811   GFRAA 121 01/11/2020 0811    Lipid Panel     Component Value Date/Time   CHOL 182 06/20/2020 0813   TRIG 50 06/20/2020 0813   HDL 78 06/20/2020 0813   CHOLHDL 2.3 06/20/2020 0813   CHOLHDL 2.5 11/03/2013 0951   VLDL 13 11/03/2013 0951   LDLCALC 94 06/20/2020 0813   LABVLDL 10 06/20/2020 0813      Lab Results  Component Value Date   TSH 1.310 10/23/2020   TSH 1.430 01/11/2020   TSH 1.110  12/01/2017   TSH 1.030 01/14/2016   TSH 1.030 09/10/2015   TSH 0.962 04/25/2015   TSH 0.924 11/03/2013   FREET4 1.32 10/23/2020   FREET4  1.45 01/14/2016     Bone density on October 08, 2020 [facility] L1-L4 10/08/2020 56.4 Osteoporosis -2.9 0.834 g/cm2 -9.9% [facility] L1-L4 06/26/2015 51.1 Osteopenia -2.1 0.926 g/cm2 4.2% - [facility] L1-L4 05/18/2013 49.0 Osteopenia -2.4 0.889 g/cm2 - -  DualFemur Neck Right 10/08/2020 56.4 Osteoporosis -2.7 0.659 g/cm2 -7.7%  DualFemur Neck Right 06/26/2015 51.1 Osteopenia -2.3 0.714 g/cm2 4.7% - DualFemur Neck Right 05/18/2013 49.0 Osteoporosis -2.6 0.682 g/cm2 -  DualFemur Total Mean 10/08/2020 56.4 Osteopenia -2.2 0.726 g/cm2 -9.5%  DualFemur Total Mean 06/26/2015 51.1 Osteopenia -1.6 0.802 g/cm2 3.9%  DualFemur Total Mean 05/18/2013 49.0 Osteopenia -1.9 0.772 g/cm2 -    ASSESSMENT: BMD as determined from [facility] L1-L4 is 0.834 g/cm2 with a T-Score of -2.9. This patient is considered osteoporotic according to [facility]) criteria.    Assessment: 1. Osteoporosis 2.  Bisphosphonates intolerance, GERD  Plan: 1. Osteoporosis -Likely multifactorial including postmenopausal, steroids, smoking.   -I discussed her available bone density studies, 3 studies between 2014 and 2022 with her, discussed about increased risk of fracture, depending on the T score. - We discussed about the different medication classes, benefits and side effects (including atypical fractures and ONJ - no dental workup in progress or planned).  - I explained that, since she has severe GERD, she is not a candidate for oral bisphosphonates, she did not tolerate IV bisphosphonates either.   -Her next suitable option seems to be denosumab subcutaneously.  She has normal renal function, normal calcium and vitamin D.  She has no evidence of primary hyperparathyroidism nor thyroid dysfunction.  She will be given her first dose today with plan to continue every  27-month preceded by measurement of calcium/vitamin D, and renal function.   -She is made aware of the fact that she cannot withdrawal Prolia treatment without proper replacement/backup to avoid atypical fracture. - I explained the mechanism of action and expected benefits.   - we reviewed her dietary and supplemental calcium and vitamin D intake, which I advised her to increase her vitamin D to 2000 units daily, calcium 750 mg twice a day.  We also discussed food sources of this elements and vitamin.  - discussed fall precautions   -She is chronic active smoker: The patient was counseled on the dangers of tobacco use, and was advised to quit.  Reviewed strategies to maximize success, including removing cigarettes and smoking materials from environment. -Her next bone density is not due until March 2024.     She was observed for 15 minutes after Prolia injection for reactions.  She was stable to discharge.  - I advised patient to maintain close follow up with Kathyrn Drown, MD for primary care needs.    - Time spent on this patient care encounter:  30 minutes of which 50% was spent in  counseling and the rest reviewing  her current and  previous labs / studies and medications  doses and developing a plan for long term care, and documenting this care. Karena Addison  participated in the discussions, expressed understanding, and voiced agreement with the above plans.  All questions were answered to her satisfaction. she is encouraged to contact clinic should she have any questions or concerns prior to her return visit.   Follow up plan: Return in about 6 months (around 05/02/2021), or prolia injection today, for Prolia During NV.   Glade Lloyd, MD [facility] [address] [place], [place] [address] Phone: [phone]  Fax: [phone]     10/30/2020, 3:40 PM  This note was partially dictated with voice recognition software.  Similar sounding words can be transcribed inadequately or may not  be corrected upon review.

## 2020-11-05 ENCOUNTER — Telehealth: Payer: Self-pay

## 2020-11-05 NOTE — Telephone Encounter (Signed)
Pt states she has some ulcers in her mouth from time to time and magic mouth wash usually helps.

## 2020-11-05 NOTE — Telephone Encounter (Signed)
Pt needs some Magic Mouth Wash called into [place]  Pt call back [phone]

## 2020-11-05 NOTE — Telephone Encounter (Signed)
Magic mouthwash 1 teaspoon swish and spit 4 times daily as needed for 7 days, 150 mL, 4 refills, if prescription has to be printed please have Dr. Lovena Le or Santiago Glad sign for prescription then send that prescription into the pharmacy

## 2020-11-06 NOTE — Telephone Encounter (Signed)
Prescription called into [place]. Left message to return call

## 2020-11-06 NOTE — Telephone Encounter (Signed)
Pt returned call and verbalized understanding  

## 2020-12-17 NOTE — Progress Notes (Addendum)
[facility] Note  12/18/2020     CHIEF COMPLAINT Patient presents for Retina Evaluation   HISTORY OF PRESENT ILLNESS: Isabel Atkinson is a 55 y.o. female who presents to the clinic today for:   HPI    Retina Evaluation    In right eye.  This started months ago.  Duration of months.  Associated Symptoms Pain.  Negative for Flashes, Floaters, Distortion, Blind Spot, Redness, Photophobia, Glare, Trauma, Scalp Tenderness, Jaw Claudication, Shoulder/Hip pain, Fever, Weight Loss and Fatigue.  Context:  distance vision, mid-range vision and near vision.  Treatments tried include eye drops, artificial tears and laser.  Response to treatment was mild improvement.  I, the attending physician,  performed the HPI with the patient and updated documentation appropriately.          Comments    55 y/o female pt referred by Dr. 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  VA OD blurred s/p cat sx several mos ago.  VA good OS .  Denies FOL, floaters, but has intermittent dull pain behind her right eye a couple of times per month that lasts for about 2 hrs.  Restasis BID OU.       Last edited by Bernarda Caffey, MD on 12/18/2020  4:37 PM. (History)    pt is here on the referral of Dr. Marisa Atkinson for concern of VMT OD, pt had cataract sx with him last June, she states the surgery helped her vision, but both eyes became inflamed afterwards, pt states she went to see him last Monday and her vision was fine, but by last Thursday, her vision had gotten blurry again, pt saw him again this Monday and that's when Dr. Marisa Atkinson noticed this new problem  Referring physician:  Baruch Goldmann, MD [id number] [place], [place]  [address]   HISTORICAL INFORMATION:   Selected notes from the MEDICAL RECORD NUMBER Referred by Dr. Marisa Atkinson LEE: 05.02.22 Ocular Hx- VMT OD, PCIOL OU, YAG OU, Ocular HTN, Steroid Responder BCVA: OD 20/80+, OS 20/20    CURRENT MEDICATIONS: Current  Outpatient Medications (Ophthalmic Drugs)  Medication Sig  . Bromfenac Sodium (PROLENSA) 0.07 % SOLN Place 1 drop into the right eye 4 (four) times daily.  . cycloSPORINE (RESTASIS) 0.05 % ophthalmic emulsion Place 1 drop into both eyes 2 (two) times daily.   . prednisoLONE acetate (PRED FORTE) 1 % ophthalmic suspension Place 1 drop into the right eye 4 (four) times daily.   No current facility-administered medications for this visit. (Ophthalmic Drugs)   Current Outpatient Medications (Other)  Medication Sig  . acetaminophen (TYLENOL) 500 MG tablet Take 500 mg by mouth every 6 (six) hours as needed for moderate pain or headache.  . albuterol (VENTOLIN HFA) 108 (90 Base) MCG/ACT inhaler Inhale 2 puffs into the lungs every 6 (six) hours as needed for wheezing or shortness of breath.  . ALPRAZolam (XANAX) 0.25 MG tablet TAKE 1/2 TO 1 TABLET BY MOUTH TWICE DAILY AS NEEDED FOR ANXIETY. (Patient taking differently: Take 0.625 mg by mouth daily as needed for anxiety.)  . Ascorbic Acid (VITAMIN C PO) Take 1 tablet by mouth daily. With elderberry  . aspirin EC 81 MG tablet Take 81 mg by mouth daily. Swallow whole.  . benzonatate (TESSALON) 100 MG capsule benzonatate 100 mg capsule  . CALCIUM PO Take 750 mg by mouth 2 (two) times daily with a meal.  . cefdinir (OMNICEF) 300 MG capsule cefdinir 300 mg capsule  .  cholecalciferol (VITAMIN D3) 25 MCG (1000 UNIT) tablet Take 2,000 Units by mouth daily.  . clindamycin (CLEOCIN) 300 MG capsule clindamycin HCl 300 mg capsule  . fluconazole (DIFLUCAN) 200 MG tablet fluconazole 200 mg tablet  . ibuprofen (ADVIL) 600 MG tablet Take 1 tablet (600 mg total) by mouth every 6 (six) hours as needed.  . Multiple Vitamins-Minerals (MULTIVITAMIN WITH MINERALS) tablet Take 1 tablet by mouth daily.   Marland Kitchen nystatin (MYCOSTATIN) 100000 UNIT/ML suspension Swish and swallow one teaspoon QID for 7 days  . omeprazole (PRILOSEC) 40 MG capsule Take 1 capsule (40 mg total) by mouth  daily.  . ondansetron (ZOFRAN-ODT) 8 MG disintegrating tablet TAKE 1 TABLET EVERY 8 HOURS AS NEEDED FOR NAUSEA  . predniSONE (DELTASONE) 10 MG tablet prednisone 10 mg tablet  . rosuvastatin (CRESTOR) 5 MG tablet Take one tablet po each day  . silver sulfADIAZINE (SILVADENE) 1 % cream Apply BID prn (Patient taking differently: Apply 1 application topically 2 (two) times daily as needed (burns).)  . traMADol (ULTRAM) 50 MG tablet tramadol 50 mg tablet  . Triamcinolone Acetonide (NASACORT ALLERGY 24HR NA) Place 2 sprays into the nose daily.   No current facility-administered medications for this visit. (Other)      REVIEW OF SYSTEMS: ROS    Positive for: Gastrointestinal, Neurological, Musculoskeletal, Cardiovascular, Eyes   Negative for: Constitutional, Skin, Genitourinary, HENT, Endocrine, Respiratory, Psychiatric, Allergic/Imm, Heme/Lymph   Last edited by Matthew Folks, COA on 12/18/2020  1:31 PM. (History)       ALLERGIES Allergies  Allergen Reactions  . Ciprofloxacin Other (See Comments)    Patient states with in five minutes of taking developed a splitting headache,itching all over and burning in mouth and lips.  . Augmentin [Amoxicillin-Pot Clavulanate] Diarrhea  . Carafate [Sucralfate]     Mouth tingling  . Celexa [Citalopram Hydrobromide] Nausea And Vomiting  . Chantix [Varenicline] Nausea And Vomiting  . Sulfa Antibiotics Itching  . Wellbutrin [Bupropion]     Severe weight loss and loss of appetite  . Fosamax [Alendronate Sodium] Nausea And Vomiting    GERD can not tolerate    PAST MEDICAL HISTORY Past Medical History:  Diagnosis Date  . Anxiety   . GERD (gastroesophageal reflux disease)   . Hypercholesterolemia   . Pre-diabetes   . Reactive airway disease   . Tachycardia    Past Surgical History:  Procedure Laterality Date  . CATARACT EXTRACTION W/PHACO Left 01/17/2017   Procedure: CATARACT EXTRACTION PHACO AND INTRAOCULAR LENS PLACEMENT (IOC);  Surgeon:  Tonny Branch, MD;  Location: [facility];  Service: Ophthalmology;  Laterality: Left;  CDE: 6.92  . CATARACT EXTRACTION W/PHACO Right 03/07/2020   Procedure: CATARACT EXTRACTION PHACO AND INTRAOCULAR LENS PLACEMENT RIGHT EYE;  Surgeon: Isabel Goldmann, MD;  Location: [facility];  Service: Ophthalmology;  Laterality: Right;  CDE: 8.44  . COLONOSCOPY  2013   RMR: Normal rectum. single diminutive sigmoid polyp noted above. Normal terminal ileum. Status post segmental biopsy. next TCS in 10 years.   . ESOPHAGOGASTRODUODENOSCOPY  2013   RMR: Possible cervical esophageal web-status post dilation as described above. Small hiatal hernia. Status post biopsy of normal appearing small bowel to screen for celiac disease.   . ESOPHAGOGASTRODUODENOSCOPY N/A 05/06/2016   Procedure: ESOPHAGOGASTRODUODENOSCOPY (EGD);  Surgeon: Daneil Dolin, MD;  Location: [facility];  Service: Endoscopy;  Laterality: N/A;  3:00 pm - pt knows to arrive at 2:30  . EYE SURGERY Bilateral    Cat Sx - Symfony MF IOLs  .  MALONEY DILATION N/A 05/06/2016   Procedure: Venia Minks DILATION;  Surgeon: Daneil Dolin, MD;  Location: [facility];  Service: Endoscopy;  Laterality: N/A;  . NECK SURGERY  10/2012  . OVARIAN CYST SURGERY  1997   removed  . TUBAL LIGATION  2000  . YAG LASER APPLICATION Bilateral     FAMILY HISTORY Family History  Problem Relation Age of Onset  . Colon cancer Maternal Grandfather 77  . Colon polyps Mother 69  . Colon cancer Maternal Uncle 14  . Hypertension Father   . Diabetes Father   . Heart disease Father   . Hyperlipidemia Father   . Stroke Father     SOCIAL HISTORY Social History   Tobacco Use  . Smoking status: Current Every Day Smoker    Packs/day: 1.00    Years: 25.00    Pack years: 25.00    Types: Cigarettes  . Smokeless tobacco: Never Used  Vaping Use  . Vaping Use: Never used  Substance Use Topics  . Alcohol use: No    Alcohol/week: 0.0 standard drinks  . Drug use: No          OPHTHALMIC EXAM:  Base Eye Exam    Visual Acuity (Snellen - Linear)      Right Left   Dist [place] 20/40 20/15 -2   Dist ph [place] NI        Tonometry (Tonopen, 1:35 PM)      Right Left   Pressure 15 16       Pupils      Dark Light Shape React APD   Right 3 2 Round Brisk None   Left 3 2 Round Brisk None       Visual Fields (Counting fingers)      Left Right    Full Full       Extraocular Movement      Right Left    Full, Ortho Full, Ortho       Neuro/Psych    Oriented x3: Yes   Mood/Affect: Normal       Dilation    Both eyes: 1.0% Mydriacyl, 2.5% Phenylephrine @ 1:35 PM        Slit Lamp and Fundus Exam    Slit Lamp Exam      Right Left   Lids/Lashes Dermatochalasis - upper lid, mild MGD Dermatochalasis - upper lid, mild MGD   Conjunctiva/Sclera nasal and temporal pinguecula, Conjunctivochalasis IT nasal and temporal pinguecula, Conjunctivochalasis IT   Cornea well healed cataract wound Mild tear film debris, well healed cataract wound   Anterior Chamber deep and clear, No cell or flare deep and clear   Iris Round and moderately dilated Round and moderately dilated   Lens MF PC IOL in good position with open PC MF PC IOL in good position with open PC   Vitreous Vitreous syneresis Vitreous syneresis       Fundus Exam      Right Left   Disc Pink and Sharp Pink and Sharp   C/D Ratio 0.3 0.3   Macula Blunted foveal reflex, central edema and cystic changes, ERM Flat, Blunted foveal reflex, mild RPE mottling, No heme or edema   Vessels attenuated, Tortuous, mild Copper wiring mild attenuation, mild Copper wiring   Periphery Attached    Attached           Refraction    Manifest Refraction      Sphere Cylinder Axis Dist VA   Right -0.50 +0.50 005 20/30-2  Left [place] Sphere  20/15-2          IMAGING AND PROCEDURES  Imaging and Procedures for 12/18/2020  OCT, Retina - OU - Both Eyes       Right Eye Quality was good. Central Foveal Thickness: 430.  Progression has no prior data. Findings include abnormal foveal contour, intraretinal fluid, subretinal fluid, vitreomacular adhesion , epiretinal membrane, vitreous traction (ERM and VMT with central IRF / SRF).   Left Eye Quality was good. Central Foveal Thickness: 290. Progression has no prior data. Findings include normal foveal contour, no IRF, no SRF (Partial PVD).   Notes *Images captured and stored on drive  Diagnosis / Impression:  OD: ERM and VMT with central IRF / SRF OS: NFP, no IRF/SRF  Clinical management:  See below  Abbreviations: NFP - Normal foveal profile. CME - cystoid macular edema. PED - pigment epithelial detachment. IRF - intraretinal fluid. SRF - subretinal fluid. EZ - ellipsoid zone. ERM - epiretinal membrane. ORA - outer retinal atrophy. ORT - outer retinal tubulation. SRHM - subretinal hyper-reflective material. IRHM - intraretinal hyper-reflective material                 ASSESSMENT/PLAN:    ICD-10-CM   1. Retinal edema  H35.81 OCT, Retina - OU - Both Eyes  2. Vitreomacular adhesion of right eye  H43.821   3. Epiretinal membrane (ERM) of right eye  H35.371   4. CME (cystoid macular edema), right  H35.351   5. Pseudophakia, both eyes  Z96.1     1-4. Central retinal edema OD  - central cystic changes -- likely multifactorial  - ERM and VMT possibly contributing  - pt reports history of prolonged inflammation post cataract surgeries and YAGs OU -- [place]  - suspect majority of edema OD driven by CME post YAG cap OD (on 4.18.22)  - recommend PF and Prolensa QID OD -- for possible CME component  - pt has Durezol at home -- will use that first, then switch to PF once Durezol runs out  - f/u in 3-4 wks -- DFE/OCT, possible FA  5. Pseudophakia OU  - s/p CE/IOL OU (OD - Dr. Marisa Atkinson, 7.30.2021; OS - Dr. Geoffry Paradise, 6.11.2018)  - IOLs in good position, doing well  - s/p YAG OU (OD 4.18.22) -- may have triggered CME OD  - monitor     Ophthalmic  Meds Ordered this visit:  Meds ordered this encounter  Medications  . prednisoLONE acetate (PRED FORTE) 1 % ophthalmic suspension    Sig: Place 1 drop into the right eye 4 (four) times daily.    Dispense:  15 mL    Refill:  0  . Bromfenac Sodium (PROLENSA) 0.07 % SOLN    Sig: Place 1 drop into the right eye 4 (four) times daily.    Dispense:  3 mL    Refill:  3       Return for f/u 3-4 weeks, VMT OD, DFE, OCT.  There are no Patient Instructions on file for this visit.   Explained the diagnoses, plan, and follow up with the patient and they expressed understanding.  Patient expressed understanding of the importance of proper follow up care.   This document serves as a record of services personally performed by Gardiner Sleeper, MD, PhD. It was created on their behalf by Leonie Douglas, an ophthalmic technician. The creation of this record is the provider's dictation and/or activities during the visit.    Electronically signed by: Shirlean Mylar  Darci Needle, 12/18/20  4:46 PM   This document serves as a record of services personally performed by Gardiner Sleeper, MD, PhD. It was created on their behalf by San Jetty. Owens Shark, OA an ophthalmic technician. The creation of this record is the provider's dictation and/or activities during the visit.    Electronically signed by: San Jetty. Marguerita Merles 05.12.2022 4:46 PM  Gardiner Sleeper, M.D., Ph.D. Diseases & Surgery of the Retina and [place] 12/18/2020  I have reviewed the above documentation for accuracy and completeness, and I agree with the above. Gardiner Sleeper, M.D., Ph.D. 12/18/20 4:46 PM   Abbreviations: M myopia (nearsighted); A astigmatism; H hyperopia (farsighted); P presbyopia; Mrx spectacle prescription;  CTL contact lenses; OD right eye; OS left eye; OU both eyes  XT exotropia; ET esotropia; PEK punctate epithelial keratitis; PEE punctate epithelial erosions; DES dry eye syndrome; MGD meibomian gland  dysfunction; ATs artificial tears; PFAT's preservative free artificial tears; [place] nuclear sclerotic cataract; PSC posterior subcapsular cataract; ERM epi-retinal membrane; PVD posterior vitreous detachment; RD retinal detachment; DM diabetes mellitus; DR diabetic retinopathy; NPDR non-proliferative diabetic retinopathy; PDR proliferative diabetic retinopathy; CSME clinically significant macular edema; DME diabetic macular edema; dbh dot blot hemorrhages; CWS cotton wool spot; POAG primary open angle glaucoma; C/D cup-to-disc ratio; HVF humphrey visual field; GVF Atkinson visual field; OCT optical coherence tomography; IOP intraocular pressure; BRVO Branch retinal vein occlusion; CRVO central retinal vein occlusion; CRAO central retinal artery occlusion; BRAO branch retinal artery occlusion; RT retinal tear; SB scleral buckle; PPV pars plana vitrectomy; VH Vitreous hemorrhage; PRP panretinal laser photocoagulation; IVK intravitreal kenalog; VMT vitreomacular traction; MH Macular hole;  NVD neovascularization of the disc; NVE neovascularization elsewhere; AREDS age related eye disease study; ARMD age related macular degeneration; POAG primary open angle glaucoma; EBMD epithelial/anterior basement membrane dystrophy; ACIOL anterior chamber intraocular lens; IOL intraocular lens; PCIOL posterior chamber intraocular lens; Phaco/IOL phacoemulsification with intraocular lens placement; [place] photorefractive keratectomy; LASIK laser assisted in situ keratomileusis; HTN hypertension; DM diabetes mellitus; COPD chronic obstructive pulmonary disease

## 2020-12-18 ENCOUNTER — Ambulatory Visit (INDEPENDENT_AMBULATORY_CARE_PROVIDER_SITE_OTHER): Payer: BC Managed Care – PPO | Admitting: Ophthalmology

## 2020-12-18 ENCOUNTER — Other Ambulatory Visit: Payer: Self-pay

## 2020-12-18 ENCOUNTER — Encounter (INDEPENDENT_AMBULATORY_CARE_PROVIDER_SITE_OTHER): Payer: Self-pay | Admitting: Ophthalmology

## 2020-12-18 DIAGNOSIS — H3581 Retinal edema: Secondary | ICD-10-CM | POA: Diagnosis not present

## 2020-12-18 DIAGNOSIS — H35351 Cystoid macular degeneration, right eye: Secondary | ICD-10-CM

## 2020-12-18 DIAGNOSIS — H35371 Puckering of macula, right eye: Secondary | ICD-10-CM | POA: Diagnosis not present

## 2020-12-18 DIAGNOSIS — H43821 Vitreomacular adhesion, right eye: Secondary | ICD-10-CM

## 2020-12-18 DIAGNOSIS — Z961 Presence of intraocular lens: Secondary | ICD-10-CM

## 2020-12-18 MED ORDER — PREDNISOLONE ACETATE 1 % OP SUSP: 1.0000 [drp] | Freq: Four times a day (QID) | OPHTHALMIC | 0 refills | Status: DC

## 2020-12-18 MED ORDER — PROLENSA 0.07 % OP SOLN: 1.0000 [drp] | Freq: Four times a day (QID) | OPHTHALMIC | 3 refills | Status: DC

## 2020-12-30 ENCOUNTER — Other Ambulatory Visit: Payer: Self-pay

## 2020-12-30 ENCOUNTER — Encounter: Payer: Self-pay | Admitting: Family Medicine

## 2020-12-30 ENCOUNTER — Ambulatory Visit: Payer: BC Managed Care – PPO | Admitting: Family Medicine

## 2020-12-30 VITALS — HR 85 | Temp 99.3°F | Ht 62.0 in | Wt 103.0 lb

## 2020-12-30 DIAGNOSIS — J989 Respiratory disorder, unspecified: Secondary | ICD-10-CM

## 2020-12-30 DIAGNOSIS — J019 Acute sinusitis, unspecified: Secondary | ICD-10-CM

## 2020-12-30 NOTE — Progress Notes (Signed)
   Subjective:    Patient ID: Isabel Atkinson, female    DOB: 06/03/1966, 55 y.o.   MRN: [id number]  HPI  Patient presents today with respiratory illness Number of days present- 2 days   Symptoms include-cough, fever, feels weak  Presence of worrisome signs (severe shortness of breath, lethargy, etc.) - none  Recent/current visit to urgent care or ER- none  Recent direct exposure to Covid- none  Any current Covid testing- negative home test this morning   Review of Systems     Objective:   Physical Exam  Eardrums are normal throat is normal lungs are clear  Mild sinus tenderness but it is not felt to be bacterial at this point    Assessment & Plan:  Viral syndrome Has COPD No flareup currently Handwritten prescription for antibiotics given to fill within the next 10 days if progressive symptoms occur If gradually better over the next 7 days no need for antibiotics Call us if any progressive troubles or problems

## 2021-01-01 ENCOUNTER — Telehealth: Payer: Self-pay | Admitting: Family Medicine

## 2021-01-01 ENCOUNTER — Encounter: Payer: Self-pay | Admitting: Family Medicine

## 2021-01-01 NOTE — Telephone Encounter (Signed)
Patient is requesting another work note due to fever.She was out on 5/25-5/26,returninng on 5/27

## 2021-01-01 NOTE — Telephone Encounter (Signed)
Please give work note as requested

## 2021-01-06 ENCOUNTER — Encounter (HOSPITAL_COMMUNITY): Payer: Self-pay

## 2021-01-06 NOTE — Progress Notes (Signed)
I have attempted to reach the patient regarding follow-up LDCT. Unable to reach the patient at this time. Detailed VM left asking that the patient return my call.

## 2021-01-12 NOTE — Progress Notes (Signed)
[facility] Note  01/16/2021     CHIEF COMPLAINT Patient presents for Retina Follow Up   HISTORY OF PRESENT ILLNESS: Isabel Atkinson is a 55 y.o. female who presents to the clinic today for:   HPI     Retina Follow Up   Patient presents with  Other.  In right eye.  This started 4 weeks ago.  Severity is moderate.  Duration of 4 weeks.  Since onset it is gradually improving.  I, the attending physician,  performed the HPI with the patient and updated documentation appropriately.        Comments   55 y/o female pt here for 4 wk f/u for central retinal edema OD.  Feels like VA OD is gradually improving.  No change in [place] OS.  Denies pain, FOL, floaters.  Prolensa QID OD.  Has not been using steroid gtt; pt misunderstood gtt directions.      Last edited by Bernarda Caffey, MD on 01/18/2021  2:31 AM.    pt   Referring physician:  Baruch Goldmann, MD [address] [place], [place]  [address]   HISTORICAL INFORMATION:   Selected notes from the MEDICAL RECORD NUMBER Referred by Dr. Marisa Hua LEE: 05.02.22 Ocular Hx- VMT OD, PCIOL OU, YAG OU, Ocular HTN, Steroid Responder BCVA: OD 20/80+, OS 20/20    CURRENT MEDICATIONS: Current Outpatient Medications (Ophthalmic Drugs)  Medication Sig   Bromfenac Sodium (PROLENSA) 0.07 % SOLN Place 1 drop into the right eye 4 (four) times daily.   cycloSPORINE (RESTASIS) 0.05 % ophthalmic emulsion Place 1 drop into both eyes 2 (two) times daily.  (Patient not taking: Reported on 01/16/2021)   prednisoLONE acetate (PRED FORTE) 1 % ophthalmic suspension Place 1 drop into the right eye 4 (four) times daily. (Patient not taking: Reported on 01/16/2021)   No current facility-administered medications for this visit. (Ophthalmic Drugs)   Current Outpatient Medications (Other)  Medication Sig   acetaminophen (TYLENOL) 500 MG tablet Take 500 mg by mouth every 6 (six) hours as needed for moderate pain or headache.    albuterol (VENTOLIN HFA) 108 (90 Base) MCG/ACT inhaler Inhale 2 puffs into the lungs every 6 (six) hours as needed for wheezing or shortness of breath.   ALPRAZolam (XANAX) 0.25 MG tablet TAKE 1/2 TO 1 TABLET BY MOUTH TWICE DAILY AS NEEDED FOR ANXIETY. (Patient taking differently: Take 0.625 mg by mouth daily as needed for anxiety.)   Ascorbic Acid (VITAMIN C PO) Take 1 tablet by mouth daily. With elderberry   aspirin EC 81 MG tablet Take 81 mg by mouth daily. Swallow whole.   CALCIUM PO Take 750 mg by mouth 2 (two) times daily with a meal.   cholecalciferol (VITAMIN D3) 25 MCG (1000 UNIT) tablet Take 2,000 Units by mouth daily.   ibuprofen (ADVIL) 600 MG tablet Take 1 tablet (600 mg total) by mouth every 6 (six) hours as needed.   Multiple Vitamins-Minerals (MULTIVITAMIN WITH MINERALS) tablet Take 1 tablet by mouth daily.    nystatin (MYCOSTATIN) 100000 UNIT/ML suspension Swish and swallow one teaspoon QID for 7 days   omeprazole (PRILOSEC) 40 MG capsule Take 1 capsule (40 mg total) by mouth daily.   ondansetron (ZOFRAN-ODT) 8 MG disintegrating tablet TAKE 1 TABLET EVERY 8 HOURS AS NEEDED FOR NAUSEA   predniSONE (STERAPRED UNI-PAK 21 TAB) 10 MG (21) TBPK tablet Take by mouth.   rosuvastatin (CRESTOR) 5 MG tablet Take one tablet po each day  silver sulfADIAZINE (SILVADENE) 1 % cream Apply BID prn (Patient taking differently: Apply 1 application topically 2 (two) times daily as needed (burns).)   traMADol (ULTRAM) 50 MG tablet tramadol 50 mg tablet   Triamcinolone Acetonide (NASACORT ALLERGY 24HR NA) Place 2 sprays into the nose daily.   No current facility-administered medications for this visit. (Other)      REVIEW OF SYSTEMS: ROS   Positive for: Gastrointestinal, Musculoskeletal, Cardiovascular, Eyes, Respiratory Negative for: Constitutional, Neurological, Skin, Genitourinary, HENT, Endocrine, Psychiatric, Allergic/Imm, Heme/Lymph Last edited by Matthew Folks, COA on 01/16/2021   9:31 AM.       ALLERGIES Allergies  Allergen Reactions   Ciprofloxacin Other (See Comments)    Patient states with in five minutes of taking developed a splitting headache,itching all over and burning in mouth and lips.   Augmentin [Amoxicillin-Pot Clavulanate] Diarrhea   Carafate [Sucralfate]     Mouth tingling   Celexa [Citalopram Hydrobromide] Nausea And Vomiting   Chantix [Varenicline] Nausea And Vomiting   Sulfa Antibiotics Itching   Wellbutrin [Bupropion]     Severe weight loss and loss of appetite   Fosamax [Alendronate Sodium] Nausea And Vomiting    GERD can not tolerate    PAST MEDICAL HISTORY Past Medical History:  Diagnosis Date   Anxiety    GERD (gastroesophageal reflux disease)    Hypercholesterolemia    Pre-diabetes    Reactive airway disease    Tachycardia    Past Surgical History:  Procedure Laterality Date   CATARACT EXTRACTION W/PHACO Left 01/17/2017   Procedure: CATARACT EXTRACTION PHACO AND INTRAOCULAR LENS PLACEMENT ([place]);  Surgeon: Tonny Branch, MD;  Location: [facility];  Service: Ophthalmology;  Laterality: Left;  CDE: 6.92   CATARACT EXTRACTION W/PHACO Right 03/07/2020   Procedure: CATARACT EXTRACTION PHACO AND INTRAOCULAR LENS PLACEMENT RIGHT EYE;  Surgeon: Baruch Goldmann, MD;  Location: [facility];  Service: Ophthalmology;  Laterality: Right;  CDE: 8.44   COLONOSCOPY  2013   RMR: Normal rectum. single diminutive sigmoid polyp noted above. Normal terminal ileum. Status post segmental biopsy. next TCS in 10 years.    ESOPHAGOGASTRODUODENOSCOPY  2013   RMR: Possible cervical esophageal web-status post dilation as described above. Small hiatal hernia. Status post biopsy of normal appearing small bowel to screen for celiac disease.    ESOPHAGOGASTRODUODENOSCOPY N/A 05/06/2016   Procedure: ESOPHAGOGASTRODUODENOSCOPY (EGD);  Surgeon: Daneil Dolin, MD;  Location: [facility];  Service: Endoscopy;  Laterality: N/A;  3:00 pm - pt knows to arrive at 2:30   EYE  SURGERY Bilateral    Cat Sx - [place] N/A 05/06/2016   Procedure: Venia Minks DILATION;  Surgeon: Daneil Dolin, MD;  Location: [facility];  Service: Endoscopy;  Laterality: N/A;   NECK SURGERY  10/2012   OVARIAN CYST SURGERY  1997   removed   TUBAL LIGATION  7425   YAG LASER APPLICATION Bilateral     FAMILY HISTORY Family History  Problem Relation Age of Onset   Colon cancer Maternal Grandfather 22   Colon polyps Mother 36   Colon cancer Maternal Uncle 71   Hypertension Father    Diabetes Father    Heart disease Father    Hyperlipidemia Father    Stroke Father     SOCIAL HISTORY Social History   Tobacco Use   Smoking status: Every Day    Packs/day: 1.00    Years: 25.00    Pack years: 25.00    Types: Cigarettes  Smokeless tobacco: Never  Vaping Use   Vaping Use: Never used  Substance Use Topics   Alcohol use: No    Alcohol/week: 0.0 standard drinks   Drug use: No         OPHTHALMIC EXAM:  Base Eye Exam     Visual Acuity (Snellen - Linear)       Right Left   Dist [place] 20/30 - 20/20 -   Dist ph [facility]          Tonometry (Tonopen, 9:35 AM)       Right Left   Pressure 14 18         Pupils       Dark Light Shape React APD   Right 3 2 Round Brisk None   Left 3 2 Round Brisk None         Visual Fields (Counting fingers)       Left Right    Full Full         Extraocular Movement       Right Left    Full, Ortho Full, Ortho         Neuro/Psych     Oriented x3: Yes   Mood/Affect: Normal         Dilation     Both eyes: 1.0% Mydriacyl, 2.5% Phenylephrine @ 9:35 AM           Slit Lamp and Fundus Exam     Slit Lamp Exam       Right Left   Lids/Lashes Dermatochalasis - upper lid, mild MGD Dermatochalasis - upper lid, mild MGD   Conjunctiva/Sclera nasal and temporal pinguecula, Conjunctivochalasis IT nasal and temporal pinguecula, Conjunctivochalasis IT   Cornea well healed cataract wound Mild  tear film debris, well healed cataract wound   Anterior Chamber deep and clear, No cell or flare deep and clear   Iris Round and moderately dilated Round and moderately dilated   Lens MF PC IOL in good position with open PC MF PC IOL in good position with open PC   Vitreous Vitreous syneresis Vitreous syneresis         Fundus Exam       Right Left   Disc Pink and Sharp Pink and Sharp   C/D Ratio 0.3 0.3   Macula Blunted foveal reflex, central edema and cystic changes - improved, ERM Flat, Blunted foveal reflex, mild RPE mottling, No heme or edema   Vessels attenuated, Tortuous, mild Copper wiring mild attenuation, mild Copper wiring   Periphery Attached    Attached               IMAGING AND PROCEDURES  Imaging and Procedures for 01/16/2021  OCT, Retina - OU - Both Eyes       Right Eye Quality was good. Central Foveal Thickness: 399. Progression has improved. Findings include abnormal foveal contour, intraretinal fluid, vitreomacular adhesion , epiretinal membrane, vitreous traction, no SRF (Interval improvement in central edema, persistent ERM/VMT).   Left Eye Quality was good. Central Foveal Thickness: 290. Progression has been stable. Findings include normal foveal contour, no IRF, no SRF (Partial PVD).   Notes *Images captured and stored on drive  Diagnosis / Impression:  OD: Interval improvement in central edema, persistent ERM/VMT OS: NFP, no IRF/SRF  Clinical management:  See below  Abbreviations: NFP - Normal foveal profile. CME - cystoid macular edema. PED - pigment epithelial detachment. IRF - intraretinal fluid. SRF - subretinal fluid. EZ - ellipsoid zone. ERM -  epiretinal membrane. ORA - outer retinal atrophy. ORT - outer retinal tubulation. SRHM - subretinal hyper-reflective material. IRHM - intraretinal hyper-reflective material              ASSESSMENT/PLAN:    ICD-10-CM   1. Retinal edema  H35.81 OCT, Retina - OU - Both Eyes    2.  Vitreomacular adhesion of right eye  H43.821     3. Epiretinal membrane (ERM) of right eye  H35.371     4. CME (cystoid macular edema), right  H35.351     5. Pseudophakia, both eyes  Z96.1       1-4. Central retinal edema OD  - central cystic changes -- likely multifactorial  - ERM and VMT possibly contributing  - pt reports history of prolonged inflammation post cataract surgeries and YAGs OU -- [place]  - suspect majority of edema OD driven by CME post YAG cap OD (on 4.18.22)  - OCT shows interval improvement in central edema on PF and Prolensa  - continue PF and Prolensa QID OD -- for CME component  - iOP okay  - f/u in 4 wks -- DFE/OCT, possible FA  5. Pseudophakia OU  - s/p CE/IOL OU (OD - Dr. Marisa Hua, 7.30.2021; OS - Dr. Geoffry Paradise, 6.11.2018)  - IOLs in good position, doing well  - s/p YAG OU (OD 4.18.22) -- may have triggered CME OD  - monitor  Ophthalmic Meds Ordered this visit:  No orders of the defined types were placed in this encounter.     Return in about 4 weeks (around 02/13/2021) for f/u CME OD, DFE, OCT.  There are no Patient Instructions on file for this visit.   Explained the diagnoses, plan, and follow up with the patient and they expressed understanding.  Patient expressed understanding of the importance of proper follow up care.   This document serves as a record of services personally performed by Gardiner Sleeper, MD, PhD. It was created on their behalf by San Jetty. Owens Shark, OA an ophthalmic technician. The creation of this record is the provider's dictation and/or activities during the visit.    Electronically signed by: San Jetty. Owens Shark, [place] 06.06.2022 2:34 AM  Gardiner Sleeper, M.D., Ph.D. Diseases & Surgery of the Retina and Vitreous [facility]  I have reviewed the above documentation for accuracy and completeness, and I agree with the above. Gardiner Sleeper, M.D., Ph.D. 01/18/21 2:34 AM   Abbreviations: M myopia  (nearsighted); A astigmatism; H hyperopia (farsighted); P presbyopia; Mrx spectacle prescription;  CTL contact lenses; OD right eye; OS left eye; OU both eyes  XT exotropia; ET esotropia; PEK punctate epithelial keratitis; PEE punctate epithelial erosions; DES dry eye syndrome; MGD meibomian gland dysfunction; ATs artificial tears; PFAT's preservative free artificial tears; [place] nuclear sclerotic cataract; PSC posterior subcapsular cataract; ERM epi-retinal membrane; PVD posterior vitreous detachment; RD retinal detachment; DM diabetes mellitus; DR diabetic retinopathy; NPDR non-proliferative diabetic retinopathy; PDR proliferative diabetic retinopathy; CSME clinically significant macular edema; DME diabetic macular edema; dbh dot blot hemorrhages; CWS cotton wool spot; POAG primary open angle glaucoma; C/D cup-to-disc ratio; HVF humphrey visual field; GVF goldmann visual field; OCT optical coherence tomography; IOP intraocular pressure; BRVO Branch retinal vein occlusion; CRVO central retinal vein occlusion; CRAO central retinal artery occlusion; BRAO branch retinal artery occlusion; RT retinal tear; SB scleral buckle; PPV pars plana vitrectomy; VH Vitreous hemorrhage; PRP panretinal laser photocoagulation; IVK intravitreal kenalog; VMT vitreomacular traction; MH Macular hole;  NVD neovascularization of  the disc; NVE neovascularization elsewhere; AREDS age related eye disease study; ARMD age related macular degeneration; POAG primary open angle glaucoma; EBMD epithelial/anterior basement membrane dystrophy; ACIOL anterior chamber intraocular lens; IOL intraocular lens; PCIOL posterior chamber intraocular lens; Phaco/IOL phacoemulsification with intraocular lens placement; [place] photorefractive keratectomy; LASIK laser assisted in situ keratomileusis; HTN hypertension; DM diabetes mellitus; COPD chronic obstructive pulmonary disease

## 2021-01-16 ENCOUNTER — Encounter (INDEPENDENT_AMBULATORY_CARE_PROVIDER_SITE_OTHER): Payer: Self-pay | Admitting: Ophthalmology

## 2021-01-16 ENCOUNTER — Ambulatory Visit (INDEPENDENT_AMBULATORY_CARE_PROVIDER_SITE_OTHER): Payer: BC Managed Care – PPO | Admitting: Ophthalmology

## 2021-01-16 ENCOUNTER — Other Ambulatory Visit: Payer: Self-pay

## 2021-01-16 DIAGNOSIS — H35371 Puckering of macula, right eye: Secondary | ICD-10-CM

## 2021-01-16 DIAGNOSIS — H43821 Vitreomacular adhesion, right eye: Secondary | ICD-10-CM

## 2021-01-16 DIAGNOSIS — H35351 Cystoid macular degeneration, right eye: Secondary | ICD-10-CM | POA: Diagnosis not present

## 2021-01-16 DIAGNOSIS — Z961 Presence of intraocular lens: Secondary | ICD-10-CM

## 2021-01-16 DIAGNOSIS — H3581 Retinal edema: Secondary | ICD-10-CM

## 2021-01-18 ENCOUNTER — Encounter (INDEPENDENT_AMBULATORY_CARE_PROVIDER_SITE_OTHER): Payer: Self-pay | Admitting: Ophthalmology

## 2021-02-11 ENCOUNTER — Encounter (HOSPITAL_COMMUNITY): Payer: Self-pay | Admitting: *Deleted

## 2021-02-11 ENCOUNTER — Telehealth (HOSPITAL_COMMUNITY): Payer: Self-pay | Admitting: *Deleted

## 2021-02-11 NOTE — Telephone Encounter (Signed)
Patient called this morning to follow up on Lung screening.  Would like to schedule.

## 2021-02-11 NOTE — Progress Notes (Signed)
[facility] Note  02/13/2021     CHIEF COMPLAINT Patient presents for Retina Follow Up   HISTORY OF PRESENT ILLNESS: Isabel Atkinson is a 55 y.o. female who presents to the clinic today for:   HPI     Retina Follow Up   Patient presents with  Other (Central retinal edema).  In right eye.  This started months ago.  Severity is moderate.  Duration of 4 weeks.  Since onset it is gradually improving.  I, the attending physician,  performed the HPI with the patient and updated documentation appropriately.        Comments   55 y/o female Isabel Atkinson here for 4 wk f/u for central retinal edema OD.  Feels [facility] OD is gradually improving.  No change in [place] OS.  Denies pain, FOL, floaters.  Pred and Prolensa QID OD.      Last edited by Bernarda Caffey, MD on 02/17/2021 12:04 AM.     Isabel Atkinson states vision is the same, she is still using PF and Prolensa QID OD  Referring physician:  Baruch Goldmann, MD [address], [address] [place], [place]  [address]   HISTORICAL INFORMATION:   Selected notes from the MEDICAL RECORD NUMBER Referred by Dr. Marisa Hua LEE: 05.02.22 Ocular Hx- VMT OD, PCIOL OU, YAG OU, Ocular HTN, Steroid Responder BCVA: OD 20/80+, OS 20/20    CURRENT MEDICATIONS: Current Outpatient Medications (Ophthalmic Drugs)  Medication Sig   Bromfenac Sodium (PROLENSA) 0.07 % SOLN Place 1 drop into the right eye 4 (four) times daily.   cycloSPORINE (RESTASIS) 0.05 % ophthalmic emulsion Place 1 drop into both eyes 2 (two) times daily.  (Patient not taking: Reported on 01/16/2021)   prednisoLONE acetate (PRED FORTE) 1 % ophthalmic suspension Place 1 drop into the right eye 4 (four) times daily. (Patient not taking: Reported on 01/16/2021)   No current facility-administered medications for this visit. (Ophthalmic Drugs)   Current Outpatient Medications (Other)  Medication Sig   acetaminophen (TYLENOL) 500 MG tablet Take 500 mg by mouth every 6 (six) hours as needed  for moderate pain or headache.   albuterol (VENTOLIN HFA) 108 (90 Base) MCG/ACT inhaler Inhale 2 puffs into the lungs every 6 (six) hours as needed for wheezing or shortness of breath.   ALPRAZolam (XANAX) 0.25 MG tablet TAKE 1/2 TO 1 TABLET BY MOUTH TWICE DAILY AS NEEDED FOR ANXIETY. (Patient taking differently: Take 0.625 mg by mouth daily as needed for anxiety.)   Ascorbic Acid (VITAMIN C PO) Take 1 tablet by mouth daily. With elderberry   aspirin EC 81 MG tablet Take 81 mg by mouth daily. Swallow whole.   CALCIUM PO Take 750 mg by mouth 2 (two) times daily with a meal.   cholecalciferol (VITAMIN D3) 25 MCG (1000 UNIT) tablet Take 2,000 Units by mouth daily.   ibuprofen (ADVIL) 600 MG tablet Take 1 tablet (600 mg total) by mouth every 6 (six) hours as needed.   Multiple Vitamins-Minerals (MULTIVITAMIN WITH MINERALS) tablet Take 1 tablet by mouth daily.    nystatin (MYCOSTATIN) 100000 UNIT/ML suspension Swish and swallow one teaspoon QID for 7 days   omeprazole (PRILOSEC) 40 MG capsule Take 1 capsule (40 mg total) by mouth daily.   ondansetron (ZOFRAN-ODT) 8 MG disintegrating tablet TAKE 1 TABLET EVERY 8 HOURS AS NEEDED FOR NAUSEA   predniSONE (STERAPRED UNI-PAK 21 TAB) 10 MG (21) TBPK tablet Take by mouth.   rosuvastatin (CRESTOR) 5 MG tablet Take one  tablet po each day   silver sulfADIAZINE (SILVADENE) 1 % cream Apply BID prn (Patient taking differently: Apply 1 application topically 2 (two) times daily as needed (burns).)   traMADol (ULTRAM) 50 MG tablet tramadol 50 mg tablet   Triamcinolone Acetonide (NASACORT ALLERGY 24HR NA) Place 2 sprays into the nose daily.   No current facility-administered medications for this visit. (Other)      REVIEW OF SYSTEMS: ROS   Positive for: Gastrointestinal, Musculoskeletal, Cardiovascular, Eyes Negative for: Constitutional, Neurological, Skin, Genitourinary, HENT, Endocrine, Respiratory, Psychiatric, Allergic/Imm, Heme/Lymph Last edited by Matthew Folks, COA on 02/13/2021  9:39 AM.        ALLERGIES Allergies  Allergen Reactions   Ciprofloxacin Other (See Comments)    Patient states with in five minutes of taking developed a splitting headache,itching all over and burning in mouth and lips.   Augmentin [Amoxicillin-Pot Clavulanate] Diarrhea   Carafate [Sucralfate]     Mouth tingling   Celexa [Citalopram Hydrobromide] Nausea And Vomiting   Chantix [Varenicline] Nausea And Vomiting   Sulfa Antibiotics Itching   Wellbutrin [Bupropion]     Severe weight loss and loss of appetite   Fosamax [Alendronate Sodium] Nausea And Vomiting    GERD can not tolerate    PAST MEDICAL HISTORY Past Medical History:  Diagnosis Date   Anxiety    GERD (gastroesophageal reflux disease)    Hypercholesterolemia    Pre-diabetes    Reactive airway disease    Tachycardia    Past Surgical History:  Procedure Laterality Date   CATARACT EXTRACTION W/PHACO Left 01/17/2017   Procedure: CATARACT EXTRACTION PHACO AND INTRAOCULAR LENS PLACEMENT ([place]);  Surgeon: Tonny Branch, MD;  Location: [facility];  Service: Ophthalmology;  Laterality: Left;  CDE: 6.92   CATARACT EXTRACTION W/PHACO Right 03/07/2020   Procedure: CATARACT EXTRACTION PHACO AND INTRAOCULAR LENS PLACEMENT RIGHT EYE;  Surgeon: Baruch Goldmann, MD;  Location: [facility];  Service: Ophthalmology;  Laterality: Right;  CDE: 8.44   COLONOSCOPY  2013   RMR: Normal rectum. single diminutive sigmoid polyp noted above. Normal terminal ileum. Status post segmental biopsy. next TCS in 10 years.    ESOPHAGOGASTRODUODENOSCOPY  2013   RMR: Possible cervical esophageal web-status post dilation as described above. Small hiatal hernia. Status post biopsy of normal appearing small bowel to screen for celiac disease.    ESOPHAGOGASTRODUODENOSCOPY N/A 05/06/2016   Procedure: ESOPHAGOGASTRODUODENOSCOPY (EGD);  Surgeon: Daneil Dolin, MD;  Location: [facility];  Service: Endoscopy;  Laterality: N/A;  3:00 pm - Isabel Atkinson knows  to arrive at 2:30   EYE SURGERY Bilateral    Cat Sx - [place] N/A 05/06/2016   Procedure: Venia Minks DILATION;  Surgeon: Daneil Dolin, MD;  Location: [facility];  Service: Endoscopy;  Laterality: N/A;   NECK SURGERY  10/2012   OVARIAN CYST SURGERY  1997   removed   TUBAL LIGATION  2542   YAG LASER APPLICATION Bilateral     FAMILY HISTORY Family History  Problem Relation Age of Onset   Colon cancer Maternal Grandfather 89   Colon polyps Mother 89   Colon cancer Maternal Uncle 3   Hypertension Father    Diabetes Father    Heart disease Father    Hyperlipidemia Father    Stroke Father     SOCIAL HISTORY Social History   Tobacco Use   Smoking status: Every Day    Packs/day: 1.00    Years: 25.00    Pack years:  25.00    Types: Cigarettes   Smokeless tobacco: Never  Vaping Use   Vaping Use: Never used  Substance Use Topics   Alcohol use: No    Alcohol/week: 0.0 standard drinks   Drug use: No         OPHTHALMIC EXAM:  Base Eye Exam     Visual Acuity (Snellen - Linear)       Right Left   Dist [place] 20/40 -2 20/20   Dist ph [place] 20/30 -2          Tonometry (Tonopen, 9:42 AM)       Right Left   Pressure 15 16         Pupils       Dark Light Shape React APD   Right 3 2 Round Brisk None   Left 3 2 Round Brisk None         Visual Fields (Counting fingers)       Left Right    Full Full         Extraocular Movement       Right Left    Full, Ortho Full, Ortho         Neuro/Psych     Oriented x3: Yes   Mood/Affect: Normal         Dilation     Both eyes: 1.0% Mydriacyl, 2.5% Phenylephrine @ 9:42 AM           Slit Lamp and Fundus Exam     Slit Lamp Exam       Right Left   Lids/Lashes Dermatochalasis - upper lid, mild MGD Dermatochalasis - upper lid, mild MGD   Conjunctiva/Sclera nasal and temporal pinguecula, Conjunctivochalasis IT nasal and temporal pinguecula, Conjunctivochalasis IT   Cornea well  healed cataract wound Mild tear film debris, well healed cataract wound   Anterior Chamber deep and clear, No cell or flare deep and clear   Iris Round and moderately dilated Round and moderately dilated   Lens MF PC IOL in good position with open PC MF PC IOL in good position with open PC   Vitreous Vitreous syneresis Vitreous syneresis         Fundus Exam       Right Left   Disc Pink and Sharp Pink and Sharp   C/D Ratio 0.3 0.3   Macula Blunted foveal reflex, central edema and cystic changes - persistent, ?slightly increased ERM Flat, Blunted foveal reflex, mild RPE mottling, No heme or edema   Vessels attenuated, Tortuous, mild Copper wiring mild attenuation, mild Copper wiring   Periphery Attached    Attached               IMAGING AND PROCEDURES  Imaging and Procedures for 02/13/2021  OCT, Retina - OU - Both Eyes       Right Eye Quality was good. Central Foveal Thickness: 377. Progression has been stable. Findings include abnormal foveal contour, intraretinal fluid, vitreomacular adhesion , epiretinal membrane, vitreous traction, no SRF (Mild Interval increase in central cystic changes, persistent ERM/VMT).   Left Eye Quality was good. Central Foveal Thickness: 284. Progression has been stable. Findings include normal foveal contour, no IRF, no SRF (Partial PVD).   Notes *Images captured and stored on drive  Diagnosis / Impression:  OD: Mild Interval increase in central cystic changes, persistent ERM/VMT OS: NFP, no IRF/SRF  Clinical management:  See below  Abbreviations: NFP - Normal foveal profile. CME - cystoid macular edema. PED - pigment  epithelial detachment. IRF - intraretinal fluid. SRF - subretinal fluid. EZ - ellipsoid zone. ERM - epiretinal membrane. ORA - outer retinal atrophy. ORT - outer retinal tubulation. SRHM - subretinal hyper-reflective material. IRHM - intraretinal hyper-reflective material      Fluorescein Angiography Optos (Transit OD)        Right Eye Progression has no prior data. Early phase findings include normal observations. Mid/Late phase findings include normal observations, staining (Mild hyperfluorescence of disc, mild late perifoveal staining).   Left Eye Progression has no prior data. Early phase findings include normal observations. Mid/Late phase findings include normal observations.   Notes **Images stored on drive**  Impression: OD: Mild hyperfluorescence of disc, mild late perifoveal staining --?improving CME OS: normal study               ASSESSMENT/PLAN:    ICD-10-CM   1. Retinal edema  H35.81 OCT, Retina - OU - Both Eyes    2. Vitreomacular adhesion of right eye  H43.821     3. Epiretinal membrane (ERM) of right eye  H35.371     4. CME (cystoid macular edema), right  H35.351     5. Pseudophakia, both eyes  Z96.1     6. Hypertensive retinopathy of both eyes  H35.033 Fluorescein Angiography Optos (Transit OD)      1-4. Central retinal edema OD  - central cystic changes -- likely multifactorial  - ERM and VMT possibly contributing  - Isabel Atkinson reports history of prolonged inflammation post cataract surgeries and YAGs OU -- [place]  - suspect majority of edema OD driven by CME post YAG cap OD (on 4.18.22)  - OCT shows mild interval increase in central edema on PF and Prolensa  - FA (07.08.22) shows mild, late perifoveal staining; ?mild hyperfluorescence of disc  - continue PF and Prolensa QID OD -- for CME component  - IOP okay at 15  - f/u in 4 wks -- DFE/OCT  5. Pseudophakia OU  - s/p CE/IOL OU (OD - Dr. Marisa Hua, 7.30.2021; OS - Dr. Geoffry Paradise, 6.11.2018)  - IOLs in good position, doing well  - s/p YAG OU (OD 4.18.22) -- may have triggered CME OD  - monitor  Ophthalmic Meds Ordered this visit:  No orders of the defined types were placed in this encounter.     Return in about 4 weeks (around 03/13/2021) for f/u retinal edema OD, DFE, OCT.  There are no Patient Instructions on file  for this visit.   Explained the diagnoses, plan, and follow up with the patient and they expressed understanding.  Patient expressed understanding of the importance of proper follow up care.   This document serves as a record of services personally performed by Gardiner Sleeper, MD, PhD. It was created on their behalf by San Jetty. Owens Shark, OA an ophthalmic technician. The creation of this record is the provider's dictation and/or activities during the visit.    Electronically signed by: San Jetty. Owens Shark, [place] 07.06.2022 12:06 AM  Gardiner Sleeper, M.D., Ph.D. Diseases & Surgery of the Retina and Vitreous [facility]  I have reviewed the above documentation for accuracy and completeness, and I agree with the above. Gardiner Sleeper, M.D., Ph.D. 02/17/21 12:07 AM   Abbreviations: M myopia (nearsighted); A astigmatism; H hyperopia (farsighted); P presbyopia; Mrx spectacle prescription;  CTL contact lenses; OD right eye; OS left eye; OU both eyes  XT exotropia; ET esotropia; PEK punctate epithelial keratitis; PEE punctate epithelial erosions; DES dry  eye syndrome; MGD meibomian gland dysfunction; ATs artificial tears; PFAT's preservative free artificial tears; [place] nuclear sclerotic cataract; PSC posterior subcapsular cataract; ERM epi-retinal membrane; PVD posterior vitreous detachment; RD retinal detachment; DM diabetes mellitus; DR diabetic retinopathy; NPDR non-proliferative diabetic retinopathy; PDR proliferative diabetic retinopathy; CSME clinically significant macular edema; DME diabetic macular edema; dbh dot blot hemorrhages; CWS cotton wool spot; POAG primary open angle glaucoma; C/D cup-to-disc ratio; HVF humphrey visual field; GVF goldmann visual field; OCT optical coherence tomography; IOP intraocular pressure; BRVO Branch retinal vein occlusion; CRVO central retinal vein occlusion; CRAO central retinal artery occlusion; BRAO branch retinal artery occlusion; RT retinal tear;  SB scleral buckle; PPV pars plana vitrectomy; VH Vitreous hemorrhage; PRP panretinal laser photocoagulation; IVK intravitreal kenalog; VMT vitreomacular traction; MH Macular hole;  NVD neovascularization of the disc; NVE neovascularization elsewhere; AREDS age related eye disease study; ARMD age related macular degeneration; POAG primary open angle glaucoma; EBMD epithelial/anterior basement membrane dystrophy; ACIOL anterior chamber intraocular lens; IOL intraocular lens; PCIOL posterior chamber intraocular lens; Phaco/IOL phacoemulsification with intraocular lens placement; [place] photorefractive keratectomy; LASIK laser assisted in situ keratomileusis; HTN hypertension; DM diabetes mellitus; COPD chronic obstructive pulmonary disease

## 2021-02-13 ENCOUNTER — Encounter (INDEPENDENT_AMBULATORY_CARE_PROVIDER_SITE_OTHER): Payer: Self-pay | Admitting: Ophthalmology

## 2021-02-13 ENCOUNTER — Other Ambulatory Visit: Payer: Self-pay

## 2021-02-13 ENCOUNTER — Ambulatory Visit (INDEPENDENT_AMBULATORY_CARE_PROVIDER_SITE_OTHER): Payer: BC Managed Care – PPO | Admitting: Ophthalmology

## 2021-02-13 DIAGNOSIS — H3581 Retinal edema: Secondary | ICD-10-CM

## 2021-02-13 DIAGNOSIS — H35371 Puckering of macula, right eye: Secondary | ICD-10-CM | POA: Diagnosis not present

## 2021-02-13 DIAGNOSIS — Z961 Presence of intraocular lens: Secondary | ICD-10-CM

## 2021-02-13 DIAGNOSIS — H35351 Cystoid macular degeneration, right eye: Secondary | ICD-10-CM | POA: Diagnosis not present

## 2021-02-13 DIAGNOSIS — H43821 Vitreomacular adhesion, right eye: Secondary | ICD-10-CM

## 2021-02-13 DIAGNOSIS — H35033 Hypertensive retinopathy, bilateral: Secondary | ICD-10-CM

## 2021-02-17 ENCOUNTER — Other Ambulatory Visit (HOSPITAL_COMMUNITY): Payer: Self-pay

## 2021-02-17 ENCOUNTER — Encounter (INDEPENDENT_AMBULATORY_CARE_PROVIDER_SITE_OTHER): Payer: Self-pay | Admitting: Ophthalmology

## 2021-02-17 DIAGNOSIS — Z122 Encounter for screening for malignant neoplasm of respiratory organs: Secondary | ICD-10-CM

## 2021-02-17 DIAGNOSIS — Z87891 Personal history of nicotine dependence: Secondary | ICD-10-CM

## 2021-02-17 NOTE — Progress Notes (Signed)
Patient returned call for low-dose CT scan follow-up. Patient scheduled for LDCT on 03/16/2021 at 0800. Patient made aware.

## 2021-03-11 NOTE — Progress Notes (Signed)
[facility] Note  03/13/2021     CHIEF COMPLAINT Patient presents for Retina Follow Up   HISTORY OF PRESENT ILLNESS: Isabel Atkinson is a 55 y.o. female who presents to the clinic today for:   HPI     Retina Follow Up   Patient presents with  Other.  In right eye.  Duration of 4 weeks.  Since onset it is stable.  I, the attending physician,  performed the HPI with the patient and updated documentation appropriately.        Comments   Pt here for 4 wk f/u for retinal edema OD. Pt states she has been to see Dr. Marisa Hua and her pressure in OD was elevated, she was placed on Simbrinza 1gtts TID OD. No changes vision wise, no ocular pain or discomfort. Still taking PF and Prolensa QID OD       Last edited by Bernarda Caffey, MD on 03/16/2021  8:35 AM.    Dr. Marisa Hua took pt off Brimonidine, and put her on Simbrinza TID OD.  Pt still taking PF and Prolensa QID OD.  No change in [place] OU noticed.  Referring physician: Baruch Goldmann, MD [address], [id number] [place], [place]  [phone]  HISTORICAL INFORMATION:   Selected notes from the MEDICAL RECORD NUMBER Referred by Dr. Marisa Hua LEE: 05.02.22 Ocular Hx- VMT OD, PCIOL OU, YAG OU, Ocular HTN, Steroid Responder BCVA: OD 20/80+, OS 20/20   CURRENT MEDICATIONS: Current Outpatient Medications (Ophthalmic Drugs)  Medication Sig   Bromfenac Sodium (PROLENSA) 0.07 % SOLN Place 1 drop into the right eye 4 (four) times daily.   cycloSPORINE (RESTASIS) 0.05 % ophthalmic emulsion Place 1 drop into both eyes 2 (two) times daily.  (Patient not taking: Reported on 01/16/2021)   prednisoLONE acetate (PRED FORTE) 1 % ophthalmic suspension Place 1 drop into the right eye 4 (four) times daily.   No current facility-administered medications for this visit. (Ophthalmic Drugs)   Current Outpatient Medications (Other)  Medication Sig   acetaminophen (TYLENOL) 500 MG tablet Take 500 mg by mouth every 6 (six) hours as  needed for moderate pain or headache.   albuterol (VENTOLIN HFA) 108 (90 Base) MCG/ACT inhaler Inhale 2 puffs into the lungs every 6 (six) hours as needed for wheezing or shortness of breath.   ALPRAZolam (XANAX) 0.25 MG tablet TAKE 1/2 TO 1 TABLET BY MOUTH TWICE DAILY AS NEEDED FOR ANXIETY. (Patient taking differently: Take 0.625 mg by mouth daily as needed for anxiety.)   Ascorbic Acid (VITAMIN C PO) Take 1 tablet by mouth daily. With elderberry   aspirin EC 81 MG tablet Take 81 mg by mouth daily. Swallow whole.   CALCIUM PO Take 750 mg by mouth 2 (two) times daily with a meal.   cholecalciferol (VITAMIN D3) 25 MCG (1000 UNIT) tablet Take 2,000 Units by mouth daily.   ibuprofen (ADVIL) 600 MG tablet Take 1 tablet (600 mg total) by mouth every 6 (six) hours as needed.   Multiple Vitamins-Minerals (MULTIVITAMIN WITH MINERALS) tablet Take 1 tablet by mouth daily.    nystatin (MYCOSTATIN) 100000 UNIT/ML suspension Swish and swallow one teaspoon QID for 7 days   omeprazole (PRILOSEC) 40 MG capsule Take 1 capsule (40 mg total) by mouth daily.   ondansetron (ZOFRAN-ODT) 8 MG disintegrating tablet TAKE 1 TABLET EVERY 8 HOURS AS NEEDED FOR NAUSEA   predniSONE (STERAPRED UNI-PAK 21 TAB) 10 MG (21) TBPK tablet Take by mouth.   rosuvastatin (CRESTOR)  5 MG tablet Take one tablet po each day   silver sulfADIAZINE (SILVADENE) 1 % cream Apply BID prn (Patient taking differently: Apply 1 application topically 2 (two) times daily as needed (burns).)   traMADol (ULTRAM) 50 MG tablet tramadol 50 mg tablet   Triamcinolone Acetonide (NASACORT ALLERGY 24HR NA) Place 2 sprays into the nose daily.   No current facility-administered medications for this visit. (Other)      REVIEW OF SYSTEMS: ROS   Positive for: Gastrointestinal, Musculoskeletal, Cardiovascular, Eyes Negative for: Constitutional, Neurological, Skin, Genitourinary, HENT, Endocrine, Respiratory, Psychiatric, Allergic/Imm, Heme/Lymph Last edited by  Kingsley Spittle, COT on 03/13/2021  9:21 AM.         ALLERGIES Allergies  Allergen Reactions   Ciprofloxacin Other (See Comments)    Patient states with in five minutes of taking developed a splitting headache,itching all over and burning in mouth and lips.   Augmentin [Amoxicillin-Pot Clavulanate] Diarrhea   Carafate [Sucralfate]     Mouth tingling   Celexa [Citalopram Hydrobromide] Nausea And Vomiting   Chantix [Varenicline] Nausea And Vomiting   Sulfa Antibiotics Itching   Wellbutrin [Bupropion]     Severe weight loss and loss of appetite   Fosamax [Alendronate Sodium] Nausea And Vomiting    GERD can not tolerate    PAST MEDICAL HISTORY Past Medical History:  Diagnosis Date   Anxiety    GERD (gastroesophageal reflux disease)    Hypercholesterolemia    Pre-diabetes    Reactive airway disease    Tachycardia    Past Surgical History:  Procedure Laterality Date   CATARACT EXTRACTION W/PHACO Left 01/17/2017   Procedure: CATARACT EXTRACTION PHACO AND INTRAOCULAR LENS PLACEMENT ([place]);  Surgeon: Tonny Branch, MD;  Location: [facility];  Service: Ophthalmology;  Laterality: Left;  CDE: 6.92   CATARACT EXTRACTION W/PHACO Right 03/07/2020   Procedure: CATARACT EXTRACTION PHACO AND INTRAOCULAR LENS PLACEMENT RIGHT EYE;  Surgeon: Baruch Goldmann, MD;  Location: [facility];  Service: Ophthalmology;  Laterality: Right;  CDE: 8.44   COLONOSCOPY  2013   RMR: Normal rectum. single diminutive sigmoid polyp noted above. Normal terminal ileum. Status post segmental biopsy. next TCS in 10 years.    ESOPHAGOGASTRODUODENOSCOPY  2013   RMR: Possible cervical esophageal web-status post dilation as described above. Small hiatal hernia. Status post biopsy of normal appearing small bowel to screen for celiac disease.    ESOPHAGOGASTRODUODENOSCOPY N/A 05/06/2016   Procedure: ESOPHAGOGASTRODUODENOSCOPY (EGD);  Surgeon: Daneil Dolin, MD;  Location: [facility];  Service: Endoscopy;  Laterality: N/A;  3:00  pm - pt knows to arrive at 2:30   EYE SURGERY Bilateral    Cat Sx - [place] N/A 05/06/2016   Procedure: Venia Minks DILATION;  Surgeon: Daneil Dolin, MD;  Location: [facility];  Service: Endoscopy;  Laterality: N/A;   NECK SURGERY  10/2012   OVARIAN CYST SURGERY  1997   removed   TUBAL LIGATION  [id number]   YAG LASER APPLICATION Bilateral     FAMILY HISTORY Family History  Problem Relation Age of Onset   Colon cancer Maternal Grandfather 34   Colon polyps Mother 66   Colon cancer Maternal Uncle 70   Hypertension Father    Diabetes Father    Heart disease Father    Hyperlipidemia Father    Stroke Father     SOCIAL HISTORY Social History   Tobacco Use   Smoking status: Every Day    Packs/day: 1.00    Years:  25.00    Pack years: 25.00    Types: Cigarettes   Smokeless tobacco: Never  Vaping Use   Vaping Use: Never used  Substance Use Topics   Alcohol use: No    Alcohol/week: 0.0 standard drinks   Drug use: No         OPHTHALMIC EXAM:  Base Eye Exam     Visual Acuity (Snellen - Linear)       Right Left   Dist [place] 20/30 -1 20/20 -2   Dist ph [place] NI          Tonometry (Tonopen, 9:26 AM)       Right Left   Pressure 17 22         Pupils       Dark Light Shape React APD   Right 3 2 Round Brisk None   Left 3 2 Round Brisk None         Visual Fields (Counting fingers)       Left Right    Full          Extraocular Movement       Right Left    Full Full         Neuro/Psych     Oriented x3: Yes   Mood/Affect: Normal         Dilation     Both eyes: 1.0% Mydriacyl, 2.5% Phenylephrine @ 9:26 AM           Slit Lamp and Fundus Exam     Slit Lamp Exam       Right Left   Lids/Lashes Dermatochalasis - upper lid, mild MGD Dermatochalasis - upper lid, mild MGD   Conjunctiva/Sclera nasal and temporal pinguecula, Conjunctivochalasis IT nasal and temporal pinguecula, Conjunctivochalasis IT   Cornea well healed  cataract wound, trace PEE Mild tear film debris, well healed cataract wound, 1+ inferior PEE   Anterior Chamber deep and clear, No cell or flare deep and clear   Iris Round and moderately dilated Round and moderately dilated   Lens MF PC IOL in good position with open PC MF PC IOL in good position with open PC   Vitreous Vitreous syneresis Vitreous syneresis         Fundus Exam       Right Left   Disc Pink and Sharp Pink and Sharp   C/D Ratio 0.3 0.3   Macula Blunted foveal reflex, central edema and cystic changes - improved, +ERM Flat, Blunted foveal reflex, mild RPE mottling, No heme or edema   Vessels attenuated, Tortuous, mild Copper wiring mild attenuation, mild Copper wiring   Periphery Attached Attached            IMAGING AND PROCEDURES  Imaging and Procedures for 03/13/2021  OCT, Retina - OU - Both Eyes       Right Eye Quality was good. Central Foveal Thickness: 362. Progression has improved. Findings include abnormal foveal contour, intraretinal fluid, vitreomacular adhesion , epiretinal membrane, vitreous traction, no SRF (Mild Interval improvement in central cystic changes, persistent ERM/VMT).   Left Eye Quality was good. Central Foveal Thickness: 288. Progression has been stable. Findings include normal foveal contour, no IRF, no SRF (Partial PVD).   Notes *Images captured and stored on drive  Diagnosis / Impression:  OD: Mild Interval improvement in central cystic changes, persistent ERM/VMT OS: NFP, no IRF/SRF  Clinical management:  See below  Abbreviations: NFP - Normal foveal profile. CME - cystoid macular edema. PED - pigment  epithelial detachment. IRF - intraretinal fluid. SRF - subretinal fluid. EZ - ellipsoid zone. ERM - epiretinal membrane. ORA - outer retinal atrophy. ORT - outer retinal tubulation. SRHM - subretinal hyper-reflective material. IRHM - intraretinal hyper-reflective material            ASSESSMENT/PLAN:    ICD-10-CM   1.  Retinal edema  H35.81 OCT, Retina - OU - Both Eyes    2. Vitreomacular adhesion of right eye  H43.821     3. Epiretinal membrane (ERM) of right eye  H35.371     4. CME (cystoid macular edema), right  H35.351     5. Pseudophakia, both eyes  Z96.1     6. Hypertensive retinopathy of both eyes  H35.033      1-4. Central retinal edema OD  - central cystic changes -- likely multifactorial  - ERM and VMT possibly contributing  - pt reports history of prolonged inflammation post cataract surgeries and YAGs OU -- [place]  - suspect portion of edema OD driven by CME post YAG cap OD (on 4.18.22)  - OCT shows mild interval improvement in central edema / cyst on PF and Prolensa  - FA (07.08.22) shows mild, late perifoveal staining; ?mild hyperfluorescence of disc  - continue PF and Prolensa QID OD -- for CME component  - IOP okay at 17 -- on Simbrinza TID OD, started by Dr. Marisa Hua  - f/u in 4 wks -- DFE/OCT  5. Pseudophakia OU  - s/p CE/IOL OU (OD - Dr. Marisa Hua, 7.30.2021; OS - Dr. Geoffry Paradise, 6.11.2018)  - IOLs in good position, doing well  - s/p YAG OU (OD 4.18.22) -- may have triggered CME OD  - monitor  Ophthalmic Meds Ordered this visit:  Meds ordered this encounter  Medications   prednisoLONE acetate (PRED FORTE) 1 % ophthalmic suspension    Sig: Place 1 drop into the right eye 4 (four) times daily.    Dispense:  15 mL    Refill:  0   Bromfenac Sodium (PROLENSA) 0.07 % SOLN    Sig: Place 1 drop into the right eye 4 (four) times daily.    Dispense:  3 mL    Refill:  3       Return in about 4 weeks (around 04/10/2021) for 4 wk f/u for central retinal edema OD w/DFE&OCT.  There are no Patient Instructions on file for this visit.  Explained the diagnoses, plan, and follow up with the patient and they expressed understanding.  Patient expressed understanding of the importance of proper follow up care.   This document serves as a record of services personally performed by Gardiner Sleeper, MD, PhD. It was created on their behalf by San Jetty. Owens Shark, OA an ophthalmic technician. The creation of this record is the provider's dictation and/or activities during the visit.    Electronically signed by: San Jetty. Owens Shark, [place] 08.03.2022 8:36 AM  This document serves as a record of services personally performed by Gardiner Sleeper, MD, PhD. It was created on their behalf by Estill Bakes, COT an ophthalmic technician. The creation of this record is the provider's dictation and/or activities during the visit.    Electronically signed by: Estill Bakes, COT 8.5.22 @ 8:36 AM   Gardiner Sleeper, M.D., Ph.D. Diseases & Surgery of the Retina and Vitreous [facility] 8.5.22  I have reviewed the above documentation for accuracy and completeness, and I agree with the above. Gardiner Sleeper, M.D., Ph.D.  03/16/21 8:38 AM  Abbreviations: M myopia (nearsighted); A astigmatism; H hyperopia (farsighted); P presbyopia; Mrx spectacle prescription;  CTL contact lenses; OD right eye; OS left eye; OU both eyes  XT exotropia; ET esotropia; PEK punctate epithelial keratitis; PEE punctate epithelial erosions; DES dry eye syndrome; MGD meibomian gland dysfunction; ATs artificial tears; PFAT's preservative free artificial tears; [place] nuclear sclerotic cataract; PSC posterior subcapsular cataract; ERM epi-retinal membrane; PVD posterior vitreous detachment; RD retinal detachment; DM diabetes mellitus; DR diabetic retinopathy; NPDR non-proliferative diabetic retinopathy; PDR proliferative diabetic retinopathy; CSME clinically significant macular edema; DME diabetic macular edema; dbh dot blot hemorrhages; CWS cotton wool spot; POAG primary open angle glaucoma; C/D cup-to-disc ratio; HVF humphrey visual field; GVF goldmann visual field; OCT optical coherence tomography; IOP intraocular pressure; BRVO Branch retinal vein occlusion; CRVO central retinal vein occlusion; CRAO central retinal artery  occlusion; BRAO branch retinal artery occlusion; RT retinal tear; SB scleral buckle; PPV pars plana vitrectomy; VH Vitreous hemorrhage; PRP panretinal laser photocoagulation; IVK intravitreal kenalog; VMT vitreomacular traction; MH Macular hole;  NVD neovascularization of the disc; NVE neovascularization elsewhere; AREDS age related eye disease study; ARMD age related macular degeneration; POAG primary open angle glaucoma; EBMD epithelial/anterior basement membrane dystrophy; ACIOL anterior chamber intraocular lens; IOL intraocular lens; PCIOL posterior chamber intraocular lens; Phaco/IOL phacoemulsification with intraocular lens placement; [place] photorefractive keratectomy; LASIK laser assisted in situ keratomileusis; HTN hypertension; DM diabetes mellitus; COPD chronic obstructive pulmonary disease

## 2021-03-13 ENCOUNTER — Encounter (INDEPENDENT_AMBULATORY_CARE_PROVIDER_SITE_OTHER): Payer: Self-pay | Admitting: Ophthalmology

## 2021-03-13 ENCOUNTER — Ambulatory Visit (INDEPENDENT_AMBULATORY_CARE_PROVIDER_SITE_OTHER): Payer: BC Managed Care – PPO | Admitting: Ophthalmology

## 2021-03-13 ENCOUNTER — Other Ambulatory Visit: Payer: Self-pay

## 2021-03-13 DIAGNOSIS — H35371 Puckering of macula, right eye: Secondary | ICD-10-CM | POA: Diagnosis not present

## 2021-03-13 DIAGNOSIS — H43821 Vitreomacular adhesion, right eye: Secondary | ICD-10-CM | POA: Diagnosis not present

## 2021-03-13 DIAGNOSIS — H3581 Retinal edema: Secondary | ICD-10-CM | POA: Diagnosis not present

## 2021-03-13 DIAGNOSIS — H35351 Cystoid macular degeneration, right eye: Secondary | ICD-10-CM

## 2021-03-13 DIAGNOSIS — Z961 Presence of intraocular lens: Secondary | ICD-10-CM

## 2021-03-13 DIAGNOSIS — H35033 Hypertensive retinopathy, bilateral: Secondary | ICD-10-CM

## 2021-03-13 MED ORDER — PREDNISOLONE ACETATE 1 % OP SUSP: 1.0000 [drp] | Freq: Four times a day (QID) | OPHTHALMIC | 0 refills | Status: DC

## 2021-03-13 MED ORDER — PROLENSA 0.07 % OP SOLN
1.0000 [drp] | Freq: Four times a day (QID) | OPHTHALMIC | 3 refills | Status: DC
Start: 2021-03-13 — End: 2021-05-18

## 2021-03-16 ENCOUNTER — Ambulatory Visit (HOSPITAL_COMMUNITY)
Admission: RE | Admit: 2021-03-16 | Discharge: 2021-03-16 | Disposition: A | Discharge status: Home / Self Care | Payer: BC Managed Care – PPO | Source: Ambulatory Visit | Attending: Physician Assistant | Admitting: Physician Assistant

## 2021-03-16 ENCOUNTER — Other Ambulatory Visit: Payer: Self-pay

## 2021-03-16 ENCOUNTER — Encounter (INDEPENDENT_AMBULATORY_CARE_PROVIDER_SITE_OTHER): Payer: Self-pay | Admitting: Ophthalmology

## 2021-03-16 DIAGNOSIS — Z87891 Personal history of nicotine dependence: Secondary | ICD-10-CM

## 2021-03-16 DIAGNOSIS — Z122 Encounter for screening for malignant neoplasm of respiratory organs: Secondary | ICD-10-CM | POA: Insufficient documentation

## 2021-03-20 ENCOUNTER — Ambulatory Visit
Admission: RE | Admit: 2021-03-20 | Discharge: 2021-03-20 | Disposition: A | Discharge status: Home / Self Care | Payer: BC Managed Care – PPO | Source: Ambulatory Visit | Attending: Emergency Medicine | Admitting: Emergency Medicine

## 2021-03-20 ENCOUNTER — Other Ambulatory Visit: Payer: Self-pay

## 2021-03-20 VITALS — BP 120/74 | HR 59 | Temp 98.6°F | Resp 18

## 2021-03-20 DIAGNOSIS — R300 Dysuria: Secondary | ICD-10-CM | POA: Diagnosis present

## 2021-03-20 LAB — POCT URINALYSIS DIP (MANUAL ENTRY)
Bilirubin, UA: NEGATIVE
Glucose, UA: NEGATIVE mg/dL
Ketones, POC UA: NEGATIVE mg/dL
Leukocytes, UA: NEGATIVE
Nitrite, UA: NEGATIVE
Protein Ur, POC: NEGATIVE mg/dL
Spec Grav, UA: >=1.03 — AB (ref 1.010–1.025)
Urobilinogen, UA: 0.2 E.U./dL
pH, UA: 5.5 (ref 5.0–8.0)

## 2021-03-20 MED ORDER — NITROFURANTOIN MONOHYD MACRO 100 MG PO CAPS: 100.0000 mg | ORAL_CAPSULE | Freq: Two times a day (BID) | ORAL | 0 refills | Status: DC

## 2021-03-20 NOTE — ED Triage Notes (Signed)
Pt presents with c/o dysuria for past week

## 2021-03-20 NOTE — ED Provider Notes (Signed)
[facility]   CC: Burning with urination  SUBJECTIVE:  Isabel Atkinson is a 55 y.o. female who complains of dysuria x 1 week.  Patient denies a precipitating event, recent sexual encounter, excessive caffeine intake.  Denies abdominal and flank pain.  Has tried OTC medications without relief.  Symptoms are made worse with urination.  Admits to similar symptoms in the past.  Denies fever, chills, nausea, vomiting, abdominal pain, flank pain, abnormal vaginal discharge or bleeding, hematuria.    LMP: No LMP recorded. Patient is postmenopausal.  ROS: As in HPI.  All other pertinent ROS negative.     Past Medical History:  Diagnosis Date   Anxiety    GERD (gastroesophageal reflux disease)    Hypercholesterolemia    Pre-diabetes    Reactive airway disease    Tachycardia    Past Surgical History:  Procedure Laterality Date   CATARACT EXTRACTION W/PHACO Left 01/17/2017   Procedure: CATARACT EXTRACTION PHACO AND INTRAOCULAR LENS PLACEMENT ([place]);  Surgeon: Tonny Branch, MD;  Location: [facility];  Service: Ophthalmology;  Laterality: Left;  CDE: 6.92   CATARACT EXTRACTION W/PHACO Right 03/07/2020   Procedure: CATARACT EXTRACTION PHACO AND INTRAOCULAR LENS PLACEMENT RIGHT EYE;  Surgeon: Baruch Goldmann, MD;  Location: [facility];  Service: Ophthalmology;  Laterality: Right;  CDE: 8.44   COLONOSCOPY  2013   RMR: Normal rectum. single diminutive sigmoid polyp noted above. Normal terminal ileum. Status post segmental biopsy. next TCS in 10 years.    ESOPHAGOGASTRODUODENOSCOPY  2013   RMR: Possible cervical esophageal web-status post dilation as described above. Small hiatal hernia. Status post biopsy of normal appearing small bowel to screen for celiac disease.    ESOPHAGOGASTRODUODENOSCOPY N/A 05/06/2016   Procedure: ESOPHAGOGASTRODUODENOSCOPY (EGD);  Surgeon: Daneil Dolin, MD;  [facility];  Service: Endoscopy;  Laterality: N/A;  3:00 pm - pt knows to arrive at 2:30   EYE  SURGERY Bilateral    Cat Sx - [place] N/A 05/06/2016   Procedure: Venia Minks DILATION;  Surgeon: Daneil Dolin, MD;  [facility];  Service: Endoscopy;  Laterality: N/A;   NECK SURGERY  10/2012   OVARIAN CYST SURGERY  1997   removed   TUBAL LIGATION  [id number]   YAG LASER APPLICATION Bilateral    Allergies  Allergen Reactions   Ciprofloxacin Other (See Comments)    Patient states with in five minutes of taking developed a splitting headache,itching all over and burning in mouth and lips.   Augmentin [Amoxicillin-Pot Clavulanate] Diarrhea   Carafate [Sucralfate]     Mouth tingling   Celexa [Citalopram Hydrobromide] Nausea And Vomiting   Chantix [Varenicline] Nausea And Vomiting   Sulfa Antibiotics Itching   Wellbutrin [Bupropion]     Severe weight loss and loss of appetite   Fosamax [Alendronate Sodium] Nausea And Vomiting    GERD can not tolerate   No current facility-administered medications on file prior to encounter.   Current Outpatient Medications on File Prior to Encounter  Medication Sig Dispense Refill   acetaminophen (TYLENOL) 500 MG tablet Take 500 mg by mouth every 6 (six) hours as needed for moderate pain or headache.     albuterol (VENTOLIN HFA) 108 (90 Base) MCG/ACT inhaler Inhale 2 puffs into the lungs every 6 (six) hours as needed for wheezing or shortness of breath. 8 g 0   ALPRAZolam (XANAX) 0.25 MG tablet TAKE 1/2 TO 1 TABLET BY MOUTH TWICE DAILY AS NEEDED FOR ANXIETY. (  Patient taking differently: Take 0.625 mg by mouth daily as needed for anxiety.) 30 tablet 0   Ascorbic Acid (VITAMIN C PO) Take 1 tablet by mouth daily. With elderberry     aspirin EC 81 MG tablet Take 81 mg by mouth daily. Swallow whole.     Bromfenac Sodium (PROLENSA) 0.07 % SOLN Place 1 drop into the right eye 4 (four) times daily. 3 mL 3   CALCIUM PO Take 750 mg by mouth 2 (two) times daily with a meal.     cholecalciferol (VITAMIN D3) 25 MCG (1000 UNIT) tablet  Take 2,000 Units by mouth daily.     cycloSPORINE (RESTASIS) 0.05 % ophthalmic emulsion Place 1 drop into both eyes 2 (two) times daily.  (Patient not taking: Reported on 01/16/2021)     ibuprofen (ADVIL) 600 MG tablet Take 1 tablet (600 mg total) by mouth every 6 (six) hours as needed. 30 tablet 0   Multiple Vitamins-Minerals (MULTIVITAMIN WITH MINERALS) tablet Take 1 tablet by mouth daily.      nystatin (MYCOSTATIN) 100000 UNIT/ML suspension Swish and swallow one teaspoon QID for 7 days 473 mL 0   omeprazole (PRILOSEC) 40 MG capsule Take 1 capsule (40 mg total) by mouth daily. 30 capsule 2   ondansetron (ZOFRAN-ODT) 8 MG disintegrating tablet TAKE 1 TABLET EVERY 8 HOURS AS NEEDED FOR NAUSEA 18 tablet 2   prednisoLONE acetate (PRED FORTE) 1 % ophthalmic suspension Place 1 drop into the right eye 4 (four) times daily. 15 mL 0   predniSONE (STERAPRED UNI-PAK 21 TAB) 10 MG (21) TBPK tablet Take by mouth.     rosuvastatin (CRESTOR) 5 MG tablet Take one tablet po each day 30 tablet 5   silver sulfADIAZINE (SILVADENE) 1 % cream Apply BID prn (Patient taking differently: Apply 1 application topically 2 (two) times daily as needed (burns).) 50 g 6   traMADol (ULTRAM) 50 MG tablet tramadol 50 mg tablet     Triamcinolone Acetonide (NASACORT ALLERGY 24HR NA) Place 2 sprays into the nose daily.     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    Employer: [employer]  Tobacco Use   Smoking status: Every Day    Packs/day: 1.00    Years: 25.00    Pack years: 25.00    Types: Cigarettes   Smokeless tobacco: Never  Vaping Use   Vaping Use: Never used  Substance and Sexual Activity   Alcohol use: No    Alcohol/week: 0.0 standard drinks   Drug use: No   Sexual activity: Yes    Birth control/protection:  Post-menopausal, Surgical  Other Topics Concern   Not on file  Social History Narrative   Lives at home w/ her husband   2 children - ages 65 & 93   Right-sided   Caffeine: 2 beverages per day   Social Determinants of Health   Financial Resource Strain: Not on file  Food Insecurity: Not on file  Transportation Needs: Not on file  Physical Activity: Not on file  Stress: Not on file  Social Connections: Not on file  Intimate Partner Violence: Not on file   Family History  Problem Relation Age of Onset   Colon cancer Maternal Grandfather 21   Colon polyps Mother 59  Colon cancer Maternal Uncle 15   Hypertension Father    Diabetes Father    Heart disease Father    Hyperlipidemia Father    Stroke Father     OBJECTIVE:  Vitals:   03/20/21 1639  BP: 120/74  Pulse: (!) 59  Resp: 18  Temp: 98.6 F (37 C)  SpO2: 98%   General appearance: AOx3 in no acute distress HEENT: NCAT.  Oropharynx clear.  Lungs: clear to auscultation bilaterally without adventitious breath sounds Heart: regular rate and rhythm.   Abdomen: soft; non-distended; no tenderness; bowel sounds present; no guarding or rebound tenderness Back: no CVA tenderness Extremities: no edema; symmetrical with no gross deformities Skin: warm and dry Neurologic: Ambulates from chair to exam table without difficulty Psychological: alert and cooperative; normal mood and affect  Labs Reviewed  POCT URINALYSIS DIP (MANUAL ENTRY) - Abnormal; Notable for the following components:      Result Value   Spec Grav, UA >=1.030 (*)    Blood, UA small (*)    All other components within normal limits  URINE CULTURE    ASSESSMENT & PLAN:  1. Dysuria     Meds ordered this encounter  Medications   nitrofurantoin, macrocrystal-monohydrate, (MACROBID) 100 MG capsule    Sig: Take 1 capsule (100 mg total) by mouth 2 (two) times daily.    Dispense:  10 capsule    Refill:  0    Order Specific Question:   Supervising  Provider    Answer:   Raylene Everts [id number]   Urine concerning for infection Urine culture sent.  We will call you with the results.   Push fluids and get plenty of rest.   Take antibiotic as directed and to completion Follow up with PCP if symptoms persists Return here or go to ER if you have any new or worsening symptoms such as fever, worsening abdominal pain, nausea/vomiting, flank pain, etc...  Outlined signs and symptoms indicating need for more acute intervention. Patient verbalized understanding. After Visit Summary given.      Lestine Box, PA-C 03/20/21 1733

## 2021-03-20 NOTE — Discharge Instructions (Addendum)
Urine concerning for infection Urine culture sent.  We will call you with the results.   Push fluids and get plenty of rest.   Take antibiotic as directed and to completion Follow up with PCP if symptoms persists Return here or go to ER if you have any new or worsening symptoms such as fever, worsening abdominal pain, nausea/vomiting, flank pain, etc... 

## 2021-03-22 LAB — URINE CULTURE: Culture: <10000 — AB

## 2021-04-09 NOTE — Progress Notes (Signed)
[facility] Note  04/10/2021     CHIEF COMPLAINT Patient presents for Retina Follow Up   HISTORY OF PRESENT ILLNESS: Isabel Atkinson is a 55 y.o. female who presents to the clinic today for:   HPI     Retina Follow Up   Patient presents with  Other.  In right eye.  This started 4 weeks ago.  I, the attending physician,  performed the HPI with the patient and updated documentation appropriately.        Comments   Patient here for 4 weeks retina follow up for central retinal edema OD. Patient states vision about the same. The pressure in OD still up.  Was put on 4 different drops. Didn't bring and doesn't remember the names of them.      Last edited by Bernarda Caffey, MD on 04/10/2021  8:27 PM.    Pt states her IOP was 30 in Dr. Bernarda Caffey office last week, he started her on Vyzulta to use at night, she is also using PF and Prolensa QID OD and Simbrinza BID   Referring physician: Baruch Goldmann, MD [address], [id number] [place], [place]  [address]  HISTORICAL INFORMATION:   Selected notes from the MEDICAL RECORD NUMBER Referred by Dr. Marisa Hua LEE: 05.02.22 Ocular Hx- VMT OD, PCIOL OU, YAG OU, Ocular HTN, Steroid Responder BCVA: OD 20/80+, OS 20/20   CURRENT MEDICATIONS: Current Outpatient Medications (Ophthalmic Drugs)  Medication Sig   Bromfenac Sodium (PROLENSA) 0.07 % SOLN Place 1 drop into the right eye 4 (four) times daily.   cycloSPORINE (RESTASIS) 0.05 % ophthalmic emulsion Place 1 drop into both eyes 2 (two) times daily.  (Patient not taking: Reported on 01/16/2021)   prednisoLONE acetate (PRED FORTE) 1 % ophthalmic suspension Place 1 drop into the right eye 4 (four) times daily.   No current facility-administered medications for this visit. (Ophthalmic Drugs)   Current Outpatient Medications (Other)  Medication Sig   acetaminophen (TYLENOL) 500 MG tablet Take 500 mg by mouth every 6 (six) hours as needed for moderate pain or  headache.   albuterol (VENTOLIN HFA) 108 (90 Base) MCG/ACT inhaler Inhale 2 puffs into the lungs every 6 (six) hours as needed for wheezing or shortness of breath.   ALPRAZolam (XANAX) 0.25 MG tablet TAKE 1/2 TO 1 TABLET BY MOUTH TWICE DAILY AS NEEDED FOR ANXIETY. (Patient taking differently: Take 0.625 mg by mouth daily as needed for anxiety.)   Ascorbic Acid (VITAMIN C PO) Take 1 tablet by mouth daily. With elderberry   aspirin EC 81 MG tablet Take 81 mg by mouth daily. Swallow whole.   CALCIUM PO Take 750 mg by mouth 2 (two) times daily with a meal.   cholecalciferol (VITAMIN D3) 25 MCG (1000 UNIT) tablet Take 2,000 Units by mouth daily.   ibuprofen (ADVIL) 600 MG tablet Take 1 tablet (600 mg total) by mouth every 6 (six) hours as needed.   Multiple Vitamins-Minerals (MULTIVITAMIN WITH MINERALS) tablet Take 1 tablet by mouth daily.    nitrofurantoin, macrocrystal-monohydrate, (MACROBID) 100 MG capsule Take 1 capsule (100 mg total) by mouth 2 (two) times daily.   nystatin (MYCOSTATIN) 100000 UNIT/ML suspension Swish and swallow one teaspoon QID for 7 days   omeprazole (PRILOSEC) 40 MG capsule Take 1 capsule (40 mg total) by mouth daily.   ondansetron (ZOFRAN-ODT) 8 MG disintegrating tablet TAKE 1 TABLET EVERY 8 HOURS AS NEEDED FOR NAUSEA   predniSONE (STERAPRED UNI-PAK 21 TAB) 10 MG (  21) TBPK tablet Take by mouth.   rosuvastatin (CRESTOR) 5 MG tablet Take one tablet po each day   silver sulfADIAZINE (SILVADENE) 1 % cream Apply BID prn (Patient taking differently: Apply 1 application topically 2 (two) times daily as needed (burns).)   traMADol (ULTRAM) 50 MG tablet tramadol 50 mg tablet   Triamcinolone Acetonide (NASACORT ALLERGY 24HR NA) Place 2 sprays into the nose daily.   No current facility-administered medications for this visit. (Other)      REVIEW OF SYSTEMS: ROS   Positive for: Gastrointestinal, Musculoskeletal, Cardiovascular, Eyes Negative for: Constitutional, Neurological,  Skin, Genitourinary, HENT, Endocrine, Respiratory, Psychiatric, Allergic/Imm, Heme/Lymph Last edited by Theodore Demark, COA on 04/10/2021  1:35 PM.          ALLERGIES Allergies  Allergen Reactions   Ciprofloxacin Other (See Comments)    Patient states with in five minutes of taking developed a splitting headache,itching all over and burning in mouth and lips.   Augmentin [Amoxicillin-Pot Clavulanate] Diarrhea   Carafate [Sucralfate]     Mouth tingling   Celexa [Citalopram Hydrobromide] Nausea And Vomiting   Chantix [Varenicline] Nausea And Vomiting   Sulfa Antibiotics Itching   Wellbutrin [Bupropion]     Severe weight loss and loss of appetite   Fosamax [Alendronate Sodium] Nausea And Vomiting    GERD can not tolerate    PAST MEDICAL HISTORY Past Medical History:  Diagnosis Date   Anxiety    GERD (gastroesophageal reflux disease)    Hypercholesterolemia    Pre-diabetes    Reactive airway disease    Tachycardia    Past Surgical History:  Procedure Laterality Date   CATARACT EXTRACTION W/PHACO Left 01/17/2017   Procedure: CATARACT EXTRACTION PHACO AND INTRAOCULAR LENS PLACEMENT ([place]);  Surgeon: Tonny Branch, MD;  Location: [facility];  Service: Ophthalmology;  Laterality: Left;  CDE: 6.92   CATARACT EXTRACTION W/PHACO Right 03/07/2020   Procedure: CATARACT EXTRACTION PHACO AND INTRAOCULAR LENS PLACEMENT RIGHT EYE;  Surgeon: Baruch Goldmann, MD;  Location: [facility];  Service: Ophthalmology;  Laterality: Right;  CDE: 8.44   COLONOSCOPY  2013   RMR: Normal rectum. single diminutive sigmoid polyp noted above. Normal terminal ileum. Status post segmental biopsy. next TCS in 10 years.    ESOPHAGOGASTRODUODENOSCOPY  2013   RMR: Possible cervical esophageal web-status post dilation as described above. Small hiatal hernia. Status post biopsy of normal appearing small bowel to screen for celiac disease.    ESOPHAGOGASTRODUODENOSCOPY N/A 05/06/2016   Procedure: ESOPHAGOGASTRODUODENOSCOPY  (EGD);  Surgeon: Daneil Dolin, MD;  Location: [facility];  Service: Endoscopy;  Laterality: N/A;  3:00 pm - pt knows to arrive at 2:30   EYE SURGERY Bilateral    Cat Sx - [place] N/A 05/06/2016   Procedure: Venia Minks DILATION;  Surgeon: Daneil Dolin, MD;  Location: [facility];  Service: Endoscopy;  Laterality: N/A;   NECK SURGERY  10/2012   OVARIAN CYST SURGERY  1997   removed   TUBAL LIGATION  [id number]   YAG LASER APPLICATION Bilateral     FAMILY HISTORY Family History  Problem Relation Age of Onset   Colon cancer Maternal Grandfather 24   Colon polyps Mother 75   Colon cancer Maternal Uncle 29   Hypertension Father    Diabetes Father    Heart disease Father    Hyperlipidemia Father    Stroke Father     SOCIAL HISTORY Social History   Tobacco Use   Smoking status:  Every Day    Packs/day: 1.00    Years: 25.00    Pack years: 25.00    Types: Cigarettes   Smokeless tobacco: Never  Vaping Use   Vaping Use: Never used  Substance Use Topics   Alcohol use: No    Alcohol/week: 0.0 standard drinks   Drug use: No         OPHTHALMIC EXAM:  Base Eye Exam     Visual Acuity (Snellen - Linear)       Right Left   Dist [place] 20/30 -2 20/20   Dist ph [place] NI          Tonometry (Tonopen, 1:31 PM)       Right Left   Pressure 28 20  Brimonidine and Cosopt given at 1:28 pm per Dr Coralyn Pear        Pupils       Dark Light Shape React APD   Right 3 2 Round Brisk None   Left 3 2 Round Brisk None         Visual Fields (Counting fingers)       Left Right    Full Full         Extraocular Movement       Right Left    Full Full         Neuro/Psych     Oriented x3: Yes   Mood/Affect: Normal         Dilation     Both eyes: 1.0% Mydriacyl, 2.5% Phenylephrine @ 1:30 PM           Slit Lamp and Fundus Exam     Slit Lamp Exam       Right Left   Lids/Lashes Dermatochalasis - upper lid, mild MGD Dermatochalasis - upper  lid, mild MGD   Conjunctiva/Sclera nasal and temporal pinguecula, Conjunctivochalasis IT nasal and temporal pinguecula, Conjunctivochalasis IT   Cornea well healed cataract wound Mild tear film debris, well healed cataract wound, 1+ inferior PEE   Anterior Chamber deep and clear, No cell or flare deep and clear   Iris Round and moderately dilated Round and moderately dilated   Lens MF PC IOL in good position with open PC MF PC IOL in good position with open PC   Vitreous Vitreous syneresis Vitreous syneresis         Fundus Exam       Right Left   Disc Pink and Sharp Pink and Sharp   C/D Ratio 0.3 0.3   Macula Blunted foveal reflex, central cyst decreased, +ERM Flat, Blunted foveal reflex, mild RPE mottling, No heme or edema   Vessels attenuated, Tortuous, mild Copper wiring mild attenuation, mild Copper wiring   Periphery Attached Attached            IMAGING AND PROCEDURES  Imaging and Procedures for 04/10/2021  OCT, Retina - OU - Both Eyes       Right Eye Quality was good. Central Foveal Thickness: 351. Progression has improved. Findings include abnormal foveal contour, intraretinal fluid, vitreomacular adhesion , epiretinal membrane, vitreous traction, no SRF (Mild Interval improvement in central cystic changes, persistent ERM/VMT).   Left Eye Quality was good. Central Foveal Thickness: 289. Progression has been stable. Findings include normal foveal contour, no IRF, no SRF (Partial PVD).   Notes *Images captured and stored on drive  Diagnosis / Impression:  OD: Mild Interval improvement in central cystic changes, persistent ERM/VMT OS: NFP, no IRF/SRF  Clinical management:  See below  Abbreviations: NFP - Normal foveal profile. CME - cystoid macular edema. PED - pigment epithelial detachment. IRF - intraretinal fluid. SRF - subretinal fluid. EZ - ellipsoid zone. ERM - epiretinal membrane. ORA - outer retinal atrophy. ORT - outer retinal tubulation. SRHM - subretinal  hyper-reflective material. IRHM - intraretinal hyper-reflective material            ASSESSMENT/PLAN:    ICD-10-CM   1. Retinal edema  H35.81 OCT, Retina - OU - Both Eyes    2. Vitreomacular adhesion of right eye  H43.821     3. Epiretinal membrane (ERM) of right eye  H35.371     4. CME (cystoid macular edema), right  H35.351     5. Pseudophakia, both eyes  Z96.1     6. Hypertensive retinopathy of both eyes  H35.033       1-4. Central retinal edema OD  - central cystic changes -- likely multifactorial  - ERM and VMT possibly contributing  - pt reports history of prolonged inflammation post cataract surgeries and YAGs OU -- [place]  - suspect portion of edema OD driven by CME post YAG cap OD (on 4.18.22)  - OCT shows mild interval improvement in central edema / cyst on PF and Prolensa  - FA (07.08.22) shows mild, late perifoveal staining; ?mild hyperfluorescence of disc  - decrease PF to BID OD continue Prolensa QID OD -- for CME component  - IOP high today OD at 28/26-- on Simbrinza TID OD and Vyzulta qhs, managed by Dr. Marisa Hua  - f/u in 2 wks -- DFE/OCT  5. Pseudophakia OU  - s/p CE/IOL OU (OD - Dr. Marisa Hua, 7.30.2021; OS - Dr. Geoffry Paradise, 6.11.2018)  - IOLs in good position, doing well  - s/p YAG OU (OD 4.18.22)  - monitor   Ophthalmic Meds Ordered this visit:  No orders of the defined types were placed in this encounter.      Return in 2 weeks (on 04/24/2021) for f/u retinal edema OD, DFE, OCT.  There are no Patient Instructions on file for this visit.  Explained the diagnoses, plan, and follow up with the patient and they expressed understanding.  Patient expressed understanding of the importance of proper follow up care.   This document serves as a record of services personally performed by Gardiner Sleeper, MD, PhD. It was created on their behalf by Leonie Douglas, an ophthalmic technician. The creation of this record is the provider's dictation and/or activities  during the visit.    Electronically signed by: Leonie Douglas COA, 04/10/21  8:37 PM  This document serves as a record of services personally performed by Gardiner Sleeper, MD, PhD. It was created on their behalf by San Jetty. Owens Shark, OA an ophthalmic technician. The creation of this record is the provider's dictation and/or activities during the visit.    Electronically signed by: San Jetty. Marguerita Merles 09.02.2022 8:37 PM  Gardiner Sleeper, M.D., Ph.D. Diseases & Surgery of the Retina and [place] 04/10/2021  I have reviewed the above documentation for accuracy and completeness, and I agree with the above. Gardiner Sleeper, M.D., Ph.D. 04/10/21 8:37 PM   Abbreviations: M myopia (nearsighted); A astigmatism; H hyperopia (farsighted); P presbyopia; Mrx spectacle prescription;  CTL contact lenses; OD right eye; OS left eye; OU both eyes  XT exotropia; ET esotropia; PEK punctate epithelial keratitis; PEE punctate epithelial erosions; DES dry eye syndrome; MGD meibomian gland dysfunction; ATs artificial tears; PFAT's preservative free  artificial tears; Watson nuclear sclerotic cataract; PSC posterior subcapsular cataract; ERM epi-retinal membrane; PVD posterior vitreous detachment; RD retinal detachment; DM diabetes mellitus; DR diabetic retinopathy; NPDR non-proliferative diabetic retinopathy; PDR proliferative diabetic retinopathy; CSME clinically significant macular edema; DME diabetic macular edema; dbh dot blot hemorrhages; CWS cotton wool spot; POAG primary open angle glaucoma; C/D cup-to-disc ratio; HVF humphrey visual field; GVF goldmann visual field; OCT optical coherence tomography; IOP intraocular pressure; BRVO Branch retinal vein occlusion; CRVO central retinal vein occlusion; CRAO central retinal artery occlusion; BRAO branch retinal artery occlusion; RT retinal tear; SB scleral buckle; PPV pars plana vitrectomy; VH Vitreous hemorrhage; PRP panretinal laser  photocoagulation; IVK intravitreal kenalog; VMT vitreomacular traction; MH Macular hole;  NVD neovascularization of the disc; NVE neovascularization elsewhere; AREDS age related eye disease study; ARMD age related macular degeneration; POAG primary open angle glaucoma; EBMD epithelial/anterior basement membrane dystrophy; ACIOL anterior chamber intraocular lens; IOL intraocular lens; PCIOL posterior chamber intraocular lens; Phaco/IOL phacoemulsification with intraocular lens placement; [place] photorefractive keratectomy; LASIK laser assisted in situ keratomileusis; HTN hypertension; DM diabetes mellitus; COPD chronic obstructive pulmonary disease

## 2021-04-10 ENCOUNTER — Encounter (INDEPENDENT_AMBULATORY_CARE_PROVIDER_SITE_OTHER): Payer: Self-pay | Admitting: Ophthalmology

## 2021-04-10 ENCOUNTER — Encounter (INDEPENDENT_AMBULATORY_CARE_PROVIDER_SITE_OTHER): Payer: BC Managed Care – PPO | Admitting: Ophthalmology

## 2021-04-10 ENCOUNTER — Other Ambulatory Visit: Payer: Self-pay

## 2021-04-10 ENCOUNTER — Ambulatory Visit (INDEPENDENT_AMBULATORY_CARE_PROVIDER_SITE_OTHER): Payer: BC Managed Care – PPO | Admitting: Ophthalmology

## 2021-04-10 DIAGNOSIS — H35371 Puckering of macula, right eye: Secondary | ICD-10-CM | POA: Diagnosis not present

## 2021-04-10 DIAGNOSIS — H35033 Hypertensive retinopathy, bilateral: Secondary | ICD-10-CM

## 2021-04-10 DIAGNOSIS — H43821 Vitreomacular adhesion, right eye: Secondary | ICD-10-CM | POA: Diagnosis not present

## 2021-04-10 DIAGNOSIS — H3581 Retinal edema: Secondary | ICD-10-CM | POA: Diagnosis not present

## 2021-04-10 DIAGNOSIS — Z961 Presence of intraocular lens: Secondary | ICD-10-CM

## 2021-04-10 DIAGNOSIS — H35351 Cystoid macular degeneration, right eye: Secondary | ICD-10-CM | POA: Diagnosis not present

## 2021-04-17 LAB — VITAMIN D 25 HYDROXY (VIT D DEFICIENCY, FRACTURES): Vit D, 25-Hydroxy: 34.9 ng/mL (ref 30.0–100.0)

## 2021-04-17 LAB — COMPREHENSIVE METABOLIC PANEL
ALT: 25 IU/L (ref 0–32)
AST: 25 IU/L (ref 0–40)
Albumin/Globulin Ratio: 2.5 — ABNORMAL HIGH (ref 1.2–2.2)
Albumin: 4.8 g/dL (ref 3.8–4.9)
Alkaline Phosphatase: 62 IU/L (ref 44–121)
BUN/Creatinine Ratio: 22 (ref 9–23)
BUN: 11 mg/dL (ref 6–24)
Bilirubin Total: 0.2 mg/dL (ref 0.0–1.2)
CO2: 27 mmol/L (ref 20–29)
Calcium: 9.9 mg/dL (ref 8.7–10.2)
Chloride: 105 mmol/L (ref 96–106)
Creatinine, Ser: 0.5 mg/dL — ABNORMAL LOW (ref 0.57–1.00)
Globulin, Total: 1.9 g/dL (ref 1.5–4.5)
Glucose: 86 mg/dL (ref 65–99)
Potassium: 3.5 mmol/L (ref 3.5–5.2)
Sodium: 145 mmol/L — ABNORMAL HIGH (ref 134–144)
Total Protein: 6.7 g/dL (ref 6.0–8.5)
eGFR: 110 mL/min/{1.73_m2} (ref >59)

## 2021-04-28 ENCOUNTER — Ambulatory Visit (INDEPENDENT_AMBULATORY_CARE_PROVIDER_SITE_OTHER): Payer: BC Managed Care – PPO | Admitting: Ophthalmology

## 2021-04-28 ENCOUNTER — Other Ambulatory Visit: Payer: Self-pay

## 2021-04-28 ENCOUNTER — Encounter (INDEPENDENT_AMBULATORY_CARE_PROVIDER_SITE_OTHER): Payer: Self-pay | Admitting: Ophthalmology

## 2021-04-28 DIAGNOSIS — H35371 Puckering of macula, right eye: Secondary | ICD-10-CM | POA: Diagnosis not present

## 2021-04-28 DIAGNOSIS — H35351 Cystoid macular degeneration, right eye: Secondary | ICD-10-CM

## 2021-04-28 DIAGNOSIS — H3581 Retinal edema: Secondary | ICD-10-CM

## 2021-04-28 DIAGNOSIS — Z961 Presence of intraocular lens: Secondary | ICD-10-CM

## 2021-04-28 DIAGNOSIS — H43821 Vitreomacular adhesion, right eye: Secondary | ICD-10-CM | POA: Diagnosis not present

## 2021-04-28 NOTE — Progress Notes (Signed)
[facility] Note  04/28/2021     CHIEF COMPLAINT Patient presents for Retina Follow Up   HISTORY OF PRESENT ILLNESS: Isabel Atkinson is a 55 y.o. female who presents to the clinic today for:   HPI     Retina Follow Up   Patient presents with  Other.  In right eye.  Severity is mild.  Duration of 3 weeks.  Since onset it is stable.  I, the attending physician,  performed the HPI with the patient and updated documentation appropriately.        Comments   Pt here for 3 wk ret f/u for ret edema OD. Pt states she feels at times vision is better but not always. No major changes, no ocular pain or discomfort. She does report taking the PF and Prolensa gtts in conjunction with the [place] and Vyzulta.       Last edited by Bernarda Caffey, MD on 04/29/2021 12:17 AM.     Referring physician: Baruch Goldmann, MD [address] [place], [place]  [address]  HISTORICAL INFORMATION:   Selected notes from the MEDICAL RECORD NUMBER Referred by Dr. Marisa Hua LEE: 05.02.22 Ocular Hx- VMT OD, PCIOL OU, YAG OU, Ocular HTN, Steroid Responder BCVA: OD 20/80+, OS 20/20   CURRENT MEDICATIONS: Current Outpatient Medications (Ophthalmic Drugs)  Medication Sig   Bromfenac Sodium (PROLENSA) 0.07 % SOLN Place 1 drop into the right eye 4 (four) times daily.   cycloSPORINE (RESTASIS) 0.05 % ophthalmic emulsion Place 1 drop into both eyes 2 (two) times daily.  (Patient not taking: Reported on 01/16/2021)   prednisoLONE acetate (PRED FORTE) 1 % ophthalmic suspension Place 1 drop into the right eye 4 (four) times daily.   No current facility-administered medications for this visit. (Ophthalmic Drugs)   Current Outpatient Medications (Other)  Medication Sig   acetaminophen (TYLENOL) 500 MG tablet Take 500 mg by mouth every 6 (six) hours as needed for moderate pain or headache.   albuterol (VENTOLIN HFA) 108 (90 Base) MCG/ACT inhaler Inhale 2 puffs into the lungs  every 6 (six) hours as needed for wheezing or shortness of breath.   ALPRAZolam (XANAX) 0.25 MG tablet TAKE 1/2 TO 1 TABLET BY MOUTH TWICE DAILY AS NEEDED FOR ANXIETY. (Patient taking differently: Take 0.625 mg by mouth daily as needed for anxiety.)   Ascorbic Acid (VITAMIN C PO) Take 1 tablet by mouth daily. With elderberry   aspirin EC 81 MG tablet Take 81 mg by mouth daily. Swallow whole.   CALCIUM PO Take 750 mg by mouth 2 (two) times daily with a meal.   cholecalciferol (VITAMIN D3) 25 MCG (1000 UNIT) tablet Take 2,000 Units by mouth daily.   ibuprofen (ADVIL) 600 MG tablet Take 1 tablet (600 mg total) by mouth every 6 (six) hours as needed.   Multiple Vitamins-Minerals (MULTIVITAMIN WITH MINERALS) tablet Take 1 tablet by mouth daily.    nitrofurantoin, macrocrystal-monohydrate, (MACROBID) 100 MG capsule Take 1 capsule (100 mg total) by mouth 2 (two) times daily.   nystatin (MYCOSTATIN) 100000 UNIT/ML suspension Swish and swallow one teaspoon QID for 7 days   omeprazole (PRILOSEC) 40 MG capsule Take 1 capsule (40 mg total) by mouth daily.   ondansetron (ZOFRAN-ODT) 8 MG disintegrating tablet TAKE 1 TABLET EVERY 8 HOURS AS NEEDED FOR NAUSEA   predniSONE (STERAPRED UNI-PAK 21 TAB) 10 MG (21) TBPK tablet Take by mouth.   rosuvastatin (CRESTOR) 5 MG tablet Take one tablet po each day  silver sulfADIAZINE (SILVADENE) 1 % cream Apply BID prn (Patient taking differently: Apply 1 application topically 2 (two) times daily as needed (burns).)   traMADol (ULTRAM) 50 MG tablet tramadol 50 mg tablet   Triamcinolone Acetonide (NASACORT ALLERGY 24HR NA) Place 2 sprays into the nose daily.   No current facility-administered medications for this visit. (Other)    REVIEW OF SYSTEMS: ROS   Positive for: Gastrointestinal, Musculoskeletal, Cardiovascular, Eyes Negative for: Constitutional, Neurological, Skin, Genitourinary, HENT, Endocrine, Respiratory, Psychiatric, Allergic/Imm, Heme/Lymph Last edited by  Kingsley Spittle, COT on 04/28/2021  2:47 PM.      ALLERGIES Allergies  Allergen Reactions   Ciprofloxacin Other (See Comments)    Patient states with in five minutes of taking developed a splitting headache,itching all over and burning in mouth and lips.   Augmentin [Amoxicillin-Pot Clavulanate] Diarrhea   Carafate [Sucralfate]     Mouth tingling   Celexa [Citalopram Hydrobromide] Nausea And Vomiting   Chantix [Varenicline] Nausea And Vomiting   Sulfa Antibiotics Itching   Wellbutrin [Bupropion]     Severe weight loss and loss of appetite   Fosamax [Alendronate Sodium] Nausea And Vomiting    GERD can not tolerate    PAST MEDICAL HISTORY Past Medical History:  Diagnosis Date   Anxiety    GERD (gastroesophageal reflux disease)    Hypercholesterolemia    Pre-diabetes    Reactive airway disease    Tachycardia    Past Surgical History:  Procedure Laterality Date   CATARACT EXTRACTION W/PHACO Left 01/17/2017   Procedure: CATARACT EXTRACTION PHACO AND INTRAOCULAR LENS PLACEMENT ([place]);  Surgeon: Tonny Branch, MD;  Location: [facility];  Service: Ophthalmology;  Laterality: Left;  CDE: 6.92   CATARACT EXTRACTION W/PHACO Right 03/07/2020   Procedure: CATARACT EXTRACTION PHACO AND INTRAOCULAR LENS PLACEMENT RIGHT EYE;  Surgeon: Baruch Goldmann, MD;  Location: [facility];  Service: Ophthalmology;  Laterality: Right;  CDE: 8.44   COLONOSCOPY  2013   RMR: Normal rectum. single diminutive sigmoid polyp noted above. Normal terminal ileum. Status post segmental biopsy. next TCS in 10 years.    ESOPHAGOGASTRODUODENOSCOPY  2013   RMR: Possible cervical esophageal web-status post dilation as described above. Small hiatal hernia. Status post biopsy of normal appearing small bowel to screen for celiac disease.    ESOPHAGOGASTRODUODENOSCOPY N/A 05/06/2016   Procedure: ESOPHAGOGASTRODUODENOSCOPY (EGD);  Surgeon: Daneil Dolin, MD;  Location: [facility];  Service: Endoscopy;  Laterality: N/A;  3:00 pm -  pt knows to arrive at 2:30   EYE SURGERY Bilateral    Cat Sx - [place] N/A 05/06/2016   Procedure: Venia Minks DILATION;  Surgeon: Daneil Dolin, MD;  Location: [facility];  Service: Endoscopy;  Laterality: N/A;   NECK SURGERY  10/2012   OVARIAN CYST SURGERY  1997   removed   TUBAL LIGATION  1194   YAG LASER APPLICATION Bilateral     FAMILY HISTORY Family History  Problem Relation Age of Onset   Colon cancer Maternal Grandfather 1   Colon polyps Mother 32   Colon cancer Maternal Uncle 24   Hypertension Father    Diabetes Father    Heart disease Father    Hyperlipidemia Father    Stroke Father     SOCIAL HISTORY Social History   Tobacco Use   Smoking status: Every Day    Packs/day: 1.00    Years: 25.00    Pack years: 25.00    Types: Cigarettes   Smokeless tobacco:  Never  Vaping Use   Vaping Use: Never used  Substance Use Topics   Alcohol use: No    Alcohol/week: 0.0 standard drinks   Drug use: No         OPHTHALMIC EXAM:  Base Eye Exam     Visual Acuity (Snellen - Linear)       Right Left   Dist [place] 20/25 -2 20/20         Tonometry (Tonopen, 2:55 PM)       Right Left   Pressure 21 14         Pupils       Dark Light Shape React APD   Right 3 2 Round Brisk None   Left 3 2 Round Brisk None         Visual Fields (Counting fingers)       Left Right    Full Full         Extraocular Movement       Right Left    Full, Ortho Full, Ortho         Neuro/Psych     Oriented x3: Yes   Mood/Affect: Normal         Dilation     Both eyes: 1.0% Mydriacyl, 2.5% Phenylephrine @ 2:56 PM           Slit Lamp and Fundus Exam     Slit Lamp Exam       Right Left   Lids/Lashes Dermatochalasis - upper lid, mild MGD Dermatochalasis - upper lid, mild MGD   Conjunctiva/Sclera nasal and temporal pinguecula, Conjunctivochalasis IT nasal and temporal pinguecula, Conjunctivochalasis IT   Cornea well healed  cataract wound, 2+ inferior Punctate epithelial erosions Mild tear film debris, well healed cataract wound, 2-3+ inferior PEE   Anterior Chamber deep and clear, No cell or flare, rare pigment deep and clear   Iris Round and moderately dilated Round and moderately dilated   Lens MF PC IOL in good position with open PC MF PC IOL in good position with open PC   Vitreous Vitreous syneresis Vitreous syneresis         Fundus Exam       Right Left   Disc Pink and Sharp Pink and Sharp   C/D Ratio 0.3 0.3   Macula Blunted foveal reflex, central cyst mild, persistent, +ERM Flat, Blunted foveal reflex, mild RPE mottling, No heme or edema   Vessels attenuated, Tortuous, mild Copper wiring mild attenuation, mild Copper wiring   Periphery Attached Attached            IMAGING AND PROCEDURES  Imaging and Procedures for 04/28/2021  OCT, Retina - OU - Both Eyes       Right Eye Quality was good. Central Foveal Thickness: 350. Progression has been stable. Findings include abnormal foveal contour, intraretinal fluid, vitreomacular adhesion , epiretinal membrane, vitreous traction, no SRF (Persistent central cyst, persistent ERM/VMT).   Left Eye Quality was good. Central Foveal Thickness: 289. Progression has been stable. Findings include normal foveal contour, no IRF, no SRF (Partial PVD).   Notes *Images captured and stored on drive  Diagnosis / Impression:  OD: Persistent central cyst, persistent ERM/VMT OS: NFP, no IRF/SRF  Clinical management:  See below  Abbreviations: NFP - Normal foveal profile. CME - cystoid macular edema. PED - pigment epithelial detachment. IRF - intraretinal fluid. SRF - subretinal fluid. EZ - ellipsoid zone. ERM - epiretinal membrane. ORA - outer retinal atrophy. ORT - outer retinal  tubulation. SRHM - subretinal hyper-reflective material. IRHM - intraretinal hyper-reflective material            ASSESSMENT/PLAN:    ICD-10-CM   1. Retinal edema  H35.81  OCT, Retina - OU - Both Eyes    2. Vitreomacular adhesion of right eye  H43.821     3. Epiretinal membrane (ERM) of right eye  H35.371     4. CME (cystoid macular edema), right  H35.351     5. Pseudophakia, both eyes  Z96.1      1-4. Central retinal edema OD  - central cystic changes -- likely multifactorial  - ERM and VMT possibly contributing  - pt reports history of prolonged inflammation post cataract surgeries and YAGs OU -- [place]  - suspect portion of edema OD driven by CME post YAG cap OD (on 4.18.22)  - OCT shows stable improvement in central edema / cyst on PF BID and Prolensa QID OD  - FA (07.08.22) shows mild, late perifoveal staining; ?mild hyperfluorescence of disc  - taper PF -- 1 drops daily x 2 wks then stop  - decrease Prolensa to BID OD -- for CME component  - IOP okay today OD at 21/14-- on Simbrinza TID OD and Vyzulta qhs, managed by Dr. Marisa Hua  - f/u in 2-3 wks -- DFE/OCT  5. Pseudophakia OU  - s/p CE/IOL OU (OD - Dr. Marisa Hua, 7.30.2021; OS - Dr. Geoffry Paradise, 6.11.2018)  - IOLs in good position, doing well  - s/p YAG OU (OD 4.18.22)  - monitor   Ophthalmic Meds Ordered this visit:  No orders of the defined types were placed in this encounter.     Return for f/u 2-3 weeks, central retinal edema OD, DFE, OCT.  There are no Patient Instructions on file for this visit.  Explained the diagnoses, plan, and follow up with the patient and they expressed understanding.  Patient expressed understanding of the importance of proper follow up care.   This document serves as a record of services personally performed by Gardiner Sleeper, MD, PhD. It was created on their behalf by Estill Bakes, COT an ophthalmic technician. The creation of this record is the provider's dictation and/or activities during the visit.    Electronically signed by: Estill Bakes, COT 9.20.22 @ 12:21 AM   This document serves as a record of services personally performed by Gardiner Sleeper,  MD, PhD. It was created on their behalf by [place] Jet[place]. Owens Shark, OA an ophthalmic technician. The creation of this record is the provider's dictation and/or activities during the visit.    Electronically signed by: San Jetty. Owens Shark, [place] 09.20.2022 12:21 AM  Gardiner Sleeper, M.D., Ph.D. Diseases & Surgery of the Retina and Vitreous [facility]  I have reviewed the above documentation for accuracy and completeness, and I agree with the above. Gardiner Sleeper, M.D., Ph.D. 04/29/21 12:25 AM  Abbreviations: M myopia (nearsighted); A astigmatism; H hyperopia (farsighted); P presbyopia; Mrx spectacle prescription;  CTL contact lenses; OD right eye; OS left eye; OU both eyes  XT exotropia; ET esotropia; PEK punctate epithelial keratitis; PEE punctate epithelial erosions; DES dry eye syndrome; MGD meibomian gland dysfunction; ATs artificial tears; PFAT's preservative free artificial tears; [place] nuclear sclerotic cataract; PSC posterior subcapsular cataract; ERM epi-retinal membrane; PVD posterior vitreous detachment; RD retinal detachment; DM diabetes mellitus; DR diabetic retinopathy; NPDR non-proliferative diabetic retinopathy; PDR proliferative diabetic retinopathy; CSME clinically significant macular edema; DME diabetic macular edema; dbh dot blot  hemorrhages; CWS cotton wool spot; POAG primary open angle glaucoma; C/D cup-to-disc ratio; HVF humphrey visual field; GVF goldmann visual field; OCT optical coherence tomography; IOP intraocular pressure; BRVO Branch retinal vein occlusion; CRVO central retinal vein occlusion; CRAO central retinal artery occlusion; BRAO branch retinal artery occlusion; RT retinal tear; SB scleral buckle; PPV pars plana vitrectomy; VH Vitreous hemorrhage; PRP panretinal laser photocoagulation; IVK intravitreal kenalog; VMT vitreomacular traction; MH Macular hole;  NVD neovascularization of the disc; NVE neovascularization elsewhere; AREDS age related eye disease study;  ARMD age related macular degeneration; POAG primary open angle glaucoma; EBMD epithelial/anterior basement membrane dystrophy; ACIOL anterior chamber intraocular lens; IOL intraocular lens; PCIOL posterior chamber intraocular lens; Phaco/IOL phacoemulsification with intraocular lens placement; [place] photorefractive keratectomy; LASIK laser assisted in situ keratomileusis; HTN hypertension; DM diabetes mellitus; COPD chronic obstructive pulmonary disease

## 2021-04-29 ENCOUNTER — Encounter (INDEPENDENT_AMBULATORY_CARE_PROVIDER_SITE_OTHER): Payer: Self-pay | Admitting: Ophthalmology

## 2021-05-01 ENCOUNTER — Encounter: Payer: Self-pay | Admitting: Nurse Practitioner

## 2021-05-01 ENCOUNTER — Other Ambulatory Visit: Payer: Self-pay

## 2021-05-01 ENCOUNTER — Ambulatory Visit: Payer: BC Managed Care – PPO | Admitting: Nurse Practitioner

## 2021-05-01 VITALS — BP 130/78 | HR 77 | Temp 97.2°F | Ht 62.0 in | Wt 103.8 lb

## 2021-05-01 DIAGNOSIS — R1030 Lower abdominal pain, unspecified: Secondary | ICD-10-CM

## 2021-05-01 DIAGNOSIS — N760 Acute vaginitis: Secondary | ICD-10-CM | POA: Diagnosis not present

## 2021-05-01 DIAGNOSIS — R102 Pelvic and perineal pain: Secondary | ICD-10-CM | POA: Diagnosis not present

## 2021-05-01 DIAGNOSIS — B9689 Other specified bacterial agents as the cause of diseases classified elsewhere: Secondary | ICD-10-CM | POA: Diagnosis not present

## 2021-05-01 DIAGNOSIS — M545 Low back pain, unspecified: Secondary | ICD-10-CM

## 2021-05-01 DIAGNOSIS — R3915 Urgency of urination: Secondary | ICD-10-CM

## 2021-05-01 LAB — POCT URINALYSIS DIP (MANUAL ENTRY)
Bilirubin, UA: NEGATIVE
Glucose, UA: NEGATIVE mg/dL
Ketones, POC UA: NEGATIVE mg/dL
Leukocytes, UA: NEGATIVE
Nitrite, UA: NEGATIVE
Protein Ur, POC: NEGATIVE mg/dL
Spec Grav, UA: 1.01 (ref 1.010–1.025)
Urobilinogen, UA: 0.2 E.U./dL
pH, UA: 7 (ref 5.0–8.0)

## 2021-05-01 MED ORDER — METRONIDAZOLE 500 MG PO TABS: 500.0000 mg | ORAL_TABLET | Freq: Two times a day (BID) | ORAL | 0 refills | Status: DC

## 2021-05-01 NOTE — Patient Instructions (Signed)
Use Luvena or Replens on a regular basis for vaginal dryness Use KY jelly during sex

## 2021-05-01 NOTE — Progress Notes (Signed)
Subjective:    Patient ID: Isabel Atkinson, female    DOB: [birth year], 55 y.o.   MRN: [id number]  HPI abdominal pain Lower x 1 month - vaginal irritation and back pain Symptoms began around 03/20/2021, patient was seen in the local ED for dysuria.  Complaints of irritation in the private area.  Some generalized pelvic discomfort and low back pain more on the left low back area.  Does not radiate.  States she has been lifting some heavy items recently which she thinks may have contributed to this.  Denies any pain during intercourse but will have increased pain and irritation the day after.  Has had no menstrual cycle since age 36.  No fevers.  Occasional yellowish discharge, no odor.  Same sexual partner, no new partners.  Occasional urinary frequency, no actual burning with urination but describes a spasm at the end of urination.  No incontinence.  Has had a tubal ligation for birth control.  No history of recent UTI.  Has a history of renal stones.  No diarrhea or constipation.  Stools normal color.  No nausea or vomiting.  Taking fluids well.       Objective:   Physical Exam NAD.  Alert, oriented.  Mildly anxious affect.  Lungs clear.  No CVA tenderness.  Heart regular rate and rhythm.  Abdomen soft nondistended with mild generalized lower abdominal tenderness more so towards the mid pelvic area.  Distinct localized tenderness noted in the low left lateral sacroiliac area.  Gets on and off table with minimal difficulty.  External GU minimal irritation.  Vulvar area no rash or lesions.  Vagina slightly pale, no discharge.  Minimal tenderness with cervical motion.  Bimanual exam no obvious masses but tenderness more towards the left pelvic area. Today's Vitals   05/01/21 0859  BP: 130/78  Pulse: 77  Temp: (!) 97.2 F (36.2 C)  SpO2: 99%  Weight: 103 lb 12.8 oz (47.1 kg)  Height: 5\' 2"  (1.575 m)   Body mass index is 18.99 kg/m. Results for orders placed or performed in visit on 05/01/21   POCT urinalysis dipstick  Result Value Ref Range   Color, UA yellow yellow   Clarity, UA clear clear   Glucose, UA negative negative mg/dL   Bilirubin, UA negative negative   Ketones, POC UA negative negative mg/dL   Spec Grav, UA 1.010 1.010 - 1.025   Blood, UA small (A) negative   pH, UA 7.0 5.0 - 8.0   Protein Ur, POC negative negative mg/dL   Urobilinogen, UA 0.2 0.2 or 1.0 E.U./dL   Nitrite, UA Negative Negative   Leukocytes, UA Negative Negative  POCT Wet Prep with KOH  Result Value Ref Range   Trichomonas, UA Negative    Clue Cells Wet Prep HPF POC neg    Epithelial Wet Prep HPF POC Few Few, Moderate, Many, Too numerous to count   Yeast Wet Prep HPF POC neg    Bacteria Wet Prep HPF POC None (A) Few   RBC Wet Prep HPF POC neg    WBC Wet Prep HPF POC neg    KOH Prep POC Negative Negative   pH, Wet Prep 5.5   POCT UA - Microscopic Only  Result Value Ref Range   WBC, Ur, HPF, POC neg 0 - 5   RBC, Urine, Miroscopic neg 0 - 2   Bacteria, U Microscopic neg None - Trace   Mucus, UA     Epithelial cells, urine per micros  rare    Crystals, Ur, HPF, POC     Casts, Ur, LPF, POC     Yeast, UA            Assessment & Plan:   Problem List Items Addressed This Visit   None Visit Diagnoses     Pelvic pain    -  Primary   Relevant Orders   US PELVIC COMPLETE WITH TRANSVAGINAL   Lower abdominal pain       Relevant Orders   POCT urinalysis dipstick (Completed)   Chlamydia/Gonococcus/Trichomonas, NAA   Bacterial vaginosis       Relevant Medications   metroNIDAZOLE (FLAGYL) 500 MG tablet   Acute left-sided low back pain without sciatica          Meds ordered this encounter  Medications   metroNIDAZOLE (FLAGYL) 500 MG tablet    Sig: Take 1 tablet (500 mg total) by mouth 2 (two) times daily with a meal.    Dispense:  14 tablet    Refill:  0    Order Specific Question:   Supervising Provider    Answer:   Sallee Lange A [[id number]]  Although no clue cells were noted,  with the report of yellowish vaginal discharge and abnormal pH will treat for bacterial vaginosis. STI testing ordered. Schedule pelvic ultrasound/transvaginal. A small amount of blood has been noted in her urine the past 2 tests, the last one on 03/20/2021.  Further work-up may be needed depending on results of other testing. Discussed symptomatic care for her back pain. Warning signs reviewed.  Patient to call the office or seek help at local ED if worse. Return if symptoms worsen or fail to improve.

## 2021-05-02 ENCOUNTER — Encounter: Payer: Self-pay | Admitting: Nurse Practitioner

## 2021-05-02 LAB — POCT WET PREP WITH KOH
Clue Cells Wet Prep HPF POC: NEGATIVE
KOH Prep POC: NEGATIVE
RBC Wet Prep HPF POC: NEGATIVE
Trichomonas, UA: NEGATIVE
WBC Wet Prep HPF POC: NEGATIVE
Yeast Wet Prep HPF POC: NEGATIVE
pH, Wet Prep: 5.5

## 2021-05-02 LAB — POCT UA - MICROSCOPIC ONLY
Bacteria, U Microscopic: NEGATIVE
RBC, Urine, Miroscopic: NEGATIVE (ref 0–2)
WBC, Ur, HPF, POC: NEGATIVE (ref 0–5)

## 2021-05-05 ENCOUNTER — Ambulatory Visit: Payer: BC Managed Care – PPO | Admitting: "Endocrinology

## 2021-05-05 ENCOUNTER — Encounter: Payer: Self-pay | Admitting: "Endocrinology

## 2021-05-05 ENCOUNTER — Other Ambulatory Visit: Payer: Self-pay

## 2021-05-05 VITALS — BP 128/64 | HR 72 | Ht 62.0 in | Wt 103.0 lb

## 2021-05-05 DIAGNOSIS — F17200 Nicotine dependence, unspecified, uncomplicated: Secondary | ICD-10-CM

## 2021-05-05 DIAGNOSIS — M818 Other osteoporosis without current pathological fracture: Secondary | ICD-10-CM | POA: Diagnosis not present

## 2021-05-05 NOTE — Progress Notes (Signed)
05/05/2021        Endocrinology follow-up note  Past Medical History:  Diagnosis Date   Anxiety    GERD (gastroesophageal reflux disease)    Hypercholesterolemia    Pre-diabetes    Reactive airway disease    Tachycardia    Past Surgical History:  Procedure Laterality Date   CATARACT EXTRACTION W/PHACO Left 01/17/2017   Procedure: CATARACT EXTRACTION PHACO AND INTRAOCULAR LENS PLACEMENT ([place]);  Surgeon: Tonny Branch, MD;  Location: [facility];  Service: Ophthalmology;  Laterality: Left;  CDE: 6.92   CATARACT EXTRACTION W/PHACO Right 03/07/2020   Procedure: CATARACT EXTRACTION PHACO AND INTRAOCULAR LENS PLACEMENT RIGHT EYE;  Surgeon: Baruch Goldmann, MD;  Location: [facility];  Service: Ophthalmology;  Laterality: Right;  CDE: 8.44   COLONOSCOPY  2013   RMR: Normal rectum. single diminutive sigmoid polyp noted above. Normal terminal ileum. Status post segmental biopsy. next TCS in 10 years.    ESOPHAGOGASTRODUODENOSCOPY  2013   RMR: Possible cervical esophageal web-status post dilation as described above. Small hiatal hernia. Status post biopsy of normal appearing small bowel to screen for celiac disease.    ESOPHAGOGASTRODUODENOSCOPY N/A 05/06/2016   Procedure: ESOPHAGOGASTRODUODENOSCOPY (EGD);  Surgeon: Daneil Dolin, MD;  Location: [facility];  Service: Endoscopy;  Laterality: N/A;  3:00 pm - pt knows to arrive at 2:30   EYE SURGERY Bilateral    Cat Sx - [place] N/A 05/06/2016   Procedure: Venia Minks DILATION;  Surgeon: Daneil Dolin, MD;  Location: [facility];  Service: Endoscopy;  Laterality: N/A;   NECK SURGERY  10/2012   OVARIAN CYST SURGERY  1997   removed   TUBAL LIGATION  4268   YAG LASER APPLICATION Bilateral    Social History   Socioeconomic History   Marital status: Married    Spouse name: Not on file   Number of children: 2   Years of education: GED   Highest education level: Not  on file  Occupational History   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    Employer: [employer]  Tobacco Use   Smoking status: Every Day    Packs/day: 1.00    Years: 25.00    Pack years: 25.00    Types: Cigarettes   Smokeless tobacco: Never  Vaping Use   Vaping Use: Never used  Substance and Sexual Activity   Alcohol use: No    Alcohol/week: 0.0 standard drinks   Drug use: No   Sexual activity: Yes    Birth control/protection: Post-menopausal, Surgical  Other Topics Concern   Not on file  Social History Narrative   Lives at home w/ her husband   2 children - ages 41 & 62   Right-sided   Caffeine: 2 beverages per day   Social Determinants of Health   Financial Resource Strain: Not on file  Food Insecurity: Not on file  Transportation Needs: Not on file  Physical Activity: Not on file  Stress: Not on file  Social Connections: Not on file   Outpatient Encounter Medications as of 05/05/2021  Medication Sig   acetaminophen (TYLENOL) 500 MG tablet Take 500 mg by mouth every  6 (six) hours as needed for moderate pain or headache.   albuterol (VENTOLIN HFA) 108 (90 Base) MCG/ACT inhaler Inhale 2 puffs into the lungs every 6 (six) hours as needed for wheezing or shortness of breath.   ALPRAZolam (XANAX) 0.25 MG tablet TAKE 1/2 TO 1 TABLET BY MOUTH TWICE DAILY AS NEEDED FOR ANXIETY. (Patient taking differently: Take 0.625 mg by mouth daily as needed for anxiety.)   Ascorbic Acid (VITAMIN C PO) Take 1 tablet by mouth daily. With elderberry   aspirin EC 81 MG tablet Take 81 mg by mouth daily. Swallow whole.   Bromfenac Sodium (PROLENSA) 0.07 % SOLN Place 1 drop into the right eye 4 (four) times daily.   CALCIUM PO Take 750 mg by mouth 2 (two) times daily with a meal.   cholecalciferol (VITAMIN D3) 25 MCG (1000 UNIT) tablet Take 2,000 Units by mouth daily.   cycloSPORINE (RESTASIS) 0.05 % ophthalmic emulsion Place 1 drop into both eyes 2 (two) times daily.   ibuprofen (ADVIL) 600 MG  tablet Take 1 tablet (600 mg total) by mouth every 6 (six) hours as needed.   metroNIDAZOLE (FLAGYL) 500 MG tablet Take 1 tablet (500 mg total) by mouth 2 (two) times daily with a meal.   Multiple Vitamins-Minerals (MULTIVITAMIN WITH MINERALS) tablet Take 1 tablet by mouth daily.    nitrofurantoin, macrocrystal-monohydrate, (MACROBID) 100 MG capsule Take 1 capsule (100 mg total) by mouth 2 (two) times daily.   nystatin (MYCOSTATIN) 100000 UNIT/ML suspension Swish and swallow one teaspoon QID for 7 days   omeprazole (PRILOSEC) 40 MG capsule Take 1 capsule (40 mg total) by mouth daily.   prednisoLONE acetate (PRED FORTE) 1 % ophthalmic suspension Place 1 drop into the right eye 4 (four) times daily.   rosuvastatin (CRESTOR) 5 MG tablet Take one tablet po each day   silver sulfADIAZINE (SILVADENE) 1 % cream Apply BID prn (Patient taking differently: Apply 1 application topically 2 (two) times daily as needed (burns).)   traMADol (ULTRAM) 50 MG tablet tramadol 50 mg tablet   Triamcinolone Acetonide (NASACORT ALLERGY 24HR NA) Place 2 sprays into the nose daily.   No facility-administered encounter medications on file as of 05/05/2021.   ALLERGIES: Allergies  Allergen Reactions   Ciprofloxacin Other (See Comments)    Patient states with in five minutes of taking developed a splitting headache,itching all over and burning in mouth and lips.   Augmentin [Amoxicillin-Pot Clavulanate] Diarrhea   Carafate [Sucralfate]     Mouth tingling   Celexa [Citalopram Hydrobromide] Nausea And Vomiting   Chantix [Varenicline] Nausea And Vomiting   Sulfa Antibiotics Itching   Wellbutrin [Bupropion]     Severe weight loss and loss of appetite   Fosamax [Alendronate Sodium] Nausea And Vomiting    GERD can not tolerate    VACCINATION STATUS: Immunization History  Administered Date(s) Administered   Influenza,inj,Quad PF,6+ Mos 09/14/2017, 07/04/2018, 04/25/2019, 07/14/2020   PFIZER(Purple Top)SARS-COV-2  Vaccination 10/14/2019, 11/04/2019   Pneumococcal Polysaccharide-23 04/25/2019   Tdap 01/19/2018    HPI   Isabel Atkinson is 55 y.o. female who presents today with a medical history as above. she is being seen in follow-up after she was seen in consultation for osteoporosis requested by Kathyrn Drown, MD. She is status post 1 treatment of Prolia subcutaneous injection and she is here for her second treatment. Patient was diagnosed with osteoporosis  approximately  8 years ago. She denies fractures or falls. No dizziness/vertigo/orthostasis. She was treated with Fosamax which  she did not tolerate.  She was also treated with what appears to be Reclast infusion also did not tolerate prior to 5 years ago. She has history of vitamin D deficiency currently on vitamin D and calcium supplements.  She also eats dairy and green, leafy, vegetables.   No weight bearing exercises.  She denies loss of height. She has prior exposure to on and off steroids due to COPD/reactive airway disease, however denies chronic exposure to heavy dose steroids. She denies any prior history of parathyroid, thyroid dysfunctions.  No history of nephrolithiasis.  Her most recent calcium level is 9.6.  No h/o CKD. Last BUN/Cr: 15/0.56  Menopause was at 55 y/o.   Pt does not have a FH of osteoporosis.  She has dental implants, however no scheduled dental procedure in the next near future.   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  She remains an active smoker.  She also has history of irritable bowel syndrome, prediabetes.  Review of Systems  Constitutional: + No recent major weight change, she has always been light build,  + fatigue, no subjective hyperthermia, no subjective hypothermia   Objective:    BP 128/64   Pulse 72   Ht 5\' 2"  (1.575 m)   Wt 103 lb (46.7 kg)   BMI 18.84 kg/m   Wt Readings from Last 3 Encounters:  05/05/21 103 lb (46.7  kg)  05/01/21 103 lb 12.8 oz (47.1 kg)  12/30/20 103 lb (46.7 kg)    Physical Exam  Constitutional: + BMI of 18.9, not in acute distress, normal state of mind Eyes: PERRLA, EOMI, no exophthalmos ENT: moist mucous membranes, no thyromegaly, no cervical lymphadenopathy   CMP ( most recent) CMP     Component Value Date/Time   NA 145 (H) 04/16/2021 1514   K 3.5 04/16/2021 1514   CL 105 04/16/2021 1514   CO2 27 04/16/2021 1514   GLUCOSE 86 04/16/2021 1514   GLUCOSE 111 (H) 01/11/2017 1440   BUN 11 04/16/2021 1514   CREATININE 0.50 (L) 04/16/2021 1514   CREATININE 0.55 11/03/2013 0951   CALCIUM 9.9 04/16/2021 1514   PROT 6.7 04/16/2021 1514   PROT 7.0 12/09/2011 0900   ALBUMIN 4.8 04/16/2021 1514   AST 25 04/16/2021 1514   AST 18 12/09/2011 0900   ALT 25 04/16/2021 1514   ALKPHOS 62 04/16/2021 1514   ALKPHOS 76 12/09/2011 0900   BILITOT 0.2 04/16/2021 1514   BILITOT 0.3 12/09/2011 0900   GFRNONAA 105 01/11/2020 0811   GFRAA 121 01/11/2020 0811    Lipid Panel     Component Value Date/Time   CHOL 182 06/20/2020 0813   TRIG 50 06/20/2020 0813   HDL 78 06/20/2020 0813   CHOLHDL 2.3 06/20/2020 0813   CHOLHDL 2.5 11/03/2013 0951   VLDL 13 11/03/2013 0951   LDLCALC 94 06/20/2020 0813   LABVLDL 10 06/20/2020 0813      Lab Results  Component Value Date   TSH 1.310 10/23/2020   TSH 1.430 01/11/2020   TSH 1.110 12/01/2017   TSH 1.030 01/14/2016   TSH 1.030 09/10/2015   TSH 0.962 04/25/2015   TSH 0.924 11/03/2013   FREET4 1.32 10/23/2020   FREET4 1.45 01/14/2016     Bone density on October 08, 2020 [facility] L1-L4 10/08/2020 56.4 Osteoporosis -2.9 0.834 g/cm2 -9.9% [facility] L1-L4 06/26/2015 51.1 Osteopenia -2.1 0.926 g/cm2 4.2% - [facility] L1-L4 05/18/2013 49.0 Osteopenia -  2.4 0.889 g/cm2 - -   DualFemur Neck Right 10/08/2020 56.4 Osteoporosis -2.7 0.659 g/cm2 -7.7%  DualFemur Neck Right 06/26/2015 51.1 Osteopenia -2.3 0.714 g/cm2 4.7% - DualFemur Neck Right  05/18/2013 49.0 Osteoporosis -2.6 0.682 g/cm2 -  DualFemur Total Mean 10/08/2020 56.4 Osteopenia -2.2 0.726 g/cm2 -9.5%  DualFemur Total Mean 06/26/2015 51.1 Osteopenia -1.6 0.802 g/cm2 3.9%  DualFemur Total Mean 05/18/2013 49.0 Osteopenia -1.9 0.772 g/cm2 -    ASSESSMENT: BMD as determined from [facility] L1-L4 is 0.834 g/cm2 with a T-Score of -2.9. This patient is considered osteoporotic according to [facility]) criteria.    Assessment: 1. Osteoporosis 2.  Bisphosphonates intolerance, GERD  Plan: 1. Osteoporosis -Likely multifactorial including postmenopausal, steroids, smoking.   -I discussed her available bone density studies, 3 studies between 2014 and 2022 with her, discussed about increased risk of fracture, depending on the T score. - We discussed about the different medication classes, benefits and side effects (including atypical fractures and ONJ - no dental workup in progress or planned).  -She has severe GERD, did not tolerate oral bisphosphonates.  She did not tolerate IV bisphosphonate either.  She is status post first injection of Prolia 6 months ago with no side effects.  She is here for her second treatment.  Her previsit CMP are favorable.   She will be given her Prolia injection today and In 6 months.   She has no evidence of primary hyperparathyroidism nor thyroid dysfunction.      -She is made aware of the fact that she cannot withdrawal Prolia treatment without proper replacement/backup to avoid atypical fracture. - I explained the mechanism of action and expected benefits.   - we reviewed her dietary and supplemental calcium and vitamin D intake, which I advised her to increase her vitamin D to 2000 units daily, calcium 750 mg twice a day.  We also discussed food sources of this elements and vitamin.  - discussed fall precautions   -She is chronic active smoker: The patient was counseled on the dangers of tobacco use, and was advised to  quit.  Reviewed strategies to maximize success, including removing cigarettes and smoking materials from environment. -Her next bone density is not due until March 2024.     She was observed for 15 minutes after Prolia injection for reactions.  She was stable to discharge.  - I advised patient to maintain close follow up with Kathyrn Drown, MD for primary care needs.   I spent 32 minutes in the care of the patient today including review of labs from Thyroid Function, CMP, and other relevant labs ; imaging/biopsy records (current and previous including abstractions from other facilities); face-to-face time discussing  her lab results and symptoms, medications doses, her options of short and long term treatment based on the latest standards of care / guidelines;   and documenting the encounter.  Isabel Atkinson  participated in the discussions, expressed understanding, and voiced agreement with the above plans.  All questions were answered to her satisfaction. she is encouraged to contact clinic should she have any questions or concerns prior to her return visit.    Follow up plan: Return in about 6 months (around 11/02/2021), or Prolia today, for F/U with Pre-visit Labs, Prolia During NV.   Glade Lloyd, MD [facility] [address] [place], [place] [address] Phone: [phone]  Fax: [phone]     05/05/2021, 5:30 PM  This note was partially dictated with voice  recognition software. Similar sounding words can be transcribed inadequately or may not  be corrected upon review.

## 2021-05-06 LAB — CHLAMYDIA/GONOCOCCUS/TRICHOMONAS, NAA
Chlamydia by NAA: NEGATIVE
Gonococcus by NAA: NEGATIVE
Trich vag by NAA: NEGATIVE

## 2021-05-07 ENCOUNTER — Other Ambulatory Visit: Payer: Self-pay

## 2021-05-07 ENCOUNTER — Ambulatory Visit (HOSPITAL_COMMUNITY)
Admission: RE | Admit: 2021-05-07 | Discharge: 2021-05-07 | Disposition: A | Discharge status: Home / Self Care | Payer: BC Managed Care – PPO | Source: Ambulatory Visit | Attending: Nurse Practitioner | Admitting: Nurse Practitioner

## 2021-05-07 DIAGNOSIS — R102 Pelvic and perineal pain: Secondary | ICD-10-CM | POA: Diagnosis present

## 2021-05-08 ENCOUNTER — Ambulatory Visit: Payer: BC Managed Care – PPO | Admitting: Nurse Practitioner

## 2021-05-09 ENCOUNTER — Other Ambulatory Visit: Payer: Self-pay | Admitting: Nurse Practitioner

## 2021-05-12 NOTE — Progress Notes (Signed)
[facility] Note  05/18/2021     CHIEF COMPLAINT Patient presents for Retina Follow Up   HISTORY OF PRESENT ILLNESS: Isabel Atkinson is a 55 y.o. female who presents to the clinic today for:   HPI     Retina Follow Up   Patient presents with  Other.  In right eye.  This started months ago.  Severity is moderate.  Duration of 3 weeks.  Since onset it is gradually improving.  I, the attending physician,  performed the HPI with the patient and updated documentation appropriately.        Comments   55 y/o female pt here for 3 wk f/u for ret edema OD.  Feels [facility] OD continues to gradually improve.  No change in [place] OS.  Denies pain, FOL, floaters.  Has been off Pred for about 2 wks.  Dr. Marisa Hua took pt down to Vyzulta QHS OD and Simbrinza BID OD.  Pt also using Prolensa BID OD.      Last edited by Bernarda Caffey, MD on 05/18/2021 10:24 PM.    Pt states vision is stable  Referring physician: Baruch Goldmann, MD [address], [address] [place], [place]  [address]  HISTORICAL INFORMATION:   Selected notes from the MEDICAL RECORD NUMBER Referred by Dr. Marisa Hua LEE: 05.02.22 Ocular Hx- VMT OD, PCIOL OU, YAG OU, Ocular HTN, Steroid Responder BCVA: OD 20/80+, OS 20/20   CURRENT MEDICATIONS: Current Outpatient Medications (Ophthalmic Drugs)  Medication Sig   cycloSPORINE (RESTASIS) 0.05 % ophthalmic emulsion Place 1 drop into both eyes 2 (two) times daily.   SIMBRINZA 1-0.2 % SUSP Place 1 drop into the right eye 2 (two) times daily.   Bromfenac Sodium (PROLENSA) 0.07 % SOLN Place 1 drop into the right eye 2 (two) times daily.   prednisoLONE acetate (PRED FORTE) 1 % ophthalmic suspension Place 1 drop into the right eye 4 (four) times daily. (Patient not taking: Reported on 05/18/2021)   No current facility-administered medications for this visit. (Ophthalmic Drugs)   Current Outpatient Medications (Other)  Medication Sig   acetaminophen (TYLENOL) 500  MG tablet Take 500 mg by mouth every 6 (six) hours as needed for moderate pain or headache.   albuterol (VENTOLIN HFA) 108 (90 Base) MCG/ACT inhaler Inhale 2 puffs into the lungs every 6 (six) hours as needed for wheezing or shortness of breath.   ALPRAZolam (XANAX) 0.25 MG tablet TAKE 1/2 TO 1 TABLET BY MOUTH TWICE DAILY AS NEEDED FOR ANXIETY. (Patient taking differently: Take 0.625 mg by mouth daily as needed for anxiety.)   Ascorbic Acid (VITAMIN C PO) Take 1 tablet by mouth daily. With elderberry   aspirin EC 81 MG tablet Take 81 mg by mouth daily. Swallow whole.   CALCIUM PO Take 750 mg by mouth 2 (two) times daily with a meal.   cholecalciferol (VITAMIN D3) 25 MCG (1000 UNIT) tablet Take 2,000 Units by mouth daily.   EPINEPHRINE 0.3 mg/0.3 mL IJ SOAJ injection INJECT INTO OUTER THIGH AND HOLD AGAINST LEG FOR 10 SECONDS FOR SEVERE ALLERGIC REACTION.   ibuprofen (ADVIL) 600 MG tablet Take 1 tablet (600 mg total) by mouth every 6 (six) hours as needed.   metroNIDAZOLE (FLAGYL) 500 MG tablet Take 1 tablet (500 mg total) by mouth 2 (two) times daily with a meal.   Multiple Vitamins-Minerals (MULTIVITAMIN WITH MINERALS) tablet Take 1 tablet by mouth daily.    nitrofurantoin, macrocrystal-monohydrate, (MACROBID) 100 MG capsule Take 1 capsule (  100 mg total) by mouth 2 (two) times daily.   nystatin (MYCOSTATIN) 100000 UNIT/ML suspension Swish and swallow one teaspoon QID for 7 days   omeprazole (PRILOSEC) 40 MG capsule Take 1 capsule (40 mg total) by mouth daily.   rosuvastatin (CRESTOR) 5 MG tablet Take one tablet po each day   silver sulfADIAZINE (SILVADENE) 1 % cream Apply BID prn (Patient taking differently: Apply 1 application topically 2 (two) times daily as needed (burns).)   traMADol (ULTRAM) 50 MG tablet tramadol 50 mg tablet   Triamcinolone Acetonide (NASACORT ALLERGY 24HR NA) Place 2 sprays into the nose daily.   No current facility-administered medications for this visit. (Other)    REVIEW OF SYSTEMS: ROS   Positive for: Gastrointestinal, Cardiovascular, Eyes Negative for: Constitutional, Neurological, Skin, Genitourinary, Musculoskeletal, HENT, Endocrine, Respiratory, Psychiatric, Allergic/Imm, Heme/Lymph Last edited by Matthew Folks, COA on 05/18/2021  3:12 PM.     ALLERGIES Allergies  Allergen Reactions   Ciprofloxacin Other (See Comments)    Patient states with in five minutes of taking developed a splitting headache,itching all over and burning in mouth and lips.   Augmentin [Amoxicillin-Pot Clavulanate] Diarrhea   Carafate [Sucralfate]     Mouth tingling   Celexa [Citalopram Hydrobromide] Nausea And Vomiting   Chantix [Varenicline] Nausea And Vomiting   Sulfa Antibiotics Itching   Wellbutrin [Bupropion]     Severe weight loss and loss of appetite   Fosamax [Alendronate Sodium] Nausea And Vomiting    GERD can not tolerate    PAST MEDICAL HISTORY Past Medical History:  Diagnosis Date   Anxiety    GERD (gastroesophageal reflux disease)    Hypercholesterolemia    Pre-diabetes    Reactive airway disease    Tachycardia    Past Surgical History:  Procedure Laterality Date   CATARACT EXTRACTION W/PHACO Left 01/17/2017   Procedure: CATARACT EXTRACTION PHACO AND INTRAOCULAR LENS PLACEMENT ([place]);  Surgeon: Tonny Branch, MD;  Location: [facility];  Service: Ophthalmology;  Laterality: Left;  CDE: 6.92   CATARACT EXTRACTION W/PHACO Right 03/07/2020   Procedure: CATARACT EXTRACTION PHACO AND INTRAOCULAR LENS PLACEMENT RIGHT EYE;  Surgeon: Baruch Goldmann, MD;  Location: [facility];  Service: Ophthalmology;  Laterality: Right;  CDE: 8.44   COLONOSCOPY  2013   RMR: Normal rectum. single diminutive sigmoid polyp noted above. Normal terminal ileum. Status post segmental biopsy. next TCS in 10 years.    ESOPHAGOGASTRODUODENOSCOPY  2013   RMR: Possible cervical esophageal web-status post dilation as described above. Small hiatal hernia. Status post biopsy of normal  appearing small bowel to screen for celiac disease.    ESOPHAGOGASTRODUODENOSCOPY N/A 05/06/2016   Procedure: ESOPHAGOGASTRODUODENOSCOPY (EGD);  Surgeon: Daneil Dolin, MD;  Location: [facility];  Service: Endoscopy;  Laterality: N/A;  3:00 pm - pt knows to arrive at 2:30   EYE SURGERY Bilateral    Cat Sx - [place] N/A 05/06/2016   Procedure: Venia Minks DILATION;  Surgeon: Daneil Dolin, MD;  Location: [facility];  Service: Endoscopy;  Laterality: N/A;   NECK SURGERY  10/2012   OVARIAN CYST SURGERY  1997   removed   TUBAL LIGATION  6967   YAG LASER APPLICATION Bilateral     FAMILY HISTORY Family History  Problem Relation Age of Onset   Colon cancer Maternal Grandfather 37   Colon polyps Mother 57   Colon cancer Maternal Uncle 44   Hypertension Father    Diabetes Father    Heart disease  Father    Hyperlipidemia Father    Stroke Father     SOCIAL HISTORY Social History   Tobacco Use   Smoking status: Every Day    Packs/day: 1.00    Years: 25.00    Pack years: 25.00    Types: Cigarettes   Smokeless tobacco: Never  Vaping Use   Vaping Use: Never used  Substance Use Topics   Alcohol use: No    Alcohol/week: 0.0 standard drinks   Drug use: No       OPHTHALMIC EXAM:  Base Eye Exam     Visual Acuity (Snellen - Linear)       Right Left   Dist [place] 20/25 20/20   Dist ph [place] NI          Tonometry (Tonopen, 3:15 PM)       Right Left   Pressure 13 14         Pupils       Dark Light Shape React APD   Right 3 2 Round Brisk None   Left 3 2 Round Brisk None         Visual Fields (Counting fingers)       Left Right    Full Full         Extraocular Movement       Right Left    Full, Ortho Full, Ortho         Neuro/Psych     Oriented x3: Yes   Mood/Affect: Normal         Dilation     Both eyes: 1.0% Mydriacyl, 2.5% Phenylephrine @ 3:15 PM           Slit Lamp and Fundus Exam     Slit Lamp Exam        Right Left   Lids/Lashes Dermatochalasis - upper lid, mild MGD Dermatochalasis - upper lid, mild MGD   Conjunctiva/Sclera nasal and temporal pinguecula, Conjunctivochalasis IT nasal and temporal pinguecula, Conjunctivochalasis IT   Cornea well healed cataract wound well healed cataract wound   Anterior Chamber deep and clear, No cell or flare, rare pigment deep and clear   Iris Round and moderately dilated Round and moderately dilated   Lens MF PC IOL in good position with open PC MF PC IOL in good position with open PC   Vitreous Vitreous syneresis Vitreous syneresis         Fundus Exam       Right Left   Disc Pink and Sharp Pink and Sharp   C/D Ratio 0.3 0.3   Macula Blunted foveal reflex, mild, persistent central cyst, +ERM Flat, good foveal reflex, mild RPE mottling, No heme or edema   Vessels attenuated, Tortuous, mild Copper wiring mild attenuation, mild Copper wiring   Periphery Attached Attached           IMAGING AND PROCEDURES  Imaging and Procedures for 05/18/2021  OCT, Retina - OU - Both Eyes       Right Eye Quality was good. Central Foveal Thickness: 340. Progression has been stable. Findings include abnormal foveal contour, intraretinal fluid, vitreomacular adhesion , epiretinal membrane, vitreous traction, no SRF (Persistent central cyst, persistent ERM/VMT).   Left Eye Quality was good. Central Foveal Thickness: 288. Progression has been stable. Findings include normal foveal contour, no IRF, no SRF (Partial PVD).   Notes *Images captured and stored on drive  Diagnosis / Impression:  OD: Persistent central cyst, persistent ERM/VMT -- stable OS: NFP, no IRF/SRF  Clinical management:  See below  Abbreviations: NFP - Normal foveal profile. CME - cystoid macular edema. PED - pigment epithelial detachment. IRF - intraretinal fluid. SRF - subretinal fluid. EZ - ellipsoid zone. ERM - epiretinal membrane. ORA - outer retinal atrophy. ORT - outer retinal  tubulation. SRHM - subretinal hyper-reflective material. IRHM - intraretinal hyper-reflective material            ASSESSMENT/PLAN:   ICD-10-CM   1. Retinal edema  H35.81 OCT, Retina - OU - Both Eyes    2. Vitreomacular adhesion of right eye  H43.821     3. Epiretinal membrane (ERM) of right eye  H35.371     4. CME (cystoid macular edema), right  H35.351     5. Pseudophakia, both eyes  Z96.1     6. Hypertensive retinopathy of both eyes  H35.033     1-4. Central retinal edema OD  - central cystic changes -- likely multifactorial  - ERM and VMT possibly contributing  - pt reports history of prolonged inflammation post cataract surgeries and YAGs OU -- [place]  - suspect portion of edema OD driven by CME post YAG cap OD (on 4.18.22)  - OCT shows stable improvement in central edema / cyst on just Prolensa BID (PF tapered off)  - FA (07.08.22) shows mild, late perifoveal staining; ?mild hyperfluorescence of disc  - cont Prolensa BID OD -- for CME component  - IOP okay today OD at13-- on Simbrinza BID OD and Vyzulta QHS OD, managed by Dr. Marisa Hua  - f/u in 3 months -- DFE/OCT  5. Pseudophakia OU  - s/p CE/IOL OU (OD - Dr. Marisa Hua, 7.30.2021; OS - Dr. Geoffry Paradise, 6.11.2018)  - IOLs in good position, doing well  - s/p YAG OU (OD 4.18.22)  - monitor   Ophthalmic Meds Ordered this visit:  Meds ordered this encounter  Medications   Bromfenac Sodium (PROLENSA) 0.07 % SOLN    Sig: Place 1 drop into the right eye 2 (two) times daily.    Dispense:  3 mL    Refill:  3     Return in about 3 months (around 08/18/2021) for f/u central retinal edema OD, DFE, OCT.  There are no Patient Instructions on file for this visit.  This document serves as a record of services personally performed by Gardiner Sleeper, MD, PhD. It was created on their behalf by Leeann Must, [place], an ophthalmic technician. The creation of this record is the provider's dictation and/or activities during the visit.     Electronically signed by: Leeann Must, COA 10.10.22 @ 10:29 PM  This document serves as a record of services personally performed by Gardiner Sleeper, MD, PhD. It was created on their behalf by San Jetty. Owens Shark, OA an ophthalmic technician. The creation of this record is the provider's dictation and/or activities during the visit.    Electronically signed by: San Jetty. Owens Shark, [place] 10.10.2022 10:29 PM  Gardiner Sleeper, M.D., Ph.D. Diseases & Surgery of the Retina and [place] 05/18/2021   I have reviewed the above documentation for accuracy and completeness, and I agree with the above. Gardiner Sleeper, M.D., Ph.D. 05/18/21 10:29 PM  Abbreviations: M myopia (nearsighted); A astigmatism; H hyperopia (farsighted); P presbyopia; Mrx spectacle prescription;  CTL contact lenses; OD right eye; OS left eye; OU both eyes  XT exotropia; ET esotropia; PEK punctate epithelial keratitis; PEE punctate epithelial erosions; DES dry eye syndrome; MGD meibomian gland dysfunction; ATs  artificial tears; PFAT's preservative free artificial tears; [place] nuclear sclerotic cataract; PSC posterior subcapsular cataract; ERM epi-retinal membrane; PVD posterior vitreous detachment; RD retinal detachment; DM diabetes mellitus; DR diabetic retinopathy; NPDR non-proliferative diabetic retinopathy; PDR proliferative diabetic retinopathy; CSME clinically significant macular edema; DME diabetic macular edema; dbh dot blot hemorrhages; CWS cotton wool spot; POAG primary open angle glaucoma; C/D cup-to-disc ratio; HVF humphrey visual field; GVF goldmann visual field; OCT optical coherence tomography; IOP intraocular pressure; BRVO Branch retinal vein occlusion; CRVO central retinal vein occlusion; CRAO central retinal artery occlusion; BRAO branch retinal artery occlusion; RT retinal tear; SB scleral buckle; PPV pars plana vitrectomy; VH Vitreous hemorrhage; PRP panretinal laser photocoagulation; IVK  intravitreal kenalog; VMT vitreomacular traction; MH Macular hole;  NVD neovascularization of the disc; NVE neovascularization elsewhere; AREDS age related eye disease study; ARMD age related macular degeneration; POAG primary open angle glaucoma; EBMD epithelial/anterior basement membrane dystrophy; ACIOL anterior chamber intraocular lens; IOL intraocular lens; PCIOL posterior chamber intraocular lens; Phaco/IOL phacoemulsification with intraocular lens placement; [place] photorefractive keratectomy; LASIK laser assisted in situ keratomileusis; HTN hypertension; DM diabetes mellitus; COPD chronic obstructive pulmonary disease

## 2021-05-18 ENCOUNTER — Encounter (INDEPENDENT_AMBULATORY_CARE_PROVIDER_SITE_OTHER): Payer: Self-pay | Admitting: Ophthalmology

## 2021-05-18 ENCOUNTER — Other Ambulatory Visit: Payer: Self-pay

## 2021-05-18 ENCOUNTER — Ambulatory Visit (INDEPENDENT_AMBULATORY_CARE_PROVIDER_SITE_OTHER): Payer: BC Managed Care – PPO | Admitting: Ophthalmology

## 2021-05-18 DIAGNOSIS — H35033 Hypertensive retinopathy, bilateral: Secondary | ICD-10-CM | POA: Diagnosis not present

## 2021-05-18 DIAGNOSIS — H43821 Vitreomacular adhesion, right eye: Secondary | ICD-10-CM | POA: Diagnosis not present

## 2021-05-18 DIAGNOSIS — H35371 Puckering of macula, right eye: Secondary | ICD-10-CM | POA: Diagnosis not present

## 2021-05-18 DIAGNOSIS — H35351 Cystoid macular degeneration, right eye: Secondary | ICD-10-CM | POA: Diagnosis not present

## 2021-05-18 DIAGNOSIS — H3581 Retinal edema: Secondary | ICD-10-CM

## 2021-05-18 DIAGNOSIS — Z961 Presence of intraocular lens: Secondary | ICD-10-CM

## 2021-05-18 MED ORDER — PROLENSA 0.07 % OP SOLN: 1.0000 [drp] | Freq: Two times a day (BID) | OPHTHALMIC | 3 refills | Status: DC

## 2021-05-29 ENCOUNTER — Other Ambulatory Visit: Payer: Self-pay | Admitting: Nurse Practitioner

## 2021-05-29 ENCOUNTER — Telehealth: Payer: Self-pay | Admitting: *Deleted

## 2021-05-29 DIAGNOSIS — I700 Atherosclerosis of aorta: Secondary | ICD-10-CM

## 2021-05-29 NOTE — Telephone Encounter (Signed)
[name], only 2 things seen on ultrasound. A small simple cyst most likely benign. Nothing worrisome about this. As a precaution, I checked your family history in the chart and did not see any family history of female cancers. Small amount of fluid in the uterus but nothing specific. No masses or signs of cancer seen. Have you seen any improvement in your pain? Isabel Atkinson   Patient states that when she took the medication for the bacterial infection her pain and symptoms improved and she has no further problems.

## 2021-05-29 NOTE — Telephone Encounter (Signed)
Patient also had not received results of her last low dose CT scan. Discussed results. Due to atherosclerotic changes, will refer back to her cardiologist Dr. Harrington Challenger. Asymptomatic. Warning signs reviewed. To call or go to ED if any problems. Patient agrees with this plan.

## 2021-06-03 ENCOUNTER — Encounter (HOSPITAL_COMMUNITY): Payer: Self-pay

## 2021-06-03 NOTE — Progress Notes (Signed)
Patient notified of LDCT Lung Cancer Screening Results via mail with the recommendation to follow-up in 12 months. Patient's referring provider has been sent a copy of results. Results are as follows:  IMPRESSION: 1. Lung-RADS 2S, benign appearance or behavior. Continue annual screening with low-dose chest CT without contrast in 12 months. 2. The "S" modifier above refers to potentially clinically significant non lung cancer related findings. Specifically, there is aortic atherosclerosis, in addition to left main and 3 vessel coronary artery disease. Please note that although the presence of coronary artery calcium documents the presence of coronary artery disease, the severity of this disease and any potential stenosis cannot be assessed on this non-gated CT examination. Assessment for potential risk factor modification, dietary therapy or pharmacologic therapy may be warranted, if clinically indicated. 3. Mild diffuse bronchial wall thickening with very mild centrilobular and paraseptal emphysema; imaging findings suggestive of underlying COPD.   Aortic Atherosclerosis (ICD10-I70.0) and Emphysema (ICD10-J43.9).

## 2021-06-14 ENCOUNTER — Telehealth: Payer: Self-pay | Admitting: Family Medicine

## 2021-06-14 DIAGNOSIS — I700 Atherosclerosis of aorta: Secondary | ICD-10-CM

## 2021-06-14 DIAGNOSIS — E785 Hyperlipidemia, unspecified: Secondary | ICD-10-CM

## 2021-06-14 DIAGNOSIS — Z79899 Other long term (current) drug therapy: Secondary | ICD-10-CM

## 2021-06-14 NOTE — Telephone Encounter (Signed)
Patient's CAT scan did not show any tumor Does show aortic atherosclerosis and emphysema  Patient is due for a follow-up visit regarding her cholesterol She should do a lipid and metabolic 7 with a follow-up visit within 60 days

## 2021-06-14 NOTE — Telephone Encounter (Signed)
-----   Message from Brien Mates, RN sent at 06/03/2021 10:54 AM EDT ----- Patient's recent LDCT results are as follows:  IMPRESSION: 1. Lung-RADS 2S, benign appearance or behavior. Continue annual screening with low-dose chest CT without contrast in 12 months. 2. The "S" modifier above refers to potentially clinically significant non lung cancer related findings. Specifically, there is aortic atherosclerosis, in addition to left main and 3 vessel coronary artery disease. Please note that although the presence of coronary artery calcium documents the presence of coronary artery disease, the severity of this disease and any potential stenosis cannot be assessed on this non-gated CT examination. Assessment for potential risk factor modification, dietary therapy or pharmacologic therapy may be warranted, if clinically indicated. 3. Mild diffuse bronchial wall thickening with very mild centrilobular and paraseptal emphysema; imaging findings suggestive of underlying COPD.  Aortic Atherosclerosis (ICD10-I70.0) and Emphysema (ICD10-J43.9).  We'll follow up with her in about a year!

## 2021-06-15 NOTE — Telephone Encounter (Signed)
Lab orders placed. Left message to return call  

## 2021-06-15 NOTE — Telephone Encounter (Signed)
Pt states she spoke with carolyn regarding another issue earlier. Pt has appt with cardiology in December. Pt will have blood work done.

## 2021-06-21 ENCOUNTER — Ambulatory Visit (INDEPENDENT_AMBULATORY_CARE_PROVIDER_SITE_OTHER): Payer: BC Managed Care – PPO

## 2021-06-21 ENCOUNTER — Other Ambulatory Visit: Payer: Self-pay

## 2021-06-21 ENCOUNTER — Ambulatory Visit
Admission: RE | Admit: 2021-06-21 | Discharge: 2021-06-21 | Disposition: A | Discharge status: Home / Self Care | Payer: BC Managed Care – PPO | Source: Ambulatory Visit | Attending: Family Medicine | Admitting: Family Medicine

## 2021-06-21 VITALS — BP 136/82 | HR 103 | Temp 99.2°F | Resp 18

## 2021-06-21 DIAGNOSIS — Z20828 Contact with and (suspected) exposure to other viral communicable diseases: Secondary | ICD-10-CM

## 2021-06-21 DIAGNOSIS — R509 Fever, unspecified: Secondary | ICD-10-CM

## 2021-06-21 DIAGNOSIS — S9032XA Contusion of left foot, initial encounter: Secondary | ICD-10-CM | POA: Diagnosis not present

## 2021-06-21 DIAGNOSIS — J069 Acute upper respiratory infection, unspecified: Secondary | ICD-10-CM | POA: Diagnosis not present

## 2021-06-21 DIAGNOSIS — M79672 Pain in left foot: Secondary | ICD-10-CM

## 2021-06-21 MED ORDER — HYDROCODONE-ACETAMINOPHEN 5-325 MG PO TABS: 1.0000 | ORAL_TABLET | Freq: Two times a day (BID) | ORAL | 0 refills | Status: DC | PRN

## 2021-06-21 MED ORDER — OSELTAMIVIR PHOSPHATE 75 MG PO CAPS: 75.0000 mg | ORAL_CAPSULE | Freq: Two times a day (BID) | ORAL | 0 refills | Status: DC

## 2021-06-21 NOTE — ED Provider Notes (Signed)
[facility]    CSN: [id number] Arrival date & time: 06/21/21  1255      History   Chief Complaint Chief Complaint  Patient presents with   Foot Pain    left   Cough   Headache    HPI Isabel Atkinson is a 55 y.o. female.   Patient presenting today with left distal foot pain, bruising, swelling after stubbing her foot onto a wooden stool late last night.  She states she has minimal movement in the third and fourth toes and the distal metacarpals of these toes.  She has been trying ice, over-the-counter pain relievers with minimal relief.  She is also complaining of cough, congestion, headache, fever, chills, body aches for the past 2 days.  She states her grandchild has influenza and she has been around them.  Denies chest pain, shortness of breath, abdominal pain, nausea vomiting or diarrhea.  History of seasonal allergies, reactive airway disease on albuterol as needed.   Past Medical History:  Diagnosis Date   Anxiety    GERD (gastroesophageal reflux disease)    Hypercholesterolemia    Pre-diabetes    Reactive airway disease    Tachycardia     Patient Active Problem List   Diagnosis Date Noted   COVID-19 virus infection 08/21/2020   Suspected closed fracture of rib of right side 07/14/2020   Right upper quadrant abdominal pain 07/14/2020   Need for vaccination 07/14/2020   Urinary frequency 07/14/2020   Rib contusion, right, initial encounter 07/14/2020   Acute rhinosinusitis 06/23/2020   Coronary artery disease involving native heart without angina pectoris 02/26/2020   Aortic atherosclerosis ([place]) 02/26/2020   Current smoker 12/04/2019   Neurodermatitis 06/28/2016   COPD (chronic obstructive pulmonary disease) ([place]) 06/10/2016   Abdominal pain, epigastric 05/04/2016   Gastritis 04/25/2015   Osteoporosis 07/20/2014   Irritable bowel syndrome 07/05/2014   Nausea 01/11/2012   Esophageal dysphagia 01/11/2012   LUQ pain 01/11/2012   GERD  (gastroesophageal reflux disease) 01/11/2012   Chronic diarrhea 01/11/2012    Past Surgical History:  Procedure Laterality Date   CATARACT EXTRACTION W/PHACO Left 01/17/2017   Procedure: CATARACT EXTRACTION PHACO AND INTRAOCULAR LENS PLACEMENT ([place]);  Surgeon: Tonny Branch, MD;  Location: [facility];  Service: Ophthalmology;  Laterality: Left;  CDE: 6.92   CATARACT EXTRACTION W/PHACO Right 03/07/2020   Procedure: CATARACT EXTRACTION PHACO AND INTRAOCULAR LENS PLACEMENT RIGHT EYE;  Surgeon: Baruch Goldmann, MD;  Location: [facility];  Service: Ophthalmology;  Laterality: Right;  CDE: 8.44   COLONOSCOPY  2013   RMR: Normal rectum. single diminutive sigmoid polyp noted above. Normal terminal ileum. Status post segmental biopsy. next TCS in 10 years.    ESOPHAGOGASTRODUODENOSCOPY  2013   RMR: Possible cervical esophageal web-status post dilation as described above. Small hiatal hernia. Status post biopsy of normal appearing small bowel to screen for celiac disease.    ESOPHAGOGASTRODUODENOSCOPY N/A 05/06/2016   Procedure: ESOPHAGOGASTRODUODENOSCOPY (EGD);  Surgeon: Daneil Dolin, MD;  Location: [facility];  Service: Endoscopy;  Laterality: N/A;  3:00 pm - pt knows to arrive at 2:30   EYE SURGERY Bilateral    Cat Sx - [place] N/A 05/06/2016   Procedure: Venia Minks DILATION;  Surgeon: Daneil Dolin, MD;  Location: [facility];  Service: Endoscopy;  Laterality: N/A;   NECK SURGERY  10/2012   OVARIAN CYST SURGERY  1997   removed   TUBAL LIGATION  2000   YAG LASER  APPLICATION Bilateral     OB History   No obstetric history on file.      Home Medications    Prior to Admission medications   Medication Sig Start Date End Date Taking? Authorizing Provider  HYDROcodone-acetaminophen (NORCO/VICODIN) 5-325 MG tablet Take 1 tablet by mouth 2 (two) times daily as needed for moderate pain. 06/21/21  Yes Volney American, PA-C  oseltamivir (TAMIFLU) 75 MG capsule Take 1  capsule (75 mg total) by mouth every 12 (twelve) hours. 06/21/21  Yes Volney American, PA-C  acetaminophen (TYLENOL) 500 MG tablet Take 500 mg by mouth every 6 (six) hours as needed for moderate pain or headache.    [provider]  albuterol (VENTOLIN HFA) 108 (90 Base) MCG/ACT inhaler Inhale 2 puffs into the lungs every 6 (six) hours as needed for wheezing or shortness of breath. 08/21/20   Chalmers Guest, NP  ALPRAZolam (XANAX) 0.25 MG tablet TAKE 1/2 TO 1 TABLET BY MOUTH TWICE DAILY AS NEEDED FOR ANXIETY. Patient taking differently: Take 0.625 mg by mouth daily as needed for anxiety. 02/04/20   Kathyrn Drown, MD  Ascorbic Acid (VITAMIN C PO) Take 1 tablet by mouth daily. With elderberry    [provider]  aspirin EC 81 MG tablet Take 81 mg by mouth daily. Swallow whole.    [provider]  Bromfenac Sodium (PROLENSA) 0.07 % SOLN Place 1 drop into the right eye 2 (two) times daily. 05/18/21   Bernarda Caffey, MD  CALCIUM PO Take 750 mg by mouth 2 (two) times daily with a meal.    [provider]  cholecalciferol (VITAMIN D3) 25 MCG (1000 UNIT) tablet Take 2,000 Units by mouth daily.    [provider]  cycloSPORINE (RESTASIS) 0.05 % ophthalmic emulsion Place 1 drop into both eyes 2 (two) times daily.    [provider]  EPINEPHRINE 0.3 mg/0.3 mL IJ SOAJ injection INJECT INTO OUTER THIGH AND HOLD AGAINST LEG FOR 10 SECONDS FOR SEVERE ALLERGIC REACTION. 05/11/21   Kathyrn Drown, MD  ibuprofen (ADVIL) 600 MG tablet Take 1 tablet (600 mg total) by mouth every 6 (six) hours as needed. 07/06/20   Evalee Jefferson, PA-C  metroNIDAZOLE (FLAGYL) 500 MG tablet Take 1 tablet (500 mg total) by mouth 2 (two) times daily with a meal. 05/01/21   Nilda Simmer, NP  Multiple Vitamins-Minerals (MULTIVITAMIN WITH MINERALS) tablet Take 1 tablet by mouth daily.     [provider]  nitrofurantoin, macrocrystal-monohydrate, (MACROBID) 100 MG capsule  Take 1 capsule (100 mg total) by mouth 2 (two) times daily. 03/20/21   Wurst, [place], PA-C  nystatin (MYCOSTATIN) 100000 UNIT/ML suspension Swish and swallow one teaspoon QID for 7 days 09/15/20   Kathyrn Drown, MD  omeprazole (PRILOSEC) 40 MG capsule Take 1 capsule (40 mg total) by mouth daily. 07/15/20   Carlis Stable, NP  prednisoLONE acetate (PRED FORTE) 1 % ophthalmic suspension Place 1 drop into the right eye 4 (four) times daily. Patient not taking: Reported on 05/18/2021 03/13/21   Bernarda Caffey, MD  rosuvastatin (CRESTOR) 5 MG tablet Take one tablet po each day 02/26/20   Kathyrn Drown, MD  silver sulfADIAZINE (SILVADENE) 1 % cream Apply BID prn Patient taking differently: Apply 1 application topically 2 (two) times daily as needed (burns). 11/08/19   Luking, Elayne Snare, MD  SIMBRINZA 1-0.2 % SUSP Place 1 drop into the right eye 2 (two) times daily. 04/30/21   [provider]  traMADol (ULTRAM) 50 MG tablet tramadol 50 mg tablet    [provider]  Triamcinolone Acetonide (NASACORT ALLERGY 24HR NA) Place 2 sprays into the nose daily.    [provider]    Family History Family History  Problem Relation Age of Onset   Colon cancer Maternal Grandfather 23   Colon polyps Mother 55   Colon cancer Maternal Uncle 7   Hypertension Father    Diabetes Father    Heart disease Father    Hyperlipidemia Father    Stroke Father     Social History Social History   Tobacco Use   Smoking status: Every Day    Packs/day: 1.00    Years: 25.00    Pack years: 25.00    Types: Cigarettes   Smokeless tobacco: Never  Vaping Use   Vaping Use: Never used  Substance Use Topics   Alcohol use: No    Alcohol/week: 0.0 standard drinks   Drug use: No     Allergies   Ciprofloxacin, Augmentin [amoxicillin-pot clavulanate], Carafate [sucralfate], Celexa [citalopram hydrobromide], Chantix [varenicline], Sulfa antibiotics, Wellbutrin [bupropion], and Fosamax [alendronate  sodium]   Review of Systems Review of Systems Per HPI  Physical Exam Triage Vital Signs ED Triage Vitals [06/21/21 1344]  Enc Vitals Group     BP 136/82     Pulse Rate (!) 103     Resp 18     Temp 99.2 F (37.3 C)     Temp Source Oral     SpO2 97 %     Weight      Height      Head Circumference      Peak Flow      Pain Score      Pain Loc      Pain Edu?      Excl. in [place]?    No data found.  Updated Vital Signs BP 136/82 (BP Location: Right Arm)   Pulse (!) 103   Temp 99.2 F (37.3 C) (Oral)   Resp 18   SpO2 97%   Visual Acuity Right Eye Distance:   Left Eye Distance:   Bilateral Distance:    Right Eye Near:   Left Eye Near:    Bilateral Near:     Physical Exam Vitals and nursing note reviewed.  Constitutional:      Appearance: Normal appearance. She is not ill-appearing.  HENT:     Head: Atraumatic.     Right Ear: Tympanic membrane normal.     Left Ear: Tympanic membrane normal.     Nose: Rhinorrhea present.     Mouth/Throat:     Mouth: Mucous membranes are moist.     Pharynx: Posterior oropharyngeal erythema present. No oropharyngeal exudate.  Eyes:     Extraocular Movements: Extraocular movements intact.     Conjunctiva/sclera: Conjunctivae normal.  Cardiovascular:     Rate and Rhythm: Normal rate and regular rhythm.     Heart sounds: Normal heart sounds.  Pulmonary:     Effort: Pulmonary effort is normal.     Breath sounds: Normal breath sounds. No wheezing or rales.  Musculoskeletal:        General: Swelling, tenderness and signs of injury present. Normal range of motion.     Cervical back: Normal range of motion and neck supple.     Comments: Third and fourth left digits and metacarpals edematous, tender to palpation with normal range of motion.  Able to bear weight on the posterior portion  of the foot but significantly painful bearing weight on the front of the foot.  Skin:    General: Skin is warm and dry.     Findings: Bruising present.  No erythema or rash.     Comments: Bruising of left third and fourth toes and metacarpals, edema to these areas and significant tenderness to palpation.  Neurological:     Mental Status: She is alert and oriented to person, place, and time.     Comments: Left foot neurovascularly intact  Psychiatric:        Mood and Affect: Mood normal.        Thought Content: Thought content normal.        Judgment: Judgment normal.     UC Treatments / Results  Labs (all labs ordered are listed, but only abnormal results are displayed) Labs Reviewed  COVID-19, FLU A+B NAA    EKG   Radiology DG Foot Complete Left  Result Date: 06/21/2021 CLINICAL DATA:  pain injury EXAM: LEFT FOOT - COMPLETE 3+ VIEW COMPARISON:  None. FINDINGS: Osteopenia. No acute fracture or dislocation. Joint spaces and alignment are maintained. No area of erosion or osseous destruction. No unexpected radiopaque foreign body. Soft tissues are unremarkable. IMPRESSION: No acute fracture or dislocation. Electronically Signed   By: Valentino Saxon M.D.   On: 06/21/2021 14:09    Procedures Procedures (including critical care time)  Medications Ordered in UC Medications - No data to display  Initial Impression / Assessment and Plan / UC Course  I have reviewed the triage vital signs and the nursing notes.  Pertinent labs & imaging results that were available during my care of the patient were reviewed by me and considered in my medical decision making (see chart for details).     Minimally tachycardic in triage, likely secondary to pain from walking in.  Viral upper respiratory likely influenza given exposures and symptoms up.  COVID, flu testing pending, will start Tamiflu while awaiting this proactively.  Discussed over-the-counter cold and congestion medications, supportive home care.  Return for acutely worsening symptoms.  X-ray left foot negative for acute bony abnormality, suspect contusion causing her pain.   Discussed RICE protocol, postop shoe given for comfort and ambulation, small supply of hydrocodone given for pain relief as needed with precautions.  PDMP reviewed and appropriate.  Work note given.  Return for acutely worsening symptoms.  Final Clinical Impressions(s) / UC Diagnoses   Final diagnoses:  Fever, unspecified  Viral URI with cough  Exposure to influenza  Contusion of left foot, initial encounter   Discharge Instructions   None    ED Prescriptions     Medication Sig Dispense Auth. Provider   HYDROcodone-acetaminophen (NORCO/VICODIN) 5-325 MG tablet Take 1 tablet by mouth 2 (two) times daily as needed for moderate pain. 10 tablet Volney American, [place]   oseltamivir (TAMIFLU) 75 MG capsule Take 1 capsule (75 mg total) by mouth every 12 (twelve) hours. 10 capsule Volney American, [place]      I have reviewed the PDMP during this encounter.   Volney American, [place] 06/21/21 1430

## 2021-06-21 NOTE — ED Triage Notes (Signed)
Pt presents today with c/o of left foot pain. She reports stubbing it yesterday on kitchen stools. Bruising/pain noted to left foot. She also c/o of cough, headache and fever that began on Friday. She requests to be tested for Flu. She was exposed by grandchildren.

## 2021-06-22 LAB — COVID-19, FLU A+B NAA
Influenza A, NAA: DETECTED — AB
Influenza B, NAA: NOT DETECTED
SARS-CoV-2, NAA: NOT DETECTED

## 2021-07-29 ENCOUNTER — Encounter: Payer: Self-pay | Admitting: Internal Medicine

## 2021-07-29 ENCOUNTER — Ambulatory Visit: Payer: BC Managed Care – PPO | Admitting: Internal Medicine

## 2021-07-29 ENCOUNTER — Other Ambulatory Visit: Payer: Self-pay

## 2021-07-29 VITALS — BP 154/82 | HR 90 | Ht 62.0 in | Wt 99.4 lb

## 2021-07-29 DIAGNOSIS — R002 Palpitations: Secondary | ICD-10-CM

## 2021-07-29 DIAGNOSIS — I700 Atherosclerosis of aorta: Secondary | ICD-10-CM | POA: Diagnosis not present

## 2021-07-29 NOTE — Patient Instructions (Signed)
Medication Instructions:  Your physician recommends that you continue on your current medications as directed. Please refer to the Current Medication list given to you today.  *If you need a refill on your cardiac medications before your next appointment, please call your pharmacy*   Lab Work: Your physician recommends that you return for lab work in: F[place]   If you have labs (blood work) drawn today and your tests are completely normal, you will receive your results only by: [place] (if you have [place]) OR A paper copy in the mail If you have any lab test that is abnormal or we need to change your treatment, we will call you to review the results.   Testing/Procedures: NONE    Follow-Up: At [facility], you and your health needs are our priority.  As part of our continuing mission to provide you with exceptional heart care, we have created designated Provider Care Teams.  These Care Teams include your primary Cardiologist (physician) and Advanced Practice Providers (APPs -  Physician Assistants and Nurse Practitioners) who all work together to provide you with the care you need, when you need it.  We recommend signing up for the patient portal called "MyChart".  Sign up information is provided on this After Visit Summary.  MyChart is used to connect with patients for Virtual Visits (Telemedicine).  Patients are able to view lab/test results, encounter notes, upcoming appointments, etc.  Non-urgent messages can be sent to your provider as well.   To learn more about what you can do with MyChart, go to [email or web address].    Your next appointment:    July   The format for your next appointment:   In Person  Provider:   Dorris Carnes, MD    Other Instructions Thank you for choosing [facility]!

## 2021-07-29 NOTE — Progress Notes (Signed)
Cardiology Office Note   Date:  07/29/2021   ID:  Isabel Atkinson, DOB 05/26/1966, MRN [id number]  PCP:  Kathyrn Drown, MD  Cardiologist:  Brooks Kinnan/ Jory Sims NP   Pt presents for eval of CAD    History of Present Illness: Isabel Atkinson is a 55 y.o. female with a history of palpitations, COPD, GERD, and neuropathy.  Event monitor was neg for arrhythmia, Myoview was normal.  The pt was last in cardiology clinc in Jan 2021   Since then she says her palpitations have gone away   She is off of her b blocker  In Aug 2022 she had a CT of chest for  lung cancer screening   Coronary calcifications noted as well as aorta calcifications  She is here today to discuss  Pt denies CP   She stays very busy   Works in [employer] care of grandkids   Says activty is good    No SOB   No palpitations   Takes Crestor 2x per week   Slows her down if she takes more often   She says she is still smoking about 1ppd   Trying to quit   Outpatient Medications Prior to Visit  Medication Sig Dispense Refill   acetaminophen (TYLENOL) 500 MG tablet Take 500 mg by mouth every 6 (six) hours as needed for moderate pain or headache.     albuterol (VENTOLIN HFA) 108 (90 Base) MCG/ACT inhaler Inhale 2 puffs into the lungs every 6 (six) hours as needed for wheezing or shortness of breath. 8 g 0   ALPRAZolam (XANAX) 0.25 MG tablet TAKE 1/2 TO 1 TABLET BY MOUTH TWICE DAILY AS NEEDED FOR ANXIETY. (Patient taking differently: Take 0.625 mg by mouth daily as needed for anxiety.) 30 tablet 0   Ascorbic Acid (VITAMIN C PO) Take 1 tablet by mouth daily. With elderberry     aspirin EC 81 MG tablet Take 81 mg by mouth daily. Swallow whole.     Bromfenac Sodium (PROLENSA) 0.07 % SOLN Place 1 drop into the right eye 2 (two) times daily. 3 mL 3   CALCIUM PO Take 750 mg by mouth 2 (two) times daily with a meal.     cholecalciferol (VITAMIN D3) 25 MCG (1000 UNIT) tablet Take 2,000 Units by mouth daily.      cycloSPORINE (RESTASIS) 0.05 % ophthalmic emulsion Place 1 drop into both eyes 2 (two) times daily.     EPINEPHRINE 0.3 mg/0.3 mL IJ SOAJ injection INJECT INTO OUTER THIGH AND HOLD AGAINST LEG FOR 10 SECONDS FOR SEVERE ALLERGIC REACTION. 2 each 3   HYDROcodone-acetaminophen (NORCO/VICODIN) 5-325 MG tablet Take 1 tablet by mouth 2 (two) times daily as needed for moderate pain. 10 tablet 0   ibuprofen (ADVIL) 600 MG tablet Take 1 tablet (600 mg total) by mouth every 6 (six) hours as needed. 30 tablet 0   metroNIDAZOLE (FLAGYL) 500 MG tablet Take 1 tablet (500 mg total) by mouth 2 (two) times daily with a meal. 14 tablet 0   Multiple Vitamins-Minerals (MULTIVITAMIN WITH MINERALS) tablet Take 1 tablet by mouth daily.      nitrofurantoin, macrocrystal-monohydrate, (MACROBID) 100 MG capsule Take 1 capsule (100 mg total) by mouth 2 (two) times daily. 10 capsule 0   nystatin (MYCOSTATIN) 100000 UNIT/ML suspension Swish and swallow one teaspoon QID for 7 days 473 mL 0   omeprazole (PRILOSEC) 40 MG capsule Take 1 capsule (40 mg total) by mouth daily. 30 capsule  2   oseltamivir (TAMIFLU) 75 MG capsule Take 1 capsule (75 mg total) by mouth every 12 (twelve) hours. 10 capsule 0   prednisoLONE acetate (PRED FORTE) 1 % ophthalmic suspension Place 1 drop into the right eye 4 (four) times daily. 15 mL 0   rosuvastatin (CRESTOR) 5 MG tablet Take one tablet po each day 30 tablet 5   silver sulfADIAZINE (SILVADENE) 1 % cream Apply BID prn (Patient taking differently: Apply 1 application topically 2 (two) times daily as needed (burns).) 50 g 6   SIMBRINZA 1-0.2 % SUSP Place 1 drop into the right eye 2 (two) times daily.     traMADol (ULTRAM) 50 MG tablet tramadol 50 mg tablet     Triamcinolone Acetonide (NASACORT ALLERGY 24HR NA) Place 2 sprays into the nose daily.     No facility-administered medications prior to visit.     Allergies:   Ciprofloxacin, Augmentin [amoxicillin-pot clavulanate], Carafate [sucralfate],  Celexa [citalopram hydrobromide], Chantix [varenicline], Sulfa antibiotics, Wellbutrin [bupropion], and Fosamax [alendronate sodium]   Past Medical History:  Diagnosis Date   Anxiety    GERD (gastroesophageal reflux disease)    Hypercholesterolemia    Pre-diabetes    Reactive airway disease    Tachycardia     Past Surgical History:  Procedure Laterality Date   CATARACT EXTRACTION W/PHACO Left 01/17/2017   Procedure: CATARACT EXTRACTION PHACO AND INTRAOCULAR LENS PLACEMENT ([place]);  Surgeon: Tonny Branch, MD;  Location: [facility];  Service: Ophthalmology;  Laterality: Left;  CDE: 6.92   CATARACT EXTRACTION W/PHACO Right 03/07/2020   Procedure: CATARACT EXTRACTION PHACO AND INTRAOCULAR LENS PLACEMENT RIGHT EYE;  Surgeon: Baruch Goldmann, MD;  Location: [facility];  Service: Ophthalmology;  Laterality: Right;  CDE: 8.44   COLONOSCOPY  2013   RMR: Normal rectum. single diminutive sigmoid polyp noted above. Normal terminal ileum. Status post segmental biopsy. next TCS in 10 years.    ESOPHAGOGASTRODUODENOSCOPY  2013   RMR: Possible cervical esophageal web-status post dilation as described above. Small hiatal hernia. Status post biopsy of normal appearing small bowel to screen for celiac disease.    ESOPHAGOGASTRODUODENOSCOPY N/A 05/06/2016   Procedure: ESOPHAGOGASTRODUODENOSCOPY (EGD);  Surgeon: Daneil Dolin, MD;  [facility];  Service: Endoscopy;  Laterality: N/A;  3:00 pm - pt knows to arrive at 2:30   EYE SURGERY Bilateral    Cat Sx - [place] N/A 05/06/2016   Procedure: Venia Minks DILATION;  Surgeon: Daneil Dolin, MD;  [facility];  Service: Endoscopy;  Laterality: N/A;   NECK SURGERY  10/2012   OVARIAN CYST SURGERY  1997   removed   TUBAL LIGATION  8250   YAG LASER APPLICATION Bilateral      Social History:  The patient  reports that she has been smoking cigarettes. She has a 25.00 pack-year smoking history. She has never used smokeless tobacco.  She reports that she does not drink alcohol and does not use drugs.   Family History:  The patient's family history includes Colon cancer (age of onset: 30) in her maternal uncle; Colon cancer (age of onset: 71) in her maternal grandfather; Colon polyps (age of onset: 68) in her mother; Diabetes in her father; Heart disease in her father; Hyperlipidemia in her father; Hypertension in her father; Stroke in her father.    ROS:  Please see the history of present illness. All other systems are reviewed and  Negative to the above problem except as noted.  PHYSICAL EXAM: VS:  BP (!) 154/82    Pulse 90    Ht 5\' 2"  (1.575 m)    Wt 99 lb 6.4 oz (45.1 kg)    SpO2 99%    BMI 18.18 kg/m   GEN: Thin 55 yo  in no acute distress  HEENT: normal  Neck: no JVD, no carotid bruits Cardiac: RRR; no murmurs,  No LE edema  Respiratory:  clear to auscultation bilaterally, normal work of breathing GI: soft, nontender, nondistended, + BS  No hepatomegaly  MS: no deformity Moving all extremities   Skin: warm and dry, no rash Neuro:  Strength and sensation are intact Psych: euthymic mood, full affect  EKG  SR 90  Nonspecific ST changes     Lipid Panel    Component Value Date/Time   CHOL 182 06/20/2020 0813   TRIG 50 06/20/2020 0813   HDL 78 06/20/2020 0813   CHOLHDL 2.3 06/20/2020 0813   CHOLHDL 2.5 11/03/2013 0951   VLDL 13 11/03/2013 0951   LDLCALC 94 06/20/2020 0813      Wt Readings from Last 3 Encounters:  07/29/21 99 lb 6.4 oz (45.1 kg)  05/05/21 103 lb (46.7 kg)  05/01/21 103 lb 12.8 oz (47.1 kg)    EKG   Not done today   ASSESSMENT AND PLAN:  1  CAD  Pt with CAD on CT scan   Reviewed images with her    Nothing sounds flow limiting   She is active   Follow   Rx risk factors Lipids   BP    Take ecASA 81 mg   2  HTN  Pt says her BP is never this high   FOllow   Get cuff at home compared to other cuffs  Call if high    Plan to follow in summer   3  HL   Last lpids in 2021  LDL 93  Will  get lipomed   Need tight control  4 Palpitations   Pt denies   Follow   5   Tob use    Counselled on cessaton     F/U will be July 2023  Current medicines are reviewed at length with the patient today.  The patient does not have concerns regarding medicines.  Signed, Dorris Carnes, MD  07/29/2021 10:08 AM    [address], [place], [place]  [id number] Phone: [phone]; Fax: [phone]

## 2021-08-09 HISTORY — PX: ELBOW SURGERY: SHX618

## 2021-08-13 NOTE — Progress Notes (Signed)
[facility] Note  08/18/2021     CHIEF COMPLAINT Patient presents for Retina Follow Up  HISTORY OF PRESENT ILLNESS: Isabel Atkinson is a 56 y.o. female who presents to the clinic today for:   HPI     Retina Follow Up   Patient presents with  Other.  In right eye.  Severity is moderate.  Duration of 3 months.  Since onset it is gradually improving.  I, the attending physician,  performed the HPI with the patient and updated documentation appropriately.        Comments   Patient states vision improved OD. Using prolensa and simbrinza bid OD.       Last edited by Bernarda Caffey, MD on 08/22/2021  8:35 PM.     Referring physician: Baruch Goldmann, MD [address] [place], [place]  [address]  HISTORICAL INFORMATION:   Selected notes from the MEDICAL RECORD NUMBER Referred by Dr. Marisa Hua LEE: 05.02.22 Ocular Hx- VMT OD, PCIOL OU, YAG OU, Ocular HTN, Steroid Responder BCVA: OD 20/80+, OS 20/20   CURRENT MEDICATIONS: Current Outpatient Medications (Ophthalmic Drugs)  Medication Sig   Bromfenac Sodium (PROLENSA) 0.07 % SOLN Place 1 drop into the right eye 2 (two) times daily.   cycloSPORINE (RESTASIS) 0.05 % ophthalmic emulsion Place 1 drop into both eyes 2 (two) times daily.   SIMBRINZA 1-0.2 % SUSP Place 1 drop into the right eye 2 (two) times daily.   prednisoLONE acetate (PRED FORTE) 1 % ophthalmic suspension Place 1 drop into the right eye 4 (four) times daily. (Patient not taking: Reported on 08/18/2021)   No current facility-administered medications for this visit. (Ophthalmic Drugs)   Current Outpatient Medications (Other)  Medication Sig   acetaminophen (TYLENOL) 500 MG tablet Take 500 mg by mouth every 6 (six) hours as needed for moderate pain or headache.   albuterol (VENTOLIN HFA) 108 (90 Base) MCG/ACT inhaler Inhale 2 puffs into the lungs every 6 (six) hours as needed for wheezing or shortness of breath.   ALPRAZolam  (XANAX) 0.25 MG tablet TAKE 1/2 TO 1 TABLET BY MOUTH TWICE DAILY AS NEEDED FOR ANXIETY. (Patient taking differently: Take 0.625 mg by mouth daily as needed for anxiety.)   Ascorbic Acid (VITAMIN C PO) Take 1 tablet by mouth daily. With elderberry   aspirin EC 81 MG tablet Take 81 mg by mouth daily. Swallow whole.   CALCIUM PO Take 750 mg by mouth 2 (two) times daily with a meal.   cholecalciferol (VITAMIN D3) 25 MCG (1000 UNIT) tablet Take 2,000 Units by mouth daily.   EPINEPHRINE 0.3 mg/0.3 mL IJ SOAJ injection INJECT INTO OUTER THIGH AND HOLD AGAINST LEG FOR 10 SECONDS FOR SEVERE ALLERGIC REACTION.   HYDROcodone-acetaminophen (NORCO/VICODIN) 5-325 MG tablet Take 1 tablet by mouth 2 (two) times daily as needed for moderate pain.   ibuprofen (ADVIL) 600 MG tablet Take 1 tablet (600 mg total) by mouth every 6 (six) hours as needed.   metroNIDAZOLE (FLAGYL) 500 MG tablet Take 1 tablet (500 mg total) by mouth 2 (two) times daily with a meal.   Multiple Vitamins-Minerals (MULTIVITAMIN WITH MINERALS) tablet Take 1 tablet by mouth daily.    nitrofurantoin, macrocrystal-monohydrate, (MACROBID) 100 MG capsule Take 1 capsule (100 mg total) by mouth 2 (two) times daily.   nystatin (MYCOSTATIN) 100000 UNIT/ML suspension Swish and swallow one teaspoon QID for 7 days   omeprazole (PRILOSEC) 40 MG capsule Take 1 capsule (40 mg total) by  mouth daily.   oseltamivir (TAMIFLU) 75 MG capsule Take 1 capsule (75 mg total) by mouth every 12 (twelve) hours.   rosuvastatin (CRESTOR) 5 MG tablet TAKE ONE TABLET BY MOUTH TWICE A WEEK   silver sulfADIAZINE (SILVADENE) 1 % cream Apply BID prn (Patient taking differently: Apply 1 application topically 2 (two) times daily as needed (burns).)   traMADol (ULTRAM) 50 MG tablet tramadol 50 mg tablet   Triamcinolone Acetonide (NASACORT ALLERGY 24HR NA) Place 2 sprays into the nose daily.   No current facility-administered medications for this visit. (Other)   REVIEW OF  SYSTEMS: ROS   Positive for: Gastrointestinal, Cardiovascular, Eyes Negative for: Constitutional, Neurological, Skin, Genitourinary, Musculoskeletal, HENT, Endocrine, Respiratory, Psychiatric, Allergic/Imm, Heme/Lymph Last edited by Jobe Marker, COT on 08/18/2021  2:56 PM.     ALLERGIES Allergies  Allergen Reactions   Ciprofloxacin Other (See Comments)    Patient states with in five minutes of taking developed a splitting headache,itching all over and burning in mouth and lips.   Augmentin [Amoxicillin-Pot Clavulanate] Diarrhea   Carafate [Sucralfate]     Mouth tingling   Celexa [Citalopram Hydrobromide] Nausea And Vomiting   Chantix [Varenicline] Nausea And Vomiting   Sulfa Antibiotics Itching   Wellbutrin [Bupropion]     Severe weight loss and loss of appetite   Fosamax [Alendronate Sodium] Nausea And Vomiting    GERD can not tolerate   PAST MEDICAL HISTORY Past Medical History:  Diagnosis Date   Anxiety    GERD (gastroesophageal reflux disease)    Hypercholesterolemia    Pre-diabetes    Reactive airway disease    Tachycardia    Past Surgical History:  Procedure Laterality Date   CATARACT EXTRACTION W/PHACO Left 01/17/2017   Procedure: CATARACT EXTRACTION PHACO AND INTRAOCULAR LENS PLACEMENT ([place]);  Surgeon: Tonny Branch, MD;  Location: [facility];  Service: Ophthalmology;  Laterality: Left;  CDE: 6.92   CATARACT EXTRACTION W/PHACO Right 03/07/2020   Procedure: CATARACT EXTRACTION PHACO AND INTRAOCULAR LENS PLACEMENT RIGHT EYE;  Surgeon: Baruch Goldmann, MD;  Location: [facility];  Service: Ophthalmology;  Laterality: Right;  CDE: 8.44   COLONOSCOPY  2013   RMR: Normal rectum. single diminutive sigmoid polyp noted above. Normal terminal ileum. Status post segmental biopsy. next TCS in 10 years.    ESOPHAGOGASTRODUODENOSCOPY  2013   RMR: Possible cervical esophageal web-status post dilation as described above. Small hiatal hernia. Status post biopsy of normal appearing small bowel  to screen for celiac disease.    ESOPHAGOGASTRODUODENOSCOPY N/A 05/06/2016   Procedure: ESOPHAGOGASTRODUODENOSCOPY (EGD);  Surgeon: Daneil Dolin, MD;  Location: [facility];  Service: Endoscopy;  Laterality: N/A;  3:00 pm - pt knows to arrive at 2:30   EYE SURGERY Bilateral    Cat Sx - [place] N/A 05/06/2016   Procedure: Venia Minks DILATION;  Surgeon: Daneil Dolin, MD;  Location: [facility];  Service: Endoscopy;  Laterality: N/A;   NECK SURGERY  10/2012   OVARIAN CYST SURGERY  1997   removed   TUBAL LIGATION  8588   YAG LASER APPLICATION Bilateral    FAMILY HISTORY Family History  Problem Relation Age of Onset   Colon cancer Maternal Grandfather 4   Colon polyps Mother 6   Colon cancer Maternal Uncle 30   Hypertension Father    Diabetes Father    Heart disease Father    Hyperlipidemia Father    Stroke Father    SOCIAL HISTORY Social History  Tobacco Use   Smoking status: Every Day    Packs/day: 1.00    Years: 25.00    Pack years: 25.00    Types: Cigarettes   Smokeless tobacco: Never  Vaping Use   Vaping Use: Never used  Substance Use Topics   Alcohol use: No    Alcohol/week: 0.0 standard drinks   Drug use: No       OPHTHALMIC EXAM:  Base Eye Exam     Visual Acuity (Snellen - Linear)       Right Left   Dist [place] 20/25 20/20 -2   Dist ph [place] NI          Tonometry (Tonopen, 3:04 PM)       Right Left   Pressure 19 21         Pupils       Dark Light Shape React APD   Right 3 2 Round Brisk None   Left 3 2 Round Brisk None         Visual Fields (Counting fingers)       Left Right    Full Full         Extraocular Movement       Right Left    Full, Ortho Full, Ortho         Neuro/Psych     Oriented x3: Yes   Mood/Affect: Normal         Dilation     Both eyes: 1.0% Mydriacyl, 2.5% Phenylephrine @ 3:04 PM           Slit Lamp and Fundus Exam     Slit Lamp Exam       Right Left    Lids/Lashes Dermatochalasis - upper lid, mild MGD Dermatochalasis - upper lid, mild MGD   Conjunctiva/Sclera nasal and temporal pinguecula, Conjunctivochalasis IT nasal and temporal pinguecula, Conjunctivochalasis IT   Cornea well healed cataract wound well healed cataract wound   Anterior Chamber deep and clear, No cell or flare, rare pigment deep and clear   Iris Round and moderately dilated Round and moderately dilated   Lens MF PC IOL in good position with open PC MF PC IOL in good position with open PC   Anterior Vitreous Vitreous syneresis Vitreous syneresis         Fundus Exam       Right Left   Disc Pink and Sharp Pink and Sharp   C/D Ratio 0.3 0.3   Macula Good foveal reflex, mild, persistent central cyst - slightly improved, +ERM Flat, good foveal reflex, mild RPE mottling, No heme or edema   Vessels attenuated, Tortuous, mild Copper wiring mild attenuation, mild Copper wiring   Periphery Attached Attached           IMAGING AND PROCEDURES  Imaging and Procedures for 08/18/2021  OCT, Retina - OU - Both Eyes       Right Eye Quality was good. Central Foveal Thickness: 326. Progression has improved. Findings include abnormal foveal contour, intraretinal fluid, vitreomacular adhesion , epiretinal membrane, vitreous traction, no SRF (Interval improvement in central cyst, partial release of central VMT).   Left Eye Quality was good. Central Foveal Thickness: 289. Progression has been stable. Findings include normal foveal contour, no IRF, no SRF (Partial PVD).   Notes *Images captured and stored on drive  Diagnosis / Impression:  OD: Interval improvement in central cyst, partial release of central VMT OS: NFP, no IRF/SRF  Clinical management:  See below  Abbreviations: NFP -  Normal foveal profile. CME - cystoid macular edema. PED - pigment epithelial detachment. IRF - intraretinal fluid. SRF - subretinal fluid. EZ - ellipsoid zone. ERM - epiretinal membrane. ORA -  outer retinal atrophy. ORT - outer retinal tubulation. SRHM - subretinal hyper-reflective material. IRHM - intraretinal hyper-reflective material             ASSESSMENT/PLAN:   ICD-10-CM   1. Retinal edema  H35.81     2. Vitreomacular adhesion of right eye  H43.821 OCT, Retina - OU - Both Eyes    3. Epiretinal membrane (ERM) of right eye  H35.371 OCT, Retina - OU - Both Eyes    4. CME (cystoid macular edema), right  H35.351 OCT, Retina - OU - Both Eyes    5. Pseudophakia, both eyes  Z96.1      1-4. Central retinal edema OD  - central cystic changes -- likely multifactorial  - ERM and VMT possibly contributing  - pt reports history of prolonged inflammation post cataract surgeries and YAGs OU -- [place]  - suspect portion of edema OD driven by CME post YAG cap OD (on 4.18.22)  - OCT shows interval improvement in central cyst, partial release of central VMT on just Prolensa BID (PF tapered off)  - BCVA 20/25 OD  - FA (07.08.22) shows mild, late perifoveal staining; ?mild hyperfluorescence of disc  - cont Prolensa BID OD -- for CME component  - IOP okay today OD at 19-- on Simbrinza BID OD and Vyzulta QHS OD, managed by Dr. Marisa Hua  - f/u in 3 months -- DFE/OCT  5. Pseudophakia OU  - s/p CE/IOL OU (OD - Dr. Marisa Hua, 7.30.2021; OS - Dr. Geoffry Paradise, 6.11.2018)  - IOLs in good position, doing well  - s/p YAG OU (OD 4.18.22)  - monitor   Ophthalmic Meds Ordered this visit:  No orders of the defined types were placed in this encounter.    Return in about 3 months (around 11/16/2021) for f/u central retinal edema OD, DFE, OCT.  There are no Patient Instructions on file for this visit.  This document serves as a record of services personally performed by Gardiner Sleeper, MD, PhD. It was created on their behalf by Estill Bakes, COT an ophthalmic technician. The creation of this record is the provider's dictation and/or activities during the visit.    Electronically signed by:  Estill Bakes, COT 1.5.23 @ 8:38 PM   This document serves as a record of services personally performed by Gardiner Sleeper, MD, PhD. It was created on their behalf by San Jetty. Owens Shark, OA an ophthalmic technician. The creation of this record is the provider's dictation and/or activities during the visit.    Electronically signed by: San Jetty. Owens Shark, [place] 01.10.2023 8:38 PM   Gardiner Sleeper, M.D., Ph.D. Diseases & Surgery of the Retina and Vitreous [facility]  I have reviewed the above documentation for accuracy and completeness, and I agree with the above. Gardiner Sleeper, M.D., Ph.D. 08/22/21 8:38 PM   Abbreviations: M myopia (nearsighted); A astigmatism; H hyperopia (farsighted); P presbyopia; Mrx spectacle prescription;  CTL contact lenses; OD right eye; OS left eye; OU both eyes  XT exotropia; ET esotropia; PEK punctate epithelial keratitis; PEE punctate epithelial erosions; DES dry eye syndrome; MGD meibomian gland dysfunction; ATs artificial tears; PFAT's preservative free artificial tears; [place] nuclear sclerotic cataract; PSC posterior subcapsular cataract; ERM epi-retinal membrane; PVD posterior vitreous detachment; RD retinal detachment; DM diabetes mellitus; DR  diabetic retinopathy; NPDR non-proliferative diabetic retinopathy; PDR proliferative diabetic retinopathy; CSME clinically significant macular edema; DME diabetic macular edema; dbh dot blot hemorrhages; CWS cotton wool spot; POAG primary open angle glaucoma; C/D cup-to-disc ratio; HVF humphrey visual field; GVF goldmann visual field; OCT optical coherence tomography; IOP intraocular pressure; BRVO Branch retinal vein occlusion; CRVO central retinal vein occlusion; CRAO central retinal artery occlusion; BRAO branch retinal artery occlusion; RT retinal tear; SB scleral buckle; PPV pars plana vitrectomy; VH Vitreous hemorrhage; PRP panretinal laser photocoagulation; IVK intravitreal kenalog; VMT vitreomacular  traction; MH Macular hole;  NVD neovascularization of the disc; NVE neovascularization elsewhere; AREDS age related eye disease study; ARMD age related macular degeneration; POAG primary open angle glaucoma; EBMD epithelial/anterior basement membrane dystrophy; ACIOL anterior chamber intraocular lens; IOL intraocular lens; PCIOL posterior chamber intraocular lens; Phaco/IOL phacoemulsification with intraocular lens placement; [place] photorefractive keratectomy; LASIK laser assisted in situ keratomileusis; HTN hypertension; DM diabetes mellitus; COPD chronic obstructive pulmonary disease

## 2021-08-18 ENCOUNTER — Ambulatory Visit (INDEPENDENT_AMBULATORY_CARE_PROVIDER_SITE_OTHER): Payer: BC Managed Care – PPO | Admitting: Ophthalmology

## 2021-08-18 ENCOUNTER — Encounter (INDEPENDENT_AMBULATORY_CARE_PROVIDER_SITE_OTHER): Payer: Self-pay | Admitting: Ophthalmology

## 2021-08-18 ENCOUNTER — Other Ambulatory Visit: Payer: Self-pay

## 2021-08-18 ENCOUNTER — Other Ambulatory Visit: Payer: Self-pay | Admitting: Family Medicine

## 2021-08-18 DIAGNOSIS — H35351 Cystoid macular degeneration, right eye: Secondary | ICD-10-CM | POA: Diagnosis not present

## 2021-08-18 DIAGNOSIS — Z961 Presence of intraocular lens: Secondary | ICD-10-CM | POA: Diagnosis not present

## 2021-08-18 DIAGNOSIS — H3581 Retinal edema: Secondary | ICD-10-CM

## 2021-08-18 DIAGNOSIS — H43821 Vitreomacular adhesion, right eye: Secondary | ICD-10-CM

## 2021-08-18 DIAGNOSIS — H35371 Puckering of macula, right eye: Secondary | ICD-10-CM

## 2021-08-18 NOTE — Telephone Encounter (Signed)
Given that the patient is taking the medication 2 days/week the prescription should be changed to state following 1 pill twice weekly, #10, 5 refills note to pharmacy should stay please give 1 month supply for insurance purposes  FYI-when a person is only taking it 2 days/week the prescription has to reflect this

## 2021-08-18 NOTE — Telephone Encounter (Signed)
Noted and prescription sent.

## 2021-08-22 ENCOUNTER — Encounter (INDEPENDENT_AMBULATORY_CARE_PROVIDER_SITE_OTHER): Payer: Self-pay | Admitting: Ophthalmology

## 2021-09-02 ENCOUNTER — Other Ambulatory Visit (INDEPENDENT_AMBULATORY_CARE_PROVIDER_SITE_OTHER): Payer: Self-pay | Admitting: Ophthalmology

## 2021-09-03 ENCOUNTER — Other Ambulatory Visit: Payer: Self-pay

## 2021-09-03 LAB — LIPID PANEL
Chol/HDL Ratio: 2.7 ratio (ref 0.0–4.4)
Cholesterol, Total: 158 mg/dL (ref 100–199)
HDL: 59 mg/dL (ref >39)
LDL Chol Calc (NIH): 81 mg/dL (ref 0–99)
Triglycerides: 96 mg/dL (ref 0–149)
VLDL Cholesterol Cal: 18 mg/dL (ref 5–40)

## 2021-09-03 LAB — BASIC METABOLIC PANEL
BUN/Creatinine Ratio: 24 — ABNORMAL HIGH (ref 9–23)
BUN: 13 mg/dL (ref 6–24)
CO2: 22 mmol/L (ref 20–29)
Calcium: 8.9 mg/dL (ref 8.7–10.2)
Chloride: 106 mmol/L (ref 96–106)
Creatinine, Ser: 0.54 mg/dL — ABNORMAL LOW (ref 0.57–1.00)
Glucose: 85 mg/dL (ref 70–99)
Potassium: 4 mmol/L (ref 3.5–5.2)
Sodium: 142 mmol/L (ref 134–144)
eGFR: 107 mL/min/{1.73_m2} (ref >59)

## 2021-09-03 LAB — APOLIPOPROTEIN B: Apolipoprotein B: 64 mg/dL (ref <90)

## 2021-09-03 MED ORDER — ROSUVASTATIN CALCIUM 5 MG PO TABS: ORAL_TABLET | ORAL | 5 refills | Status: DC

## 2021-09-07 ENCOUNTER — Other Ambulatory Visit (HOSPITAL_COMMUNITY): Payer: Self-pay | Admitting: Family Medicine

## 2021-09-07 ENCOUNTER — Telehealth: Payer: Self-pay | Admitting: *Deleted

## 2021-09-07 DIAGNOSIS — Z1231 Encounter for screening mammogram for malignant neoplasm of breast: Secondary | ICD-10-CM

## 2021-09-07 DIAGNOSIS — Z13220 Encounter for screening for lipoid disorders: Secondary | ICD-10-CM

## 2021-09-07 NOTE — Telephone Encounter (Signed)
-----   Message from Dorris Carnes V, MD sent at 09/04/2021  3:30 PM EST ----- Review of labs, agree that needs tighter contrl of lipids She has increased t oa few times per week   I would check lipid panel in 8 wks from change

## 2021-09-07 NOTE — Telephone Encounter (Signed)
Pt notified of the need to have labs done in 8 weeks after med change.

## 2021-09-15 ENCOUNTER — Ambulatory Visit
Admission: RE | Admit: 2021-09-15 | Discharge: 2021-09-15 | Disposition: A | Discharge status: Home / Self Care | Payer: BC Managed Care – PPO | Source: Ambulatory Visit | Attending: Family Medicine | Admitting: Family Medicine

## 2021-09-15 ENCOUNTER — Other Ambulatory Visit: Payer: Self-pay

## 2021-09-15 VITALS — BP 131/76 | HR 79 | Temp 98.2°F | Resp 18

## 2021-09-15 DIAGNOSIS — J0100 Acute maxillary sinusitis, unspecified: Secondary | ICD-10-CM

## 2021-09-15 MED ORDER — TRIAMCINOLONE ACETONIDE 55 MCG/ACT NA AERO
1.0000 | INHALATION_SPRAY | Freq: Two times a day (BID) | NASAL | 0 refills | Status: DC
Start: 2021-09-15 — End: 2022-01-20

## 2021-09-15 MED ORDER — DOXYCYCLINE HYCLATE 100 MG PO CAPS
100.0000 mg | ORAL_CAPSULE | Freq: Two times a day (BID) | ORAL | 0 refills | Status: DC
Start: 2021-09-15 — End: 2021-10-27

## 2021-09-15 NOTE — ED Triage Notes (Signed)
Pt presents with headache, cough and nasal congestion for past 2 weeks

## 2021-09-15 NOTE — ED Provider Notes (Signed)
[facility]    CSN: [id number] Arrival date & time: 09/15/21  1053      History   Chief Complaint Chief Complaint  Patient presents with   Nasal Congestion   Cough   Headache    HPI Isabel Atkinson is a 56 y.o. female.   Presenting today with 2-week history of progressively worsening sinus headache, cough, nasal congestion, sinus pain and pressure, fatigue, low-grade fever.  So far taking Mucinex and over-the-counter cold and congestion medications with minimal relief.  No known sick contacts recently.   Past Medical History:  Diagnosis Date   Anxiety    GERD (gastroesophageal reflux disease)    Hypercholesterolemia    Pre-diabetes    Reactive airway disease    Tachycardia     Patient Active Problem List   Diagnosis Date Noted   COVID-19 virus infection 08/21/2020   Suspected closed fracture of rib of right side 07/14/2020   Right upper quadrant abdominal pain 07/14/2020   Need for vaccination 07/14/2020   Urinary frequency 07/14/2020   Rib contusion, right, initial encounter 07/14/2020   Acute rhinosinusitis 06/23/2020   Coronary artery disease involving native heart without angina pectoris 02/26/2020   Aortic atherosclerosis ([place]) 02/26/2020   Current smoker 12/04/2019   Neurodermatitis 06/28/2016   COPD (chronic obstructive pulmonary disease) ([place]) 06/10/2016   Abdominal pain, epigastric 05/04/2016   Gastritis 04/25/2015   Osteoporosis 07/20/2014   Irritable bowel syndrome 07/05/2014   Nausea 01/11/2012   Esophageal dysphagia 01/11/2012   LUQ pain 01/11/2012   GERD (gastroesophageal reflux disease) 01/11/2012   Chronic diarrhea 01/11/2012    Past Surgical History:  Procedure Laterality Date   CATARACT EXTRACTION W/PHACO Left 01/17/2017   Procedure: CATARACT EXTRACTION PHACO AND INTRAOCULAR LENS PLACEMENT ([place]);  Surgeon: Tonny Branch, MD;  Location: [facility];  Service: Ophthalmology;  Laterality: Left;  CDE: 6.92   CATARACT EXTRACTION W/PHACO  Right 03/07/2020   Procedure: CATARACT EXTRACTION PHACO AND INTRAOCULAR LENS PLACEMENT RIGHT EYE;  Surgeon: Baruch Goldmann, MD;  Location: [facility];  Service: Ophthalmology;  Laterality: Right;  CDE: 8.44   COLONOSCOPY  2013   RMR: Normal rectum. single diminutive sigmoid polyp noted above. Normal terminal ileum. Status post segmental biopsy. next TCS in 10 years.    ESOPHAGOGASTRODUODENOSCOPY  2013   RMR: Possible cervical esophageal web-status post dilation as described above. Small hiatal hernia. Status post biopsy of normal appearing small bowel to screen for celiac disease.    ESOPHAGOGASTRODUODENOSCOPY N/A 05/06/2016   Procedure: ESOPHAGOGASTRODUODENOSCOPY (EGD);  Surgeon: Daneil Dolin, MD;  Location: [facility];  Service: Endoscopy;  Laterality: N/A;  3:00 pm - pt knows to arrive at 2:30   EYE SURGERY Bilateral    Cat Sx - [place] N/A 05/06/2016   Procedure: Venia Minks DILATION;  Surgeon: Daneil Dolin, MD;  Location: [facility];  Service: Endoscopy;  Laterality: N/A;   NECK SURGERY  10/2012   OVARIAN CYST SURGERY  1997   removed   TUBAL LIGATION  0272   YAG LASER APPLICATION Bilateral     OB History   No obstetric history on file.      Home Medications    Prior to Admission medications   Medication Sig Start Date End Date Taking? Authorizing Provider  doxycycline (VIBRAMYCIN) 100 MG capsule Take 1 capsule (100 mg total) by mouth 2 (two) times daily. 09/15/21  Yes Volney American, PA-C  triamcinolone (NASACORT) 55 MCG/ACT AERO nasal inhaler  Place 1 spray into the nose 2 (two) times daily. 09/15/21  Yes Volney American, PA-C  acetaminophen (TYLENOL) 500 MG tablet Take 500 mg by mouth every 6 (six) hours as needed for moderate pain or headache.    [provider]  albuterol (VENTOLIN HFA) 108 (90 Base) MCG/ACT inhaler Inhale 2 puffs into the lungs every 6 (six) hours as needed for wheezing or shortness of breath. 08/21/20   Chalmers Guest, NP  ALPRAZolam (XANAX) 0.25 MG tablet TAKE 1/2 TO 1 TABLET BY MOUTH TWICE DAILY AS NEEDED FOR ANXIETY. Patient taking differently: Take 0.625 mg by mouth daily as needed for anxiety. 02/04/20   Kathyrn Drown, MD  Ascorbic Acid (VITAMIN C PO) Take 1 tablet by mouth daily. With elderberry    [provider]  aspirin EC 81 MG tablet Take 81 mg by mouth daily. Swallow whole.    [provider]  CALCIUM PO Take 750 mg by mouth 2 (two) times daily with a meal.    [provider]  cholecalciferol (VITAMIN D3) 25 MCG (1000 UNIT) tablet Take 2,000 Units by mouth daily.    [provider]  cycloSPORINE (RESTASIS) 0.05 % ophthalmic emulsion Place 1 drop into both eyes 2 (two) times daily.    [provider]  EPINEPHRINE 0.3 mg/0.3 mL IJ SOAJ injection INJECT INTO OUTER THIGH AND HOLD AGAINST LEG FOR 10 SECONDS FOR SEVERE ALLERGIC REACTION. 05/11/21   Kathyrn Drown, MD  HYDROcodone-acetaminophen (NORCO/VICODIN) 5-325 MG tablet Take 1 tablet by mouth 2 (two) times daily as needed for moderate pain. 06/21/21   Volney American, PA-C  ibuprofen (ADVIL) 600 MG tablet Take 1 tablet (600 mg total) by mouth every 6 (six) hours as needed. 07/06/20   Evalee Jefferson, PA-C  metroNIDAZOLE (FLAGYL) 500 MG tablet Take 1 tablet (500 mg total) by mouth 2 (two) times daily with a meal. 05/01/21   Nilda Simmer, NP  Multiple Vitamins-Minerals (MULTIVITAMIN WITH MINERALS) tablet Take 1 tablet by mouth daily.     [provider]  nitrofurantoin, macrocrystal-monohydrate, (MACROBID) 100 MG capsule Take 1 capsule (100 mg total) by mouth 2 (two) times daily. 03/20/21   Wurst, [place], PA-C  nystatin (MYCOSTATIN) 100000 UNIT/ML suspension Swish and swallow one teaspoon QID for 7 days 09/15/20   Kathyrn Drown, MD  omeprazole (PRILOSEC) 40 MG capsule Take 1 capsule (40 mg total) by mouth daily. 07/15/20   Carlis Stable, NP  oseltamivir (TAMIFLU) 75 MG capsule Take 1  capsule (75 mg total) by mouth every 12 (twelve) hours. 06/21/21   Volney American, PA-C  prednisoLONE acetate (PRED FORTE) 1 % ophthalmic suspension Place 1 drop into the right eye 4 (four) times daily. Patient not taking: Reported on 08/18/2021 03/13/21   Bernarda Caffey, MD  PROLENSA 0.07 % SOLN INSTILL 1 DROP INTO THE RIGHT EYE TWICE DAILY. 09/04/21   Bernarda Caffey, MD  rosuvastatin (CRESTOR) 5 MG tablet TAKE ONE TABLET BY MOUTH THREE TIMES A WEEK 09/03/21   Kathyrn Drown, MD  silver sulfADIAZINE (SILVADENE) 1 % cream Apply BID prn Patient taking differently: Apply 1 application topically 2 (two) times daily as needed (burns). 11/08/19   Luking, Elayne Snare, MD  SIMBRINZA 1-0.2 % SUSP Place 1 drop into the right eye 2 (two) times daily. 04/30/21   [provider]  traMADol (ULTRAM) 50 MG tablet tramadol 50 mg tablet    [provider]  Triamcinolone Acetonide (NASACORT ALLERGY 24HR NA)  Place 2 sprays into the nose daily.    [provider]    Family History Family History  Problem Relation Age of Onset   Colon cancer Maternal Grandfather 16   Colon polyps Mother 73   Colon cancer Maternal Uncle 16   Hypertension Father    Diabetes Father    Heart disease Father    Hyperlipidemia Father    Stroke Father     Social History Social History   Tobacco Use   Smoking status: Every Day    Packs/day: 1.00    Years: 25.00    Pack years: 25.00    Types: Cigarettes   Smokeless tobacco: Never  Vaping Use   Vaping Use: Never used  Substance Use Topics   Alcohol use: No    Alcohol/week: 0.0 standard drinks   Drug use: No     Allergies   Ciprofloxacin, Augmentin [amoxicillin-pot clavulanate], Carafate [sucralfate], Celexa [citalopram hydrobromide], Chantix [varenicline], Sulfa antibiotics, Wellbutrin [bupropion], and Fosamax [alendronate sodium]   Review of Systems Review of Systems Per HPI  Physical Exam Triage Vital Signs ED Triage Vitals [09/15/21  1109]  Enc Vitals Group     BP 131/76     Pulse Rate 79     Resp 18     Temp 98.2 F (36.8 C)     Temp src      SpO2 98 %     Weight      Height      Head Circumference      Peak Flow      Pain Score      Pain Loc      Pain Edu?      Excl. in [place]?    No data found.  Updated Vital Signs BP 131/76    Pulse 79    Temp 98.2 F (36.8 C)    Resp 18    SpO2 98%   Visual Acuity Right Eye Distance:   Left Eye Distance:   Bilateral Distance:    Right Eye Near:   Left Eye Near:    Bilateral Near:     Physical Exam Vitals and nursing note reviewed.  Constitutional:      Appearance: Normal appearance.  HENT:     Head: Atraumatic.     Right Ear: Tympanic membrane and external ear normal.     Left Ear: Tympanic membrane and external ear normal.     Nose: Congestion present.     Comments: Bilateral maxillary sinuses tender to palpation    Mouth/Throat:     Mouth: Mucous membranes are moist.     Pharynx: Posterior oropharyngeal erythema present.  Eyes:     Extraocular Movements: Extraocular movements intact.     Conjunctiva/sclera: Conjunctivae normal.  Cardiovascular:     Rate and Rhythm: Normal rate and regular rhythm.     Heart sounds: Normal heart sounds.  Pulmonary:     Effort: Pulmonary effort is normal.     Breath sounds: Normal breath sounds. No wheezing.  Musculoskeletal:        General: Normal range of motion.     Cervical back: Normal range of motion and neck supple.  Skin:    General: Skin is warm and dry.  Neurological:     Mental Status: She is alert and oriented to person, place, and time.  Psychiatric:        Mood and Affect: Mood normal.        Thought Content: Thought content normal.  UC Treatments / Results  Labs (all labs ordered are listed, but only abnormal results are displayed) Labs Reviewed - No data to display  EKG   Radiology No results found.  Procedures Procedures (including critical care time)  Medications Ordered in  UC Medications - No data to display  Initial Impression / Assessment and Plan / UC Course  I have reviewed the triage vital signs and the nursing notes.  Pertinent labs & imaging results that were available during my care of the patient were reviewed by me and considered in my medical decision making (see chart for details).     Treat with doxycycline, Nasacort, sinus rinses and Mucinex.  Return for acutely worsening symptoms.  Final Clinical Impressions(s) / UC Diagnoses   Final diagnoses:  Acute maxillary sinusitis, recurrence not specified   Discharge Instructions   None    ED Prescriptions     Medication Sig Dispense Auth. Provider   doxycycline (VIBRAMYCIN) 100 MG capsule Take 1 capsule (100 mg total) by mouth 2 (two) times daily. 14 capsule Volney American, [place]   triamcinolone (NASACORT) 55 MCG/ACT AERO nasal inhaler Place 1 spray into the nose 2 (two) times daily. 1 each Volney American, PA-C      PDMP not reviewed this encounter.   Volney American, [place] 09/15/21 1129

## 2021-10-14 ENCOUNTER — Other Ambulatory Visit: Payer: Self-pay

## 2021-10-14 ENCOUNTER — Other Ambulatory Visit (HOSPITAL_COMMUNITY): Payer: Self-pay | Admitting: Family Medicine

## 2021-10-14 ENCOUNTER — Ambulatory Visit (HOSPITAL_COMMUNITY)
Admission: RE | Admit: 2021-10-14 | Discharge: 2021-10-14 | Disposition: A | Discharge status: Home / Self Care | Payer: BC Managed Care – PPO | Source: Ambulatory Visit | Attending: Family Medicine | Admitting: Family Medicine

## 2021-10-14 DIAGNOSIS — R928 Other abnormal and inconclusive findings on diagnostic imaging of breast: Secondary | ICD-10-CM

## 2021-10-14 DIAGNOSIS — R921 Mammographic calcification found on diagnostic imaging of breast: Secondary | ICD-10-CM

## 2021-10-14 DIAGNOSIS — Z1231 Encounter for screening mammogram for malignant neoplasm of breast: Secondary | ICD-10-CM | POA: Insufficient documentation

## 2021-10-27 ENCOUNTER — Other Ambulatory Visit: Payer: Self-pay

## 2021-10-27 ENCOUNTER — Ambulatory Visit: Payer: BC Managed Care – PPO | Admitting: Nurse Practitioner

## 2021-10-27 ENCOUNTER — Encounter: Payer: Self-pay | Admitting: Nurse Practitioner

## 2021-10-27 VITALS — BP 132/62 | Temp 97.2°F | Wt 102.2 lb

## 2021-10-27 DIAGNOSIS — N952 Postmenopausal atrophic vaginitis: Secondary | ICD-10-CM | POA: Diagnosis not present

## 2021-10-27 DIAGNOSIS — R300 Dysuria: Secondary | ICD-10-CM

## 2021-10-27 LAB — COMPREHENSIVE METABOLIC PANEL
ALT: 16 IU/L (ref 0–32)
AST: 19 IU/L (ref 0–40)
Albumin/Globulin Ratio: 2.4 — ABNORMAL HIGH (ref 1.2–2.2)
Albumin: 4.8 g/dL (ref 3.8–4.9)
Alkaline Phosphatase: 61 IU/L (ref 44–121)
BUN/Creatinine Ratio: 24 — ABNORMAL HIGH (ref 9–23)
BUN: 14 mg/dL (ref 6–24)
Bilirubin Total: 0.5 mg/dL (ref 0.0–1.2)
CO2: 23 mmol/L (ref 20–29)
Calcium: 9.1 mg/dL (ref 8.7–10.2)
Chloride: 104 mmol/L (ref 96–106)
Creatinine, Ser: 0.58 mg/dL (ref 0.57–1.00)
Globulin, Total: 2 g/dL (ref 1.5–4.5)
Glucose: 85 mg/dL (ref 70–99)
Potassium: 3.7 mmol/L (ref 3.5–5.2)
Sodium: 141 mmol/L (ref 134–144)
Total Protein: 6.8 g/dL (ref 6.0–8.5)
eGFR: 105 mL/min/{1.73_m2} (ref >59)

## 2021-10-27 LAB — POCT WET PREP WITH KOH
KOH Prep POC: NEGATIVE
Trichomonas, UA: NEGATIVE

## 2021-10-27 LAB — POCT URINALYSIS DIPSTICK
Spec Grav, UA: >=1.03 — AB (ref 1.010–1.025)
pH, UA: 6 (ref 5.0–8.0)

## 2021-10-27 MED ORDER — CEFDINIR 300 MG PO CAPS: 300.0000 mg | ORAL_CAPSULE | Freq: Two times a day (BID) | ORAL | 0 refills | Status: AC

## 2021-10-27 MED ORDER — REPHRESH VA GEL: 1.0000 | VAGINAL | 3 refills | Status: AC

## 2021-10-27 NOTE — Progress Notes (Signed)
? ?Subjective:  ? ? Patient ID: Isabel Atkinson, female    DOB: 10-22-1965, 56 y.o.   MRN: [id number] ? ?HPI ? ?56 year old postpartum female patient presents to clinic with complaints of low back pain, dysuria, increased urinary frequency, increased urinary urgency, for about 3 days.  Patient states overall she just feels uncomfortable "down there".  Patient denies any fevers, chills, body aches, blood in urine, blood coming from her vagina or rectum.  Patient has attempted to take Tylenol but it has not helped.  Patient does admit to occasional dyspareunia that is improved with the use of K-Y jelly. ? ?Review of Systems  ?Genitourinary:  Positive for dysuria, flank pain, frequency and urgency.  ?All other systems reviewed and are negative. ? ?   ?Objective:  ? Physical Exam ?Exam conducted with a chaperone present.  ?Constitutional:   ?   General: She is not in acute distress. ?   Appearance: Normal appearance. She is normal weight. She is not ill-appearing, toxic-appearing or diaphoretic.  ?Cardiovascular:  ?   Rate and Rhythm: Normal rate and regular rhythm.  ?   Pulses: Normal pulses.  ?   Heart sounds: Normal heart sounds. No murmur heard. ?Pulmonary:  ?   Effort: Pulmonary effort is normal. No respiratory distress.  ?   Breath sounds: Normal breath sounds. No wheezing.  ?Abdominal:  ?   General: Abdomen is flat. Bowel sounds are normal.  ?   Palpations: Abdomen is soft.  ?   Tenderness: There is abdominal tenderness in the suprapubic area. There is right CVA tenderness and left CVA tenderness. There is no guarding or rebound. Negative signs include Murphy's sign and Rovsing's sign.  ?   Hernia: There is no hernia in the left inguinal area or right inguinal area.  ?Genitourinary: ?   Exam position: Lithotomy position.  ?   Pubic Area: No rash or pubic lice.   ?   Labia:     ?   Right: No rash, tenderness, lesion or injury.     ?   Left: No rash, tenderness, lesion or injury.   ?   Urethra: Urethral pain  present. No prolapse, urethral swelling or urethral lesion.  ?   Vagina: No signs of injury and foreign body. Tenderness present. No vaginal discharge, erythema, bleeding, lesions or prolapsed vaginal walls.  ?   Cervix: No cervical motion tenderness, discharge, friability, lesion, erythema, cervical bleeding or eversion.  ?   Uterus: Normal. Tender. Not deviated, not enlarged, not fixed and no uterine prolapse.   ?   Adnexa: Right adnexa normal and left adnexa normal.    ?   Right: No mass, tenderness or fullness.      ?   Left: No mass, tenderness or fullness.    ?   Comments: Labial majora and minora slightly pale however no effusion noted.  Tenderness noted with palpation of urethra. ?Vaginal tenderness noted.  Vaginal walls pale.  Small amount of vaginal discharge noted. ?Musculoskeletal:     ?   General: Normal range of motion.  ?Lymphadenopathy:  ?   Lower Body: No right inguinal adenopathy. No left inguinal adenopathy.  ?Skin: ?   General: Skin is warm.  ?   Capillary Refill: Capillary refill takes less than 2 seconds.  ?Neurological:  ?   General: No focal deficit present.  ?   Mental Status: She is alert and oriented to person, place, and time.  ?Psychiatric:     ?  Mood and Affect: Mood normal.     ?   Behavior: Behavior normal.  ? ? ? ? ? ?   ?Assessment & Plan:  ? ? ?1. Atrophic vaginitis ?- Likely atrophic vaginitis. ?-Low suspicion for UTI due to negative point-of-care UA and no white blood cells or red blood cells noted during microscopic review of urine.  However due to positive CVA we will cover patient with 5 days of Omnicef ?-Low suspicion for bacterial vaginosis or yeast infection due to lack of presence under wet mount. ?- POCT Urinalysis Dipstick ?- Urine Culture pending ?-Omnicef 300 mg twice daily x5 days ?- Vaginal Lubricant (REPHRESH) GEL; Place 1 applicator vaginally every 3 (three) days.  Dispense: 2 g; Refill: 3 ?-We will treat with vaginal lubrication as initial step. ?-If vaginal  lubrication does not help with symptoms we will refer patient to GYN for possible consideration of vaginal estrogen. ?-Return to clinic if symptoms not better or if worse ? ?  ?Note:  This document was prepared using Dragon voice recognition software and may include unintentional dictation errors. ? ? ?

## 2021-10-29 ENCOUNTER — Other Ambulatory Visit: Payer: Self-pay

## 2021-10-29 ENCOUNTER — Ambulatory Visit (HOSPITAL_COMMUNITY)
Admission: RE | Admit: 2021-10-29 | Discharge: 2021-10-29 | Disposition: A | Discharge status: Home / Self Care | Payer: BC Managed Care – PPO | Source: Ambulatory Visit | Attending: Family Medicine | Admitting: Family Medicine

## 2021-10-29 DIAGNOSIS — R928 Other abnormal and inconclusive findings on diagnostic imaging of breast: Secondary | ICD-10-CM | POA: Insufficient documentation

## 2021-10-29 DIAGNOSIS — R921 Mammographic calcification found on diagnostic imaging of breast: Secondary | ICD-10-CM | POA: Insufficient documentation

## 2021-10-29 LAB — URINE CULTURE

## 2021-10-29 LAB — SPECIMEN STATUS REPORT

## 2021-11-02 ENCOUNTER — Encounter: Payer: Self-pay | Admitting: "Endocrinology

## 2021-11-02 ENCOUNTER — Ambulatory Visit: Payer: BC Managed Care – PPO | Admitting: "Endocrinology

## 2021-11-02 ENCOUNTER — Other Ambulatory Visit: Payer: Self-pay

## 2021-11-02 VITALS — BP 122/68 | HR 76 | Ht 62.0 in | Wt 102.2 lb

## 2021-11-02 DIAGNOSIS — M818 Other osteoporosis without current pathological fracture: Secondary | ICD-10-CM

## 2021-11-02 DIAGNOSIS — F17200 Nicotine dependence, unspecified, uncomplicated: Secondary | ICD-10-CM

## 2021-11-02 NOTE — Progress Notes (Signed)
? ?                                                         ?11/02/2021 ?      ? ?Endocrinology follow-up note ? ?Past Medical History:  ?Diagnosis Date  ? Anxiety   ? GERD (gastroesophageal reflux disease)   ? Hypercholesterolemia   ? Pre-diabetes   ? Reactive airway disease   ? Tachycardia   ? ?Past Surgical History:  ?Procedure Laterality Date  ? CATARACT EXTRACTION W/PHACO Left 01/17/2017  ? Procedure: CATARACT EXTRACTION PHACO AND INTRAOCULAR LENS PLACEMENT ([facility]);  Surgeon: Tonny Branch, MD;  Location: [facility];  Service: Ophthalmology;  Laterality: Left;  CDE: 6.92  ? CATARACT EXTRACTION W/PHACO Right 03/07/2020  ? Procedure: CATARACT EXTRACTION PHACO AND INTRAOCULAR LENS PLACEMENT RIGHT EYE;  Surgeon: Baruch Goldmann, MD;  Location: [facility];  Service: Ophthalmology;  Laterality: Right;  CDE: 8.44  ? COLONOSCOPY  2013  ? RMR: Normal rectum. single diminutive sigmoid polyp noted above. Normal terminal ileum. Status post segmental biopsy. next TCS in 10 years.   ? ELBOW SURGERY Right 2023  ? ESOPHAGOGASTRODUODENOSCOPY  2013  ? RMR: Possible cervical esophageal web-status post dilation as described above. Small hiatal hernia. Status post biopsy of normal appearing small bowel to screen for celiac disease.   ? ESOPHAGOGASTRODUODENOSCOPY N/A 05/06/2016  ? Procedure: ESOPHAGOGASTRODUODENOSCOPY (EGD);  Surgeon: Daneil Dolin, MD;  [facility];  Service: Endoscopy;  Laterality: N/A;  3:00 pm - pt knows to arrive at 2:30  ? EYE SURGERY Bilateral   ? Cat Sx - [place] MF IOLs  ? MALONEY DILATION N/A 05/06/2016  ? Procedure: MALONEY DILATION;  Surgeon: Daneil Dolin, MD;  [facility];  Service: Endoscopy;  Laterality: N/A;  ? NECK SURGERY  10/2012  ? OVARIAN CYST SURGERY  1997  ? removed  ? TUBAL LIGATION  2000  ? YAG LASER APPLICATION Bilateral   ? ?Social History  ? ?Socioeconomic History  ? Marital status: Married  ?  Spouse name: Not on file  ? Number of children: 2  ? Years of education: GED   ? Highest education level: Not on file  ?Occupational History  ? 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  Employer: [employer]  ?Tobacco Use  ? Smoking status: Every Day  ?  Packs/day: 1.00  ?  Years: 25.00  ?  Pack years: 25.00  ?  Types: Cigarettes  ? Smokeless tobacco: Never  ?Vaping Use  ? Vaping Use: Never used  ?Substance and Sexual Activity  ? Alcohol use: No  ?  Alcohol/week: 0.0 standard drinks  ? Drug use: No  ? Sexual activity: Yes  ?  Birth control/protection: Post-menopausal, Surgical  ?Other Topics Concern  ? Not on file  ?Social History Narrative  ? Lives at home w/ her husband  ? 2 children - ages 10 & 35  ? Right-sided  ? Caffeine: 2 beverages per day  ? ?Social Determinants of Health  ? ?Financial Resource Strain: Not on file  ?Food Insecurity: Not on file  ?Transportation Needs: Not on file  ?Physical Activity: Not on file  ?Stress: Not on file  ?Social Connections: Not on file  ? ?Outpatient Encounter Medications as of 11/02/2021  ?Medication Sig  ? acetaminophen (TYLENOL) 500 MG tablet  Take 500 mg by mouth every 6 (six) hours as needed for moderate pain or headache.  ? albuterol (VENTOLIN HFA) 108 (90 Base) MCG/ACT inhaler Inhale 2 puffs into the lungs every 6 (six) hours as needed for wheezing or shortness of breath.  ? ALPRAZolam (XANAX) 0.25 MG tablet TAKE 1/2 TO 1 TABLET BY MOUTH TWICE DAILY AS NEEDED FOR ANXIETY. (Patient taking differently: Take 0.625 mg by mouth daily as needed for anxiety.)  ? Ascorbic Acid (VITAMIN C PO) Take 1 tablet by mouth daily. With elderberry  ? aspirin EC 81 MG tablet Take 81 mg by mouth daily. Swallow whole.  ? CALCIUM PO Take 750 mg by mouth 2 (two) times daily with a meal.  ? cholecalciferol (VITAMIN D3) 25 MCG (1000 UNIT) tablet Take 2,000 Units by mouth daily.  ? cycloSPORINE (RESTASIS) 0.05 % ophthalmic emulsion Place 1 drop into both eyes 2 (two) times daily.  ? EPINEPHRINE 0.3 mg/0.3 mL IJ SOAJ injection INJECT INTO OUTER THIGH AND HOLD AGAINST LEG FOR  10 SECONDS FOR SEVERE ALLERGIC REACTION.  ? HYDROcodone-acetaminophen (NORCO/VICODIN) 5-325 MG tablet Take 1 tablet by mouth 2 (two) times daily as needed for moderate pain.  ? ibuprofen (ADVIL) 600 MG tablet Take 1 tablet (600 mg total) by mouth every 6 (six) hours as needed.  ? Multiple Vitamins-Minerals (MULTIVITAMIN WITH MINERALS) tablet Take 1 tablet by mouth daily.   ? nystatin (MYCOSTATIN) 100000 UNIT/ML suspension Swish and swallow one teaspoon QID for 7 days  ? omeprazole (PRILOSEC) 40 MG capsule Take 1 capsule (40 mg total) by mouth daily.  ? prednisoLONE acetate (PRED FORTE) 1 % ophthalmic suspension Place 1 drop into the right eye 4 (four) times daily.  ? PROLENSA 0.07 % SOLN INSTILL 1 DROP INTO THE RIGHT EYE TWICE DAILY.  ? rosuvastatin (CRESTOR) 5 MG tablet TAKE ONE TABLET BY MOUTH THREE TIMES A WEEK  ? silver sulfADIAZINE (SILVADENE) 1 % cream Apply BID prn (Patient taking differently: Apply 1 application. topically 2 (two) times daily as needed (burns).)  ? SIMBRINZA 1-0.2 % SUSP Place 1 drop into the right eye 2 (two) times daily.  ? traMADol (ULTRAM) 50 MG tablet tramadol 50 mg tablet  ? triamcinolone (NASACORT) 55 MCG/ACT AERO nasal inhaler Place 1 spray into the nose 2 (two) times daily.  ? Triamcinolone Acetonide (NASACORT ALLERGY 24HR NA) Place 2 sprays into the nose daily.  ? Vaginal Lubricant (REPHRESH) GEL Place 1 applicator vaginally every 3 (three) days.  ? ?No facility-administered encounter medications on file as of 11/02/2021.  ? ?ALLERGIES: ?Allergies  ?Allergen Reactions  ? Ciprofloxacin Other (See Comments)  ?  Patient states with in five minutes of taking developed a splitting headache,itching all over and burning in mouth and lips.  ? Augmentin [Amoxicillin-Pot Clavulanate] Diarrhea  ? Carafate [Sucralfate]   ?  Mouth tingling  ? Celexa [Citalopram Hydrobromide] Nausea And Vomiting  ? Chantix [Varenicline] Nausea And Vomiting  ? Sulfa Antibiotics Itching  ? Wellbutrin [Bupropion]    ?  Severe weight loss and loss of appetite  ? Fosamax [Alendronate Sodium] Nausea And Vomiting  ?  GERD can not tolerate  ? ? ?VACCINATION STATUS: ?Immunization History  ?Administered Date(s) Administered  ? Influenza,inj,Quad PF,6+ Mos 09/14/2017, 07/04/2018, 04/25/2019, 07/14/2020  ? PFIZER(Purple Top)SARS-COV-2 Vaccination 10/14/2019, 11/04/2019  ? Pneumococcal Polysaccharide-23 04/25/2019  ? Tdap 01/19/2018  ? ? ?HPI  ? ?Isabel Atkinson is 56 y.o. female who presents today with a medical history as above. she is being seen  in follow-up after she was seen in consultation for osteoporosis requested by Kathyrn Drown, MD. ?She is status post 2 treatments of Prolia subcutaneous injection and she is here for her next Prolia injection.  She has no new complaints today.  ? ?Patient was diagnosed with osteoporosis  approximately  8 years ago. She denies fractures or falls. No dizziness/vertigo/orthostasis. ?She was treated with Fosamax which she did not tolerate.  She was also treated with what appears to be Reclast infusion also did not tolerate prior to 5 years ago. ?She has history of vitamin D deficiency currently on vitamin D and calcium supplements. ? She also eats dairy and green, leafy, vegetables.  ? ?No weight bearing exercises.  She denies loss of height. ?She has prior exposure to on and off steroids due to COPD/reactive airway disease, however denies chronic exposure to heavy dose steroids. ?She denies any prior history of parathyroid, thyroid dysfunctions.  No history of nephrolithiasis.  Her most recent calcium level is 9.6. ? ?No h/o CKD. Last BUN/Cr: 15/0.56 ? ?Menopause was at 56 y/o.  ? ?Pt does not have a FH of osteoporosis.  She has dental implants, however no scheduled dental procedure in the next near future.  ? ?I reviewed her chart and she also has a history of COPD from chronic smoking, reactive airways disease.  She remains an active smoker.  She also has history of irritable bowel  syndrome, prediabetes. ? ?Review of Systems ? ?Constitutional: + No recent major weight change, she has always been light build,  + fatigue, no subjective hyperthermia, no subjective hypothermia ? ? ?Objective:

## 2021-11-03 ENCOUNTER — Encounter: Payer: Self-pay | Admitting: Gastroenterology

## 2021-11-03 ENCOUNTER — Ambulatory Visit: Payer: BC Managed Care – PPO | Admitting: Gastroenterology

## 2021-11-03 VITALS — BP 108/64 | HR 83 | Temp 97.0°F | Ht 62.0 in | Wt 102.0 lb

## 2021-11-03 DIAGNOSIS — K219 Gastro-esophageal reflux disease without esophagitis: Secondary | ICD-10-CM | POA: Diagnosis not present

## 2021-11-03 DIAGNOSIS — Z1211 Encounter for screening for malignant neoplasm of colon: Secondary | ICD-10-CM | POA: Insufficient documentation

## 2021-11-03 DIAGNOSIS — R1319 Other dysphagia: Secondary | ICD-10-CM | POA: Diagnosis not present

## 2021-11-03 MED ORDER — OMEPRAZOLE 40 MG PO CPDR: 40.0000 mg | DELAYED_RELEASE_CAPSULE | Freq: Every day | ORAL | 3 refills | Status: DC

## 2021-11-03 NOTE — Progress Notes (Signed)
? ? ? ? ? ?Gastroenterology Office Note   ? ?Referring Provider: Kathyrn Drown, MD ?Primary Care Physician:  Kathyrn Drown, MD  ?Primary GI: Dr. Gala Romney  ? ? ?Chief Complaint  ? ?Chief Complaint  ?Patient presents with  ? Follow-up  ?  Pt also needs appt for her medication  ? ? ? ?History of Present Illness  ? ?Isabel Atkinson is a 56 y.o. female presenting today with history of GERD, last colonoscopy in 2013 and due for routine screening now, and needing additional refills. She was last seen in 2017.EGD in 2017: normal esophagus, small hiatal hernia, empiric dilation.  ? ?Omeprazole prn. Has had some trouble with solid food dysphagia. A lot of nausea. Having sinus issues. No abdominal pain. No vomiting. No weight loss or lack of appetite. No constipation/diarrhea. No rectal bleeding.  ? ?Mom: colon polyps, Maternal grandfather: colon cancer.  ? ? ?Past Medical History:  ?Diagnosis Date  ? Anxiety   ? GERD (gastroesophageal reflux disease)   ? Hypercholesterolemia   ? Pre-diabetes   ? Reactive airway disease   ? Tachycardia   ? ? ?Past Surgical History:  ?Procedure Laterality Date  ? CATARACT EXTRACTION W/PHACO Left 01/17/2017  ? Procedure: CATARACT EXTRACTION PHACO AND INTRAOCULAR LENS PLACEMENT (IOC);  Surgeon: Tonny Branch, MD;  Location: [facility];  Service: Ophthalmology;  Laterality: Left;  CDE: 6.92  ? CATARACT EXTRACTION W/PHACO Right 03/07/2020  ? Procedure: CATARACT EXTRACTION PHACO AND INTRAOCULAR LENS PLACEMENT RIGHT EYE;  Surgeon: Baruch Goldmann, MD;  Location: [facility];  Service: Ophthalmology;  Laterality: Right;  CDE: 8.44  ? COLONOSCOPY  2013  ? RMR: Normal rectum. single diminutive sigmoid polyp noted above. Normal terminal ileum. Status post segmental biopsy. next TCS in 10 years.   ? ELBOW SURGERY Right 2023  ? ESOPHAGOGASTRODUODENOSCOPY  2013  ? RMR: Possible cervical esophageal web-status post dilation as described above. Small hiatal hernia. Status post biopsy of normal appearing small bowel to  screen for celiac disease.   ? ESOPHAGOGASTRODUODENOSCOPY N/A 05/06/2016  ? normal esophagus, small hiatal hernia, empiric dilation.  ? EYE SURGERY Bilateral   ? Cat Sx - Symfony MF IOLs  ? MALONEY DILATION N/A 05/06/2016  ? Procedure: MALONEY DILATION;  Surgeon: Daneil Dolin, MD;  Location: [facility];  Service: Endoscopy;  Laterality: N/A;  ? NECK SURGERY  10/2012  ? OVARIAN CYST SURGERY  1997  ? removed  ? TUBAL LIGATION  2000  ? YAG LASER APPLICATION Bilateral   ? ? ?Current Outpatient Medications  ?Medication Sig Dispense Refill  ? acetaminophen (TYLENOL) 500 MG tablet Take 500 mg by mouth every 6 (six) hours as needed for moderate pain or headache.    ? albuterol (VENTOLIN HFA) 108 (90 Base) MCG/ACT inhaler Inhale 2 puffs into the lungs every 6 (six) hours as needed for wheezing or shortness of breath. 8 g 0  ? ALPRAZolam (XANAX) 0.25 MG tablet TAKE 1/2 TO 1 TABLET BY MOUTH TWICE DAILY AS NEEDED FOR ANXIETY. (Patient taking differently: Take 0.625 mg by mouth daily as needed for anxiety.) 30 tablet 0  ? Ascorbic Acid (VITAMIN C PO) Take 1 tablet by mouth daily. With elderberry    ? aspirin EC 81 MG tablet Take 81 mg by mouth daily. Swallow whole.    ? CALCIUM PO Take 750 mg by mouth 2 (two) times daily with a meal.    ? cholecalciferol (VITAMIN D3) 25 MCG (1000 UNIT) tablet Take 2,000 Units by mouth  daily.    ? cycloSPORINE (RESTASIS) 0.05 % ophthalmic emulsion Place 1 drop into both eyes 2 (two) times daily.    ? denosumab (PROLIA) 60 MG/ML SOSY injection Inject 60 mg into the skin every 6 (six) months.    ? EPINEPHRINE 0.3 mg/0.3 mL IJ SOAJ injection INJECT INTO OUTER THIGH AND HOLD AGAINST LEG FOR 10 SECONDS FOR SEVERE ALLERGIC REACTION. 2 each 3  ? HYDROcodone-acetaminophen (NORCO/VICODIN) 5-325 MG tablet Take 1 tablet by mouth 2 (two) times daily as needed for moderate pain. 10 tablet 0  ? ibuprofen (ADVIL) 600 MG tablet Take 1 tablet (600 mg total) by mouth every 6 (six) hours as needed. 30 tablet 0   ? Multiple Vitamins-Minerals (MULTIVITAMIN WITH MINERALS) tablet Take 1 tablet by mouth daily.     ? nystatin (MYCOSTATIN) 100000 UNIT/ML suspension Swish and swallow one teaspoon QID for 7 days 473 mL 0  ? omeprazole (PRILOSEC) 40 MG capsule Take 1 capsule (40 mg total) by mouth daily. 30 capsule 2  ? prednisoLONE acetate (PRED FORTE) 1 % ophthalmic suspension Place 1 drop into the right eye 4 (four) times daily. 15 mL 0  ? PROLENSA 0.07 % SOLN INSTILL 1 DROP INTO THE RIGHT EYE TWICE DAILY. 3 mL 5  ? rosuvastatin (CRESTOR) 5 MG tablet TAKE ONE TABLET BY MOUTH THREE TIMES A WEEK 10 tablet 5  ? silver sulfADIAZINE (SILVADENE) 1 % cream Apply BID prn (Patient taking differently: Apply 1 application. topically 2 (two) times daily as needed (burns).) 50 g 6  ? SIMBRINZA 1-0.2 % SUSP Place 1 drop into the right eye 2 (two) times daily.    ? traMADol (ULTRAM) 50 MG tablet tramadol 50 mg tablet    ? triamcinolone (NASACORT) 55 MCG/ACT AERO nasal inhaler Place 1 spray into the nose 2 (two) times daily. 1 each 0  ? Triamcinolone Acetonide (NASACORT ALLERGY 24HR NA) Place 2 sprays into the nose daily.    ? Vaginal Lubricant (REPHRESH) GEL Place 1 applicator vaginally every 3 (three) days. 2 g 3  ? ?No current facility-administered medications for this visit.  ? ? ?Allergies as of 11/03/2021 - Review Complete 11/03/2021  ?Allergen Reaction Noted  ? Ciprofloxacin Other (See Comments) 01/26/2018  ? Augmentin [amoxicillin-pot clavulanate] Diarrhea 07/04/2018  ? Carafate [sucralfate]  04/19/2016  ? Celexa [citalopram hydrobromide] Nausea And Vomiting 02/27/2016  ? Chantix [varenicline] Nausea And Vomiting 02/27/2016  ? Sulfa antibiotics Itching 01/11/2012  ? Wellbutrin [bupropion]  01/12/2016  ? Fosamax [alendronate sodium] Nausea And Vomiting 11/09/2014  ? ? ?Family History  ?Problem Relation Age of Onset  ? Colon cancer Maternal Grandfather 22  ? Colon polyps Mother 6  ? Colon cancer Maternal Uncle 62  ? Hypertension Father    ? Diabetes Father   ? Heart disease Father   ? Hyperlipidemia Father   ? Stroke Father   ? ? ?Social History  ? ?Socioeconomic History  ? Marital status: Married  ?  Spouse name: Not on file  ? Number of children: 2  ? Years of education: GED  ? Highest education level: Not on file  ?Occupational History  ? 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  Employer: [employer]  ?Tobacco Use  ? Smoking status: Every Day  ?  Packs/day: 1.00  ?  Years: 25.00  ?  Pack years: 25.00  ?  Types: Cigarettes  ? Smokeless tobacco: Never  ?Vaping Use  ? Vaping Use: Never used  ?Substance and Sexual Activity  ? Alcohol  use: No  ?  Alcohol/week: 0.0 standard drinks  ? Drug use: No  ? Sexual activity: Yes  ?  Birth control/protection: Post-menopausal, Surgical  ?Other Topics Concern  ? Not on file  ?Social History Narrative  ? Lives at home w/ her husband  ? 2 children - ages 10 & 45  ? Right-sided  ? Caffeine: 2 beverages per day  ? ?Social Determinants of Health  ? ?Financial Resource Strain: Not on file  ?Food Insecurity: Not on file  ?Transportation Needs: Not on file  ?Physical Activity: Not on file  ?Stress: Not on file  ?Social Connections: Not on file  ?Intimate Partner Violence: Not on file  ? ? ? ?Review of Systems  ? ?Gen: Denies any fever, chills, fatigue, weight loss, lack of appetite.  ?CV: Denies chest pain, heart palpitations, peripheral edema, syncope.  ?Resp: Denies shortness of breath at rest or with exertion. Denies wheezing or cough.  ?GI: see HPI ?GU : Denies urinary burning, urinary frequency, urinary hesitancy ?MS: Denies joint pain, muscle weakness, cramps, or limitation of movement.  ?Derm: Denies rash, itching, dry skin ?Psych: Denies depression, anxiety, memory loss, and confusion ?Heme: Denies bruising, bleeding, and enlarged lymph nodes. ? ? ?Physical Exam  ? ?BP 108/64   Pulse 83   Temp (!) 97 ?F (36.1 ?C)   Ht '5\' 2"'$  (1.575 m)   Wt 102 lb (46.3 kg)   BMI 18.66 kg/m?  ?General:   Alert and oriented.  Pleasant and cooperative. Well-nourished and well-developed.  ?Head:  Normocephalic and atraumatic. ?Eyes:  Without icterus ?Ears:  Normal auditory acuity. ?Lungs:  Clear to auscultation bilaterally.  ?Heart:

## 2021-11-03 NOTE — Patient Instructions (Signed)
I have refilled omeprazole for you! ? ?We are arranging a colonoscopy, upper endoscopy, and dilation in the near future! ? ?Further recommendations to follow! ? ?It was a pleasure to see you today. I want to create trusting relationships with patients to provide genuine, compassionate, and quality care. I value your feedback. If you receive a survey regarding your visit,  I greatly appreciate you taking time to fill this out.  ? ?Annitta Needs, PhD, ANP-BC ?[facility]  ? ?

## 2021-11-09 ENCOUNTER — Telehealth: Payer: Self-pay | Admitting: *Deleted

## 2021-11-09 NOTE — Telephone Encounter (Signed)
LMOVM to call back to schedule TCS/EGD/DIL w/ propofol asa 2, Dr. Gala Romney ?

## 2021-11-10 ENCOUNTER — Ambulatory Visit
Admission: RE | Admit: 2021-11-10 | Discharge: 2021-11-10 | Disposition: A | Discharge status: Home / Self Care | Payer: BC Managed Care – PPO | Source: Ambulatory Visit | Attending: Family Medicine | Admitting: Family Medicine

## 2021-11-10 VITALS — BP 124/81 | HR 82 | Temp 99.4°F | Resp 16

## 2021-11-10 DIAGNOSIS — N390 Urinary tract infection, site not specified: Secondary | ICD-10-CM

## 2021-11-10 LAB — POCT URINALYSIS DIP (MANUAL ENTRY)
Bilirubin, UA: NEGATIVE
Glucose, UA: NEGATIVE mg/dL
Ketones, POC UA: NEGATIVE mg/dL
Nitrite, UA: NEGATIVE
Spec Grav, UA: 1.015 (ref 1.010–1.025)
Urobilinogen, UA: 0.2 E.U./dL
pH, UA: 7 (ref 5.0–8.0)

## 2021-11-10 MED ORDER — PHENAZOPYRIDINE HCL 200 MG PO TABS: 200.0000 mg | ORAL_TABLET | Freq: Three times a day (TID) | ORAL | 0 refills | Status: DC | PRN

## 2021-11-10 MED ORDER — NITROFURANTOIN MONOHYD MACRO 100 MG PO CAPS: 100.0000 mg | ORAL_CAPSULE | Freq: Two times a day (BID) | ORAL | 0 refills | Status: DC

## 2021-11-10 NOTE — ED Provider Notes (Signed)
?[facility] ? ? ? ?CSN: [id number] ?Arrival date & time: 11/10/21  1753 ? ? ?  ? ?History   ?Chief Complaint ?Chief Complaint  ?Patient presents with  ? Urinary Frequency  ?  Uti - Entered by patient  ? ? ?HPI ?Isabel Atkinson is a 56 y.o. female.  ? ?Presenting today with 1 day history of dysuria, suprapubic pressure, nausea, flank pain.  Denies vomiting, diarrhea, constipation, hematuria, fever, body aches.  Taking Azo with minimal relief.  History of urinary tract infections that have felt similar.  States she was just treated for suspected urinary tract infection about a month ago with cefdinir and did not tolerated the antibiotic well.  ? ? ?Past Medical History:  ?Diagnosis Date  ? Anxiety   ? COPD (chronic obstructive pulmonary disease) ([place])   ? GERD (gastroesophageal reflux disease)   ? Hypercholesterolemia   ? Pre-diabetes   ? Reactive airway disease   ? Tachycardia   ? ? ?Patient Active Problem List  ? Diagnosis Date Noted  ? Encounter for screening colonoscopy 11/03/2021  ? COVID-19 virus infection 08/21/2020  ? Suspected closed fracture of rib of right side 07/14/2020  ? Right upper quadrant abdominal pain 07/14/2020  ? Need for vaccination 07/14/2020  ? Urinary frequency 07/14/2020  ? Rib contusion, right, initial encounter 07/14/2020  ? Acute rhinosinusitis 06/23/2020  ? Coronary artery disease involving native heart without angina pectoris 02/26/2020  ? Aortic atherosclerosis ([place]) 02/26/2020  ? Current smoker 12/04/2019  ? 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 COPD (chronic obstructive pulmonary disease) ([place]) 06/10/2016  ? Abdominal pain, epigastric 05/04/2016  ? Gastritis 04/25/2015  ? Osteoporosis 07/20/2014  ? Irritable bowel syndrome 07/05/2014  ? Nausea 01/11/2012  ? Esophageal dysphagia 01/11/2012  ? LUQ pain 01/11/2012  ? GERD (gastroesophageal reflux disease) 01/11/2012  ? Chronic diarrhea 01/11/2012  ? ?Past Surgical History:  ?Procedure Laterality Date  ? CATARACT EXTRACTION W/PHACO  Left 01/17/2017  ? Procedure: CATARACT EXTRACTION PHACO AND INTRAOCULAR LENS PLACEMENT (IOC);  Surgeon: Tonny Branch, MD;  Location: [facility];  Service: Ophthalmology;  Laterality: Left;  CDE: 6.92  ? CATARACT EXTRACTION W/PHACO Right 03/07/2020  ? Procedure: CATARACT EXTRACTION PHACO AND INTRAOCULAR LENS PLACEMENT RIGHT EYE;  Surgeon: Baruch Goldmann, MD;  Location: [facility];  Service: Ophthalmology;  Laterality: Right;  CDE: 8.44  ? COLONOSCOPY  2013  ? RMR: Normal rectum. single diminutive sigmoid polyp noted above. Normal terminal ileum. Status post segmental biopsy. next TCS in 10 years.   ? ELBOW SURGERY Right 2023  ? ESOPHAGOGASTRODUODENOSCOPY  2013  ? RMR: Possible cervical esophageal web-status post dilation as described above. Small hiatal hernia. Status post biopsy of normal appearing small bowel to screen for celiac disease.   ? ESOPHAGOGASTRODUODENOSCOPY N/A 05/06/2016  ? normal esophagus, small hiatal hernia, empiric dilation.  ? EYE SURGERY Bilateral   ? Cat Sx - Symfony MF IOLs  ? MALONEY DILATION N/A 05/06/2016  ? Procedure: MALONEY DILATION;  Surgeon: Daneil Dolin, MD;  [facility]: [facility];  Service: Endoscopy;  Laterality: N/A;  ? NECK SURGERY  10/2012  ? OVARIAN CYST SURGERY  1997  ? removed  ? TUBAL LIGATION  2000  ? YAG LASER APPLICATION Bilateral   ? ?OB History   ?No obstetric history on file. ?  ? ?Home Medications   ? ?Prior to Admission medications   ?Medication Sig Start Date End Date Taking? Authorizing Provider  ?nitrofurantoin, macrocrystal-monohydrate, (MACROBID) 100 MG capsule Take 1 capsule (100 mg total)  by mouth 2 (two) times daily. 11/10/21  Yes Volney American, PA-C  ?phenazopyridine (PYRIDIUM) 200 MG tablet Take 1 tablet (200 mg total) by mouth 3 (three) times daily as needed for pain. 11/10/21  Yes Volney American, PA-C  ?acetaminophen (TYLENOL) 500 MG tablet Take 500 mg by mouth every 6 (six) hours as needed for moderate pain or headache.    [provider]   ?albuterol (VENTOLIN HFA) 108 (90 Base) MCG/ACT inhaler Inhale 2 puffs into the lungs every 6 (six) hours as needed for wheezing or shortness of breath. 08/21/20   Chalmers Guest, NP  ?ALPRAZolam (XANAX) 0.25 MG tablet TAKE 1/2 TO 1 TABLET BY MOUTH TWICE DAILY AS NEEDED FOR ANXIETY. ?Patient taking differently: Take 0.625 mg by mouth daily as needed for anxiety. 02/04/20   Kathyrn Drown, MD  ?Ascorbic Acid (VITAMIN C PO) Take 1 tablet by mouth daily. With elderberry    [provider]  ?aspirin EC 81 MG tablet Take 81 mg by mouth daily. Swallow whole.    [provider]  ?CALCIUM PO Take 750 mg by mouth 2 (two) times daily with a meal.    [provider]  ?cholecalciferol (VITAMIN D3) 25 MCG (1000 UNIT) tablet Take 2,000 Units by mouth daily.    [provider]  ?cycloSPORINE (RESTASIS) 0.05 % ophthalmic emulsion Place 1 drop into both eyes 2 (two) times daily.    [provider]  ?denosumab (PROLIA) 60 MG/ML SOSY injection Inject 60 mg into the skin every 6 (six) months.    [provider]  ?EPINEPHRINE 0.3 mg/0.3 mL IJ SOAJ injection INJECT INTO OUTER THIGH AND HOLD AGAINST LEG FOR 10 SECONDS FOR SEVERE ALLERGIC REACTION. 05/11/21   Kathyrn Drown, MD  ?HYDROcodone-acetaminophen (NORCO/VICODIN) 5-325 MG tablet Take 1 tablet by mouth 2 (two) times daily as needed for moderate pain. 06/21/21   Volney American, PA-C  ?ibuprofen (ADVIL) 600 MG tablet Take 1 tablet (600 mg total) by mouth every 6 (six) hours as needed. 07/06/20   Evalee Jefferson, PA-C  ?Multiple Vitamins-Minerals (MULTIVITAMIN WITH MINERALS) tablet Take 1 tablet by mouth daily.     [provider]  ?omeprazole (PRILOSEC) 40 MG capsule Take 1 capsule (40 mg total) by mouth daily. 11/03/21   Annitta Needs, NP  ?prednisoLONE acetate (PRED FORTE) 1 % ophthalmic suspension Place 1 drop into the right eye 4 (four) times daily. 03/13/21   Bernarda Caffey, MD  ?PROLENSA 0.07 % SOLN INSTILL 1 DROP  INTO THE RIGHT EYE TWICE DAILY. 09/04/21   Bernarda Caffey, MD  ?rosuvastatin (CRESTOR) 5 MG tablet TAKE ONE TABLET BY MOUTH THREE TIMES A WEEK 09/03/21   Kathyrn Drown, MD  ?silver sulfADIAZINE (SILVADENE) 1 % cream Apply BID prn ?Patient taking differently: Apply 1 application. topically 2 (two) times daily as needed (burns). 11/08/19   Kathyrn Drown, MD  ?SIMBRINZA 1-0.2 % SUSP Place 1 drop into the right eye 2 (two) times daily. 04/30/21   [provider]  ?traMADol (ULTRAM) 50 MG tablet tramadol 50 mg tablet    [provider]  ?triamcinolone (NASACORT) 55 MCG/ACT AERO nasal inhaler Place 1 spray into the nose 2 (two) times daily. 09/15/21   Volney American, PA-C  ?Triamcinolone Acetonide (NASACORT ALLERGY 24HR NA) Place 2 sprays into the nose daily.    [provider]  ?Vaginal Lubricant ([place]) GEL Place 1 applicator vaginally every 3 (three) days. 10/27/21 11/26/21  Ameduite, Trenton Gammon, NP  ? ?  Family History ?Family History  ?Problem Relation Age of Onset  ? Colon cancer Maternal Grandfather 68  ? Colon polyps Mother 76  ? Colon cancer Maternal Uncle 75  ? Hypertension Father   ? Diabetes Father   ? Heart disease Father   ? Hyperlipidemia Father   ? Stroke Father   ? ? ?Social History ?Social History  ? ?Tobacco Use  ? Smoking status: Every Day  ?  Packs/day: 1.00  ?  Years: 25.00  ?  Pack years: 25.00  ?  Types: Cigarettes  ? Smokeless tobacco: Never  ?Vaping Use  ? Vaping Use: Never used  ?Substance Use Topics  ? Alcohol use: No  ?  Alcohol/week: 0.0 standard drinks  ? Drug use: No  ? ? ? ?Allergies   ?Ciprofloxacin, Augmentin [amoxicillin-pot clavulanate], Carafate [sucralfate], Celexa [citalopram hydrobromide], Chantix [varenicline], Sulfa antibiotics, Wellbutrin [bupropion], and Fosamax [alendronate sodium] ? ? ?Review of Systems ?Review of Systems ?Per HPI ? ?Physical Exam ?Triage Vital Signs ?ED Triage Vitals  ?Enc Vitals Group  ?   BP 11/10/21 1842 124/81  ?   Pulse Rate  11/10/21 1842 82  ?   Resp 11/10/21 1842 16  ?   Temp 11/10/21 1842 99.4 ?F (37.4 ?C)  ?   Temp Source 11/10/21 1842 Oral  ?   SpO2 11/10/21 1842 97 %  ?   Weight --   ?   Height --   ?   Head Circumfer

## 2021-11-10 NOTE — ED Triage Notes (Signed)
Pt states she thinks she has a UTI because it burns bad when she urinates, lower back hurts and nauseated ? ?Denies Fever ? ?Pt AZO maximum strength last dose yesterday ?

## 2021-11-13 LAB — URINE CULTURE: Culture: >=100000 — AB

## 2021-11-16 ENCOUNTER — Encounter (INDEPENDENT_AMBULATORY_CARE_PROVIDER_SITE_OTHER): Payer: Self-pay | Admitting: Ophthalmology

## 2021-11-16 ENCOUNTER — Telehealth: Payer: Self-pay | Admitting: Internal Medicine

## 2021-11-16 ENCOUNTER — Ambulatory Visit (INDEPENDENT_AMBULATORY_CARE_PROVIDER_SITE_OTHER): Payer: BC Managed Care – PPO | Admitting: Ophthalmology

## 2021-11-16 DIAGNOSIS — H35371 Puckering of macula, right eye: Secondary | ICD-10-CM | POA: Diagnosis not present

## 2021-11-16 DIAGNOSIS — H35351 Cystoid macular degeneration, right eye: Secondary | ICD-10-CM

## 2021-11-16 DIAGNOSIS — Z961 Presence of intraocular lens: Secondary | ICD-10-CM | POA: Diagnosis not present

## 2021-11-16 DIAGNOSIS — H43821 Vitreomacular adhesion, right eye: Secondary | ICD-10-CM | POA: Diagnosis not present

## 2021-11-16 DIAGNOSIS — H3581 Retinal edema: Secondary | ICD-10-CM

## 2021-11-16 NOTE — Progress Notes (Signed)
?[facility] Note ? ?11/16/2021 ? ?  ? ?CHIEF COMPLAINT ?Patient presents for Retina Follow Up ? ?HISTORY OF PRESENT ILLNESS: ?Isabel Atkinson is a 56 y.o. female who presents to the clinic today for:  ? ?HPI   ? ? Retina Follow Up   ?Patient presents with  Other.  In right eye.  This started 3 months ago.  I, the attending physician,  performed the HPI with the patient and updated documentation appropriately. ? ?  ?  ? ? 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 Patient states vision good. No eye pain.  ? ?  ?  ?Last edited by Bernarda Caffey, MD on 11/16/2021  4:03 PM.  ?  ?Pt states no change in vision, she is using Prolensa BID ? ?Referring physician: ?Baruch Goldmann, MD ?[address], [address] ?[place], [place]  [address] ? ?HISTORICAL INFORMATION:  ? ?Selected notes from the [facility] ?Referred by Dr. Marisa Hua ?LEE: 05.02.22 ?Ocular Hx- VMT OD, PCIOL OU, YAG OU, Ocular HTN, Steroid Responder ?BCVA: OD 20/80+, OS 20/20  ? ?CURRENT MEDICATIONS: ?Current Outpatient Medications (Ophthalmic Drugs)  ?Medication Sig  ? cycloSPORINE (RESTASIS) 0.05 % ophthalmic emulsion Place 1 drop into both eyes 2 (two) times daily.  ? prednisoLONE acetate (PRED FORTE) 1 % ophthalmic suspension Place 1 drop into the right eye 4 (four) times daily.  ? PROLENSA 0.07 % SOLN INSTILL 1 DROP INTO THE RIGHT EYE TWICE DAILY.  ? SIMBRINZA 1-0.2 % SUSP Place 1 drop into the right eye 2 (two) times daily.  ? ?No current facility-administered medications for this visit. (Ophthalmic Drugs)  ? ?Current Outpatient Medications (Other)  ?Medication Sig  ? acetaminophen (TYLENOL) 500 MG tablet Take 500 mg by mouth every 6 (six) hours as needed for moderate pain or headache.  ? albuterol (VENTOLIN HFA) 108 (90 Base) MCG/ACT inhaler Inhale 2 puffs into the lungs every 6 (six) hours as needed for wheezing or shortness of breath.  ? ALPRAZolam (XANAX) 0.25 MG tablet TAKE 1/2 TO  1 TABLET BY MOUTH TWICE DAILY AS NEEDED FOR ANXIETY. (Patient taking differently: Take 0.625 mg by mouth daily as needed for anxiety.)  ? Ascorbic Acid (VITAMIN C PO) Take 1 tablet by mouth daily. With elderberry  ? aspirin EC 81 MG tablet Take 81 mg by mouth daily. Swallow whole.  ? CALCIUM PO Take 750 mg by mouth 2 (two) times daily with a meal.  ? cholecalciferol (VITAMIN D3) 25 MCG (1000 UNIT) tablet Take 2,000 Units by mouth daily.  ? denosumab (PROLIA) 60 MG/ML SOSY injection Inject 60 mg into the skin every 6 (six) months.  ? EPINEPHRINE 0.3 mg/0.3 mL IJ SOAJ injection INJECT INTO OUTER THIGH AND HOLD AGAINST LEG FOR 10 SECONDS FOR SEVERE ALLERGIC REACTION.  ? HYDROcodone-acetaminophen (NORCO/VICODIN) 5-325 MG tablet Take 1 tablet by mouth 2 (two) times daily as needed for moderate pain.  ? ibuprofen (ADVIL) 600 MG tablet Take 1 tablet (600 mg total) by mouth every 6 (six) hours as needed.  ? Multiple Vitamins-Minerals (MULTIVITAMIN WITH MINERALS) tablet Take 1 tablet by mouth daily.   ? nitrofurantoin, macrocrystal-monohydrate, (MACROBID) 100 MG capsule Take 1 capsule (100 mg total) by mouth 2 (two) times daily.  ? omeprazole (PRILOSEC) 40 MG capsule Take 1 capsule (40 mg total) by mouth daily.  ? phenazopyridine (PYRIDIUM) 200 MG tablet Take 1 tablet (200 mg total) by mouth 3 (three) times  daily as needed for pain.  ? rosuvastatin (CRESTOR) 5 MG tablet TAKE ONE TABLET BY MOUTH THREE TIMES A WEEK  ? silver sulfADIAZINE (SILVADENE) 1 % cream Apply BID prn (Patient taking differently: Apply 1 application. topically 2 (two) times daily as needed (burns).)  ? traMADol (ULTRAM) 50 MG tablet tramadol 50 mg tablet  ? triamcinolone (NASACORT) 55 MCG/ACT AERO nasal inhaler Place 1 spray into the nose 2 (two) times daily.  ? Triamcinolone Acetonide (NASACORT ALLERGY 24HR NA) Place 2 sprays into the nose daily.  ? Vaginal Lubricant (REPHRESH) GEL Place 1 applicator vaginally every 3 (three) days.  ? ?No current  facility-administered medications for this visit. (Other)  ? ?REVIEW OF SYSTEMS: ?ROS   ?Positive for: Gastrointestinal, Cardiovascular, Eyes ?Negative for: Constitutional, Neurological, Skin, Genitourinary, Musculoskeletal, HENT, Endocrine, Respiratory, Psychiatric, Allergic/Imm, Heme/Lymph ?Last edited by Theodore Demark, COA on 11/16/2021  2:58 PM.  ?  ? ? ?ALLERGIES ?Allergies  ?Allergen Reactions  ? Ciprofloxacin Other (See Comments)  ?  Patient states with in five minutes of taking developed a splitting headache,itching all over and burning in mouth and lips.  ? Augmentin [Amoxicillin-Pot Clavulanate] Diarrhea  ? Carafate [Sucralfate]   ?  Mouth tingling  ? Celexa [Citalopram Hydrobromide] Nausea And Vomiting  ? Chantix [Varenicline] Nausea And Vomiting  ? Sulfa Antibiotics Itching  ? Wellbutrin [Bupropion]   ?  Severe weight loss and loss of appetite  ? Fosamax [Alendronate Sodium] Nausea And Vomiting  ?  GERD can not tolerate  ? ?PAST MEDICAL HISTORY ?Past Medical History:  ?Diagnosis Date  ? Anxiety   ? COPD (chronic obstructive pulmonary disease) ([place])   ? GERD (gastroesophageal reflux disease)   ? Hypercholesterolemia   ? Pre-diabetes   ? Reactive airway disease   ? Tachycardia   ? ?Past Surgical History:  ?Procedure Laterality Date  ? CATARACT EXTRACTION W/PHACO Left 01/17/2017  ? Procedure: CATARACT EXTRACTION PHACO AND INTRAOCULAR LENS PLACEMENT (IOC);  Surgeon: Tonny Branch, MD;  Location: [facility];  Service: Ophthalmology;  Laterality: Left;  CDE: 6.92  ? CATARACT EXTRACTION W/PHACO Right 03/07/2020  ? Procedure: CATARACT EXTRACTION PHACO AND INTRAOCULAR LENS PLACEMENT RIGHT EYE;  Surgeon: Baruch Goldmann, MD;  Location: [facility];  Service: Ophthalmology;  Laterality: Right;  CDE: 8.44  ? COLONOSCOPY  2013  ? RMR: Normal rectum. single diminutive sigmoid polyp noted above. Normal terminal ileum. Status post segmental biopsy. next TCS in 10 years.   ? ELBOW SURGERY Right 2023  ? ESOPHAGOGASTRODUODENOSCOPY   2013  ? RMR: Possible cervical esophageal web-status post dilation as described above. Small hiatal hernia. Status post biopsy of normal appearing small bowel to screen for celiac disease.   ? ESOPHAGOGASTRODUODENOSCOPY N/A 05/06/2016  ? normal esophagus, small hiatal hernia, empiric dilation.  ? EYE SURGERY Bilateral   ? Cat Sx - Symfony MF IOLs  ? MALONEY DILATION N/A 05/06/2016  ? Procedure: MALONEY DILATION;  Surgeon: Daneil Dolin, MD;  Location: [facility];  Service: Endoscopy;  Laterality: N/A;  ? NECK SURGERY  10/2012  ? OVARIAN CYST SURGERY  1997  ? removed  ? TUBAL LIGATION  2000  ? YAG LASER APPLICATION Bilateral   ? ?FAMILY HISTORY ?Family History  ?Problem Relation Age of Onset  ? Colon cancer Maternal Grandfather 37  ? Colon polyps Mother 50  ? Colon cancer Maternal Uncle 78  ? Hypertension Father   ? Diabetes Father   ? Heart disease Father   ? Hyperlipidemia Father   ?  Stroke Father   ? ?SOCIAL HISTORY ?Social History  ? ?Tobacco Use  ? Smoking status: Every Day  ?  Packs/day: 1.00  ?  Years: 25.00  ?  Pack years: 25.00  ?  Types: Cigarettes  ? Smokeless tobacco: Never  ?Vaping Use  ? Vaping Use: Never used  ?Substance Use Topics  ? Alcohol use: No  ?  Alcohol/week: 0.0 standard drinks  ? Drug use: No  ?  ? ?  ?OPHTHALMIC EXAM: ? ?Base Eye Exam   ? ? Visual Acuity (Snellen - Linear)   ? ?   Right Left  ? Dist [place] 20/25 -2 20/20  ? Dist ph [place] NI   ? ?  ?  ? ? Tonometry (Tonopen, 2:55 PM)   ? ?   Right Left  ? Pressure 16 16  ? ?  ?  ? ? Pupils   ? ?   Dark Light Shape React APD  ? Right 3 2 Round Brisk None  ? Left 3 2 Round Brisk None  ? ?  ?  ? ? Visual Fields (Counting fingers)   ? ?   Left Right  ?  Full Full  ? ?  ?  ? ? Extraocular Movement   ? ?   Right Left  ?  Full, Ortho Full, Ortho  ? ?  ?  ? ? Neuro/Psych   ? ? Oriented x3: Yes  ? Mood/Affect: Normal  ? ?  ?  ? ? Dilation   ? ? Both eyes: 1.0% Mydriacyl, 2.5% Phenylephrine @ 2:55 PM  ? ?  ?  ? ?  ? ?Slit Lamp and Fundus Exam   ? ? Slit  Lamp Exam   ? ?   Right Left  ? Lids/Lashes Dermatochalasis - upper lid, mild MGD Dermatochalasis - upper lid, mild MGD  ? Conjunctiva/Sclera nasal and temporal pinguecula, Conjunctivochalasis IT nasal and

## 2021-11-16 NOTE — Telephone Encounter (Signed)
Pt returning call to schedule procedure. I transferred to Mindy's VM.  ?

## 2021-11-16 NOTE — Telephone Encounter (Signed)
Called pt, she would like to wait until June to do TCS/EGD/DIL w/Dr. Gala Romney. She recently went back to work and will be getting out for summer 01/12/2022. Advised her we will call her when June schedule is available. ?

## 2021-11-17 ENCOUNTER — Ambulatory Visit (INDEPENDENT_AMBULATORY_CARE_PROVIDER_SITE_OTHER): Payer: BC Managed Care – PPO | Admitting: Family Medicine

## 2021-11-17 ENCOUNTER — Ambulatory Visit (HOSPITAL_COMMUNITY)
Admission: RE | Admit: 2021-11-17 | Discharge: 2021-11-17 | Disposition: A | Discharge status: Home / Self Care | Payer: BC Managed Care – PPO | Source: Ambulatory Visit | Attending: Family Medicine | Admitting: Family Medicine

## 2021-11-17 VITALS — BP 130/72 | HR 84 | Temp 98.4°F | Ht 62.0 in | Wt 101.8 lb

## 2021-11-17 DIAGNOSIS — R319 Hematuria, unspecified: Secondary | ICD-10-CM | POA: Diagnosis present

## 2021-11-17 DIAGNOSIS — R300 Dysuria: Secondary | ICD-10-CM | POA: Diagnosis not present

## 2021-11-17 LAB — POCT URINALYSIS DIPSTICK
Bilirubin, UA: NEGATIVE
Glucose, UA: NEGATIVE
Ketones, UA: NEGATIVE
Leukocytes, UA: NEGATIVE
Nitrite, UA: NEGATIVE
Protein, UA: POSITIVE — AB
Spec Grav, UA: 1.02 (ref 1.010–1.025)
Urobilinogen, UA: 0.2 E.U./dL
pH, UA: 6.5 (ref 5.0–8.0)

## 2021-11-17 MED ORDER — CEFDINIR 300 MG PO CAPS: 300.0000 mg | ORAL_CAPSULE | Freq: Two times a day (BID) | ORAL | 0 refills | Status: DC

## 2021-11-17 NOTE — Progress Notes (Addendum)
? ?Subjective:  ?Patient ID: Isabel Atkinson, female    DOB: 07/08/1966  Age: 56 y.o. MRN: [id number] ? ?CC: ?Chief Complaint  ?Patient presents with  ? Dysuria  ? Fever  ? ? ?HPI: ? ?56 year old female presents with the above complaints. ? ?Patient recently went to urgent care on 4/4.  Diagnosed with UTI.  Culture grew out E. coli.  She was treated with Macrobid.  Patient states that after she was seen she developed worsening pain and fever.  She is now experiencing flank pain/back pain and continues to have dysuria.  She has completed the antibiotic course.  She states that the pain has been quite severe requiring her to take pain medication.  She is currently afebrile.  Has a history of kidney stone. ? ?Patient Active Problem List  ? Diagnosis Date Noted  ? Hematuria 11/17/2021  ? Encounter for screening colonoscopy 11/03/2021  ? Coronary artery disease involving native heart without angina pectoris 02/26/2020  ? Aortic atherosclerosis ([place]) 02/26/2020  ? Current smoker 12/04/2019  ? 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 COPD (chronic obstructive pulmonary disease) ([place]) 06/10/2016  ? Osteoporosis 07/20/2014  ? Irritable bowel syndrome 07/05/2014  ? Esophageal dysphagia 01/11/2012  ? GERD (gastroesophageal reflux disease) 01/11/2012  ? Chronic diarrhea 01/11/2012  ? ? ?Social Hx   ?Social History  ? ?Socioeconomic History  ? Marital status: Married  ?  Spouse name: Not on file  ? Number of children: 2  ? Years of education: GED  ? Highest education level: Not on file  ?Occupational History  ? 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  Employer: [employer]  ?Tobacco Use  ? Smoking status: Every Day  ?  Packs/day: 1.00  ?  Years: 25.00  ?  Pack years: 25.00  ?  Types: Cigarettes  ? Smokeless tobacco: Never  ?Vaping Use  ? Vaping Use: Never used  ?Substance and Sexual Activity  ? Alcohol use: No  ?  Alcohol/week: 0.0 standard drinks  ? Drug use: No  ? Sexual activity: Yes  ?  Birth control/protection: Post-menopausal,  Surgical  ?Other Topics Concern  ? Not on file  ?Social History Narrative  ? Lives at home w/ her husband  ? 2 children - ages 15 & 81  ? Right-sided  ? Caffeine: 2 beverages per day  ? ?Social Determinants of Health  ? ?Financial Resource Strain: Not on file  ?Food Insecurity: Not on file  ?Transportation Needs: Not on file  ?Physical Activity: Not on file  ?Stress: Not on file  ?Social Connections: Not on file  ? ? ?Review of Systems ?Per HPI ? ?Objective:  ?BP 130/72   Pulse 84   Temp 98.4 ?F (36.9 ?C) (Oral)   Ht '5\' 2"'$  (1.575 m)   Wt 101 lb 12.8 oz (46.2 kg)   SpO2 98%   BMI 18.62 kg/m?  ? ? ?  11/17/2021  ?  2:42 PM 11/10/2021  ?  6:42 PM 11/03/2021  ?  3:05 PM  ?BP/Weight  ?Systolic BP 734 193 790  ?Diastolic BP 72 81 64  ?Wt. (Lbs) 101.8  102  ?BMI 18.62 kg/m2  18.66 kg/m2  ? ? ?Physical Exam ?Vitals and nursing note reviewed.  ?Constitutional:   ?   General: She is not in acute distress. ?   Appearance: She is not ill-appearing.  ?HENT:  ?   Head: Normocephalic and atraumatic.  ?Cardiovascular:  ?   Rate and Rhythm: Normal rate and regular rhythm.  ?Pulmonary:  ?  Effort: Pulmonary effort is normal.  ?   Breath sounds: Normal breath sounds. No wheezing, rhonchi or rales.  ?Abdominal:  ?   General: There is no distension.  ?   Palpations: Abdomen is soft.  ?   Tenderness: There is no abdominal tenderness. There is left CVA tenderness.  ?Neurological:  ?   Mental Status: She is alert.  ?Psychiatric:     ?   Mood and Affect: Mood normal.     ?   Behavior: Behavior normal.  ? ? ?Lab Results  ?Component Value Date  ? WBC 6.7 01/11/2020  ? HGB 13.4 01/11/2020  ? HCT 40.2 01/11/2020  ? PLT 299 01/11/2020  ? GLUCOSE 85 10/26/2021  ? CHOL 158 09/02/2021  ? TRIG 96 09/02/2021  ? HDL 59 09/02/2021  ? [place] 81 09/02/2021  ? ALT 16 10/26/2021  ? AST 19 10/26/2021  ? NA 141 10/26/2021  ? K 3.7 10/26/2021  ? CL 104 10/26/2021  ? CREATININE 0.58 10/26/2021  ? BUN 14 10/26/2021  ? CO2 23 10/26/2021  ? TSH 1.310  10/23/2020  ? ? ? ?Assessment & Plan:  ? ?Problem List Items Addressed This Visit   ? ?  ? Other  ? Hematuria - Primary  ?  Patient with recent UTI confirmed by culture.  She has completed course of Macrobid.  Hematuria noted today.  No pyuria.  Given CVA tenderness, worsening symptoms, findings of hematuria today I am sending her for CT scan for further evaluation. ? ?**CT returned and was negative.  No evidence of stone.  I am starting patient on antibiotic therapy to ensure no underlying UTI/pyelonephritis.  Placing on Omnicef. ?  ?  ? Relevant Orders  ? CT RENAL STONE STUDY (Completed)  ? ?Other Visit Diagnoses   ? ? Dysuria      ? Relevant Orders  ? Urine Culture  ? POCT urinalysis dipstick (Completed)  ? ?  ? ?Thersa Salt DO ?[place] ? ?

## 2021-11-17 NOTE — Assessment & Plan Note (Addendum)
Patient with recent UTI confirmed by culture.  She has completed course of Macrobid.  Hematuria noted today.  No pyuria.  Given CVA tenderness, worsening symptoms, findings of hematuria today I am sending her for CT scan for further evaluation. ? ?**CT returned and was negative.  No evidence of stone.  I am starting patient on antibiotic therapy to ensure no underlying UTI/pyelonephritis.  Placing on Omnicef. ?

## 2021-11-18 ENCOUNTER — Other Ambulatory Visit (HOSPITAL_COMMUNITY)
Admission: RE | Admit: 2021-11-18 | Discharge: 2021-11-18 | Disposition: A | Discharge status: Home / Self Care | Payer: BC Managed Care – PPO | Source: Ambulatory Visit | Attending: Internal Medicine | Admitting: Internal Medicine

## 2021-11-18 ENCOUNTER — Telehealth: Payer: Self-pay

## 2021-11-18 DIAGNOSIS — I700 Atherosclerosis of aorta: Secondary | ICD-10-CM | POA: Diagnosis present

## 2021-11-18 NOTE — Telephone Encounter (Signed)
Coral Spikes, DO   ? ?I would recommend that she take the [place] (based off her culture, recent use of macrobid and her allergies to multiple other antibiotics I believe this is the best option).  ? ?

## 2021-11-18 NOTE — Telephone Encounter (Signed)
Caller name:Xiamara Haverstick  ? ?On DPR? :Yes ? ?Call back number:[phone] ? ?Provider they see: Wolfgang Phoenix  ? ?Reason for call:Pt was seeing Dr Lacinda Axon yesterday and was prescribed cefdinir (OMNICEF) 300 MG capsule  pt gets diarrhea from this medication and wants to know if he can change to a different medication  pt uses [facility]  ? ?

## 2021-11-18 NOTE — Telephone Encounter (Signed)
Pt returned call and verbalized understanding  

## 2021-11-18 NOTE — Telephone Encounter (Signed)
Left message to return call 

## 2021-11-19 ENCOUNTER — Telehealth: Payer: Self-pay | Admitting: Family Medicine

## 2021-11-19 ENCOUNTER — Other Ambulatory Visit: Payer: Self-pay | Admitting: *Deleted

## 2021-11-19 LAB — LIPOPROTEIN A (LPA): Lipoprotein (a): <8.4 nmol/L (ref <75.0)

## 2021-11-19 NOTE — Telephone Encounter (Signed)
May have 3 refills 

## 2021-11-19 NOTE — Addendum Note (Signed)
Addended by: Madelin Rear on: 11/19/2021 01:30 PM ? ? Modules accepted: Orders ? ?

## 2021-11-19 NOTE — Telephone Encounter (Signed)
Called refills into [place]. ?

## 2021-11-19 NOTE — Telephone Encounter (Signed)
Patient stated she likes to keep it on hand at the house to use if she gets the little ulcers in her mouth from time to time ?

## 2021-11-19 NOTE — Telephone Encounter (Signed)
Refill request faxed over from [place] for [employer]; swish and spit with 5 ml po 4 times daily for 7 days.  ?Attempted to contact patient to see why refill was requested. Left message to return call. Refill request fax at nurses station.  ?

## 2021-11-20 LAB — MISC LABCORP TEST (SEND OUT): Labcorp test code: 123810

## 2021-11-24 ENCOUNTER — Encounter: Payer: Self-pay | Admitting: Family Medicine

## 2021-11-24 ENCOUNTER — Ambulatory Visit (INDEPENDENT_AMBULATORY_CARE_PROVIDER_SITE_OTHER): Payer: BC Managed Care – PPO | Admitting: Family Medicine

## 2021-11-24 VITALS — BP 114/72 | HR 77 | Temp 97.9°F | Ht 62.0 in | Wt 101.0 lb

## 2021-11-24 DIAGNOSIS — R300 Dysuria: Secondary | ICD-10-CM | POA: Diagnosis not present

## 2021-11-24 DIAGNOSIS — I700 Atherosclerosis of aorta: Secondary | ICD-10-CM | POA: Diagnosis not present

## 2021-11-24 DIAGNOSIS — N23 Unspecified renal colic: Secondary | ICD-10-CM

## 2021-11-24 DIAGNOSIS — R1032 Left lower quadrant pain: Secondary | ICD-10-CM | POA: Diagnosis not present

## 2021-11-24 LAB — POCT URINALYSIS DIPSTICK
Bilirubin, UA: NEGATIVE
Glucose, UA: NEGATIVE
Ketones, UA: NEGATIVE
Nitrite, UA: NEGATIVE
Protein, UA: NEGATIVE
Spec Grav, UA: 1.015 (ref 1.010–1.025)
Urobilinogen, UA: 0.2 E.U./dL
pH, UA: 6.5 (ref 5.0–8.0)

## 2021-11-24 MED ORDER — ROSUVASTATIN CALCIUM 10 MG PO TABS: ORAL_TABLET | ORAL | 6 refills | Status: DC

## 2021-11-24 MED ORDER — OXYCODONE-ACETAMINOPHEN 5-325 MG PO TABS: 1.0000 | ORAL_TABLET | Freq: Four times a day (QID) | ORAL | 0 refills | Status: DC | PRN

## 2021-11-24 MED ORDER — IBUPROFEN 600 MG PO TABS: 600.0000 mg | ORAL_TABLET | Freq: Four times a day (QID) | ORAL | 0 refills | Status: DC | PRN

## 2021-11-24 NOTE — Patient Instructions (Signed)
Use probiotic ? ?Hardin Negus colon health ? ? ? ?

## 2021-11-24 NOTE — Progress Notes (Signed)
? ?  Subjective:  ? ? Patient ID: Isabel Atkinson, female    DOB: Oct 06, 1965, 56 y.o.   MRN: [id number] ? ?HPI ? ?Patient seen on 11/17/21 for hematuria.  Patient is still having dysuria issues. ?Patient with ongoing left lower quadrant discomfort left back pain and flank pain that she states feels like a kidney stone she has had previous visits for UTI plus also had a CT scan which did not show any stone but she has persistent pain and discomfort denies fever chills currently no vomiting or diarrhea or mucousy stools or bleeding ?Review of Systems ? ?   ?Objective:  ? Physical Exam ? ?Lungs clear respiratory rate normal heart regular abdomen is soft with tenderness in the left lower quadrant no guarding or rebound doubt diverticulitis ? ?Patient currently having diarrhea associated with antibiotics but not having any mucus or blood ?   ?Assessment & Plan:  ?Lower abdominal discomfort left side with some back pain ?Need to discern whether or not there is additional measures going on ?Patient recently had CT renal test ?Not having any hematuria today ?Referral to urology indicated ?Ultrasound indicated ?Culture indicated ?Hold off on antibiotics ?Use probiotics for the antibiotic induced diarrhea ?If the diarrhea not gone by Monday testing for C. difficile is neck step ?Patient strongly encouraged to quit smoking ? ?

## 2021-11-25 ENCOUNTER — Telehealth: Payer: Self-pay

## 2021-11-25 NOTE — Telephone Encounter (Signed)
Please make sure result is sent to [place] thanks ?

## 2021-11-25 NOTE — Telephone Encounter (Signed)
Caller name:Azelie Dagher  ? ?On DPR? :Yes ? ?Call back [phone] ? ?Provider they see: Wolfgang Phoenix  ? ?Reason for call:Pt called she has ultrasound Monday at [facility] she does not want to wait that long and would like this to be sent to [facility] they can get her in tomorrow or Friday depending on when they can receive the information  ? ?

## 2021-11-25 NOTE — Telephone Encounter (Signed)
Contacted patient to see what all information was needed to send to [facility]. Pt states that they need the order and insurance information. Faxed order, demographics and picture of insurance card to [phone] ?

## 2021-11-26 LAB — URINE CULTURE

## 2021-11-26 LAB — SPECIMEN STATUS REPORT

## 2021-11-26 NOTE — Telephone Encounter (Signed)
Pt had been made aware to have [facility] sent results to [place].  ?

## 2021-11-30 ENCOUNTER — Telehealth: Payer: Self-pay | Admitting: Family Medicine

## 2021-11-30 ENCOUNTER — Ambulatory Visit (HOSPITAL_COMMUNITY): Payer: BC Managed Care – PPO

## 2021-11-30 DIAGNOSIS — R1032 Left lower quadrant pain: Secondary | ICD-10-CM

## 2021-11-30 NOTE — Telephone Encounter (Signed)
Patient would like results of ultra sounds. ?

## 2021-11-30 NOTE — Telephone Encounter (Signed)
Left message to return call 

## 2021-11-30 NOTE — Telephone Encounter (Signed)
Ultrasound is normal.  No acute findings.  No sign of hydronephrosis.  This points away from a kidney stone.  How is patient currently?  What type of symptoms is she having?  Also does she have a follow-up visit with urology at this point? ?

## 2021-12-02 NOTE — Telephone Encounter (Signed)
May do CBC, lipid liver lipase due to abdominal pain ?Nurses-follow-up on referral ?

## 2021-12-02 NOTE — Telephone Encounter (Signed)
Cancel lipid, meant to say met 7 thank you ?

## 2021-12-02 NOTE — Telephone Encounter (Signed)
Blood work ordered in [facility]. Referral coordinator stated the patient's referral was sent to Urology in [place] and patient can call [phone] to check on appointment. Left message to return call ?

## 2021-12-02 NOTE — Telephone Encounter (Signed)
Patient informed of results of ultrasound. Patient says she is feeling better. She says left side is hurting but not like it was. She wants to know if you feel the need to do blood work. Hasn't heard from urology at this time, but she says her phone was down yesterday.  Needs a work note for Thursday & Friday of last week and Monday of this week. ?

## 2021-12-03 NOTE — Telephone Encounter (Signed)
Pt returned call. Pt has appt wit Urology on 12/24/21. States she is still not feeling well. Pt verbalized understanding.  ?

## 2021-12-04 LAB — CBC WITH DIFFERENTIAL/PLATELET
Basophils Absolute: 0 10*3/uL (ref 0.0–0.2)
Basos: 0 %
EOS (ABSOLUTE): 0.1 10*3/uL (ref 0.0–0.4)
Eos: 1 %
Hematocrit: 39.1 % (ref 34.0–46.6)
Hemoglobin: 13 g/dL (ref 11.1–15.9)
Immature Grans (Abs): 0 10*3/uL (ref 0.0–0.1)
Immature Granulocytes: 0 %
Lymphocytes Absolute: 2.6 10*3/uL (ref 0.7–3.1)
Lymphs: 33 %
MCH: 29.8 pg (ref 26.6–33.0)
MCHC: 33.2 g/dL (ref 31.5–35.7)
MCV: 90 fL (ref 79–97)
Monocytes Absolute: 0.4 10*3/uL (ref 0.1–0.9)
Monocytes: 5 %
Neutrophils Absolute: 4.7 10*3/uL (ref 1.4–7.0)
Neutrophils: 61 %
Platelets: 277 10*3/uL (ref 150–450)
RBC: 4.36 x10E6/uL (ref 3.77–5.28)
RDW: 12.3 % (ref 11.7–15.4)
WBC: 7.9 10*3/uL (ref 3.4–10.8)

## 2021-12-04 LAB — BASIC METABOLIC PANEL
BUN/Creatinine Ratio: 28 — ABNORMAL HIGH (ref 9–23)
BUN: 15 mg/dL (ref 6–24)
CO2: 24 mmol/L (ref 20–29)
Calcium: 9.4 mg/dL (ref 8.7–10.2)
Chloride: 106 mmol/L (ref 96–106)
Creatinine, Ser: 0.53 mg/dL — ABNORMAL LOW (ref 0.57–1.00)
Glucose: 107 mg/dL — ABNORMAL HIGH (ref 70–99)
Potassium: 3.5 mmol/L (ref 3.5–5.2)
Sodium: 142 mmol/L (ref 134–144)
eGFR: 108 mL/min/{1.73_m2} (ref >59)

## 2021-12-04 LAB — HEPATIC FUNCTION PANEL
ALT: 13 IU/L (ref 0–32)
AST: 16 IU/L (ref 0–40)
Albumin: 4.6 g/dL (ref 3.8–4.9)
Alkaline Phosphatase: 58 IU/L (ref 44–121)
Bilirubin Total: 0.3 mg/dL (ref 0.0–1.2)
Bilirubin, Direct: <0.1 mg/dL (ref 0.00–0.40)
Total Protein: 6.5 g/dL (ref 6.0–8.5)

## 2021-12-04 LAB — LIPASE: Lipase: 14 U/L (ref 14–72)

## 2021-12-07 ENCOUNTER — Telehealth: Payer: Self-pay | Admitting: Internal Medicine

## 2021-12-07 DIAGNOSIS — Z79899 Other long term (current) drug therapy: Secondary | ICD-10-CM

## 2021-12-07 DIAGNOSIS — E785 Hyperlipidemia, unspecified: Secondary | ICD-10-CM

## 2021-12-07 DIAGNOSIS — I700 Atherosclerosis of aorta: Secondary | ICD-10-CM

## 2021-12-07 MED ORDER — ROSUVASTATIN CALCIUM 10 MG PO TABS: ORAL_TABLET | ORAL | 3 refills | Status: DC

## 2021-12-07 NOTE — Telephone Encounter (Signed)
Pt verbalized understanding of her lab results... she will have repeat fasting labs 12/29/21 in [place].  ?

## 2021-12-07 NOTE — Telephone Encounter (Signed)
Patient returning call for lab results. 

## 2021-12-07 NOTE — Telephone Encounter (Signed)
-----   Message from Fay Records, MD sent at 11/22/2021  6:52 PM EDT ----- ?LDL  should be lower given atherosclerosis on CT ?Recomm increasing Crestor to 10 mg  ?Recheck  lipomed in August ?

## 2021-12-17 NOTE — Telephone Encounter (Signed)
LMOVM to call back to schedule 

## 2021-12-18 MED ORDER — PEG 3350-KCL-NA BICARB-NACL 420 G PO SOLR: ORAL | 0 refills | Status: DC

## 2021-12-18 NOTE — Telephone Encounter (Signed)
Spoke with pt. She has been scheduled for 6/21 at 12:45pm. Aware will mail prep instructions and send prep to pharmacy.  ?

## 2021-12-18 NOTE — Addendum Note (Signed)
Addended by: Cheron Every on: 12/18/2021 10:00 AM ? ? Modules accepted: Orders ? ?

## 2021-12-24 ENCOUNTER — Encounter: Payer: Self-pay | Admitting: Urology

## 2021-12-24 ENCOUNTER — Ambulatory Visit: Payer: BC Managed Care – PPO | Admitting: Urology

## 2021-12-24 VITALS — BP 129/75 | HR 80 | Ht 62.0 in | Wt 96.0 lb

## 2021-12-24 DIAGNOSIS — N390 Urinary tract infection, site not specified: Secondary | ICD-10-CM

## 2021-12-24 DIAGNOSIS — Z87440 Personal history of urinary (tract) infections: Secondary | ICD-10-CM

## 2021-12-24 DIAGNOSIS — R300 Dysuria: Secondary | ICD-10-CM

## 2021-12-24 DIAGNOSIS — R3129 Other microscopic hematuria: Secondary | ICD-10-CM

## 2021-12-24 DIAGNOSIS — Z87442 Personal history of urinary calculi: Secondary | ICD-10-CM | POA: Diagnosis not present

## 2021-12-24 LAB — URINALYSIS, ROUTINE W REFLEX MICROSCOPIC
Bilirubin, UA: NEGATIVE
Glucose, UA: NEGATIVE
Ketones, UA: NEGATIVE
Leukocytes,UA: NEGATIVE
Nitrite, UA: NEGATIVE
Protein,UA: NEGATIVE
Specific Gravity, UA: >1.03 — ABNORMAL HIGH (ref 1.005–1.030)
Urobilinogen, Ur: 0.2 mg/dL (ref 0.2–1.0)
pH, UA: 6 (ref 5.0–7.5)

## 2021-12-24 LAB — MICROSCOPIC EXAMINATION: Renal Epithel, UA: NONE SEEN /hpf

## 2021-12-24 NOTE — Progress Notes (Signed)
Subjective: 1. Dysuria   2. Microhematuria   3. Personal history of urinary infection      Consult requested by Dr. Sallee Lange.  Isabel Atkinson is a 56 yo former patient with a history of stones.  She had left flank pain with a febrile UTI in early  April and it took 3 weeks to clear up.  She had a pan sensitive e. Coli on culture on 11/10/21. She got macrobid for a week and then Cefdinir for a week.   Her pain was severe and has been slow to resolve.  The flank pain has resolved but the back pain persists.   She had no nausea or vomiting.   She has mild irritation with voiding now.   She has 6-10 WBC's, 11-30 RBC's and a few bacteria.  She has chronic microhematuria but it increased with the UTI.  She had a UTI with Klebsiella and Enterococcus in 2021.  She had a negative CT on 11/17/21 and a negative renal US on 11/27/21.  She does have some scoliosis and a suggestion of lumbar DDD.  She has no incontinence. She is a smoker.   ROS:  ROS  Allergies  Allergen Reactions   Ciprofloxacin Other (See Comments)    Patient states with in five minutes of taking developed a splitting headache,itching all over and burning in mouth and lips.   Augmentin [Amoxicillin-Pot Clavulanate] Diarrhea   Carafate [Sucralfate]     Mouth tingling   Celexa [Citalopram Hydrobromide] Nausea And Vomiting   Chantix [Varenicline] Nausea And Vomiting   Sulfa Antibiotics Itching   Wellbutrin [Bupropion]     Severe weight loss and loss of appetite   Fosamax [Alendronate Sodium] Nausea And Vomiting    GERD can not tolerate    Past Medical History:  Diagnosis Date   Anxiety    COPD (chronic obstructive pulmonary disease) (HCC)    GERD (gastroesophageal reflux disease)    Hypercholesterolemia    Pre-diabetes    Reactive airway disease    Tachycardia     Past Surgical History:  Procedure Laterality Date   CATARACT EXTRACTION W/PHACO Left 01/17/2017   Procedure: CATARACT EXTRACTION PHACO AND INTRAOCULAR LENS PLACEMENT  (English);  Surgeon: Tonny Branch, MD;  Location: [facility];  Service: Ophthalmology;  Laterality: Left;  CDE: 6.92   CATARACT EXTRACTION W/PHACO Right 03/07/2020   Procedure: CATARACT EXTRACTION PHACO AND INTRAOCULAR LENS PLACEMENT RIGHT EYE;  Surgeon: Baruch Goldmann, MD;  Location: [facility];  Service: Ophthalmology;  Laterality: Right;  CDE: 8.44   COLONOSCOPY  2013   RMR: Normal rectum. single diminutive sigmoid polyp noted above. Normal terminal ileum. Status post segmental biopsy. next TCS in 10 years.    ELBOW SURGERY Right 2023   ESOPHAGOGASTRODUODENOSCOPY  2013   RMR: Possible cervical esophageal web-status post dilation as described above. Small hiatal hernia. Status post biopsy of normal appearing small bowel to screen for celiac disease.    ESOPHAGOGASTRODUODENOSCOPY N/A 05/06/2016   normal esophagus, small hiatal hernia, empiric dilation.   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    Employer: [employer]  Tobacco Use   Smoking status: Every Day    Packs/day: 1.00    Years: 25.00    Pack years: 25.00    Types: Cigarettes   Smokeless tobacco: Never  Vaping Use   Vaping Use: Never used  Substance and Sexual Activity   Alcohol use: No    Alcohol/week: 0.0 standard drinks   Drug use: No   Sexual activity: Yes    Birth control/protection: Post-menopausal, Surgical  Other Topics Concern   Not on file  Social History Narrative   Lives at home w/  her husband   2 children - ages 77 & 61   Right-sided   Caffeine: 2 beverages per day   Social Determinants of Health   Financial Resource Strain: Not on file  Food Insecurity: Not on file  Transportation Needs: Not on file  Physical Activity: Not on file  Stress: Not on file  Social Connections: Not on file  Intimate Partner Violence: Not on file    Family History  Problem Relation Age of Onset   Colon cancer Maternal Grandfather 13   Colon polyps Mother 59   Colon cancer Maternal Uncle 62   Hypertension Father    Diabetes Father    Heart disease Father    Hyperlipidemia Father    Stroke Father     Anti-infectives: Anti-infectives (From admission, onward)    None       Current Outpatient Medications  Medication Sig Dispense Refill   acetaminophen (TYLENOL) 500 MG tablet Take 500 mg by mouth every 6 (six) hours as needed for moderate pain or headache.     albuterol (VENTOLIN HFA) 108 (90 Base) MCG/ACT inhaler Inhale 2 puffs into the lungs every 6 (six) hours as needed for wheezing or shortness of breath. 8 g 0   ALPRAZolam (XANAX) 0.25 MG tablet TAKE 1/2 TO 1 TABLET BY MOUTH TWICE DAILY AS NEEDED FOR ANXIETY. (Patient taking differently: Take 0.625 mg by mouth daily as needed for anxiety.) 30 tablet 0   Ascorbic Acid (VITAMIN C PO) Take 1 tablet by mouth daily. With elderberry     aspirin EC 81 MG tablet Take 81 mg by mouth daily. Swallow whole.     CALCIUM PO Take 750 mg by mouth 2 (two) times daily with a meal.     cefdinir (OMNICEF) 300 MG capsule Take 1 capsule (300 mg total) by mouth 2 (two) times daily. 14 capsule 0   cholecalciferol (VITAMIN D3) 25 MCG (1000 UNIT) tablet Take 2,000 Units by mouth daily.     cycloSPORINE (RESTASIS) 0.05 % ophthalmic emulsion Place 1 drop into both eyes 2 (two) times daily.     denosumab (PROLIA) 60 MG/ML SOSY injection Inject 60 mg into the skin every 6 (six) months.     EPINEPHRINE 0.3 mg/0.3 mL IJ SOAJ injection INJECT INTO  OUTER THIGH AND HOLD AGAINST LEG FOR 10 SECONDS FOR SEVERE ALLERGIC REACTION. 2 each 3   ibuprofen (ADVIL) 600 MG tablet Take 1 tablet (600 mg total) by mouth every 6 (six) hours as needed. 30 tablet 0   Multiple Vitamins-Minerals (MULTIVITAMIN WITH MINERALS) tablet Take 1 tablet by mouth daily.      omeprazole (PRILOSEC) 40 MG capsule Take 1 capsule (40 mg total) by mouth  daily. 90 capsule 3   oxyCODONE-acetaminophen (PERCOCET/ROXICET) 5-325 MG tablet Take 1 tablet by mouth every 6 (six) hours as needed. 15 tablet 0   phenazopyridine (PYRIDIUM) 200 MG tablet Take 1 tablet (200 mg total) by mouth 3 (three) times daily as needed for pain. 10 tablet 0   polyethylene glycol-electrolytes (NULYTELY) 420 g solution As directed 4000 mL 0   prednisoLONE acetate (PRED FORTE) 1 % ophthalmic suspension Place 1 drop into the right eye 4 (four) times daily. 15 mL 0   PROLENSA 0.07 % SOLN INSTILL 1 DROP INTO THE RIGHT EYE TWICE DAILY. 3 mL 5   rosuvastatin (CRESTOR) 10 MG tablet Take one tablet (10 mg) by mouth on Monday, Wednesday, and Friday. 30 tablet 3   silver sulfADIAZINE (SILVADENE) 1 % cream Apply BID prn (Patient taking differently: Apply 1 application. topically 2 (two) times daily as needed (burns).) 50 g 6   SIMBRINZA 1-0.2 % SUSP Place 1 drop into the right eye 2 (two) times daily.     triamcinolone (NASACORT) 55 MCG/ACT AERO nasal inhaler Place 1 spray into the nose 2 (two) times daily. 1 each 0   Triamcinolone Acetonide (NASACORT ALLERGY 24HR NA) Place 2 sprays into the nose daily.     No current facility-administered medications for this visit.     Objective: Vital signs in last 24 hours: BP 129/75   Pulse 80   Ht '5\' 2"'$  (1.575 m)   Wt 96 lb (43.5 kg)   BMI 17.56 kg/m   Intake/Output from previous day: No intake/output data recorded. Intake/Output this shift: '@IOTHISSHIFT'$ @   Physical Exam Vitals reviewed.  Constitutional:      Appearance: Normal appearance.  Cardiovascular:      Rate and Rhythm: Normal rate and regular rhythm.     Heart sounds: Normal heart sounds.  Pulmonary:     Effort: Pulmonary effort is normal.     Breath sounds: Normal breath sounds.  Abdominal:     General: Abdomen is flat.     Palpations: Abdomen is soft. There is no mass.     Tenderness: There is no abdominal tenderness.  Musculoskeletal:        General: Normal range of motion.  Skin:    General: Skin is warm and dry.  Neurological:     General: No focal deficit present.     Mental Status: She is alert and oriented to person, place, and time.  Psychiatric:        Mood and Affect: Mood normal.        Behavior: Behavior normal.    Lab Results:  Results for orders placed or performed in visit on 12/24/21 (from the past 24 hour(s))  Urinalysis, Routine w reflex microscopic     Status: Abnormal   Collection Time: 12/24/21  3:47 PM  Result Value Ref Range   Specific Gravity, UA >1.030 (H) 1.005 - 1.030   pH, UA 6.0 5.0 - 7.5   Color, UA Yellow Yellow   Appearance Ur Clear Clear   Leukocytes,UA Negative Negative   Protein,UA Negative Negative/Trace   Glucose, UA Negative Negative   Ketones, UA Negative Negative   RBC, UA 2+ (A) Negative   Bilirubin, UA Negative Negative   Urobilinogen, Ur 0.2 0.2 - 1.0 mg/dL   Nitrite, UA Negative Negative   Microscopic Examination See below:    Narrative   Performed at:  [facility] [address] [place], [place]  [id number] Lab Director: Mina Marble MT, Phone:  [phone]  Microscopic Examination     Status: Abnormal   Collection Time: 12/24/21  3:47 PM   Urine  Result Value Ref Range   WBC, UA 6-10 (A) 0 - 5 /hpf   RBC 11-30 (A) 0 - 2 /hpf   Epithelial Cells (non renal) 0-10 0 - 10 /hpf   Renal Epithel, UA None seen None seen /hpf   Bacteria, UA Few None seen/Few   Narrative   Performed at:  [facility] [address], [place], [place]  [id number] Lab Director: [place], Phone:   [phone]    BMET No results for input(s): NA, K, CL, CO2, GLUCOSE, BUN, CREATININE, CALCIUM in the last 72 hours. PT/INR No results for input(s): LABPROT, INR in the last 72 hours. ABG No results for input(s): PHART, HCO3 in the last 72 hours.  Invalid input(s): PCO2, PO2  Studies/Results: No results found. CT films and report and Renal US films and report reviewed.   Assessment/Plan: Febrile UTI with probable left pyelonephritis and slowly resolving pain.   She got nitrofurantoin first which is not adequate for pyelo and has done better after Cefdinir but she still  has some mild dysuria with pyuria and microhematuria.   I will reculture the urine and have her return for cystoscopy particularly since she is a smoker.     Hx of stones.  She had no stones on the CT or renal US.    Back pain.  She has some scoliosis on CT and the residual back pain is likely musculoskeletal.   No orders of the defined types were placed in this encounter.    Orders Placed This Encounter  Procedures   Urine Culture   Microscopic Examination   Urinalysis, Routine w reflex microscopic     Return for Next available cystoscopy on return. .    CC: Dr. Sallee Lange.      Irine Seal 12/24/2021 [phone]

## 2021-12-26 LAB — URINE CULTURE: Organism ID, Bacteria: NO GROWTH

## 2021-12-31 ENCOUNTER — Encounter: Payer: Self-pay | Admitting: Urology

## 2021-12-31 ENCOUNTER — Ambulatory Visit: Payer: BC Managed Care – PPO | Admitting: Urology

## 2021-12-31 VITALS — BP 130/73 | HR 80

## 2021-12-31 DIAGNOSIS — Z87440 Personal history of urinary (tract) infections: Secondary | ICD-10-CM

## 2021-12-31 DIAGNOSIS — R300 Dysuria: Secondary | ICD-10-CM

## 2021-12-31 DIAGNOSIS — R3129 Other microscopic hematuria: Secondary | ICD-10-CM

## 2021-12-31 LAB — URINALYSIS, ROUTINE W REFLEX MICROSCOPIC
Bilirubin, UA: NEGATIVE
Glucose, UA: NEGATIVE
Leukocytes,UA: NEGATIVE
Nitrite, UA: NEGATIVE
Protein,UA: NEGATIVE
Specific Gravity, UA: >1.03 — ABNORMAL HIGH (ref 1.005–1.030)
Urobilinogen, Ur: 0.2 mg/dL (ref 0.2–1.0)
pH, UA: 5.5 (ref 5.0–7.5)

## 2021-12-31 LAB — MICROSCOPIC EXAMINATION
Bacteria, UA: NONE SEEN
Renal Epithel, UA: NONE SEEN /hpf

## 2021-12-31 MED ORDER — CEPHALEXIN 250 MG PO CAPS
500.0000 mg | ORAL_CAPSULE | Freq: Once | ORAL | Status: AC
  Administered 2021-12-31: 500 mg via ORAL

## 2021-12-31 NOTE — Progress Notes (Signed)
Subjective: 1. Microhematuria   2. Dysuria      12/31/21: Isabel Atkinson returns today for cystoscopy for further evaluation of her microhematuria.   Her UA remains positive for blood.   12/24/21: Isabel Atkinson is a 56 yo former patient with a history of stones.  She had left flank pain with a febrile UTI in early  April and it took 3 weeks to clear up.  She had a pan sensitive e. Coli on culture on 11/10/21. She got macrobid for a week and then Cefdinir for a week.   Her pain was severe and has been slow to resolve.  The flank pain has resolved but the back pain persists.   She had no nausea or vomiting.   She has mild irritation with voiding now.   She has 6-10 WBC's, 11-30 RBC's and a few bacteria.  She has chronic microhematuria but it increased with the UTI.  She had a UTI with Klebsiella and Enterococcus in 2021.  She had a negative CT on 11/17/21 and a negative renal US on 11/27/21.  She does have some scoliosis and a suggestion of lumbar DDD.  She has no incontinence. She is a smoker.   ROS:  ROS  Allergies  Allergen Reactions   Ciprofloxacin Other (See Comments)    Patient states with in five minutes of taking developed a splitting headache,itching all over and burning in mouth and lips.   Augmentin [Amoxicillin-Pot Clavulanate] Diarrhea   Carafate [Sucralfate]     Mouth tingling   Celexa [Citalopram Hydrobromide] Nausea And Vomiting   Chantix [Varenicline] Nausea And Vomiting   Sulfa Antibiotics Itching   Wellbutrin [Bupropion]     Severe weight loss and loss of appetite   Fosamax [Alendronate Sodium] Nausea And Vomiting    GERD can not tolerate    Past Medical History:  Diagnosis Date   Anxiety    COPD (chronic obstructive pulmonary disease) (HCC)    GERD (gastroesophageal reflux disease)    Hypercholesterolemia    Pre-diabetes    Reactive airway disease    Tachycardia     Past Surgical History:  Procedure Laterality Date   CATARACT EXTRACTION W/PHACO Left 01/17/2017   Procedure:  CATARACT EXTRACTION PHACO AND INTRAOCULAR LENS PLACEMENT ([place]);  Surgeon: Tonny Branch, MD;  Location: [facility];  Service: Ophthalmology;  Laterality: Left;  CDE: 6.92   CATARACT EXTRACTION W/PHACO Right 03/07/2020   Procedure: CATARACT EXTRACTION PHACO AND INTRAOCULAR LENS PLACEMENT RIGHT EYE;  Surgeon: Baruch Goldmann, MD;  Location: [facility];  Service: Ophthalmology;  Laterality: Right;  CDE: 8.44   COLONOSCOPY  2013   RMR: Normal rectum. single diminutive sigmoid polyp noted above. Normal terminal ileum. Status post segmental biopsy. next TCS in 10 years.    ELBOW SURGERY Right 2023   ESOPHAGOGASTRODUODENOSCOPY  2013   RMR: Possible cervical esophageal web-status post dilation as described above. Small hiatal hernia. Status post biopsy of normal appearing small bowel to screen for celiac disease.    ESOPHAGOGASTRODUODENOSCOPY N/A 05/06/2016   normal esophagus, small hiatal hernia, empiric dilation.   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    Employer: [employer]  Tobacco Use   Smoking status: Every Day    Packs/day: 1.00    Years: 25.00    Pack years: 25.00    Types: Cigarettes   Smokeless tobacco: Never  Vaping Use   Vaping Use: Never used  Substance and Sexual Activity   Alcohol use: No    Alcohol/week: 0.0 standard drinks   Drug use: No   Sexual activity: Yes    Birth control/protection: Post-menopausal, Surgical  Other Topics Concern   Not  on file  Social History Narrative   Lives at home w/ her husband   2 children - ages 63 & 32   Right-sided   Caffeine: 2 beverages per day   Social Determinants of Health   Financial Resource Strain: Not on file  Food Insecurity: Not on file  Transportation Needs: Not on file  Physical Activity: Not on file  Stress: Not on file  Social Connections: Not on file  Intimate Partner Violence: Not on file    Family History  Problem Relation Age of Onset   Colon cancer Maternal Grandfather 49   Colon polyps Mother 7   Colon cancer Maternal Uncle 82   Hypertension Father    Diabetes Father    Heart disease Father    Hyperlipidemia Father    Stroke Father     Anti-infectives: Anti-infectives (From admission, onward)    Start     Dose/Rate Route Frequency Ordered Stop   12/31/21 1330  CEPHALEXIN 250 MG PO CAPS        500 mg Oral  Once 12/31/21 1325 12/31/21 1400       Current Outpatient Medications  Medication Sig Dispense Refill   acetaminophen (TYLENOL) 500 MG tablet Take 500 mg by mouth every 6 (six) hours as needed for moderate pain or headache.     albuterol (VENTOLIN HFA) 108 (90 Base) MCG/ACT inhaler Inhale 2 puffs into the lungs every 6 (six) hours as needed for wheezing or shortness of breath. 8 g 0   ALPRAZolam (XANAX) 0.25 MG tablet TAKE 1/2 TO 1 TABLET BY MOUTH TWICE DAILY AS NEEDED FOR ANXIETY. (Patient taking differently: Take 0.625 mg by mouth daily as needed for anxiety.) 30 tablet 0   Ascorbic Acid (VITAMIN C PO) Take 1 tablet by mouth daily. With elderberry     aspirin EC 81 MG tablet Take 81 mg by mouth daily. Swallow whole.     CALCIUM PO Take 750 mg by mouth 2 (two) times daily with a meal.     cefdinir (OMNICEF) 300 MG capsule Take 1 capsule (300 mg total) by mouth 2 (two) times daily. 14 capsule 0   cholecalciferol (VITAMIN D3) 25 MCG (1000 UNIT) tablet Take 2,000 Units by mouth daily.     cycloSPORINE (RESTASIS) 0.05 % ophthalmic emulsion Place 1 drop  into both eyes 2 (two) times daily.     denosumab (PROLIA) 60 MG/ML SOSY injection Inject 60 mg into the skin every 6 (six) months.     EPINEPHRINE 0.3 mg/0.3 mL IJ SOAJ injection INJECT INTO OUTER THIGH AND HOLD AGAINST LEG FOR 10 SECONDS FOR SEVERE ALLERGIC REACTION. 2 each 3   ibuprofen (ADVIL) 600 MG tablet Take 1 tablet (600  mg total) by mouth every 6 (six) hours as needed. 30 tablet 0   Multiple Vitamins-Minerals (MULTIVITAMIN WITH MINERALS) tablet Take 1 tablet by mouth daily.      omeprazole (PRILOSEC) 40 MG capsule Take 1 capsule (40 mg total) by mouth daily. 90 capsule 3   oxyCODONE-acetaminophen (PERCOCET/ROXICET) 5-325 MG tablet Take 1 tablet by mouth every 6 (six) hours as needed. 15 tablet 0   phenazopyridine (PYRIDIUM) 200 MG tablet Take 1 tablet (200 mg total) by mouth 3 (three) times daily as needed for pain. 10 tablet 0   polyethylene glycol-electrolytes (NULYTELY) 420 g solution As directed 4000 mL 0   prednisoLONE acetate (PRED FORTE) 1 % ophthalmic suspension Place 1 drop into the right eye 4 (four) times daily. 15 mL 0   PROLENSA 0.07 % SOLN INSTILL 1 DROP INTO THE RIGHT EYE TWICE DAILY. 3 mL 5   rosuvastatin (CRESTOR) 10 MG tablet Take one tablet (10 mg) by mouth on Monday, Wednesday, and Friday. 30 tablet 3   silver sulfADIAZINE (SILVADENE) 1 % cream Apply BID prn (Patient taking differently: Apply 1 application. topically 2 (two) times daily as needed (burns).) 50 g 6   SIMBRINZA 1-0.2 % SUSP Place 1 drop into the right eye 2 (two) times daily.     triamcinolone (NASACORT) 55 MCG/ACT AERO nasal inhaler Place 1 spray into the nose 2 (two) times daily. 1 each 0   Triamcinolone Acetonide (NASACORT ALLERGY 24HR NA) Place 2 sprays into the nose daily.     No current facility-administered medications for this visit.     Objective: Vital signs in last 24 hours: BP 130/73   Pulse 80   Intake/Output from previous day: No intake/output data recorded. Intake/Output this  shift: '@IOTHISSHIFT'$ @   Physical Exam  Lab Results:  Results for orders placed or performed in visit on 12/31/21 (from the past 24 hour(s))  Urinalysis, Routine w reflex microscopic     Status: Abnormal   Collection Time: 12/31/21  1:09 PM  Result Value Ref Range   Specific Gravity, UA >1.030 (H) 1.005 - 1.030   pH, UA 5.5 5.0 - 7.5   Color, UA Yellow Yellow   Appearance Ur Clear Clear   Leukocytes,UA Negative Negative   Protein,UA Negative Negative/Trace   Glucose, UA Negative Negative   Ketones, UA Trace (A) Negative   RBC, UA 1+ (A) Negative   Bilirubin, UA Negative Negative   Urobilinogen, Ur 0.2 0.2 - 1.0 mg/dL   Nitrite, UA Negative Negative   Microscopic Examination See below:    Narrative   Performed at:  [facility] [address], [place], [place]  [id number] Lab Director: Mina Marble MT, Phone:  [phone]  Microscopic Examination     Status: Abnormal   Collection Time: 12/31/21  1:09 PM   Urine  Result Value Ref Range   WBC, UA 0-5 0 - 5 /hpf   RBC 3-10 (A) 0 - 2 /hpf   Epithelial Cells (non renal) 0-10 0 - 10 /hpf   Renal Epithel, UA None seen None seen /hpf   Bacteria, UA None seen None seen/Few   Narrative   Performed at:  [facility] [address], [place], [place]  [id number] Lab Director: [place], Phone:  [phone]     BMET No results for input(s): NA, K, CL, CO2, GLUCOSE, BUN, CREATININE, CALCIUM in the last 72 hours. PT/INR No results for input(s): LABPROT, INR in the last 72 hours. ABG No  results for input(s): PHART, HCO3 in the last 72 hours.  Invalid input(s): PCO2, PO2  Studies/Results: No results found. Procedure: flexible cystoscopy.  She was prepped with betadine.  The scope was passed without difficulty.  She had a normal urethra.  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  No mucosal lesions were noted.  The UO's were normal.     Assessment/Plan: Microhematuria with a history of UTI.   Cystoscopy is negative.  F/u in a year.   Hx of stones.  She had no stones on the CT or renal US.      Meds ordered this encounter  Medications   cephALEXin (KEFLEX) capsule 500 mg     Orders Placed This Encounter  Procedures   Microscopic Examination   Urinalysis, Routine w reflex microscopic     Return in about 1 year (around 01/01/2023).    CC: Dr. Sallee Lange.      Irine Seal 01/01/2022 [phone]

## 2022-01-27 ENCOUNTER — Ambulatory Visit (HOSPITAL_COMMUNITY): Payer: BC Managed Care – PPO | Admitting: Anesthesiology

## 2022-01-27 ENCOUNTER — Encounter (HOSPITAL_COMMUNITY): Payer: Self-pay | Admitting: Internal Medicine

## 2022-01-27 ENCOUNTER — Encounter (HOSPITAL_COMMUNITY)
Admission: RE | Disposition: A | Discharge status: Home / Self Care | Payer: Self-pay | Source: Home / Self Care | Attending: Internal Medicine

## 2022-01-27 ENCOUNTER — Other Ambulatory Visit: Payer: Self-pay

## 2022-01-27 ENCOUNTER — Ambulatory Visit (HOSPITAL_COMMUNITY)
Admission: RE | Admit: 2022-01-27 | Discharge: 2022-01-27 | Disposition: A | Discharge status: Home / Self Care | Payer: BC Managed Care – PPO | Attending: Internal Medicine | Admitting: Internal Medicine

## 2022-01-27 DIAGNOSIS — L280 Lichen simplex chronicus: Secondary | ICD-10-CM

## 2022-01-27 DIAGNOSIS — Z79899 Other long term (current) drug therapy: Secondary | ICD-10-CM | POA: Diagnosis not present

## 2022-01-27 DIAGNOSIS — K529 Noninfective gastroenteritis and colitis, unspecified: Secondary | ICD-10-CM

## 2022-01-27 DIAGNOSIS — J449 Chronic obstructive pulmonary disease, unspecified: Secondary | ICD-10-CM

## 2022-01-27 DIAGNOSIS — R319 Hematuria, unspecified: Secondary | ICD-10-CM

## 2022-01-27 DIAGNOSIS — R1314 Dysphagia, pharyngoesophageal phase: Secondary | ICD-10-CM | POA: Insufficient documentation

## 2022-01-27 DIAGNOSIS — F419 Anxiety disorder, unspecified: Secondary | ICD-10-CM | POA: Insufficient documentation

## 2022-01-27 DIAGNOSIS — Z1211 Encounter for screening for malignant neoplasm of colon: Secondary | ICD-10-CM

## 2022-01-27 DIAGNOSIS — Z8371 Family history of colonic polyps: Secondary | ICD-10-CM | POA: Diagnosis not present

## 2022-01-27 DIAGNOSIS — I2510 Atherosclerotic heart disease of native coronary artery without angina pectoris: Secondary | ICD-10-CM

## 2022-01-27 DIAGNOSIS — F17210 Nicotine dependence, cigarettes, uncomplicated: Secondary | ICD-10-CM | POA: Insufficient documentation

## 2022-01-27 DIAGNOSIS — D120 Benign neoplasm of cecum: Secondary | ICD-10-CM | POA: Diagnosis not present

## 2022-01-27 DIAGNOSIS — K449 Diaphragmatic hernia without obstruction or gangrene: Secondary | ICD-10-CM | POA: Insufficient documentation

## 2022-01-27 DIAGNOSIS — D123 Benign neoplasm of transverse colon: Secondary | ICD-10-CM | POA: Insufficient documentation

## 2022-01-27 DIAGNOSIS — R1319 Other dysphagia: Secondary | ICD-10-CM

## 2022-01-27 DIAGNOSIS — K589 Irritable bowel syndrome without diarrhea: Secondary | ICD-10-CM

## 2022-01-27 DIAGNOSIS — K219 Gastro-esophageal reflux disease without esophagitis: Secondary | ICD-10-CM

## 2022-01-27 DIAGNOSIS — M810 Age-related osteoporosis without current pathological fracture: Secondary | ICD-10-CM

## 2022-01-27 DIAGNOSIS — I700 Atherosclerosis of aorta: Secondary | ICD-10-CM

## 2022-01-27 DIAGNOSIS — Z800 Family history of malignant neoplasm of digestive organs: Secondary | ICD-10-CM | POA: Diagnosis not present

## 2022-01-27 DIAGNOSIS — F17200 Nicotine dependence, unspecified, uncomplicated: Secondary | ICD-10-CM

## 2022-01-27 HISTORY — PX: ESOPHAGOGASTRODUODENOSCOPY (EGD) WITH PROPOFOL: SHX5813

## 2022-01-27 HISTORY — PX: POLYPECTOMY: SHX5525

## 2022-01-27 HISTORY — PX: MALONEY DILATION: SHX5535

## 2022-01-27 HISTORY — PX: COLONOSCOPY WITH PROPOFOL: SHX5780

## 2022-01-27 SURGERY — COLONOSCOPY WITH PROPOFOL
Anesthesia: General

## 2022-01-27 MED ORDER — PROPOFOL 10 MG/ML IV BOLUS
INTRAVENOUS | Status: DC | PRN
  Administered 2022-01-27: 80 mg via INTRAVENOUS
  Administered 2022-01-27: 50 mg via INTRAVENOUS

## 2022-01-27 MED ORDER — PROPOFOL 500 MG/50ML IV EMUL
INTRAVENOUS | Status: AC
  Filled 2022-01-27: qty 50

## 2022-01-27 MED ORDER — PROPOFOL 500 MG/50ML IV EMUL
INTRAVENOUS | Status: DC | PRN
  Administered 2022-01-27: 180 ug/kg/min via INTRAVENOUS

## 2022-01-27 MED ORDER — STERILE WATER FOR IRRIGATION IR SOLN
Status: DC | PRN
  Administered 2022-01-27: 5 mL

## 2022-01-27 MED ORDER — LACTATED RINGERS IV SOLN: INTRAVENOUS | Status: DC

## 2022-01-27 NOTE — Transfer of Care (Signed)
Immediate Anesthesia Transfer of Care Note  Patient: Isabel Atkinson  Procedure(s) Performed: COLONOSCOPY WITH PROPOFOL ESOPHAGOGASTRODUODENOSCOPY (EGD) WITH PROPOFOL MALONEY DILATION POLYPECTOMY  Patient Location: PACU  Anesthesia Type:General  Level of Consciousness: awake, alert  and oriented  Airway & Oxygen Therapy: Patient Spontanous Breathing  Post-op Assessment: Report given to RN, Post -op Vital signs reviewed and stable, Patient moving all extremities X 4 and Patient able to stick tongue midline  Post vital signs: Reviewed  Last Vitals:  Vitals Value Taken Time  BP 127/86   Temp 97.6   Pulse 93   Resp 27   SpO2 93     Last Pain:  Vitals:   01/27/22 1140  TempSrc: Oral  PainSc: 2       Patients Stated Pain Goal: 7 (85/27/78 2423)  Complications: No notable events documented.

## 2022-01-27 NOTE — H&P (Signed)
$'@LOGO'E$ @   Primary Care Physician:  Kathyrn Drown, MD Primary Gastroenterologist:  Dr. Gala Romney  Pre-Procedure History & Physical: HPI:  Isabel Atkinson is a 56 y.o. female here for average rescreening colonoscopy and further evaluation of esophageal dysphagia in setting of GERD via EGD. Last colonoscopy 2013 teen-hyperplastic polyp.  No lower GI tract symptoms.  Past Medical History:  Diagnosis Date   Anxiety    COPD (chronic obstructive pulmonary disease) (HCC)    GERD (gastroesophageal reflux disease)    Hypercholesterolemia    Pre-diabetes    Reactive airway disease    Tachycardia     Past Surgical History:  Procedure Laterality Date   CATARACT EXTRACTION W/PHACO Left 01/17/2017   Procedure: CATARACT EXTRACTION PHACO AND INTRAOCULAR LENS PLACEMENT ([place]);  Surgeon: Tonny Branch, MD;  Location: [facility];  Service: Ophthalmology;  Laterality: Left;  CDE: 6.92   CATARACT EXTRACTION W/PHACO Right 03/07/2020   Procedure: CATARACT EXTRACTION PHACO AND INTRAOCULAR LENS PLACEMENT RIGHT EYE;  Surgeon: Baruch Goldmann, MD;  Location: [facility];  Service: Ophthalmology;  Laterality: Right;  CDE: 8.44   COLONOSCOPY  2013   RMR: Normal rectum. single diminutive sigmoid polyp noted above. Normal terminal ileum. Status post segmental biopsy. next TCS in 10 years.    ELBOW SURGERY Right 2023   ESOPHAGOGASTRODUODENOSCOPY  2013   RMR: Possible cervical esophageal web-status post dilation as described above. Small hiatal hernia. Status post biopsy of normal appearing small bowel to screen for celiac disease.    ESOPHAGOGASTRODUODENOSCOPY N/A 05/06/2016   normal esophagus, small hiatal hernia, empiric dilation.   EYE SURGERY Bilateral    Cat Sx - Symfony MF IOLs   MALONEY DILATION N/A 05/06/2016   Procedure: MALONEY DILATION;  Surgeon: Daneil Dolin, MD;  Location: [facility];  Service: Endoscopy;  Laterality: N/A;   NECK SURGERY  10/2012   OVARIAN CYST SURGERY  1997   removed   TUBAL LIGATION   7829   YAG LASER APPLICATION Bilateral     Prior to Admission medications   Medication Sig Start Date End Date Taking? Authorizing Provider  acetaminophen (TYLENOL) 500 MG tablet Take 500 mg by mouth every 6 (six) hours as needed for moderate pain or headache.   Yes [provider]  aspirin EC 81 MG tablet Take 81 mg by mouth daily. Swallow whole.   Yes [provider]  CALCIUM PO Take 750 mg by mouth 2 (two) times daily with a meal.   Yes [provider]  cholecalciferol (VITAMIN D3) 25 MCG (1000 UNIT) tablet Take 1,000 Units by mouth in the morning and at bedtime.   Yes [provider]  cycloSPORINE (RESTASIS) 0.05 % ophthalmic emulsion Place 1 drop into both eyes 2 (two) times daily.   Yes [provider]  Misc Natural Products (ELDERBERRY IMMUNE COMPLEX PO) Take 1 capsule by mouth daily.   Yes [provider]  Multiple Vitamins-Minerals (MULTIVITAMIN WITH MINERALS) tablet Take 1 tablet by mouth daily.    Yes [provider]  omeprazole (PRILOSEC) 40 MG capsule Take 1 capsule (40 mg total) by mouth daily. 11/03/21  Yes Annitta Needs, NP  PROLENSA 0.07 % SOLN INSTILL 1 DROP INTO THE RIGHT EYE TWICE DAILY. 09/04/21  Yes Bernarda Caffey, MD  rosuvastatin (CRESTOR) 10 MG tablet Take one tablet (10 mg) by mouth on Monday, Wednesday, and Friday. 12/07/21  Yes Fay Records, MD  silver sulfADIAZINE (SILVADENE) 1 % cream Apply BID prn Patient taking differently: Apply 1  application  topically 2 (two) times daily as needed (burns). 11/08/19  Yes Kathyrn Drown, MD  Triamcinolone Acetonide (NASACORT ALLERGY 24HR NA) Place 2 sprays into the nose daily as needed (allergies).   Yes [provider]  Vaginal Lubricant (REPLENS) GEL Place 1 Application vaginally every 3 (three) days.   Yes [provider]  albuterol (VENTOLIN HFA) 108 (90 Base) MCG/ACT inhaler Inhale 2 puffs into the lungs every 6 (six) hours as needed for wheezing or  shortness of breath. 08/21/20   Chalmers Guest, NP  ALPRAZolam (XANAX) 0.25 MG tablet TAKE 1/2 TO 1 TABLET BY MOUTH TWICE DAILY AS NEEDED FOR ANXIETY. Patient taking differently: Take 0.625 mg by mouth daily as needed for anxiety. 02/04/20   Kathyrn Drown, MD  cefdinir (OMNICEF) 300 MG capsule Take 1 capsule (300 mg total) by mouth 2 (two) times daily. Patient not taking: Reported on 01/20/2022 11/17/21   Coral Spikes, DO  denosumab (PROLIA) 60 MG/ML SOSY injection Inject 60 mg into the skin every 6 (six) months.    [provider]  EPINEPHRINE 0.3 mg/0.3 mL IJ SOAJ injection INJECT INTO OUTER THIGH AND HOLD AGAINST LEG FOR 10 SECONDS FOR SEVERE ALLERGIC REACTION. 05/11/21   Kathyrn Drown, MD  ibuprofen (ADVIL) 600 MG tablet Take 1 tablet (600 mg total) by mouth every 6 (six) hours as needed. Patient not taking: Reported on 01/20/2022 11/24/21   Kathyrn Drown, MD  oxyCODONE-acetaminophen (PERCOCET/ROXICET) 5-325 MG tablet Take 1 tablet by mouth every 6 (six) hours as needed. Patient not taking: Reported on 01/20/2022 11/24/21   Kathyrn Drown, MD  phenazopyridine (PYRIDIUM) 200 MG tablet Take 1 tablet (200 mg total) by mouth 3 (three) times daily as needed for pain. Patient not taking: Reported on 01/20/2022 11/10/21   Volney American, PA-C  polyethylene glycol-electrolytes (NULYTELY) 420 g solution As directed 12/18/21   Daneil Dolin, MD    Allergies as of 12/18/2021 - Review Complete 11/24/2021  Allergen Reaction Noted   Ciprofloxacin Other (See Comments) 01/26/2018   Augmentin [amoxicillin-pot clavulanate] Diarrhea 07/04/2018   Carafate [sucralfate]  04/19/2016   Celexa [citalopram hydrobromide] Nausea And Vomiting 02/27/2016   Chantix [varenicline] Nausea And Vomiting 02/27/2016   Sulfa antibiotics Itching 01/11/2012   Wellbutrin [bupropion]  01/12/2016   Fosamax [alendronate sodium] Nausea And Vomiting 11/09/2014    Family History  Problem Relation Age of Onset    Colon cancer Maternal Grandfather 56   Colon polyps Mother 67   Colon cancer Maternal Uncle 57   Hypertension Father    Diabetes Father    Heart disease Father    Hyperlipidemia Father    Stroke Father     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    Employer: [employer]  Tobacco Use   Smoking status: Every Day    Packs/day: 1.00    Years: 25.00    Total pack years: 25.00    Types: Cigarettes   Smokeless tobacco: Never  Vaping Use   Vaping Use: Never used  Substance and Sexual Activity   Alcohol use: No    Alcohol/week: 0.0 standard drinks of alcohol   Drug use: No   Sexual activity: Yes    Birth control/protection: Post-menopausal, Surgical  Other Topics Concern   Not on file  Social History Narrative   Lives at home w/ her husband   2 children - ages 31 & 33   Right-sided   Caffeine: 2 beverages per day   Social Determinants of Health   Financial Resource Strain: Not on file  Food Insecurity: Not on file  Transportation Needs: Not on file  Physical Activity: Not on file  Stress: Not on file  Social Connections: Not on file  Intimate Partner Violence: Not on file    Review of Systems: See HPI, otherwise negative ROS  Physical Exam: BP 138/80   Pulse 75   Temp (!) 97.5 F (36.4 C) (Oral)   Resp 18   Ht '5\' 2"'$  (1.575 m)   Wt 41.7 kg   SpO2 99%   BMI 16.83 kg/m  General:   Alert,  Well-developed, well-nourished, pleasant and cooperative in NAD Neck:  Supple; no masses or thyromegaly. No significant cervical adenopathy. Lungs:  Clear throughout to auscultation.   No wheezes, crackles, or rhonchi. No acute distress. Heart:  Regular rate and rhythm; no murmurs, clicks, rubs,  or gallops. Abdomen: Non-distended, normal bowel sounds.  Soft and nontender  without appreciable mass or hepatosplenomegaly.  Pulses:  Normal pulses noted. Extremities:  Without clubbing or edema.  Impression/Plan: 56 year old lady with well-controlled GERD now with esophageal dysphagia.  Here for EGD with possible esophageal dilation as feasible/appropriate per plan.  Also, here for a screening colonoscopy-average risk.  The risks, benefits, limitations, imponderables and alternatives regarding both EGD and colonoscopy have been reviewed with the patient. Questions have been answered. All parties agreeable.       Notice: This dictation was prepared with Dragon dictation along with smaller phrase technology. Any transcriptional errors that result from this process are unintentional and may not be corrected upon review.

## 2022-01-27 NOTE — Op Note (Addendum)
[facility] Patient Name: Isabel Atkinson Procedure Date: 01/27/2022 12:19 PM MRN: [id number] Date of Birth: 1965/12/10 Attending MD: Norvel Richards , MD CSN: [id number] Age: 56 Admit Type: Outpatient Procedure:                Colonoscopy Indications:              Screening for colorectal malignant neoplasm Providers:                Norvel Richards, MD, Lurline Del, RN, Everardo Pacific, Randa Spike, Technician Referring MD:              Medicines:                Propofol per Anesthesia Complications:            No immediate complications. Estimated Blood Loss:     Estimated blood loss was minimal. Procedure:                Pre-Anesthesia Assessment:                           - Prior to the procedure, a History and Physical                            was performed, and patient medications and                            allergies were reviewed. The patient's tolerance of                            previous anesthesia was also reviewed. The risks                            and benefits of the procedure and the sedation                            options and risks were discussed with the patient.                            All questions were answered, and informed consent                            was obtained. Prior Anticoagulants: The patient has                            taken no previous anticoagulant or antiplatelet                            agents. ASA Grade Assessment: II - A patient with                            mild systemic disease. After reviewing the risks  and benefits, the patient was deemed in                            satisfactory condition to undergo the procedure.                           After obtaining informed consent, the colonoscope                            was passed under direct vision. Throughout the                            procedure, the patient's blood pressure, pulse, and                             oxygen saturations were monitored continuously. The                            [id number]) scope was introduced through the                            anus and advanced to the the cecum, identified by                            appendiceal orifice and ileocecal valve. The                            colonoscopy was performed without difficulty. The                            patient tolerated the procedure well. The quality                            of the bowel preparation was adequate. Scope In: 1:27:56 PM Scope Out: 1:41:43 PM Scope Withdrawal Time: 0 hours 9 minutes 13 seconds  Total Procedure Duration: 0 hours 13 minutes 47 seconds  Findings:      The perianal and digital rectal examinations were normal.      Two sessile polyps were found in the hepatic flexure and cecum. The       polyps were 5 to 7 mm in size. These polyps were removed with a cold       snare. Resection and retrieval were complete. Estimated blood loss was       minimal.      The exam was otherwise without abnormality. Impression:               - Two 5 to 7 mm polyps at the hepatic flexure and                            in the cecum, removed with a cold snare. Resected                            and retrieved.                           -  The examination was otherwise normal. Rectal                            vault seen well on-face. Too small to retroflex.                            Rectal mucosa appeared normal. Moderate Sedation:      Moderate (conscious) sedation was personally administered by an       anesthesia professional. The following parameters were monitored: oxygen       saturation, heart rate, blood pressure, respiratory rate, EKG, adequacy       of pulmonary ventilation, and response to care. Recommendation:           - Patient has a contact number available for                            emergencies. The signs and symptoms of potential                             delayed complications were discussed with the                            patient. Return to normal activities tomorrow.                            Written discharge instructions were provided to the                            patient.                           - Resume previous diet.                           - Continue present medications.                           - Repeat colonoscopy date to be determined after                            pending pathology results are reviewed for                            surveillance.                           - Return to GI office in 4 months. See EGD report. Procedure Code(s):        --- Professional ---                           [phone], Colonoscopy, flexible; with removal of                            tumor(s), polyp(s), or other lesion(s) by snare  technique Diagnosis Code(s):        --- Professional ---                           Z12.11, Encounter for screening for malignant                            neoplasm of colon                           K63.5, Polyp of colon CPT copyright 2019 American Medical Association. All rights reserved. The codes documented in this report are preliminary and upon coder review may  be revised to meet current compliance requirements. Cristopher Estimable. Tineka Uriegas, MD Norvel Richards, MD 01/27/2022 1:47:19 PM This report has been signed electronically. Number of Addenda: 0

## 2022-01-27 NOTE — Discharge Instructions (Addendum)
Colonoscopy Discharge Instructions  Read the instructions outlined below and refer to this sheet in the next few weeks. These discharge instructions provide you with general information on caring for yourself after you leave the hospital. Your doctor may also give you specific instructions. While your treatment has been planned according to the most current medical practices available, unavoidable complications occasionally occur. If you have any problems or questions after discharge, call Dr. Gala Romney at [phone]. ACTIVITY You may resume your regular activity, but move at a slower pace for the next 24 hours.  Take frequent rest periods for the next 24 hours.  Walking will help get rid of the air and reduce the bloated feeling in your belly (abdomen).  No driving for 24 hours (because of the medicine (anesthesia) used during the test).   Do not sign any important legal documents or operate any machinery for 24 hours (because of the anesthesia used during the test).  NUTRITION Drink plenty of fluids.  You may resume your normal diet as instructed by your doctor.  Begin with a light meal and progress to your normal diet. Heavy or fried foods are harder to digest and may make you feel sick to your stomach (nauseated).  Avoid alcoholic beverages for 24 hours or as instructed.  MEDICATIONS You may resume your normal medications unless your doctor tells you otherwise.  WHAT YOU CAN EXPECT TODAY Some feelings of bloating in the abdomen.  Passage of more gas than usual.  Spotting of blood in your stool or on the toilet paper.  IF YOU HAD POLYPS REMOVED DURING THE COLONOSCOPY: No aspirin products for 7 days or as instructed.  No alcohol for 7 days or as instructed.  Eat a soft diet for the next 24 hours.  FINDING OUT THE RESULTS OF YOUR TEST Not all test results are available during your visit. If your test results are not back during the visit, make an appointment with your caregiver to find out the  results. Do not assume everything is normal if you have not heard from your caregiver or the medical facility. It is important for you to follow up on all of your test results.  SEEK IMMEDIATE MEDICAL ATTENTION IF: You have more than a spotting of blood in your stool.  Your belly is swollen (abdominal distention).  You are nauseated or vomiting.  You have a temperature over 101.  You have abdominal pain or discomfort that is severe or gets worse throughout the day.   EGD Discharge instructions Please read the instructions outlined below and refer to this sheet in the next few weeks. These discharge instructions provide you with general information on caring for yourself after you leave the hospital. Your doctor may also give you specific instructions. While your treatment has been planned according to the most current medical practices available, unavoidable complications occasionally occur. If you have any problems or questions after discharge, please call your doctor. ACTIVITY You may resume your regular activity but move at a slower pace for the next 24 hours.  Take frequent rest periods for the next 24 hours.  Walking will help expel (get rid of) the air and reduce the bloated feeling in your abdomen.  No driving for 24 hours (because of the anesthesia (medicine) used during the test).  You may shower.  Do not sign any important legal documents or operate any machinery for 24 hours (because of the anesthesia used during the test).  NUTRITION Drink plenty of fluids.  You may  resume your normal diet.  Begin with a light meal and progress to your normal diet.  Avoid alcoholic beverages for 24 hours or as instructed by your caregiver.  MEDICATIONS You may resume your normal medications unless your caregiver tells you otherwise.  WHAT YOU CAN EXPECT TODAY You may experience abdominal discomfort such as a feeling of fullness or "gas" pains.  FOLLOW-UP Your doctor will discuss the results of  your test with you.  SEEK IMMEDIATE MEDICAL ATTENTION IF ANY OF THE FOLLOWING OCCUR: Excessive nausea (feeling sick to your stomach) and/or vomiting.  Severe abdominal pain and distention (swelling).  Trouble swallowing.  Temperature over 101 F (37.8 C).  Rectal bleeding or vomiting of blood.    Your esophagus was stretched today  2 polyps removed from your colon  Further recommendations to follow pending review of pathology report  Office visit with [place] in 4 months  Patient request, I called Glendell Docker at [phone] and reviewed findings and recommendations

## 2022-01-27 NOTE — Anesthesia Postprocedure Evaluation (Signed)
Anesthesia Post Note  Patient: Isabel Atkinson  Procedure(s) Performed: COLONOSCOPY WITH PROPOFOL ESOPHAGOGASTRODUODENOSCOPY (EGD) WITH PROPOFOL [place] POLYPECTOMY  Patient location during evaluation: Phase II Anesthesia Type: General Level of consciousness: awake and alert and oriented Pain management: pain level controlled Vital Signs Assessment: post-procedure vital signs reviewed and stable Respiratory status: spontaneous breathing, nonlabored ventilation and respiratory function stable Cardiovascular status: blood pressure returned to baseline and stable Postop Assessment: no apparent nausea or vomiting Anesthetic complications: no   No notable events documented.   Last Vitals:  Vitals:   01/27/22 1140 01/27/22 1347  BP: 138/80 127/86  Pulse: 75 91  Resp: 18 (!) 24  Temp: (!) 36.4 C 36.5 C  SpO2: 99% 99%    Last Pain:  Vitals:   01/27/22 1347  TempSrc: Oral  PainSc: 0-No pain                 Deloris Mittag C Tirrell Buchberger

## 2022-01-27 NOTE — Anesthesia Preprocedure Evaluation (Addendum)
Anesthesia Evaluation  Patient identified by MRN, date of birth, ID band Patient awake    Reviewed: Allergy & Precautions, NPO status , Patient's Chart, lab work & pertinent test results  Airway Mallampati: II  TM Distance: >3 FB Neck ROM: Full    Dental  (+) Dental Advisory Given, Implants Crowns, fillings:   Pulmonary COPD,  COPD inhaler, Current SmokerPatient did not abstain from smoking.,    Pulmonary exam normal breath sounds clear to auscultation       Cardiovascular Exercise Tolerance: Good + CAD  Normal cardiovascular exam+ dysrhythmias (tachycardia)  Rhythm:Regular Rate:Normal - Systolic murmurs    Neuro/Psych PSYCHIATRIC DISORDERS Anxiety negative neurological ROS     GI/Hepatic Neg liver ROS, GERD  Medicated and Controlled,  Endo/Other  negative endocrine ROS  Renal/GU negative Renal ROS  negative genitourinary   Musculoskeletal negative musculoskeletal ROS (+)   Abdominal   Peds negative pediatric ROS (+)  Hematology negative hematology ROS (+)   Anesthesia Other Findings   Reproductive/Obstetrics negative OB ROS                          Anesthesia Physical Anesthesia Plan  ASA: 2  Anesthesia Plan: General   Post-op Pain Management: Minimal or no pain anticipated   Induction: Intravenous  PONV Risk Score and Plan: Propofol infusion  Airway Management Planned: Nasal Cannula and Natural Airway  Additional Equipment:   Intra-op Plan:   Post-operative Plan:   Informed Consent: I have reviewed the patients History and Physical, chart, labs and discussed the procedure including the risks, benefits and alternatives for the proposed anesthesia with the patient or authorized representative who has indicated his/her understanding and acceptance.     Dental advisory given  Plan Discussed with: CRNA and Surgeon  Anesthesia Plan Comments:         Anesthesia Quick  Evaluation

## 2022-01-29 ENCOUNTER — Encounter: Payer: Self-pay | Admitting: Internal Medicine

## 2022-01-29 LAB — SURGICAL PATHOLOGY

## 2022-02-17 NOTE — Op Note (Signed)
[facility] Patient Name: Isabel Atkinson Procedure Date: 01/27/2022 12:18 PM MRN: [id number] Date of Birth: 1966-08-07 Attending MD: Norvel Richards , MD CSN: [id number] Age: 56 Admit Type: Outpatient Procedure:                Upper GI endoscopy Indications:              Dysphagia Providers:                Norvel Richards, MD, Lurline Del, RN, Everardo Pacific, Randa Spike, Technician Referring MD:              Medicines:                Propofol per Anesthesia Complications:            No immediate complications. Estimated Blood Loss:     Estimated blood loss was minimal. Procedure:                Pre-Anesthesia Assessment:                           - Prior to the procedure, a History and Physical                            was performed, and patient medications and                            allergies were reviewed. The patient's tolerance of                            previous anesthesia was also reviewed. The risks                            and benefits of the procedure and the sedation                            options and risks were discussed with the patient.                            All questions were answered, and informed consent                            was obtained. Prior Anticoagulants: The patient has                            taken no previous anticoagulant or antiplatelet                            agents. ASA Grade Assessment: II - A patient with                            mild systemic disease. After reviewing the risks  and benefits, the patient was deemed in                            satisfactory condition to undergo the procedure.                           After obtaining informed consent, the endoscope was                            passed under direct vision. Throughout the                            procedure, the patient's blood pressure, pulse, and                            oxygen  saturations were monitored continuously. The                            GIF-H190 ([id number]) scope was introduced through the                            mouth, and advanced to the second part of duodenum.                            The upper GI endoscopy was accomplished without                            difficulty. The patient tolerated the procedure                            well. Scope In: 1:13:41 PM Scope Out: 1:20:36 PM Total Procedure Duration: 0 hours 6 minutes 55 seconds  Findings:      The examined esophagus was normal.      A small hiatal hernia was present. Gastric cavity empty.       Normal-appearing gastric mucosa. Patent pylorus.      The duodenal bulb and second portion of the duodenum were normal. The       scope was withdrawn. Dilation was performed with a Maloney dilator with       no resistance at 19 Fr. The dilation site was examined following       endoscope reinsertion and showed mild mucosal disruption. Estimated       blood loss was minimal. Impression:               - Normal esophagus. Dilated.                           - Small hiatal hernia.                           - Normal duodenal bulb and second portion of the                            duodenum.                           -  No specimens collected. Moderate Sedation:      Moderate (conscious) sedation was personally administered by an       anesthesia professional. The following parameters were monitored: oxygen       saturation, heart rate, blood pressure, respiratory rate, EKG, adequacy       of pulmonary ventilation, and response to care. Recommendation:           - Patient has a contact number available for                            emergencies. The signs and symptoms of potential                            delayed complications were discussed with the                            patient. Return to normal activities tomorrow.                            Written discharge instructions were provided to  the                            patient.                           - Resume previous diet.                           - Continue present medications.                           - Return to my office in 3 months. See colonoscopy                            report. Procedure Code(s):        --- Professional ---                           [phone], Esophagogastroduodenoscopy, flexible,                            transoral; diagnostic, including collection of                            specimen(s) by brushing or washing, when performed                            (separate procedure)                           43450, Dilation of esophagus, by unguided sound or                            bougie, single or multiple passes Diagnosis Code(s):        --- Professional ---  K44.9, Diaphragmatic hernia without obstruction or                            gangrene                           R13.10, Dysphagia, unspecified CPT copyright 2019 [employer]. All rights reserved. The codes documented in this report are preliminary and upon coder review may  be revised to meet current compliance requirements. Cristopher Estimable. Lexxus Underhill, MD Norvel Richards, MD 02/17/2022 7:58:59 AM This report has been signed electronically. Number of Addenda: 0

## 2022-02-18 ENCOUNTER — Encounter (HOSPITAL_COMMUNITY): Payer: Self-pay | Admitting: Internal Medicine

## 2022-03-03 ENCOUNTER — Ambulatory Visit: Payer: BC Managed Care – PPO | Admitting: Physician Assistant

## 2022-03-03 VITALS — BP 127/74 | HR 83 | Ht 63.0 in | Wt 100.0 lb

## 2022-03-03 DIAGNOSIS — N952 Postmenopausal atrophic vaginitis: Secondary | ICD-10-CM

## 2022-03-03 DIAGNOSIS — R8271 Bacteriuria: Secondary | ICD-10-CM

## 2022-03-03 DIAGNOSIS — R350 Frequency of micturition: Secondary | ICD-10-CM

## 2022-03-03 LAB — URINALYSIS, ROUTINE W REFLEX MICROSCOPIC
Bilirubin, UA: NEGATIVE
Glucose, UA: NEGATIVE
Ketones, UA: NEGATIVE
Leukocytes,UA: NEGATIVE
Nitrite, UA: NEGATIVE
Protein,UA: NEGATIVE
Specific Gravity, UA: >1.03 — ABNORMAL HIGH (ref 1.005–1.030)
Urobilinogen, Ur: 0.2 mg/dL (ref 0.2–1.0)
pH, UA: 5 (ref 5.0–7.5)

## 2022-03-03 LAB — BLADDER SCAN AMB NON-IMAGING: Scan Result: 0

## 2022-03-03 LAB — MICROSCOPIC EXAMINATION: Renal Epithel, UA: NONE SEEN /hpf

## 2022-03-03 MED ORDER — ESTRADIOL 0.1 MG/GM VA CREA: TOPICAL_CREAM | VAGINAL | 3 refills | Status: DC

## 2022-03-03 MED ORDER — NITROFURANTOIN MONOHYD MACRO 100 MG PO CAPS: 100.0000 mg | ORAL_CAPSULE | Freq: Two times a day (BID) | ORAL | 0 refills | Status: AC

## 2022-03-03 NOTE — Progress Notes (Signed)
Assessment: 1. Frequent urination - Urinalysis, Routine w reflex microscopic - BLADDER SCAN AMB NON-IMAGING  2. Bacteriuria - Urine Culture  3. Atrophic vaginitis    Plan: Urine is sent for culture and Macrobid initiated.  We will adjust therapy if indicated by culture results.  Discussed HRT and external Estrace cream at length with the patient.  She would like to initiate treatment in order to prevent recurrent infections.  Side effects, risks, benefits discussed and she is given verbal and written directions to apply small amount around the urethra twice a week.  Chief Complaint: No chief complaint on file.   HPI: Isabel Atkinson is a 56 y.o. female with h/o micro hematuria and recurrent UTIs with negative cysto on 5/25 who presents for evaluation of an increase in urinary frequency and ongoing, long-term burning in the urethral and vaginal areas.  She denies dysuria, new incontinence symptoms, fever, chills, abdominal pain.  No gross hematuria. Pt has discussed HRT in the past, but has been reluctant to initiate.  Early menopause in her mid 9s. Most recent cx was negative on 5/18.  UA = 0-5 WBCs, 11-30 RBCs, few bacteria, nitrite negative PVR = 0 mL  12/31/21: Isabel Atkinson returns today for cystoscopy for further evaluation of her microhematuria.   Her UA remains positive for blood.    12/24/21: Isabel Atkinson is a 56 yo former patient with a history of stones.  She had left flank pain with a febrile UTI in early  April and it took 3 weeks to clear up.  She had a pan sensitive e. Coli on culture on 11/10/21. She got macrobid for a week and then Cefdinir for a week.   Her pain was severe and has been slow to resolve.  The flank pain has resolved but the back pain persists.   She had no nausea or vomiting.   She has mild irritation with voiding now.   She has 6-10 WBC's, 11-30 RBC's and a few bacteria.  She has chronic microhematuria but it increased with the UTI.  She had a UTI with Klebsiella  and Enterococcus in 2021.  She had a negative CT on 11/17/21 and a negative renal US on 11/27/21.  She does have some scoliosis and a suggestion of lumbar DDD.  She has no incontinence. She is a smoker.    Portions of the above documentation were copied from a prior visit for review purposes only.  Allergies: Allergies  Allergen Reactions   Ciprofloxacin Other (See Comments)    Patient states with in five minutes of taking developed a splitting headache,itching all over and burning in mouth and lips.   Augmentin [Amoxicillin-Pot Clavulanate] Diarrhea   Carafate [Sucralfate]     Mouth tingling   Celexa [Citalopram Hydrobromide] Nausea And Vomiting   Chantix [Varenicline] Nausea And Vomiting   Sulfa Antibiotics Itching   Wellbutrin [Bupropion]     Severe weight loss and loss of appetite   Fosamax [Alendronate Sodium] Nausea And Vomiting    GERD can not tolerate    PMH: Past Medical History:  Diagnosis Date   Anxiety    COPD (chronic obstructive pulmonary disease) (HCC)    GERD (gastroesophageal reflux disease)    Hypercholesterolemia    Pre-diabetes    Reactive airway disease    Tachycardia     PSH: Past Surgical History:  Procedure Laterality Date   CATARACT EXTRACTION W/PHACO Left 01/17/2017   Procedure: CATARACT EXTRACTION PHACO AND INTRAOCULAR LENS PLACEMENT ([place]);  Surgeon: Tonny Branch, MD;  Location: [facility];  Service: Ophthalmology;  Laterality: Left;  CDE: 6.92   CATARACT EXTRACTION W/PHACO Right 03/07/2020   Procedure: CATARACT EXTRACTION PHACO AND INTRAOCULAR LENS PLACEMENT RIGHT EYE;  Surgeon: Baruch Goldmann, MD;  Location: [facility];  Service: Ophthalmology;  Laterality: Right;  CDE: 8.44   COLONOSCOPY  2013   RMR: Normal rectum. single diminutive sigmoid polyp noted above. Normal terminal ileum. Status post segmental biopsy. next TCS in 10 years.    COLONOSCOPY WITH PROPOFOL N/A 01/27/2022   Procedure: COLONOSCOPY WITH PROPOFOL;  Surgeon: Daneil Dolin, MD;  Location:  [facility];  Service: Endoscopy;  Laterality: N/A;  12:45pm   ELBOW SURGERY Right 2023   ESOPHAGOGASTRODUODENOSCOPY  2013   RMR: Possible cervical esophageal web-status post dilation as described above. Small hiatal hernia. Status post biopsy of normal appearing small bowel to screen for celiac disease.    ESOPHAGOGASTRODUODENOSCOPY N/A 05/06/2016   normal esophagus, small hiatal hernia, empiric dilation.   ESOPHAGOGASTRODUODENOSCOPY (EGD) WITH PROPOFOL N/A 01/27/2022   Procedure: ESOPHAGOGASTRODUODENOSCOPY (EGD) WITH PROPOFOL;  Surgeon: Daneil Dolin, MD;  Location: [facility];  Service: Endoscopy;  Laterality: N/A;   EYE SURGERY Bilateral    Cat Sx - Symfony MF IOLs   MALONEY DILATION N/A 05/06/2016   Procedure: MALONEY DILATION;  Surgeon: Daneil Dolin, MD;  Location: [facility];  Service: Endoscopy;  Laterality: N/A;   MALONEY DILATION N/A 01/27/2022   Procedure: Venia Minks DILATION;  Surgeon: Daneil Dolin, MD;  Location: [facility];  Service: Endoscopy;  Laterality: N/A;   NECK SURGERY  10/2012   OVARIAN CYST SURGERY  1997   removed   POLYPECTOMY  01/27/2022   Procedure: POLYPECTOMY;  Surgeon: Daneil Dolin, MD;  Location: [facility];  Service: Endoscopy;;  cecal   TUBAL LIGATION  3299   YAG LASER APPLICATION Bilateral     SH: Social History   Tobacco Use   Smoking status: Every Day    Packs/day: 1.00    Years: 25.00    Total pack years: 25.00    Types: Cigarettes   Smokeless tobacco: Never  Vaping Use   Vaping Use: Never used  Substance Use Topics   Alcohol use: No    Alcohol/week: 0.0 standard drinks of alcohol   Drug use: No    ROS: All other review of systems were reviewed and are negative except what is noted above in HPI  PE: BP 127/74   Pulse 83   Ht '5\' 3"'$  (1.6 m)   Wt 100 lb (45.4 kg)   BMI 17.71 kg/m  GENERAL APPEARANCE:  Well appearing, well developed, well nourished, NAD HEENT:  Atraumatic, normocephalic NECK:  Supple. Trachea  midline ABDOMEN:  Soft, non-tender, no masses EXTREMITIES:  Moves all extremities well, without clubbing, cyanosis, or edema NEUROLOGIC:  Alert and oriented x 3, normal gait, CN II-XII grossly intact MENTAL STATUS:  appropriate BACK:  Non-tender to palpation, No CVAT SKIN:  Warm, dry, and intact   Results: Laboratory Data: Lab Results  Component Value Date   WBC 7.9 12/03/2021   HGB 13.0 12/03/2021   HCT 39.1 12/03/2021   MCV 90 12/03/2021   PLT 277 12/03/2021    Lab Results  Component Value Date   CREATININE 0.53 (L) 12/03/2021    No results found for: "PSA"  No results found for: "TESTOSTERONE"  No results found for: "HGBA1C"  Urinalysis    Component Value Date/Time   COLORURINE YELLOW 08/26/2017 [place]  Clear 12/31/2021 1309   LABSPEC 1.013 08/26/2017 1550   PHURINE 6.0 08/26/2017 1550   GLUCOSEU Negative 12/31/2021 1309   HGBUR NEGATIVE 08/26/2017 1550   BILIRUBINUR Negative 12/31/2021 1309   KETONESUR negative 11/10/2021 1851   KETONESUR NEGATIVE 08/26/2017 1550   PROTEINUR Negative 12/31/2021 1309   PROTEINUR NEGATIVE 08/26/2017 1550   UROBILINOGEN 0.2 11/24/2021 0910   NITRITE Negative 12/31/2021 1309   NITRITE NEGATIVE 08/26/2017 1550   LEUKOCYTESUR Negative 12/31/2021 1309    Lab Results  Component Value Date   LABMICR See below: 12/31/2021   WBCUA 0-5 12/31/2021   LABEPIT 0-10 12/31/2021   MUCUS 0 11/01/2013   BACTERIA None seen 12/31/2021    Pertinent Imaging: Results for orders placed in visit on 03/21/17  DG Abd 1 View  Narrative CLINICAL DATA:  Two week history of flank pain  EXAM: ABDOMEN - 1 VIEW  COMPARISON:  November 27, 2015  FINDINGS: There is a 5 mm calcification in the lower left kidney region, stable from prior study. There is a probable 4 mm calculus in the upper pole the right kidney, stable. There are tiny phleboliths in the pelvis.  There is moderate stool in the colon. There is no bowel dilatation or  air-fluid level to suggest bowel obstruction. No free air. Visualized lung bases are clear.  IMPRESSION: Stable calculi in the kidneys bilaterally. No new calcification evident. Bowel gas pattern unremarkable without evidence of bowel obstruction or free air.   Electronically Signed By: Lowella Grip III M.D. On: 03/25/2017 17:32  No results found for this or any previous visit.  No results found for this or any previous visit.  No results found for this or any previous visit.  Results for orders placed in visit on 03/28/17  US Renal  Narrative CLINICAL DATA:  2-3 weeks of left sided renal colic symptoms. Known bilateral kidney stones.  EXAM: RENAL / URINARY TRACT ULTRASOUND COMPLETE  COMPARISON:  [facility] of March 25, 2017  FINDINGS: Right Kidney:  Length: 10.8 cm. The renal cortical echotexture remains lower than that of the liver. There is no hydronephrosis. No discrete stones are observed.  Left Kidney:  Length: 12.2 cm. The renal cortical echotexture is similar to that on the right. There is no hydronephrosis. No stones are evident.  Bladder:  Appears normal for degree of bladder distention.  IMPRESSION: Normal renal ultrasound examination.  No hydronephrosis.   Electronically Signed By: David  Martinique M.D. On: 03/29/2017 14:53  No results found for this or any previous visit.  No results found for this or any previous visit.  Results for orders placed in visit on 11/17/21  CT RENAL STONE STUDY  Narrative CLINICAL DATA:  Left-sided flank pain with dysuria.  EXAM: CT ABDOMEN AND PELVIS WITHOUT CONTRAST  TECHNIQUE: Multidetector CT imaging of the abdomen and pelvis was performed following the standard protocol without IV contrast.  RADIATION DOSE REDUCTION: This exam was performed according to the departmental dose-optimization program which includes automated exposure control, adjustment of the mA and/or kV according to patient size  and/or use of iterative reconstruction technique.  COMPARISON:  10/09/2015  FINDINGS: Lower chest: Unremarkable.  Hepatobiliary: Stable 7 mm hypodensity medial segment left liver consistent with benign etiology. No followup recommended. There is no evidence for gallstones, gallbladder wall thickening, or pericholecystic fluid. No intrahepatic or extrahepatic biliary dilation.  Pancreas: No focal mass lesion. No dilatation of the main duct. No intraparenchymal cyst. No peripancreatic edema.  Spleen: No splenomegaly. No focal mass  lesion.  Adrenals/Urinary Tract: No adrenal nodule or mass. Kidneys unremarkable. No evidence for hydroureter. The urinary bladder appears normal for the degree of distention.  Stomach/Bowel: Stomach is unremarkable. No gastric wall thickening. No evidence of outlet obstruction. Duodenum is normally positioned as is the ligament of Treitz. No small bowel wall thickening. No small bowel dilatation. The terminal ileum is normal. The appendix is normal. No gross colonic mass. No colonic wall thickening.  Vascular/Lymphatic: There is mild atherosclerotic calcification of the abdominal aorta without aneurysm. There is no gastrohepatic or hepatoduodenal ligament lymphadenopathy. No retroperitoneal or mesenteric lymphadenopathy. No pelvic sidewall lymphadenopathy.  Reproductive: Unremarkable.  Other: No intraperitoneal free fluid.  Musculoskeletal: No worrisome lytic or sclerotic osseous abnormality.  IMPRESSION: No acute findings in the abdomen or pelvis. Specifically, no findings to explain the patient's history of left flank pain.  Aortic Atherosclerosis (ICD10-I70.0).   Electronically Signed By: Misty Stanley M.D. On: 11/17/2021 15:55  No results found for this or any previous visit (from the past 24 hour(s)).

## 2022-03-03 NOTE — Progress Notes (Unsigned)
post void residual = 0 ml

## 2022-03-05 LAB — URINE CULTURE: Organism ID, Bacteria: NO GROWTH

## 2022-03-15 ENCOUNTER — Telehealth: Payer: Self-pay

## 2022-03-15 NOTE — Telephone Encounter (Signed)
-----   Message from Cleon Gustin, MD sent at 03/10/2022  5:09 PM EDT ----- Continue macrobid ----- Message ----- From: Audie Box, CMA Sent: 03/10/2022   4:34 PM EDT To: Cleon Gustin, MD  Please review, pt taking macrobid

## 2022-03-15 NOTE — Telephone Encounter (Signed)
Patient aware and has already completed treatment.

## 2022-03-16 NOTE — Progress Notes (Signed)
[facility] Note  03/19/2022     CHIEF COMPLAINT Patient presents for Retina Follow Up  HISTORY OF PRESENT ILLNESS: Isabel Atkinson is a 56 y.o. female who presents to the clinic today for:   HPI     Retina Follow Up   Patient presents with  Other.  In right eye.  This started 4 months ago.  I, the attending physician,  performed the HPI with the patient and updated documentation appropriately.        Comments   Patient here for 4 months retina follow up for central retinal edema OD. Patient states vision is good. No eye pain.       Last edited by Bernarda Caffey, MD on 03/21/2022 12:27 AM.    Pt states Dr. Marisa Hua took her off all pressure drops, she is only using Prolensa BID OD  Referring physician: Baruch Goldmann, MD [address], [address] , [place]  [address]  HISTORICAL INFORMATION:   Selected notes from the MEDICAL RECORD NUMBER Referred by Dr. Marisa Hua LEE: 05.02.22 Ocular Hx- VMT OD, PCIOL OU, YAG OU, Ocular HTN, Steroid Responder BCVA: OD 20/80+, OS 20/20   CURRENT MEDICATIONS: Current Outpatient Medications (Ophthalmic Drugs)  Medication Sig   cycloSPORINE (RESTASIS) 0.05 % ophthalmic emulsion Place 1 drop into both eyes 2 (two) times daily.   PROLENSA 0.07 % SOLN INSTILL 1 DROP INTO THE RIGHT EYE TWICE DAILY.   No current facility-administered medications for this visit. (Ophthalmic Drugs)   Current Outpatient Medications (Other)  Medication Sig   acetaminophen (TYLENOL) 500 MG tablet Take 500 mg by mouth every 6 (six) hours as needed for moderate pain or headache.   albuterol (VENTOLIN HFA) 108 (90 Base) MCG/ACT inhaler Inhale 2 puffs into the lungs every 6 (six) hours as needed for wheezing or shortness of breath.   ALPRAZolam (XANAX) 0.25 MG tablet TAKE 1/2 TO 1 TABLET BY MOUTH TWICE DAILY AS NEEDED FOR ANXIETY. (Patient taking differently: Take 0.625 mg by mouth daily as needed for anxiety.)   aspirin EC 81 MG  tablet Take 81 mg by mouth daily. Swallow whole.   CALCIUM PO Take 750 mg by mouth 2 (two) times daily with a meal.   cefdinir (OMNICEF) 300 MG capsule Take 1 capsule (300 mg total) by mouth 2 (two) times daily.   cholecalciferol (VITAMIN D3) 25 MCG (1000 UNIT) tablet Take 1,000 Units by mouth in the morning and at bedtime.   denosumab (PROLIA) 60 MG/ML SOSY injection Inject 60 mg into the skin every 6 (six) months.   EPINEPHRINE 0.3 mg/0.3 mL IJ SOAJ injection INJECT INTO OUTER THIGH AND HOLD AGAINST LEG FOR 10 SECONDS FOR SEVERE ALLERGIC REACTION.   estradiol (ESTRACE) 0.1 MG/GM vaginal cream Apply a pea size amount of cream to urethral area of vagina twice weekly   ibuprofen (ADVIL) 600 MG tablet Take 1 tablet (600 mg total) by mouth every 6 (six) hours as needed.   Misc Natural Products (ELDERBERRY IMMUNE COMPLEX PO) Take 1 capsule by mouth daily.   Multiple Vitamins-Minerals (MULTIVITAMIN WITH MINERALS) tablet Take 1 tablet by mouth daily.    omeprazole (PRILOSEC) 40 MG capsule Take 1 capsule (40 mg total) by mouth daily.   oxyCODONE-acetaminophen (PERCOCET/ROXICET) 5-325 MG tablet Take 1 tablet by mouth every 6 (six) hours as needed.   phenazopyridine (PYRIDIUM) 200 MG tablet Take 1 tablet (200 mg total) by mouth 3 (three) times daily as needed for pain.   polyethylene glycol-electrolytes (  NULYTELY) 420 g solution As directed   rosuvastatin (CRESTOR) 10 MG tablet Take one tablet (10 mg) by mouth on Monday, Wednesday, and Friday.   silver sulfADIAZINE (SILVADENE) 1 % cream Apply BID prn (Patient taking differently: Apply 1 application  topically 2 (two) times daily as needed (burns).)   Triamcinolone Acetonide (NASACORT ALLERGY 24HR NA) Place 2 sprays into the nose daily as needed (allergies).   Vaginal Lubricant (REPLENS) GEL Place 1 Application vaginally every 3 (three) days.   No current facility-administered medications for this visit. (Other)   REVIEW OF SYSTEMS: ROS   Positive for:  Gastrointestinal, Cardiovascular, Eyes Negative for: Constitutional, Neurological, Skin, Genitourinary, Musculoskeletal, HENT, Endocrine, Respiratory, Psychiatric, Allergic/Imm, Heme/Lymph Last edited by Theodore Demark, COA on 03/19/2022  9:50 AM.     ALLERGIES Allergies  Allergen Reactions   Ciprofloxacin Other (See Comments)    Patient states with in five minutes of taking developed a splitting headache,itching all over and burning in mouth and lips.   Augmentin [Amoxicillin-Pot Clavulanate] Diarrhea   Carafate [Sucralfate]     Mouth tingling   Celexa [Citalopram Hydrobromide] Nausea And Vomiting   Chantix [Varenicline] Nausea And Vomiting   Sulfa Antibiotics Itching   Sulfur Itching   Wellbutrin [Bupropion]     Severe weight loss and loss of appetite   Fosamax [Alendronate Sodium] Nausea And Vomiting    GERD can not tolerate   PAST MEDICAL HISTORY Past Medical History:  Diagnosis Date   Anxiety    COPD (chronic obstructive pulmonary disease) (HCC)    GERD (gastroesophageal reflux disease)    Hypercholesterolemia    Pre-diabetes    Reactive airway disease    Tachycardia    Past Surgical History:  Procedure Laterality Date   CATARACT EXTRACTION W/PHACO Left 01/17/2017   Procedure: CATARACT EXTRACTION PHACO AND INTRAOCULAR LENS PLACEMENT ([place]);  Surgeon: Tonny Branch, MD;  Location: [facility];  Service: Ophthalmology;  Laterality: Left;  CDE: 6.92   CATARACT EXTRACTION W/PHACO Right 03/07/2020   Procedure: CATARACT EXTRACTION PHACO AND INTRAOCULAR LENS PLACEMENT RIGHT EYE;  Surgeon: Baruch Goldmann, MD;  Location: [facility];  Service: Ophthalmology;  Laterality: Right;  CDE: 8.44   COLONOSCOPY  2013   RMR: Normal rectum. single diminutive sigmoid polyp noted above. Normal terminal ileum. Status post segmental biopsy. next TCS in 10 years.    COLONOSCOPY WITH PROPOFOL N/A 01/27/2022   Procedure: COLONOSCOPY WITH PROPOFOL;  Surgeon: Daneil Dolin, MD;  Location: [facility];   Service: Endoscopy;  Laterality: N/A;  12:45pm   ELBOW SURGERY Right 2023   ESOPHAGOGASTRODUODENOSCOPY  2013   RMR: Possible cervical esophageal web-status post dilation as described above. Small hiatal hernia. Status post biopsy of normal appearing small bowel to screen for celiac disease.    ESOPHAGOGASTRODUODENOSCOPY N/A 05/06/2016   normal esophagus, small hiatal hernia, empiric dilation.   ESOPHAGOGASTRODUODENOSCOPY (EGD) WITH PROPOFOL N/A 01/27/2022   Procedure: ESOPHAGOGASTRODUODENOSCOPY (EGD) WITH PROPOFOL;  Surgeon: Daneil Dolin, MD;  Location: [facility];  Service: Endoscopy;  Laterality: N/A;   EYE SURGERY Bilateral    Cat Sx - Symfony MF IOLs   MALONEY DILATION N/A 05/06/2016   Procedure: MALONEY DILATION;  Surgeon: Daneil Dolin, MD;  Location: [facility];  Service: Endoscopy;  Laterality: N/A;   MALONEY DILATION N/A 01/27/2022   Procedure: Venia Minks DILATION;  Surgeon: Daneil Dolin, MD;  Location: [facility];  Service: Endoscopy;  Laterality: N/A;   NECK SURGERY  10/2012   OVARIAN CYST SURGERY  1997   removed   POLYPECTOMY  01/27/2022   Procedure: POLYPECTOMY;  Surgeon: Daneil Dolin, MD;  Location: [facility];  Service: Endoscopy;;  cecal   TUBAL LIGATION  1478   YAG LASER APPLICATION Bilateral    FAMILY HISTORY Family History  Problem Relation Age of Onset   Colon cancer Maternal Grandfather 104   Colon polyps Mother 8   Colon cancer Maternal Uncle 104   Hypertension Father    Diabetes Father    Heart disease Father    Hyperlipidemia Father    Stroke Father    SOCIAL HISTORY Social History   Tobacco Use   Smoking status: Every Day    Packs/day: 1.00    Years: 25.00    Total pack years: 25.00    Types: Cigarettes   Smokeless tobacco: Never  Vaping Use   Vaping Use: Never used  Substance Use Topics   Alcohol use: No    Alcohol/week: 0.0 standard drinks of alcohol   Drug use: No       OPHTHALMIC EXAM:  Base Eye Exam     Visual  Acuity (Snellen - Linear)       Right Left   Dist [place] 20/25 20/20   Dist ph [place] NI          Tonometry (Tonopen, 9:48 AM)       Right Left   Pressure 19 21         Pupils       Dark Light Shape React APD   Right 3 2 Round Brisk None   Left 3 2 Round Brisk None         Visual Fields (Counting fingers)       Left Right    Full Full         Extraocular Movement       Right Left    Full, Ortho Full, Ortho         Neuro/Psych     Oriented x3: Yes   Mood/Affect: Normal         Dilation     Both eyes: 1.0% Mydriacyl, 2.5% Phenylephrine @ 9:48 AM           Slit Lamp and Fundus Exam     Slit Lamp Exam       Right Left   Lids/Lashes Dermatochalasis - upper lid, mild MGD Dermatochalasis - upper lid, mild MGD   Conjunctiva/Sclera nasal and temporal pinguecula, Conjunctivochalasis IT nasal and temporal pinguecula, Conjunctivochalasis IT   Cornea well healed cataract wound well healed cataract wound   Anterior Chamber deep and clear, No cell or flare, rare pigment deep and clear   Iris Round and moderately dilated Round and moderately dilated   Lens MF PC IOL in good position with open PC MF PC IOL in good position with open PC   Anterior Vitreous Vitreous syneresis Vitreous syneresis         Fundus Exam       Right Left   Disc Pink and Sharp Pink and Sharp   C/D Ratio 0.3 0.3   Macula Flat, Good foveal reflex, mild RPE mottling, trace, persistent central cyst, +ERM, No heme or edema Flat, good foveal reflex, mild RPE mottling, No heme or edema   Vessels attenuated, Tortuous, mild copper wiring mild attenuation, mild copper wiring   Periphery Attached Attached           IMAGING AND PROCEDURES  Imaging and Procedures for 03/19/2022  OCT,  Retina - OU - Both Eyes       Right Eye Quality was good. Central Foveal Thickness: 297. Progression has improved. Findings include no SRF, abnormal foveal contour, epiretinal membrane, intraretinal fluid  (Stable release of central VMT to partial PVD, mild interval improvement in central macular cyst remains).   Left Eye Quality was good. Central Foveal Thickness: 292. Progression has been stable. Findings include normal foveal contour, no IRF, no SRF (Partial PVD).   Notes *Images captured and stored on drive  Diagnosis / Impression:  OD: Stable release of central VMT to partial PVD, mild interval improvement in central macular cyst OS: NFP, no IRF/SRF  Clinical management:  See below  Abbreviations: NFP - Normal foveal profile. CME - cystoid macular edema. PED - pigment epithelial detachment. IRF - intraretinal fluid. SRF - subretinal fluid. EZ - ellipsoid zone. ERM - epiretinal membrane. ORA - outer retinal atrophy. ORT - outer retinal tubulation. SRHM - subretinal hyper-reflective material. IRHM - intraretinal hyper-reflective material           ASSESSMENT/PLAN:   ICD-10-CM   1. Retinal edema  H35.81 OCT, Retina - OU - Both Eyes    2. Vitreomacular adhesion of right eye  H43.821     3. Epiretinal membrane (ERM) of right eye  H35.371     4. CME (cystoid macular edema), right  H35.351     5. Pseudophakia, both eyes  Z96.1       1-4. Central retinal edema OD -- improving  - central cystic changes -- likely multifactorial  - ERM and VMT possibly contributing -- VMT now released to partial PVD  - pt reports history of prolonged inflammation post cataract surgeries and YAGs OU -- [place]  - suspect portion of edema OD driven by CME post YAG cap OD (on 4.18.22)  - OCT shows stable release of central VMT to partial PVD, interval improvement in central macular cyst remains on just Prolensa BID (PF tapered off)  - BCVA 20/25 OD -- stable  - FA (07.08.22) shows mild, late perifoveal staining; ?mild hyperfluorescence of disc  - cont Prolensa BID OD -- for CME component  - IOP okay today OD at 19 -- now off Simbrinza BID OD and Vyzulta QHS OD, per Dr. Marisa Hua (likely steroid  response)  - f/u in 4 months -- DFE/OCT  5. Pseudophakia OU  - s/p CE/IOL OU (OD - Dr. Marisa Hua, 7.30.2021; OS - Dr. Geoffry Paradise, 6.11.2018)  - IOLs in good position, doing well  - s/p YAG OU (OD 4.18.22)  - monitor  Ophthalmic Meds Ordered this visit:  No orders of the defined types were placed in this encounter.    Return in about 6 months (around 09/19/2022) for f/u central edema OD, DFE, OCT.  There are no Patient Instructions on file for this visit.  This document serves as a record of services personally performed by Gardiner Sleeper, MD, PhD. It was created on their behalf by San Jetty. Owens Shark, OA an ophthalmic technician. The creation of this record is the provider's dictation and/or activities during the visit.    Electronically signed by: San Jetty. Owens Shark, [place] 08.08.2023 12:31 AM   Gardiner Sleeper, M.D., Ph.D. Diseases & Surgery of the Retina and Vitreous [facility]  I have reviewed the above documentation for accuracy and completeness, and I agree with the above. Gardiner Sleeper, M.D., Ph.D. 03/21/22 12:31 AM   Abbreviations: M myopia (nearsighted); A astigmatism; H hyperopia (farsighted); P  presbyopia; Mrx spectacle prescription;  CTL contact lenses; OD right eye; OS left eye; OU both eyes  XT exotropia; ET esotropia; PEK punctate epithelial keratitis; PEE punctate epithelial erosions; DES dry eye syndrome; MGD meibomian gland dysfunction; ATs artificial tears; PFAT's preservative free artificial tears; [place] nuclear sclerotic cataract; PSC posterior subcapsular cataract; ERM epi-retinal membrane; PVD posterior vitreous detachment; RD retinal detachment; DM diabetes mellitus; DR diabetic retinopathy; NPDR non-proliferative diabetic retinopathy; PDR proliferative diabetic retinopathy; CSME clinically significant macular edema; DME diabetic macular edema; dbh dot blot hemorrhages; CWS cotton wool spot; POAG primary open angle glaucoma; C/D cup-to-disc ratio; HVF  humphrey visual field; GVF goldmann visual field; OCT optical coherence tomography; IOP intraocular pressure; BRVO Branch retinal vein occlusion; CRVO central retinal vein occlusion; CRAO central retinal artery occlusion; BRAO branch retinal artery occlusion; RT retinal tear; SB scleral buckle; PPV pars plana vitrectomy; VH Vitreous hemorrhage; PRP panretinal laser photocoagulation; IVK intravitreal kenalog; VMT vitreomacular traction; MH Macular hole;  NVD neovascularization of the disc; NVE neovascularization elsewhere; AREDS age related eye disease study; ARMD age related macular degeneration; POAG primary open angle glaucoma; EBMD epithelial/anterior basement membrane dystrophy; ACIOL anterior chamber intraocular lens; IOL intraocular lens; PCIOL posterior chamber intraocular lens; Phaco/IOL phacoemulsification with intraocular lens placement; Lupus photorefractive keratectomy; LASIK laser assisted in situ keratomileusis; HTN hypertension; DM diabetes mellitus; COPD chronic obstructive pulmonary disease

## 2022-03-19 ENCOUNTER — Encounter (INDEPENDENT_AMBULATORY_CARE_PROVIDER_SITE_OTHER): Payer: Self-pay | Admitting: Ophthalmology

## 2022-03-19 ENCOUNTER — Ambulatory Visit (INDEPENDENT_AMBULATORY_CARE_PROVIDER_SITE_OTHER): Payer: BC Managed Care – PPO | Admitting: Ophthalmology

## 2022-03-19 DIAGNOSIS — H43821 Vitreomacular adhesion, right eye: Secondary | ICD-10-CM | POA: Diagnosis not present

## 2022-03-19 DIAGNOSIS — H35351 Cystoid macular degeneration, right eye: Secondary | ICD-10-CM | POA: Diagnosis not present

## 2022-03-19 DIAGNOSIS — Z961 Presence of intraocular lens: Secondary | ICD-10-CM

## 2022-03-19 DIAGNOSIS — H35033 Hypertensive retinopathy, bilateral: Secondary | ICD-10-CM

## 2022-03-19 DIAGNOSIS — H35371 Puckering of macula, right eye: Secondary | ICD-10-CM

## 2022-03-19 DIAGNOSIS — H3581 Retinal edema: Secondary | ICD-10-CM

## 2022-03-21 ENCOUNTER — Encounter (INDEPENDENT_AMBULATORY_CARE_PROVIDER_SITE_OTHER): Payer: Self-pay | Admitting: Ophthalmology

## 2022-03-22 ENCOUNTER — Other Ambulatory Visit: Payer: Self-pay

## 2022-03-22 DIAGNOSIS — Z122 Encounter for screening for malignant neoplasm of respiratory organs: Secondary | ICD-10-CM

## 2022-03-22 DIAGNOSIS — Z87891 Personal history of nicotine dependence: Secondary | ICD-10-CM

## 2022-04-16 LAB — COMPREHENSIVE METABOLIC PANEL
ALT: 11 IU/L (ref 0–32)
AST: 16 IU/L (ref 0–40)
Albumin/Globulin Ratio: 2.9 — ABNORMAL HIGH (ref 1.2–2.2)
Albumin: 4.3 g/dL (ref 3.8–4.9)
Alkaline Phosphatase: 57 IU/L (ref 44–121)
BUN/Creatinine Ratio: 28 — ABNORMAL HIGH (ref 9–23)
BUN: 14 mg/dL (ref 6–24)
Bilirubin Total: 0.2 mg/dL (ref 0.0–1.2)
CO2: 22 mmol/L (ref 20–29)
Calcium: 9 mg/dL (ref 8.7–10.2)
Chloride: 106 mmol/L (ref 96–106)
Creatinine, Ser: 0.5 mg/dL — ABNORMAL LOW (ref 0.57–1.00)
Globulin, Total: 1.5 g/dL (ref 1.5–4.5)
Glucose: 116 mg/dL — ABNORMAL HIGH (ref 70–99)
Potassium: 3.9 mmol/L (ref 3.5–5.2)
Sodium: 143 mmol/L (ref 134–144)
Total Protein: 5.8 g/dL — ABNORMAL LOW (ref 6.0–8.5)
eGFR: 109 mL/min/{1.73_m2} (ref >59)

## 2022-04-16 LAB — VITAMIN D 25 HYDROXY (VIT D DEFICIENCY, FRACTURES): Vit D, 25-Hydroxy: 24.7 ng/mL — ABNORMAL LOW (ref 30.0–100.0)

## 2022-04-22 ENCOUNTER — Ambulatory Visit (HOSPITAL_COMMUNITY)
Admission: RE | Admit: 2022-04-22 | Discharge: 2022-04-22 | Disposition: A | Discharge status: Home / Self Care | Payer: BC Managed Care – PPO | Source: Ambulatory Visit | Attending: Hematology | Admitting: Hematology

## 2022-04-22 DIAGNOSIS — Z87891 Personal history of nicotine dependence: Secondary | ICD-10-CM | POA: Diagnosis present

## 2022-04-22 DIAGNOSIS — Z122 Encounter for screening for malignant neoplasm of respiratory organs: Secondary | ICD-10-CM | POA: Insufficient documentation

## 2022-04-26 ENCOUNTER — Other Ambulatory Visit: Payer: BC Managed Care – PPO

## 2022-04-26 ENCOUNTER — Telehealth: Payer: Self-pay

## 2022-04-26 ENCOUNTER — Other Ambulatory Visit: Payer: Self-pay

## 2022-04-26 DIAGNOSIS — R300 Dysuria: Secondary | ICD-10-CM

## 2022-04-26 LAB — URINALYSIS, ROUTINE W REFLEX MICROSCOPIC
Bilirubin, UA: NEGATIVE
Glucose, UA: NEGATIVE
Ketones, UA: NEGATIVE
Nitrite, UA: NEGATIVE
Specific Gravity, UA: 1.03 (ref 1.005–1.030)
Urobilinogen, Ur: 0.2 mg/dL (ref 0.2–1.0)
pH, UA: 5 (ref 5.0–7.5)

## 2022-04-26 LAB — MICROSCOPIC EXAMINATION
Bacteria, UA: NONE SEEN
RBC, Urine: >30 /hpf — AB (ref 0–2)

## 2022-04-26 NOTE — Telephone Encounter (Signed)
Made patient aware that her urine was clear. Patient voiced that she wants an OV because she has gross hematuria and burring with urinary. Made patient aware that I scheduled her an OV and she voiced understanding.

## 2022-04-26 NOTE — Telephone Encounter (Signed)
-----   Message from Reynaldo Minium, [place] sent at 04/26/2022  1:14 PM EDT ----- Please let pt know her urine shows no sign of infection. She needs OV if she has further concerns.

## 2022-04-27 ENCOUNTER — Other Ambulatory Visit: Payer: Self-pay

## 2022-04-27 ENCOUNTER — Ambulatory Visit
Admission: EM | Admit: 2022-04-27 | Discharge: 2022-04-27 | Disposition: A | Discharge status: Home / Self Care | Payer: BC Managed Care – PPO | Attending: Family Medicine | Admitting: Family Medicine

## 2022-04-27 ENCOUNTER — Telehealth: Payer: Self-pay | Admitting: Family Medicine

## 2022-04-27 ENCOUNTER — Ambulatory Visit: Payer: Self-pay

## 2022-04-27 ENCOUNTER — Encounter: Payer: Self-pay | Admitting: Emergency Medicine

## 2022-04-27 ENCOUNTER — Other Ambulatory Visit (HOSPITAL_COMMUNITY): Payer: Self-pay

## 2022-04-27 ENCOUNTER — Ambulatory Visit (INDEPENDENT_AMBULATORY_CARE_PROVIDER_SITE_OTHER): Payer: BC Managed Care – PPO

## 2022-04-27 DIAGNOSIS — Z87442 Personal history of urinary calculi: Secondary | ICD-10-CM | POA: Insufficient documentation

## 2022-04-27 DIAGNOSIS — R310 Gross hematuria: Secondary | ICD-10-CM

## 2022-04-27 DIAGNOSIS — M818 Other osteoporosis without current pathological fracture: Secondary | ICD-10-CM

## 2022-04-27 DIAGNOSIS — R109 Unspecified abdominal pain: Secondary | ICD-10-CM

## 2022-04-27 DIAGNOSIS — R300 Dysuria: Secondary | ICD-10-CM

## 2022-04-27 DIAGNOSIS — R319 Hematuria, unspecified: Secondary | ICD-10-CM | POA: Diagnosis not present

## 2022-04-27 LAB — POCT URINALYSIS DIP (MANUAL ENTRY)
Bilirubin, UA: NEGATIVE
Glucose, UA: NEGATIVE mg/dL
Ketones, POC UA: NEGATIVE mg/dL
Nitrite, UA: NEGATIVE
Protein Ur, POC: 30 mg/dL — AB
Spec Grav, UA: 1.025 (ref 1.010–1.025)
Urobilinogen, UA: 0.2 E.U./dL
pH, UA: 5.5 (ref 5.0–8.0)

## 2022-04-27 MED ORDER — DENOSUMAB 60 MG/ML ~~LOC~~ SOSY
60.0000 mg | PREFILLED_SYRINGE | SUBCUTANEOUS | 1 refills | Status: DC
  Filled 2022-04-27: qty 1, 180d supply, fill #0

## 2022-04-27 MED ORDER — TRAMADOL HCL 50 MG PO TABS: 50.0000 mg | ORAL_TABLET | Freq: Two times a day (BID) | ORAL | 0 refills | Status: DC | PRN

## 2022-04-27 MED ORDER — CEPHALEXIN 500 MG PO CAPS: 500.0000 mg | ORAL_CAPSULE | Freq: Two times a day (BID) | ORAL | 0 refills | Status: DC

## 2022-04-27 NOTE — ED Provider Notes (Signed)
[facility]    CSN: [id number] Arrival date & time: 04/27/22  0851      History   Chief Complaint Chief Complaint  Patient presents with   Hematuria    HPI Isabel Atkinson is a 56 y.o. female.   Patient presenting today with 1 day history of chills, left flank pain, gross hematuria, dysuria, suprapubic pressure.  Denies nausea, vomiting, fever, or urinary incontinence, bowel changes.  Called her urologist yesterday who checked a urine sample but was unable to give her a an appointment.  Per the urology clinic, she did not appear to have a urinary tract infection and was scheduled for an office visit on 05/03/2022.  Patient could not wait that long so is presenting here today hoping to be further evaluated.  She is so far not taking anything other than Tylenol for her symptoms which is not helping.    Past Medical History:  Diagnosis Date   Anxiety    COPD (chronic obstructive pulmonary disease) (HCC)    GERD (gastroesophageal reflux disease)    Hypercholesterolemia    Pre-diabetes    Reactive airway disease    Tachycardia     Patient Active Problem List   Diagnosis Date Noted   Hematuria 11/17/2021   Encounter for screening colonoscopy 11/03/2021   Coronary artery disease involving native heart without angina pectoris 02/26/2020   Aortic atherosclerosis ([place]) 02/26/2020   Current smoker 12/04/2019   Neurodermatitis 06/28/2016   COPD (chronic obstructive pulmonary disease) ([place]) 06/10/2016   Osteoporosis 07/20/2014   Irritable bowel syndrome 07/05/2014   Esophageal dysphagia 01/11/2012   GERD (gastroesophageal reflux disease) 01/11/2012   Chronic diarrhea 01/11/2012    Past Surgical History:  Procedure Laterality Date   CATARACT EXTRACTION W/PHACO Left 01/17/2017   Procedure: CATARACT EXTRACTION PHACO AND INTRAOCULAR LENS PLACEMENT (IOC);  Surgeon: Tonny Branch, MD;  Location: [facility];  Service: Ophthalmology;  Laterality: Left;  CDE: 6.92   CATARACT  EXTRACTION W/PHACO Right 03/07/2020   Procedure: CATARACT EXTRACTION PHACO AND INTRAOCULAR LENS PLACEMENT RIGHT EYE;  Surgeon: Baruch Goldmann, MD;  Location: [facility];  Service: Ophthalmology;  Laterality: Right;  CDE: 8.44   COLONOSCOPY  2013   RMR: Normal rectum. single diminutive sigmoid polyp noted above. Normal terminal ileum. Status post segmental biopsy. next TCS in 10 years.    COLONOSCOPY WITH PROPOFOL N/A 01/27/2022   Procedure: COLONOSCOPY WITH PROPOFOL;  Surgeon: Daneil Dolin, MD;  Location: [facility];  Service: Endoscopy;  Laterality: N/A;  12:45pm   ELBOW SURGERY Right 2023   ESOPHAGOGASTRODUODENOSCOPY  2013   RMR: Possible cervical esophageal web-status post dilation as described above. Small hiatal hernia. Status post biopsy of normal appearing small bowel to screen for celiac disease.    ESOPHAGOGASTRODUODENOSCOPY N/A 05/06/2016   normal esophagus, small hiatal hernia, empiric dilation.   ESOPHAGOGASTRODUODENOSCOPY (EGD) WITH PROPOFOL N/A 01/27/2022   Procedure: ESOPHAGOGASTRODUODENOSCOPY (EGD) WITH PROPOFOL;  Surgeon: Daneil Dolin, MD;  [facility]: [facility];  Service: Endoscopy;  Laterality: N/A;   EYE SURGERY Bilateral    Cat Sx - Symfony MF IOLs   MALONEY DILATION N/A 05/06/2016   Procedure: MALONEY DILATION;  Surgeon: Daneil Dolin, MD;  Location: [facility];  Service: Endoscopy;  Laterality: N/A;   MALONEY DILATION N/A 01/27/2022   Procedure: Venia Minks DILATION;  Surgeon: Daneil Dolin, MD;  Location: [facility];  Service: Endoscopy;  Laterality: N/A;   NECK SURGERY  10/2012   OVARIAN CYST SURGERY  1997  removed   POLYPECTOMY  01/27/2022   Procedure: POLYPECTOMY;  Surgeon: Daneil Dolin, MD;  Location: [facility];  Service: Endoscopy;;  cecal   TUBAL LIGATION  6269   YAG LASER APPLICATION Bilateral     OB History   No obstetric history on file.      Home Medications    Prior to Admission medications   Medication Sig Start Date End Date  Taking? Authorizing Provider  cephALEXin (KEFLEX) 500 MG capsule Take 1 capsule (500 mg total) by mouth 2 (two) times daily. 04/27/22  Yes Volney American, PA-C  traMADol (ULTRAM) 50 MG tablet Take 1 tablet (50 mg total) by mouth every 12 (twelve) hours as needed. 04/27/22  Yes Volney American, PA-C  acetaminophen (TYLENOL) 500 MG tablet Take 500 mg by mouth every 6 (six) hours as needed for moderate pain or headache.    [provider]  albuterol (VENTOLIN HFA) 108 (90 Base) MCG/ACT inhaler Inhale 2 puffs into the lungs every 6 (six) hours as needed for wheezing or shortness of breath. 08/21/20   Chalmers Guest, NP  ALPRAZolam (XANAX) 0.25 MG tablet TAKE 1/2 TO 1 TABLET BY MOUTH TWICE DAILY AS NEEDED FOR ANXIETY. Patient taking differently: Take 0.625 mg by mouth daily as needed for anxiety. 02/04/20   Kathyrn Drown, MD  aspirin EC 81 MG tablet Take 81 mg by mouth daily. Swallow whole.    [provider]  CALCIUM PO Take 750 mg by mouth 2 (two) times daily with a meal.    [provider]  cefdinir (OMNICEF) 300 MG capsule Take 1 capsule (300 mg total) by mouth 2 (two) times daily. 11/17/21   Coral Spikes, DO  cholecalciferol (VITAMIN D3) 25 MCG (1000 UNIT) tablet Take 1,000 Units by mouth in the morning and at bedtime.    [provider]  cycloSPORINE (RESTASIS) 0.05 % ophthalmic emulsion Place 1 drop into both eyes 2 (two) times daily.    [provider]  denosumab (PROLIA) 60 MG/ML SOSY injection Inject 60 mg into the skin every 6 (six) months.    [provider]  EPINEPHRINE 0.3 mg/0.3 mL IJ SOAJ injection INJECT INTO OUTER THIGH AND HOLD AGAINST LEG FOR 10 SECONDS FOR SEVERE ALLERGIC REACTION. 05/11/21   Kathyrn Drown, MD  estradiol (ESTRACE) 0.1 MG/GM vaginal cream Apply a pea size amount of cream to urethral area of vagina twice weekly 03/03/22   Summerlin, Berneice Heinrich, PA-C  ibuprofen (ADVIL) 600 MG tablet Take 1 tablet  (600 mg total) by mouth every 6 (six) hours as needed. 11/24/21   Kathyrn Drown, MD  Misc Natural Products (ELDERBERRY IMMUNE COMPLEX PO) Take 1 capsule by mouth daily.    [provider]  Multiple Vitamins-Minerals (MULTIVITAMIN WITH MINERALS) tablet Take 1 tablet by mouth daily.     [provider]  omeprazole (PRILOSEC) 40 MG capsule Take 1 capsule (40 mg total) by mouth daily. 11/03/21   Annitta Needs, NP  oxyCODONE-acetaminophen (PERCOCET/ROXICET) 5-325 MG tablet Take 1 tablet by mouth every 6 (six) hours as needed. 11/24/21   Kathyrn Drown, MD  phenazopyridine (PYRIDIUM) 200 MG tablet Take 1 tablet (200 mg total) by mouth 3 (three) times daily as needed for pain. 11/10/21   Volney American, PA-C  polyethylene glycol-electrolytes (NULYTELY) 420 g solution As directed 12/18/21   Rourk, Cristopher Estimable, MD  PROLENSA 0.07 % SOLN INSTILL 1 DROP INTO THE RIGHT EYE TWICE DAILY. 09/04/21  Bernarda Caffey, MD  rosuvastatin (CRESTOR) 10 MG tablet Take one tablet (10 mg) by mouth on Monday, Wednesday, and Friday. 12/07/21   Fay Records, MD  silver sulfADIAZINE (SILVADENE) 1 % cream Apply BID prn Patient taking differently: Apply 1 application  topically 2 (two) times daily as needed (burns). 11/08/19   Kathyrn Drown, MD  Triamcinolone Acetonide (NASACORT ALLERGY 24HR NA) Place 2 sprays into the nose daily as needed (allergies).    [provider]  Vaginal Lubricant (REPLENS) GEL Place 1 Application vaginally every 3 (three) days.    [provider]    Family History Family History  Problem Relation Age of Onset   Colon cancer Maternal Grandfather 48   Colon polyps Mother 40   Colon cancer Maternal Uncle 4   Hypertension Father    Diabetes Father    Heart disease Father    Hyperlipidemia Father    Stroke Father     Social History Social History   Tobacco Use   Smoking status: Every Day    Packs/day: 1.00    Years: 25.00    Total pack years: 25.00     Types: Cigarettes   Smokeless tobacco: Never  Vaping Use   Vaping Use: Never used  Substance Use Topics   Alcohol use: No    Alcohol/week: 0.0 standard drinks of alcohol   Drug use: No     Allergies   Ciprofloxacin, Augmentin [amoxicillin-pot clavulanate], Carafate [sucralfate], Celexa [citalopram hydrobromide], Chantix [varenicline], Sulfa antibiotics, Sulfur, Wellbutrin [bupropion], and Fosamax [alendronate sodium]   Review of Systems Review of Systems Per HPI  Physical Exam Triage Vital Signs ED Triage Vitals [04/27/22 0904]  Enc Vitals Group     BP (!) 149/83     Pulse Rate 83     Resp 20     Temp 98.5 F (36.9 C)     Temp Source Oral     SpO2 99 %     Weight      Height      Head Circumference      Peak Flow      Pain Score 4     Pain Loc      Pain Edu?      Excl. in [place]?    No data found.  Updated Vital Signs BP (!) 149/83 (BP Location: Right Arm)   Pulse 83   Temp 98.5 F (36.9 C) (Oral)   Resp 20   SpO2 99%   Visual Acuity Right Eye Distance:   Left Eye Distance:   Bilateral Distance:    Right Eye Near:   Left Eye Near:    Bilateral Near:     Physical Exam Vitals and nursing note reviewed.  Constitutional:      Appearance: Normal appearance. She is not ill-appearing.  HENT:     Head: Atraumatic.     Mouth/Throat:     Mouth: Mucous membranes are moist.  Eyes:     Extraocular Movements: Extraocular movements intact.     Conjunctiva/sclera: Conjunctivae normal.  Cardiovascular:     Rate and Rhythm: Normal rate and regular rhythm.     Heart sounds: Normal heart sounds.  Pulmonary:     Effort: Pulmonary effort is normal.     Breath sounds: Normal breath sounds.  Abdominal:     General: Bowel sounds are normal. There is no distension.     Palpations: Abdomen is soft.     Tenderness: There is abdominal tenderness. There is no right  CVA tenderness, left CVA tenderness or guarding.     Comments: Mild suprapubic tenderness to palpation  without distention or guarding  Musculoskeletal:        General: Normal range of motion.     Cervical back: Normal range of motion and neck supple.  Skin:    General: Skin is warm and dry.  Neurological:     Mental Status: She is alert and oriented to person, place, and time.     Motor: No weakness.     Gait: Gait normal.  Psychiatric:        Mood and Affect: Mood normal.        Thought Content: Thought content normal.        Judgment: Judgment normal.    UC Treatments / Results  Labs (all labs ordered are listed, but only abnormal results are displayed) Labs Reviewed  POCT URINALYSIS DIP (MANUAL ENTRY) - Abnormal; Notable for the following components:      Result Value   Blood, UA large (*)    Protein Ur, POC =30 (*)    Leukocytes, UA Small (1+) (*)    All other components within normal limits  URINE CULTURE   EKG  Radiology DG Abd 1 View  Result Date: 04/27/2022 CLINICAL DATA:  left flank pain, hematuria, dysuria, hx of kidney stones EXAM: ABDOMEN - 1 VIEW COMPARISON:  11/17/2021 FINDINGS: The bowel gas pattern is normal. Hepatomegaly. No radio-opaque calculi or other significant radiographic abnormality are seen. Moderate-large volume of stool throughout the colon. IMPRESSION: 1. No radiopaque calculi identified. 2. Hepatomegaly. 3. Moderate-large volume of stool throughout the colon. Electronically Signed   By: Davina Poke D.O.   On: 04/27/2022 09:35    Procedures Procedures (including critical care time)  Medications Ordered in UC Medications - No data to display  Initial Impression / Assessment and Plan / UC Course  I have reviewed the triage vital signs and the nursing notes.  Pertinent labs & imaging results that were available during my care of the patient were reviewed by me and considered in my medical decision making (see chart for details).     Mildly hypertensive in triage today, otherwise vital signs reassuring.  She is overall well-appearing and in  no acute distress.  Her urine again today shows RBCs, 1+ leuks, protein.  Urine culture is pending for further evaluation.  She requested abdominal ultrasound to check for kidney stones, this was negative for evidence of kidney stones but she is aware that this is not a reliable tool to detect kidney stones.  Discussed that we would provide a small supply of pain medicine to help with flank pain, PDMP reviewed and appropriate.  We will also treat with Keflex in case urinary tract infection developing and have her follow-up on the 25th as scheduled with urology.  ED for worsening symptoms in the meantime.  Work note given.  Final Clinical Impressions(s) / UC Diagnoses   Final diagnoses:  Left flank pain  Dysuria  Gross hematuria  History of kidney stones     Discharge Instructions      I have sent over an antibiotic while we wait for your urine culture and some pain medicine to take as needed for severe pain episodes.  Your x-ray today did not show any kidney stones but as discussed without a CT we are unable to confirm or deny if that is what is going on with your symptoms.  Drink plenty of fluids, follow-up as scheduled with your  urologist.    ED Prescriptions     Medication Sig Dispense Auth. Provider   cephALEXin (KEFLEX) 500 MG capsule Take 1 capsule (500 mg total) by mouth 2 (two) times daily. 10 capsule Volney American, PA-C   traMADol (ULTRAM) 50 MG tablet Take 1 tablet (50 mg total) by mouth every 12 (twelve) hours as needed. 10 tablet Volney American, [place]      I have reviewed the PDMP during this encounter.   Volney American, [place] 04/27/22 1030

## 2022-04-27 NOTE — Telephone Encounter (Signed)
Patient has gone to Urgent care this morning and Found out she has a kidney stone would like you to order CT scan. Please advise

## 2022-04-27 NOTE — Telephone Encounter (Signed)
Nurses Unfortunately it is not that simple She would need to do an office visit with [place] next week then do our assessment and then certainly if we agree that she has a kidney stone we can order the CT scan She is already had a [facility] x-ray Please schedule her with Docia Barrier or Dr. Lacinda Axon for this week they can order his CT scan and move forward from there depending on the clinical situation

## 2022-04-27 NOTE — ED Triage Notes (Addendum)
Pt reports chills, left lower back pain that radiates to LLQ for last several days. Pt reports hematuria in urine and reports was seen at urologist office yesterday and urine was tested with no infected detected. Pt reports was unable to see provider and would like to be evaluated for what could be causing pain. Pt denies any known fevers.   Pt reports would like another urine sample to be tested at today's visit.

## 2022-04-27 NOTE — Discharge Instructions (Signed)
I have sent over an antibiotic while we wait for your urine culture and some pain medicine to take as needed for severe pain episodes.  Your x-ray today did not show any kidney stones but as discussed without a CT we are unable to confirm or deny if that is what is going on with your symptoms.  Drink plenty of fluids, follow-up as scheduled with your urologist.

## 2022-04-28 ENCOUNTER — Other Ambulatory Visit (HOSPITAL_COMMUNITY): Payer: Self-pay

## 2022-04-28 ENCOUNTER — Telehealth: Payer: Self-pay

## 2022-04-28 NOTE — Telephone Encounter (Signed)
Left a message requesting pt return call to the office. ?

## 2022-04-28 NOTE — Telephone Encounter (Signed)
Pt called back. You can call her at [phone]

## 2022-04-28 NOTE — Telephone Encounter (Signed)
Spoke with patient going to wait on appointment feeling a little better has appointment with Dr. Jeffie Pollock on 9/25

## 2022-04-29 ENCOUNTER — Other Ambulatory Visit: Payer: Self-pay

## 2022-04-29 DIAGNOSIS — M818 Other osteoporosis without current pathological fracture: Secondary | ICD-10-CM

## 2022-04-29 LAB — URINE CULTURE: Culture: >=100000 — AB

## 2022-04-29 MED ORDER — DENOSUMAB 60 MG/ML ~~LOC~~ SOSY: 60.0000 mg | PREFILLED_SYRINGE | SUBCUTANEOUS | 1 refills | Status: DC

## 2022-04-29 NOTE — Telephone Encounter (Signed)
Spoke with pt, advised her that her prolia Rx would need to go to [facility] per her insurance. Pt voiced understanding. Rx for prolia '60mg'$  sent to [facility].

## 2022-04-30 LAB — URINE CULTURE

## 2022-05-03 ENCOUNTER — Ambulatory Visit: Payer: BC Managed Care – PPO | Admitting: Physician Assistant

## 2022-05-03 VITALS — BP 122/74 | HR 72

## 2022-05-03 DIAGNOSIS — R300 Dysuria: Secondary | ICD-10-CM | POA: Diagnosis not present

## 2022-05-03 DIAGNOSIS — N952 Postmenopausal atrophic vaginitis: Secondary | ICD-10-CM

## 2022-05-03 DIAGNOSIS — Z87440 Personal history of urinary (tract) infections: Secondary | ICD-10-CM

## 2022-05-03 DIAGNOSIS — R310 Gross hematuria: Secondary | ICD-10-CM

## 2022-05-03 DIAGNOSIS — R311 Benign essential microscopic hematuria: Secondary | ICD-10-CM | POA: Diagnosis not present

## 2022-05-03 LAB — BLADDER SCAN AMB NON-IMAGING: Scan Result: 17

## 2022-05-03 MED ORDER — CEPHALEXIN 500 MG PO CAPS: 500.0000 mg | ORAL_CAPSULE | Freq: Two times a day (BID) | ORAL | 0 refills | Status: DC

## 2022-05-03 NOTE — Progress Notes (Signed)
post void residual =78m

## 2022-05-03 NOTE — Progress Notes (Signed)
Assessment: 1. Benign essential microscopic hematuria - Urinalysis, Routine w reflex microscopic - BLADDER SCAN AMB NON-IMAGING - Urine Culture  2. Atrophic vaginitis  3. Personal history of urinary infection  4. Dysuria    Plan: Pt will resume Keflex until today's cx results. Following resolution of current UTI, will initiate estrace cream previously Rx as discussed. FU in 6 months, sooner if she has concerns. Pending cx, will consider HS prophylaxis for UTIs  Meds ordered this encounter  Medications   cephALEXin (KEFLEX) 500 MG capsule    Sig: Take 1 capsule (500 mg total) by mouth 2 (two) times daily.    Dispense:  10 capsule    Refill:  0     Chief Complaint: No chief complaint on file.   HPI: Isabel Atkinson is a 56 y.o. female who presents for continued evaluation of dysuria, burning, and hematuria.  September 8, the patient dropped off a urine stating she had UTI symptoms.  Microscopic exam revealed no bacteria or pyuria with greater than 30 RBCs.  Culture was obtained and positive for Proteus mirabilis and E. Coli as noted below. Pt tx with Keflex by UC and sxs resolved somewhat. Pt completed Rx this week. Further hx obtained indicating pt has intercourse 4-5x/week and has some discomfort with it. Always uses lubrication and voids immediately after. Does not douche. No tub baths. HRT discussed in the past and pt wants to consider.  UA=3-10 RBCs PVR=20m   03/03/22 Isabel WINGETis a 56y.o. female with h/o micro hematuria and recurrent UTIs with negative cysto on 5/25 who presents for evaluation of an increase in urinary frequency and ongoing, long-term burning in the urethral and vaginal areas.  She denies dysuria, new incontinence symptoms, fever, chills, abdominal pain.  No gross hematuria. Pt has discussed HRT in the past, but has been reluctant to initiate.  Early menopause in her mid 426s Most recent cx was negative on 5/18.   UA = 0-5 WBCs, 11-30 RBCs,  few bacteria, nitrite negative PVR = 0 mL   12/31/21: Isabel Atkinson today for cystoscopy for further evaluation of her microhematuria.   Her UA remains positive for blood.    12/24/21: CTerikais a [age]yo former patient with a history of stones.  She had left flank pain with a febrile UTI in early  April and it took 3 weeks to clear up.  She had a pan sensitive e. Coli on culture on 11/10/21. She got macrobid for a week and then Cefdinir for a week.   Her pain was severe and has been slow to resolve.  The flank pain has resolved but the back pain persists.   She had no nausea or vomiting.   She has mild irritation with voiding now.   She has 6-10 WBC's, 11-30 RBC's and a few bacteria.  She has chronic microhematuria but it increased with the UTI.  She had a UTI with Klebsiella and Enterococcus in 2021.  She had a negative CT on 11/17/21 and a negative renal UKoreaon 11/27/21.  She does have some scoliosis and a suggestion of lumbar DDD.  She has no incontinence. She is a smoker.      Cx=04/27/22->=100,000 COLONIES/mL PROTEUS MIRABILIS resistant to Macrobid and tetracycline.  25-50 K colonies of E. coli resistant only to tetracycline. Pt tx with Keflex 7/26 cx No growth Portions of the above documentation were copied from a prior visit for review purposes only.  Allergies: Allergies  Allergen Reactions  Ciprofloxacin Other (See Comments)    Patient states with in five minutes of taking developed a splitting headache,itching all over and burning in mouth and lips.   Augmentin [Amoxicillin-Pot Clavulanate] Diarrhea   Carafate [Sucralfate]     Mouth tingling   Celexa [Citalopram Hydrobromide] Nausea And Vomiting   Chantix [Varenicline] Nausea And Vomiting   Sulfa Antibiotics Itching   Sulfur Itching   Wellbutrin [Bupropion]     Severe weight loss and loss of appetite   Fosamax [Alendronate Sodium] Nausea And Vomiting    GERD can not tolerate    PMH: Past Medical History:  Diagnosis Date   Anxiety     COPD (chronic obstructive pulmonary disease) (HCC)    GERD (gastroesophageal reflux disease)    Hypercholesterolemia    Pre-diabetes    Reactive airway disease    Tachycardia     PSH: Past Surgical History:  Procedure Laterality Date   CATARACT EXTRACTION W/PHACO Left 01/17/2017   Procedure: CATARACT EXTRACTION PHACO AND INTRAOCULAR LENS PLACEMENT ([place]);  Surgeon: Tonny Branch, MD;  Location: [facility];  Service: Ophthalmology;  Laterality: Left;  CDE: 6.92   CATARACT EXTRACTION W/PHACO Right 03/07/2020   Procedure: CATARACT EXTRACTION PHACO AND INTRAOCULAR LENS PLACEMENT RIGHT EYE;  Surgeon: Baruch Goldmann, MD;  Location: [facility];  Service: Ophthalmology;  Laterality: Right;  CDE: 8.44   COLONOSCOPY  2013   RMR: Normal rectum. single diminutive sigmoid polyp noted above. Normal terminal ileum. Status post segmental biopsy. next TCS in 10 years.    COLONOSCOPY WITH PROPOFOL N/A 01/27/2022   Procedure: COLONOSCOPY WITH PROPOFOL;  Surgeon: Daneil Dolin, MD;  Location: [facility];  Service: Endoscopy;  Laterality: N/A;  12:45pm   ELBOW SURGERY Right 2023   ESOPHAGOGASTRODUODENOSCOPY  2013   RMR: Possible cervical esophageal web-status post dilation as described above. Small hiatal hernia. Status post biopsy of normal appearing small bowel to screen for celiac disease.    ESOPHAGOGASTRODUODENOSCOPY N/A 05/06/2016   normal esophagus, small hiatal hernia, empiric dilation.   ESOPHAGOGASTRODUODENOSCOPY (EGD) WITH PROPOFOL N/A 01/27/2022   Procedure: ESOPHAGOGASTRODUODENOSCOPY (EGD) WITH PROPOFOL;  Surgeon: Daneil Dolin, MD;  Location: [facility];  Service: Endoscopy;  Laterality: N/A;   EYE SURGERY Bilateral    Cat Sx - Symfony MF IOLs   MALONEY DILATION N/A 05/06/2016   Procedure: MALONEY DILATION;  Surgeon: Daneil Dolin, MD;  Location: [facility];  Service: Endoscopy;  Laterality: N/A;   MALONEY DILATION N/A 01/27/2022   Procedure: Venia Minks DILATION;  Surgeon: Daneil Dolin,  MD;  Location: [facility];  Service: Endoscopy;  Laterality: N/A;   NECK SURGERY  10/2012   OVARIAN CYST SURGERY  1997   removed   POLYPECTOMY  01/27/2022   Procedure: POLYPECTOMY;  Surgeon: Daneil Dolin, MD;  Location: [facility];  Service: Endoscopy;;  cecal   TUBAL LIGATION  7858   YAG LASER APPLICATION Bilateral     SH: Social History   Tobacco Use   Smoking status: Every Day    Packs/day: 1.00    Years: 25.00    Total pack years: 25.00    Types: Cigarettes   Smokeless tobacco: Never  Vaping Use   Vaping Use: Never used  Substance Use Topics   Alcohol use: No    Alcohol/week: 0.0 standard drinks of alcohol   Drug use: No    ROS: All other review of systems were reviewed and are negative except what is noted above in HPI  PE:  BP 122/74   Pulse 72  GENERAL APPEARANCE:  Well appearing, well developed, well nourished, NAD HEENT:  Atraumatic, normocephalic NECK:  Supple. Trachea midline ABDOMEN:  Soft, non-tender, no masses EXTREMITIES:  Moves all extremities well, without clubbing, cyanosis, or edema NEUROLOGIC:  Alert and oriented x 3, normal gait, CN II-XII grossly intact MENTAL STATUS:  appropriate BACK:  Non-tender to palpation, No CVAT SKIN:  Warm, dry, and intact   Results: Laboratory Data: Lab Results  Component Value Date   WBC 7.9 12/03/2021   HGB 13.0 12/03/2021   HCT 39.1 12/03/2021   MCV 90 12/03/2021   PLT 277 12/03/2021    Lab Results  Component Value Date   CREATININE 0.50 (L) 04/15/2022    No results found for: "PSA"  No results found for: "TESTOSTERONE"  No results found for: "HGBA1C"  Urinalysis    Component Value Date/Time   COLORURINE YELLOW 08/26/2017 1550   APPEARANCEUR Clear 05/03/2022 1610   LABSPEC 1.013 08/26/2017 1550   PHURINE 6.0 08/26/2017 1550   GLUCOSEU Negative 05/03/2022 1610   HGBUR NEGATIVE 08/26/2017 1550   BILIRUBINUR Negative 05/03/2022 1610   KETONESUR negative 04/27/2022 0913   KETONESUR  NEGATIVE 08/26/2017 1550   PROTEINUR Negative 05/03/2022 1610   PROTEINUR NEGATIVE 08/26/2017 1550   UROBILINOGEN 0.2 04/27/2022 0913   NITRITE Negative 05/03/2022 1610   NITRITE NEGATIVE 08/26/2017 1550   LEUKOCYTESUR Negative 05/03/2022 1610    Lab Results  Component Value Date   LABMICR See below: 05/03/2022   WBCUA 0-5 05/03/2022   LABEPIT None seen 05/03/2022   MUCUS Present 03/03/2022   BACTERIA None seen 05/03/2022    Pertinent Imaging: Results for orders placed during the hospital encounter of 04/27/22  DG Abd 1 View  Narrative CLINICAL DATA:  left flank pain, hematuria, dysuria, hx of kidney stones  EXAM: ABDOMEN - 1 VIEW  COMPARISON:  11/17/2021  FINDINGS: The bowel gas pattern is normal. Hepatomegaly. No radio-opaque calculi or other significant radiographic abnormality are seen. Moderate-large volume of stool throughout the colon.  IMPRESSION: 1. No radiopaque calculi identified. 2. Hepatomegaly. 3. Moderate-large volume of stool throughout the colon.   Electronically Signed By: Davina Poke D.O. On: 04/27/2022 09:35  No results found for this or any previous visit.  No results found for this or any previous visit.  No results found for this or any previous visit.  Results for orders placed in visit on 03/28/17  US Renal  Narrative CLINICAL DATA:  2-3 weeks of left sided renal colic symptoms. Known bilateral kidney stones.  EXAM: RENAL / URINARY TRACT ULTRASOUND COMPLETE  COMPARISON:  [facility] of March 25, 2017  FINDINGS: Right Kidney:  Length: 10.8 cm. The renal cortical echotexture remains lower than that of the liver. There is no hydronephrosis. No discrete stones are observed.  Left Kidney:  Length: 12.2 cm. The renal cortical echotexture is similar to that on the right. There is no hydronephrosis. No stones are evident.  Bladder:  Appears normal for degree of bladder distention.  IMPRESSION: Normal renal ultrasound  examination.  No hydronephrosis.   Electronically Signed By: David  Martinique M.D. On: 03/29/2017 14:53  No valid procedures specified. No results found for this or any previous visit.  Results for orders placed in visit on 11/17/21  CT RENAL STONE STUDY  Narrative CLINICAL DATA:  Left-sided flank pain with dysuria.  EXAM: CT ABDOMEN AND PELVIS WITHOUT CONTRAST  TECHNIQUE: Multidetector CT imaging of the abdomen and pelvis was performed following the  standard protocol without IV contrast.  RADIATION DOSE REDUCTION: This exam was performed according to the departmental dose-optimization program which includes automated exposure control, adjustment of the mA and/or kV according to patient size and/or use of iterative reconstruction technique.  COMPARISON:  10/09/2015  FINDINGS: Lower chest: Unremarkable.  Hepatobiliary: Stable 7 mm hypodensity medial segment left liver consistent with benign etiology. No followup recommended. There is no evidence for gallstones, gallbladder wall thickening, or pericholecystic fluid. No intrahepatic or extrahepatic biliary dilation.  Pancreas: No focal mass lesion. No dilatation of the main duct. No intraparenchymal cyst. No peripancreatic edema.  Spleen: No splenomegaly. No focal mass lesion.  Adrenals/Urinary Tract: No adrenal nodule or mass. Kidneys unremarkable. No evidence for hydroureter. The urinary bladder appears normal for the degree of distention.  Stomach/Bowel: Stomach is unremarkable. No gastric wall thickening. No evidence of outlet obstruction. Duodenum is normally positioned as is the ligament of Treitz. No small bowel wall thickening. No small bowel dilatation. The terminal ileum is normal. The appendix is normal. No gross colonic mass. No colonic wall thickening.  Vascular/Lymphatic: There is mild atherosclerotic calcification of the abdominal aorta without aneurysm. There is no gastrohepatic or hepatoduodenal  ligament lymphadenopathy. No retroperitoneal or mesenteric lymphadenopathy. No pelvic sidewall lymphadenopathy.  Reproductive: Unremarkable.  Other: No intraperitoneal free fluid.  Musculoskeletal: No worrisome lytic or sclerotic osseous abnormality.  IMPRESSION: No acute findings in the abdomen or pelvis. Specifically, no findings to explain the patient's history of left flank pain.  Aortic Atherosclerosis (ICD10-I70.0).   Electronically Signed By: Misty Stanley M.D. On: 11/17/2021 15:55  No results found for this or any previous visit (from the past 24 hour(s)).

## 2022-05-03 NOTE — Patient Instructions (Signed)
No activities until cx results are discussed  Start Estrace twice weekly after cx results discussed  Keflex twice daily until culture results discussed

## 2022-05-04 ENCOUNTER — Telehealth: Payer: Self-pay | Admitting: "Endocrinology

## 2022-05-04 LAB — URINALYSIS, ROUTINE W REFLEX MICROSCOPIC
Bilirubin, UA: NEGATIVE
Glucose, UA: NEGATIVE
Ketones, UA: NEGATIVE
Leukocytes,UA: NEGATIVE
Nitrite, UA: NEGATIVE
Protein,UA: NEGATIVE
Specific Gravity, UA: 1.005 (ref 1.005–1.030)
Urobilinogen, Ur: 0.2 mg/dL (ref 0.2–1.0)
pH, UA: 6.5 (ref 5.0–7.5)

## 2022-05-04 LAB — MICROSCOPIC EXAMINATION
Bacteria, UA: NONE SEEN
Epithelial Cells (non renal): NONE SEEN /hpf (ref 0–10)

## 2022-05-04 NOTE — Telephone Encounter (Signed)
New message    The patient has question regarding Prolia injection asking for a call back

## 2022-05-04 NOTE — Telephone Encounter (Signed)
Pt is still waiting on prolia from [facility]. We need to reschedule her.

## 2022-05-05 ENCOUNTER — Telehealth: Payer: Self-pay

## 2022-05-05 ENCOUNTER — Ambulatory Visit: Payer: BC Managed Care – PPO | Admitting: "Endocrinology

## 2022-05-05 LAB — URINE CULTURE

## 2022-05-05 NOTE — Telephone Encounter (Signed)
Made patient aware that her urine culture was negative and patient voiced understanding.

## 2022-05-05 NOTE — Telephone Encounter (Signed)
-----   Message from Reynaldo Minium, [place] sent at 05/05/2022  8:38 AM EDT ----- Please let pt know her urine cx is negative.  ----- Message ----- From: Interface, [facility] Lab Results In Sent: 05/04/2022   3:36 PM EDT To: Reynaldo Minium, PA-C

## 2022-05-11 ENCOUNTER — Ambulatory Visit (HOSPITAL_COMMUNITY)
Admission: RE | Admit: 2022-05-11 | Discharge: 2022-05-11 | Disposition: A | Discharge status: Home / Self Care | Payer: BC Managed Care – PPO | Source: Ambulatory Visit | Attending: Nurse Practitioner | Admitting: Nurse Practitioner

## 2022-05-11 ENCOUNTER — Encounter: Payer: Self-pay | Admitting: Nurse Practitioner

## 2022-05-11 ENCOUNTER — Ambulatory Visit: Payer: BC Managed Care – PPO | Admitting: Nurse Practitioner

## 2022-05-11 VITALS — BP 128/82 | Ht 63.0 in | Wt 99.6 lb

## 2022-05-11 DIAGNOSIS — R1032 Left lower quadrant pain: Secondary | ICD-10-CM | POA: Diagnosis present

## 2022-05-11 DIAGNOSIS — Z124 Encounter for screening for malignant neoplasm of cervix: Secondary | ICD-10-CM

## 2022-05-11 DIAGNOSIS — N8110 Cystocele, unspecified: Secondary | ICD-10-CM | POA: Insufficient documentation

## 2022-05-11 DIAGNOSIS — N952 Postmenopausal atrophic vaginitis: Secondary | ICD-10-CM

## 2022-05-11 LAB — POCT URINALYSIS DIPSTICK
Spec Grav, UA: 1.02 (ref 1.010–1.025)
pH, UA: 7 (ref 5.0–8.0)

## 2022-05-11 NOTE — Progress Notes (Signed)
Subjective:    Patient ID: Isabel Atkinson, female    DOB: [birth year], 56 y.o.   MRN: [id number]  HPI  56 year old female patient presents to the clinic with continued concerns of pain to her lower abdomen and left groin pain and hematuria x2 weeks.  Patient was treated for UTIs since April and has also seen urology for cystoscopy.  Cystoscopy benign.  However patient continues to have pain.  Patient also admitted to dysuria, urinary frequency, urinary urgency.  Patient denies any vaginal bleeding, fevers, back pain, body aches, chills.   Review of Systems  Genitourinary:  Positive for dysuria, frequency, hematuria, pelvic pain and urgency.       Objective:   Physical Exam Vitals reviewed. Exam conducted with a chaperone present.  Constitutional:      General: She is not in acute distress.    Appearance: Normal appearance. She is normal weight. She is not ill-appearing, toxic-appearing or diaphoretic.  HENT:     Head: Normocephalic and atraumatic.  Cardiovascular:     Rate and Rhythm: Normal rate and regular rhythm.     Pulses: Normal pulses.     Heart sounds: Normal heart sounds. No murmur heard. Pulmonary:     Effort: Pulmonary effort is normal. No respiratory distress.     Breath sounds: Normal breath sounds. No wheezing.  Abdominal:     Hernia: There is no hernia in the left inguinal area or right inguinal area.  Genitourinary:    Exam position: Lithotomy position.     Pubic Area: No rash or pubic lice.      Labia:        Right: No rash, tenderness, lesion or injury.        Left: No rash, tenderness, lesion or injury.      Urethra: Urethral pain present. No prolapse, urethral swelling or urethral lesion.     Vagina: No signs of injury and foreign body. Tenderness and prolapsed vaginal walls present. No vaginal discharge, erythema, bleeding or lesions.     Cervix: Normal. No eversion.     Uterus: Normal. Not deviated, not enlarged, not fixed, not tender and no uterine  prolapse.      Rectum: Normal.     Comments: Urethra tender to palpation.  Very mild erythemic.  Vaginal prolapse noted on exam.  Vaginal canal atrophic and tender.  Anteriorly tilted cervix  Retroverted versus enlarged uterus noted on exam.  Adnexa not appreciated on exam  Rectum grossly intact Musculoskeletal:     Comments: Grossly intact  Lymphadenopathy:     Lower Body: No right inguinal adenopathy. No left inguinal adenopathy.  Skin:    General: Skin is warm.     Capillary Refill: Capillary refill takes less than 2 seconds.  Neurological:     Mental Status: She is alert.     Comments: Grossly intact  Psychiatric:        Mood and Affect: Mood normal.        Behavior: Behavior normal.           Assessment & Plan:   1. LLQ pain - Suspect GYN etiology - Will get Vaginal U/S and refer to GYN for evaluation - Will also r/o UTI with urine cx - POCT urinalysis dipstick = negative - IGP, rfx Aptima HPV ASCU - Ambulatory referral to Gynecology - US Pelvic Complete With Transvaginal - Urine Culture  2. Vaginal prolapse - Likely vaginal prolapse noted on exam - Unsure if contributing to pain, however will  have patient evaluated by GYN - Ambulatory referral to Gynecology - US Pelvic Complete With Transvaginal  3. Vaginal atrophy - Use estrogen cream as prescribed. - Ambulatory referral to Gynecology  Return to clinic if pain worsens or does not improve.

## 2022-05-13 ENCOUNTER — Telehealth: Payer: Self-pay | Admitting: Nurse Practitioner

## 2022-05-13 ENCOUNTER — Encounter: Payer: Self-pay | Admitting: Nurse Practitioner

## 2022-05-13 ENCOUNTER — Other Ambulatory Visit: Payer: Self-pay | Admitting: Family Medicine

## 2022-05-13 LAB — IGP, RFX APTIMA HPV ASCU

## 2022-05-13 LAB — SPECIMEN STATUS REPORT

## 2022-05-13 MED ORDER — OXYCODONE-ACETAMINOPHEN 5-325 MG PO TABS: ORAL_TABLET | ORAL | 0 refills | Status: DC

## 2022-05-13 NOTE — Telephone Encounter (Signed)
Prescription for oxycodone 5 was sent in 1 every 6 hours as needed for pain caution drowsiness no driving with medicine not intended for long-term use if ongoing troubles or discomfort would recommend follow-up

## 2022-05-13 NOTE — Telephone Encounter (Signed)
Pt requesting something stronger for pain. Please advise. Thank you

## 2022-05-13 NOTE — Telephone Encounter (Signed)
Patient requesting a work note for the rest of week tried to work yesterday but couldn't due to pain. She wanted to extended through Friday was seen on 05/12/22. She also requesting something stronger for pain. [employer]

## 2022-05-14 ENCOUNTER — Other Ambulatory Visit: Payer: Self-pay | Admitting: Nurse Practitioner

## 2022-05-14 ENCOUNTER — Other Ambulatory Visit: Payer: Self-pay | Admitting: Family Medicine

## 2022-05-14 ENCOUNTER — Encounter: Payer: Self-pay | Admitting: Nurse Practitioner

## 2022-05-14 ENCOUNTER — Telehealth: Payer: Self-pay

## 2022-05-14 ENCOUNTER — Telehealth: Payer: Self-pay | Admitting: Family Medicine

## 2022-05-14 DIAGNOSIS — R300 Dysuria: Secondary | ICD-10-CM

## 2022-05-14 LAB — URINE CULTURE

## 2022-05-14 LAB — SPECIMEN STATUS REPORT

## 2022-05-14 MED ORDER — CEPHALEXIN 500 MG PO CAPS: 500.0000 mg | ORAL_CAPSULE | Freq: Three times a day (TID) | ORAL | 0 refills | Status: DC

## 2022-05-14 NOTE — Telephone Encounter (Signed)
Spoke with patient and informed per drs notes.

## 2022-05-14 NOTE — Telephone Encounter (Signed)
Report reviewed and antibiotic sent in.

## 2022-05-14 NOTE — Telephone Encounter (Signed)
Informed patient of her gyn referral status.

## 2022-05-14 NOTE — Telephone Encounter (Signed)
Patient stated she saw that her urine culture came through in [place] and would like to know if she can have a prescription for antibiotics sent to [facility].

## 2022-05-14 NOTE — Telephone Encounter (Signed)
Nurses As for the pain medicine advised the patient started off trying just half tablet as needed for pain every 4-6 hours if that does not do enough she can increase that to a full tablet every 4-6 hours but this medication is not meant for long-term use and can increase risk of drowsiness  It would also be wise to avoid alprazolam use with this but it does not appear that she utilizes alprazolam on a frequent basis at all

## 2022-05-14 NOTE — Telephone Encounter (Signed)
Patient has been made aware per drs recommendations.  

## 2022-05-18 ENCOUNTER — Ambulatory Visit (INDEPENDENT_AMBULATORY_CARE_PROVIDER_SITE_OTHER): Payer: BC Managed Care – PPO | Admitting: Family Medicine

## 2022-05-18 ENCOUNTER — Encounter: Payer: Self-pay | Admitting: Family Medicine

## 2022-05-18 VITALS — BP 130/81 | HR 96 | Temp 96.8°F | Wt 98.2 lb

## 2022-05-18 DIAGNOSIS — J111 Influenza due to unidentified influenza virus with other respiratory manifestations: Secondary | ICD-10-CM | POA: Diagnosis not present

## 2022-05-18 DIAGNOSIS — R1032 Left lower quadrant pain: Secondary | ICD-10-CM

## 2022-05-18 DIAGNOSIS — R102 Pelvic and perineal pain: Secondary | ICD-10-CM

## 2022-05-18 DIAGNOSIS — R1012 Left upper quadrant pain: Secondary | ICD-10-CM

## 2022-05-18 DIAGNOSIS — R509 Fever, unspecified: Secondary | ICD-10-CM | POA: Diagnosis not present

## 2022-05-18 DIAGNOSIS — R1030 Lower abdominal pain, unspecified: Secondary | ICD-10-CM

## 2022-05-18 DIAGNOSIS — B349 Viral infection, unspecified: Secondary | ICD-10-CM

## 2022-05-18 MED ORDER — OXYCODONE HCL 5 MG PO TABS: ORAL_TABLET | ORAL | 0 refills | Status: DC

## 2022-05-18 NOTE — Progress Notes (Addendum)
   Subjective:    Patient ID: Isabel Atkinson, female    DOB: 05/12/1966, 56 y.o.   MRN: [id number]  HPI Pt arrives with cough, bad headache, runny nose and some wheezing. Symptoms began on Friday. COVID test x3 have been negative; most recent test on Sunday. Pt is currently on Keflex for UTI. Pt has also tried Mucinex. Pt would like to be tested for Flu.  See difficult left-sided abdominal pain as well as pelvic region ultrasound was really not revealing of a specific issue she has an appointment with GI in a.m. an appointment with them gynecology coming up Denies any rectal bleeding denies vaginal bleeding Review of Systems     Objective:   Physical Exam Gen-NAD not toxic TMS-normal bilateral T- normal no redness Chest-CTA respiratory rate normal no crackles CV RRR no murmur Skin-warm dry Neuro-grossly normal  significant discomfort left side of the abdomen and pelvic region    Assessment & Plan:  Patient looks to feel ill but not toxic.  Do not hear any pneumonia. We will go ahead with triple swab testing Her COVID test at home was negative This could be COVID or flu She is already on antibiotic for UTI continue this antibiotic No need for prednisone currently Does have a history of kidney stones Also has lower pelvic and abdominal pain Given the left-sided nature of her pain from the left upper quadrant left mid quadrant to left lower quadrant cannot exclude the possibility of diverticulitis or the possibility of colitis going on I do not feel that the ultrasound of the pelvis was enough I feel that we need to press forward with a CAT scan plus also she will be seeing gastroenterology later in the month Will be touching base with specialist that are seeing her coming up Due to the amount of pain she is having she is unable to work we have given her a work note from October 3 through November 3 if she needs a FMLA filled out she will let us know

## 2022-05-19 ENCOUNTER — Other Ambulatory Visit: Payer: Self-pay

## 2022-05-19 DIAGNOSIS — R109 Unspecified abdominal pain: Secondary | ICD-10-CM

## 2022-05-19 DIAGNOSIS — R1032 Left lower quadrant pain: Secondary | ICD-10-CM

## 2022-05-19 NOTE — Progress Notes (Signed)
Pt aware of order and plan

## 2022-05-20 ENCOUNTER — Encounter: Payer: Self-pay | Admitting: Family Medicine

## 2022-05-20 LAB — COVID-19, FLU A+B AND RSV
Influenza A, NAA: NOT DETECTED
Influenza B, NAA: NOT DETECTED
RSV, NAA: NOT DETECTED
SARS-CoV-2, NAA: NOT DETECTED

## 2022-05-20 NOTE — Telephone Encounter (Signed)
Severe coughing can go along with COPD with exacerbation.  The other day when I listen to her lungs I did not hear any pneumonia but we are more than willing to recheck her this afternoon if she would like to be seen.

## 2022-05-21 ENCOUNTER — Ambulatory Visit (INDEPENDENT_AMBULATORY_CARE_PROVIDER_SITE_OTHER): Payer: BC Managed Care – PPO | Admitting: Family Medicine

## 2022-05-21 ENCOUNTER — Encounter: Payer: Self-pay | Admitting: Family Medicine

## 2022-05-21 DIAGNOSIS — U071 COVID-19: Secondary | ICD-10-CM

## 2022-05-21 DIAGNOSIS — R059 Cough, unspecified: Secondary | ICD-10-CM | POA: Diagnosis not present

## 2022-05-21 MED ORDER — CLINDAMYCIN HCL 300 MG PO CAPS: 300.0000 mg | ORAL_CAPSULE | Freq: Three times a day (TID) | ORAL | 0 refills | Status: DC

## 2022-05-21 MED ORDER — PREDNISONE 20 MG PO TABS: 20.0000 mg | ORAL_TABLET | Freq: Every day | ORAL | 0 refills | Status: DC

## 2022-05-21 MED ORDER — ALBUTEROL SULFATE HFA 108 (90 BASE) MCG/ACT IN AERS: 2.0000 | INHALATION_SPRAY | RESPIRATORY_TRACT | 0 refills | Status: DC | PRN

## 2022-05-21 NOTE — Progress Notes (Unsigned)
   Subjective:    Patient ID: Isabel Atkinson, female    DOB: [birth year], 56 y.o.   MRN: [id number]  HPI Significant head congestion coughing recently treated for illness has ongoing troubles.  Feels short of breath at times.  We discussed quitting smoking using patches nicotine gum We also discussed risk of progression to severe COPD as she gets older if she continues to smoke She denies high fever chills sweats wheezing difficulty breathing   Review of Systems     Objective:   Physical Exam Lungs some scattered wheezing not severe heart regular HEENT moderate sinus tenderness       Assessment & Plan:   Secondary bronchitis Sinusitis Patient does have medicine intolerance as well as allergies Switch to clindamycin for 1 week Warned about C. difficile Follow-up if ongoing troubles  Albuterol as needed if it causes too much tachycardia stop that Also prednisone for 5 days

## 2022-05-21 NOTE — Progress Notes (Unsigned)
   Subjective:    Patient ID: Isabel Atkinson, female    DOB: 10-31-1965, 56 y.o.   MRN: [id number]  HPI Pt arrives due to ongoing cough and congestion. Pt states the congestion did clear up but has returned.    Review of Systems     Objective:   Physical Exam        Assessment & Plan:

## 2022-05-28 ENCOUNTER — Ambulatory Visit (HOSPITAL_COMMUNITY)
Admission: RE | Admit: 2022-05-28 | Discharge: 2022-05-28 | Disposition: A | Discharge status: Home / Self Care | Payer: BC Managed Care – PPO | Source: Ambulatory Visit | Attending: Family Medicine | Admitting: Family Medicine

## 2022-05-28 DIAGNOSIS — R1032 Left lower quadrant pain: Secondary | ICD-10-CM | POA: Diagnosis present

## 2022-05-28 DIAGNOSIS — R109 Unspecified abdominal pain: Secondary | ICD-10-CM | POA: Diagnosis present

## 2022-05-28 LAB — POCT I-STAT CREATININE: Creatinine, Ser: 0.6 mg/dL (ref 0.44–1.00)

## 2022-05-28 MED ORDER — IOHEXOL 9 MG/ML PO SOLN
ORAL | Status: AC
  Filled 2022-05-28: qty 500

## 2022-05-28 MED ORDER — IOHEXOL 300 MG/ML  SOLN
100.0000 mL | Freq: Once | INTRAMUSCULAR | Status: AC | PRN
  Administered 2022-05-28: 60 mL via INTRAVENOUS

## 2022-05-31 ENCOUNTER — Ambulatory Visit: Payer: BC Managed Care – PPO | Admitting: "Endocrinology

## 2022-05-31 ENCOUNTER — Telehealth: Payer: Self-pay | Admitting: "Endocrinology

## 2022-05-31 ENCOUNTER — Encounter: Payer: Self-pay | Admitting: "Endocrinology

## 2022-05-31 VITALS — BP 130/82 | HR 76 | Ht 63.0 in | Wt 100.8 lb

## 2022-05-31 DIAGNOSIS — M818 Other osteoporosis without current pathological fracture: Secondary | ICD-10-CM

## 2022-05-31 NOTE — Telephone Encounter (Signed)
Pt was seen today, she needs DEXA before next visit

## 2022-05-31 NOTE — Progress Notes (Signed)
05/31/2022        Endocrinology follow-up note  Past Medical History:  Diagnosis Date   Anxiety    COPD (chronic obstructive pulmonary disease) (HCC)    GERD (gastroesophageal reflux disease)    Hypercholesterolemia    Pre-diabetes    Reactive airway disease    Tachycardia    Past Surgical History:  Procedure Laterality Date   CATARACT EXTRACTION W/PHACO Left 01/17/2017   Procedure: CATARACT EXTRACTION PHACO AND INTRAOCULAR LENS PLACEMENT (Seltzer);  Surgeon: Tonny Branch, MD;  Location: [facility];  Service: Ophthalmology;  Laterality: Left;  CDE: 6.92   CATARACT EXTRACTION W/PHACO Right 03/07/2020   Procedure: CATARACT EXTRACTION PHACO AND INTRAOCULAR LENS PLACEMENT RIGHT EYE;  Surgeon: Baruch Goldmann, MD;  Location: [facility];  Service: Ophthalmology;  Laterality: Right;  CDE: 8.44   COLONOSCOPY  2013   RMR: Normal rectum. single diminutive sigmoid polyp noted above. Normal terminal ileum. Status post segmental biopsy. next TCS in 10 years.    COLONOSCOPY WITH PROPOFOL N/A 01/27/2022   Procedure: COLONOSCOPY WITH PROPOFOL;  Surgeon: Daneil Dolin, MD;  Location: [facility];  Service: Endoscopy;  Laterality: N/A;  12:45pm   ELBOW SURGERY Right 2023   ESOPHAGOGASTRODUODENOSCOPY  2013   RMR: Possible cervical esophageal web-status post dilation as described above. Small hiatal hernia. Status post biopsy of normal appearing small bowel to screen for celiac disease.    ESOPHAGOGASTRODUODENOSCOPY N/A 05/06/2016   normal esophagus, small hiatal hernia, empiric dilation.   ESOPHAGOGASTRODUODENOSCOPY (EGD) WITH PROPOFOL N/A 01/27/2022   Procedure: ESOPHAGOGASTRODUODENOSCOPY (EGD) WITH PROPOFOL;  Surgeon: Daneil Dolin, MD;  Location: [facility];  Service: Endoscopy;  Laterality: N/A;   EYE SURGERY Bilateral    Cat Sx - Symfony MF IOLs   MALONEY DILATION N/A 05/06/2016   Procedure: MALONEY DILATION;  Surgeon: Daneil Dolin, MD;   Location: [facility];  Service: Endoscopy;  Laterality: N/A;   MALONEY DILATION N/A 01/27/2022   Procedure: Venia Minks DILATION;  Surgeon: Daneil Dolin, MD;  Location: [facility];  Service: Endoscopy;  Laterality: N/A;   NECK SURGERY  10/2012   OVARIAN CYST SURGERY  1997   removed   POLYPECTOMY  01/27/2022   Procedure: POLYPECTOMY;  Surgeon: Daneil Dolin, MD;  Location: [facility];  Service: Endoscopy;;  cecal   TUBAL LIGATION  4765   YAG LASER APPLICATION Bilateral    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    Employer: [employer]  Tobacco Use   Smoking status: Every Day    Packs/day: 1.00    Years: 25.00    Total pack years: 25.00    Types: Cigarettes   Smokeless tobacco: Never  Vaping Use   Vaping Use: Never used  Substance and Sexual Activity   Alcohol use: No    Alcohol/week: 0.0 standard drinks of alcohol   Drug use: No   Sexual activity: Yes  Birth control/protection: Post-menopausal, Surgical  Other Topics Concern   Not on file  Social History Narrative   Lives at home w/ her husband   2 children - ages 42 & 17   Right-sided   Caffeine: 2 beverages per day   Social Determinants of Health   Financial Resource Strain: Not on file  Food Insecurity: Not on file  Transportation Needs: Not on file  Physical Activity: Not on file  Stress: Not on file  Social Connections: Not on file   Outpatient Encounter Medications as of 05/31/2022  Medication Sig   acetaminophen (TYLENOL) 500 MG tablet Take 500 mg by mouth every 6 (six) hours as needed for moderate pain or headache.   albuterol (VENTOLIN HFA) 108 (90 Base) MCG/ACT inhaler Inhale 2 puffs into the lungs every 4 (four) hours as needed for wheezing or shortness of breath.   ALPRAZolam (XANAX) 0.25  MG tablet TAKE 1/2 TO 1 TABLET BY MOUTH TWICE DAILY AS NEEDED FOR ANXIETY. (Patient taking differently: Take 0.625 mg by mouth daily as needed for anxiety.)   aspirin EC 81 MG tablet Take 81 mg by mouth daily. Swallow whole.   CALCIUM PO Take 750 mg by mouth 2 (two) times daily with a meal.   cephALEXin (KEFLEX) 500 MG capsule Take 1 capsule (500 mg total) by mouth 3 (three) times daily.   cholecalciferol (VITAMIN D3) 25 MCG (1000 UNIT) tablet Take 1,000 Units by mouth in the morning and at bedtime.   clindamycin (CLEOCIN) 300 MG capsule Take 1 capsule (300 mg total) by mouth 3 (three) times daily.   cycloSPORINE (RESTASIS) 0.05 % ophthalmic emulsion Place 1 drop into both eyes 2 (two) times daily.   denosumab (PROLIA) 60 MG/ML SOSY injection Inject 60 mg into the skin every 6 (six) months.   EPINEPHRINE 0.3 mg/0.3 mL IJ SOAJ injection INJECT INTO OUTER THIGH AND HOLD AGAINST LEG FOR 10 SECONDS FOR SEVERE ALLERGIC REACTION.   estradiol (ESTRACE) 0.1 MG/GM vaginal cream Apply a pea size amount of cream to urethral area of vagina twice weekly   ibuprofen (ADVIL) 600 MG tablet Take 1 tablet (600 mg total) by mouth every 6 (six) hours as needed.   Misc Natural Products (ELDERBERRY IMMUNE COMPLEX PO) Take 1 capsule by mouth daily.   Multiple Vitamins-Minerals (MULTIVITAMIN WITH MINERALS) tablet Take 1 tablet by mouth daily.    omeprazole (PRILOSEC) 40 MG capsule Take 1 capsule (40 mg total) by mouth daily.   oxyCODONE (ROXICODONE) 5 MG immediate release tablet 1/2 to 1 every 4 hours as needed pain caution drowsiness   phenazopyridine (PYRIDIUM) 200 MG tablet Take 1 tablet (200 mg total) by mouth 3 (three) times daily as needed for pain.   polyethylene glycol-electrolytes (NULYTELY) 420 g solution As directed   predniSONE (DELTASONE) 20 MG tablet Take 1 tablet (20 mg total) by mouth daily with breakfast.   PROLENSA 0.07 % SOLN INSTILL 1 DROP INTO THE RIGHT EYE TWICE DAILY.   rosuvastatin (CRESTOR) 10 MG  tablet Take one tablet (10 mg) by mouth on Monday, Wednesday, and Friday.   silver sulfADIAZINE (SILVADENE) 1 % cream Apply BID prn (Patient taking differently: Apply 1 application  topically 2 (two) times daily as needed (burns).)   traMADol (ULTRAM) 50 MG tablet Take 1 tablet (50 mg total) by mouth every 12 (twelve) hours as needed.   Triamcinolone Acetonide (NASACORT ALLERGY 24HR NA) Place 2 sprays into the nose daily as needed (allergies).   Vaginal Lubricant (REPLENS) GEL Place  1 Application vaginally every 3 (three) days.   No facility-administered encounter medications on file as of 05/31/2022.   ALLERGIES: Allergies  Allergen Reactions   Ciprofloxacin Other (See Comments)    Patient states with in five minutes of taking developed a splitting headache,itching all over and burning in mouth and lips.   Augmentin [Amoxicillin-Pot Clavulanate] Diarrhea   Carafate [Sucralfate]     Mouth tingling   Celexa [Citalopram Hydrobromide] Nausea And Vomiting   Chantix [Varenicline] Nausea And Vomiting   Sulfa Antibiotics Itching   Sulfur Itching   Wellbutrin [Bupropion]     Severe weight loss and loss of appetite   Fosamax [Alendronate Sodium] Nausea And Vomiting    GERD can not tolerate    VACCINATION STATUS: Immunization History  Administered Date(s) Administered   Influenza,inj,Quad PF,6+ Mos 09/14/2017, 07/04/2018, 04/25/2019, 07/14/2020   PFIZER(Purple Top)SARS-COV-2 Vaccination 10/14/2019, 11/04/2019   Pneumococcal Polysaccharide-23 04/25/2019   Tdap 01/19/2018    HPI   Isabel Atkinson is 56 y.o. female who presents today with a medical history as above. she is being seen in follow-up after she was seen in consultation for osteoporosis requested by Kathyrn Drown, MD. She is status post 3 treatments of Prolia subcutaneous injection and she is here for her next Prolia injection.  No new complaints today.  She has no interval falls nor fractures.    Patient was diagnosed  with osteoporosis  approximately  9 years ago. No dizziness/vertigo/orthostasis. She was treated with Fosamax which she did not tolerate.  She was also treated with what appears to be Reclast infusion also did not tolerate prior to 5 years ago. She has history of vitamin D deficiency currently on vitamin D and calcium supplements.  She also eats dairy and green, leafy, vegetables.   No weight bearing exercises.  She denies loss of height. She has prior exposure to on and off steroids due to COPD/reactive airway disease, however denies chronic exposure to heavy dose steroids. She denies any prior history of parathyroid, thyroid dysfunctions.  No history of nephrolithiasis.  Her most recent calcium level is 9.6.  No h/o CKD. Last BUN/Cr: 15/0.56  Menopause was at 56 y/o.   Pt does not have a FH of osteoporosis.  She has dental implants, however no scheduled dental procedure in the next near future.   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  She remains an active smoker.  She also has history of irritable bowel syndrome, prediabetes.  Review of Systems  Constitutional: + No recent major weight change, she has always been light build,  + fatigue, no subjective hyperthermia, no subjective hypothermia   Objective:    BP 130/82   Pulse 76   Ht '5\' 3"'$  (1.6 m)   Wt 100 lb 12.8 oz (45.7 kg)   BMI 17.86 kg/m   Wt Readings from Last 3 Encounters:  05/31/22 100 lb 12.8 oz (45.7 kg)  05/21/22 98 lb 3.2 oz (44.5 kg)  05/18/22 98 lb 3.2 oz (44.5 kg)    Physical Exam  Constitutional: + BMI of 18.9, not in acute distress, normal state of mind Eyes: PERRLA, EOMI, no exophthalmos ENT: moist mucous membranes, no thyromegaly, no cervical lymphadenopathy   CMP ( most recent) CMP     Component Value Date/Time   NA 143 04/15/2022 1350   K 3.9 04/15/2022 1350   CL 106 04/15/2022 1350   CO2 22 04/15/2022 1350  GLUCOSE 116 (H)  04/15/2022 1350   GLUCOSE 111 (H) 01/11/2017 1440   BUN 14 04/15/2022 1350   CREATININE 0.60 05/28/2022 0808   CREATININE 0.55 11/03/2013 0951   CALCIUM 9.0 04/15/2022 1350   PROT 5.8 (L) 04/15/2022 1350   PROT 7.0 12/09/2011 0900   ALBUMIN 4.3 04/15/2022 1350   AST 16 04/15/2022 1350   AST 18 12/09/2011 0900   ALT 11 04/15/2022 1350   ALKPHOS 57 04/15/2022 1350   ALKPHOS 76 12/09/2011 0900   BILITOT 0.2 04/15/2022 1350   BILITOT 0.3 12/09/2011 0900   GFRNONAA 105 01/11/2020 0811   GFRAA 121 01/11/2020 0811    Lipid Panel     Component Value Date/Time   CHOL 158 09/02/2021 0936   TRIG 96 09/02/2021 0936   HDL 59 09/02/2021 0936   CHOLHDL 2.7 09/02/2021 0936   CHOLHDL 2.5 11/03/2013 0951   VLDL 13 11/03/2013 0951   LDLCALC 81 09/02/2021 0936   LABVLDL 18 09/02/2021 0936      Lab Results  Component Value Date   TSH 1.310 10/23/2020   TSH 1.430 01/11/2020   TSH 1.110 12/01/2017   TSH 1.030 01/14/2016   TSH 1.030 09/10/2015   TSH 0.962 04/25/2015   TSH 0.924 11/03/2013   FREET4 1.32 10/23/2020   FREET4 1.45 01/14/2016     Bone density on October 08, 2020 [facility] L1-L4 10/08/2020 56.4 Osteoporosis -2.9 0.834 g/cm2 -9.9% [facility] L1-L4 06/26/2015 51.1 Osteopenia -2.1 0.926 g/cm2 4.2% - [facility] L1-L4 05/18/2013 49.0 Osteopenia -2.4 0.889 g/cm2 - -   DualFemur Neck Right 10/08/2020 56.4 Osteoporosis -2.7 0.659 g/cm2 -7.7%  DualFemur Neck Right 06/26/2015 51.1 Osteopenia -2.3 0.714 g/cm2 4.7% - DualFemur Neck Right 05/18/2013 49.0 Osteoporosis -2.6 0.682 g/cm2 -  DualFemur Total Mean 10/08/2020 56.4 Osteopenia -2.2 0.726 g/cm2 -9.5%  DualFemur Total Mean 06/26/2015 51.1 Osteopenia -1.6 0.802 g/cm2 3.9%  DualFemur Total Mean 05/18/2013 49.0 Osteopenia -1.9 0.772 g/cm2 -    ASSESSMENT: BMD as determined from [facility] L1-L4 is 0.834 g/cm2 with a T-Score of -2.9. This patient is considered osteoporotic according to [facility])  criteria.    Assessment: 1. Osteoporosis 2.  Bisphosphonates intolerance, GERD  Plan: 1. Osteoporosis -Likely multifactorial including postmenopausal, steroids, smoking.   -I discussed her available bone density studies, 3 studies between 2014 and 2022 with her, discussed about increased risk of fracture, depending on the T score. -He has tolerated her previous Prolia injections.  She will be given  dose of Prolia today and we will plan her next injection in 6 months.      We discussed about the different medication classes, benefits and side effects (including atypical fractures and ONJ - no dental workup in progress or planned).  -She has severe GERD, did not tolerate oral bisphosphonates.  She did not tolerate IV bisphosphonate either.    Her previsit CMP are favorable.      She has no evidence of primary hyperparathyroidism nor thyroid dysfunction.      -She is made aware of the fact that she cannot withdraw Prolia treatment without proper replacement/backup to avoid atypical fracture. - I explained the mechanism of action and expected benefits.   - we reviewed her dietary and supplemental calcium and vitamin D intake, which I advised her to increase her vitamin D to 2000 units daily, calcium 750 mg twice a day.  We also discussed food sources of this elements and vitamin.  - discussed fall precautions   -She  remains a chronic active smoker: The patient was counseled on the dangers of tobacco use, and was advised to quit.  Reviewed strategies to maximize success, including removing cigarettes and smoking materials from environment.   She was observed for 15 minutes after Prolia injection for reactions.  She was stable to discharge.  - I advised patient to maintain close follow up with Kathyrn Drown, MD for primary ae needs.  I spent 21 minutes in the care of the patient today including review of labs from Thyroid Function, CMP, and other relevant labs ; imaging/biopsy records  (current and previous including abstractions from other facilities); face-to-face time discussing  her lab results and symptoms, medications doses, her options of short and long term treatment based on the latest standards of care / guidelines;   and documenting the encounter.  Karena Addison  participated in the discussions, expressed understanding, and voiced agreement with the above plans.  All questions were answered to her satisfaction. she is encouraged to contact clinic should she have any questions or concerns prior to her return visit.   Follow up plan: Return in about 6 months (around 11/30/2022) for F/U with Pre-visit Labs, Prolia Today and Prolia NV, DXA Scan B4 NV.   Glade Lloyd, MD [facility] [address] [place], [place] [address] Phone: [phone]  Fax: [phone]     05/31/2022, 7:00 PM  This note was partially dictated with voice recognition software. Similar sounding words can be transcribed inadequately or may not  be corrected upon review.

## 2022-05-31 NOTE — Progress Notes (Signed)
Pt was seen today for an office visit, she did bring her prolia with her. Prolia '60mg'$ /85m given sq in upper L arm without difficulty. Lot # [id number] Expiration Date 10/06/2024. Pt waited 15 minutes in office waiting room, no reaction noted.

## 2022-06-01 ENCOUNTER — Encounter: Payer: Self-pay | Admitting: Gastroenterology

## 2022-06-01 ENCOUNTER — Ambulatory Visit (INDEPENDENT_AMBULATORY_CARE_PROVIDER_SITE_OTHER): Payer: BC Managed Care – PPO | Admitting: Gastroenterology

## 2022-06-01 VITALS — BP 114/76 | HR 90 | Temp 99.0°F | Ht 63.0 in | Wt 99.8 lb

## 2022-06-01 DIAGNOSIS — K219 Gastro-esophageal reflux disease without esophagitis: Secondary | ICD-10-CM

## 2022-06-01 DIAGNOSIS — R109 Unspecified abdominal pain: Secondary | ICD-10-CM | POA: Insufficient documentation

## 2022-06-01 DIAGNOSIS — K6289 Other specified diseases of anus and rectum: Secondary | ICD-10-CM | POA: Diagnosis not present

## 2022-06-01 MED ORDER — HYDROCORTISONE (PERIANAL) 2.5 % EX CREA: 1.0000 | TOPICAL_CREAM | Freq: Two times a day (BID) | CUTANEOUS | 1 refills | Status: DC

## 2022-06-01 NOTE — Patient Instructions (Signed)
Since you had tenderness during rectal exam, we will treat for possible hemorrhoids, fissure but I suspect your abdominal/pelvic pain is unrelated and could be due to possible pelvic congestion syndrome.  Apply hydrocortisone anorectally twice daily for 10 days.  Return to the office for ongoing symptoms.

## 2022-06-01 NOTE — Telephone Encounter (Signed)
Order sent to [facility] for scheduling

## 2022-06-01 NOTE — Progress Notes (Unsigned)
GI Office Note    Referring Provider: Kathyrn Drown, MD Primary Care Physician:  Kathyrn Drown, MD  Primary Gastroenterologist:  Chief Complaint   Chief Complaint  Patient presents with   Rectal Pain    May be gyn related has an appt with them but wanted to mention it since here for follow up from colonoscopy.    History of Present Illness   Isabel Atkinson is a 56 y.o. female presenting today        April, pick strawberries, rectum hurt with bening over. Uti too. 04/2022 uti, severe intermittent , dull all the time. Worse left lower abd and into rectum.  Not worse with BM Stool soft, twice per day. No melena, brbpr.  No dysphagia a lot better.  No heartburn.   Tuesday gyn appt oxycodone    01/2022 Normal esophagus. Dilated. - Small hiatal hernia. - Normal duodenal bulb and second portion of the duodenum. - No specimens collected.  - Two 5 to 7 mm polyps at the hepatic flexure and in the cecum, removed with a cold snare. Resected and retrieved.hepatic sessile serrate - The examination was otherwise normal. Rectal vault seen well on-face. Too small to retroflex. Rectal mucosa appeared normal.     Tcs five years.  IMPRESSION: 1. No acute abnormality. 2. Prominent periuterine and adnexal vascularity, left greater than right, as well as dilatation of the left ovarian vein. Findings can be seen with pelvic congestion syndrome in the appropriate clinical setting. 3. Moderate colonic stool burden.   Aortic Atherosclerosis (ICD10-I70.0).  Medications   Current Outpatient Medications  Medication Sig Dispense Refill   acetaminophen (TYLENOL) 500 MG tablet Take 500 mg by mouth every 6 (six) hours as needed for moderate pain or headache.     albuterol (VENTOLIN HFA) 108 (90 Base) MCG/ACT inhaler Inhale 2 puffs into the lungs every 4 (four) hours as needed for wheezing or shortness of breath. 8 g 0   ALPRAZolam (XANAX) 0.25 MG tablet TAKE 1/2 TO 1  TABLET BY MOUTH TWICE DAILY AS NEEDED FOR ANXIETY. (Patient taking differently: Take 0.625 mg by mouth daily as needed for anxiety.) 30 tablet 0   aspirin EC 81 MG tablet Take 81 mg by mouth daily. Swallow whole.     CALCIUM PO Take 750 mg by mouth 2 (two) times daily with a meal.     cholecalciferol (VITAMIN D3) 25 MCG (1000 UNIT) tablet Take 1,000 Units by mouth in the morning and at bedtime.     cycloSPORINE (RESTASIS) 0.05 % ophthalmic emulsion Place 1 drop into both eyes 2 (two) times daily.     denosumab (PROLIA) 60 MG/ML SOSY injection Inject 60 mg into the skin every 6 (six) months. 1 mL 1   EPINEPHRINE 0.3 mg/0.3 mL IJ SOAJ injection INJECT INTO OUTER THIGH AND HOLD AGAINST LEG FOR 10 SECONDS FOR SEVERE ALLERGIC REACTION. 2 each 3   estradiol (ESTRACE) 0.1 MG/GM vaginal cream Apply a pea size amount of cream to urethral area of vagina twice weekly 42.5 g 3   ibuprofen (ADVIL) 600 MG tablet Take 1 tablet (600 mg total) by mouth every 6 (six) hours as needed. 30 tablet 0   Misc Natural Products (ELDERBERRY IMMUNE COMPLEX PO) Take 1 capsule by mouth daily.     Multiple Vitamins-Minerals (MULTIVITAMIN WITH MINERALS) tablet Take 1 tablet by mouth daily.      omeprazole (PRILOSEC) 40 MG capsule Take 1 capsule (40 mg total) by mouth daily.  90 capsule 3   oxyCODONE (ROXICODONE) 5 MG immediate release tablet 1/2 to 1 every 4 hours as needed pain caution drowsiness 20 tablet 0   PROLENSA 0.07 % SOLN INSTILL 1 DROP INTO THE RIGHT EYE TWICE DAILY. 3 mL 5   rosuvastatin (CRESTOR) 10 MG tablet Take one tablet (10 mg) by mouth on Monday, Wednesday, and Friday. 30 tablet 3   silver sulfADIAZINE (SILVADENE) 1 % cream Apply BID prn (Patient taking differently: Apply 1 application  topically 2 (two) times daily as needed (burns).) 50 g 6   traMADol (ULTRAM) 50 MG tablet Take 1 tablet (50 mg total) by mouth every 12 (twelve) hours as needed. 10 tablet 0   Triamcinolone Acetonide (NASACORT ALLERGY 24HR NA)  Place 2 sprays into the nose daily as needed (allergies).     Vaginal Lubricant (REPLENS) GEL Place 1 Application vaginally every 3 (three) days.     No current facility-administered medications for this visit.    Allergies   Allergies as of 06/01/2022 - Review Complete 06/01/2022  Allergen Reaction Noted   Ciprofloxacin Other (See Comments) 01/26/2018   Augmentin [amoxicillin-pot clavulanate] Diarrhea 07/04/2018   Carafate [sucralfate]  04/19/2016   Celexa [citalopram hydrobromide] Nausea And Vomiting 02/27/2016   Chantix [varenicline] Nausea And Vomiting 02/27/2016   Sulfa antibiotics Itching 01/11/2012   Sulfur Itching 03/03/2022   Wellbutrin [bupropion]  01/12/2016   Fosamax [alendronate sodium] Nausea And Vomiting 11/09/2014     Past Medical History   Past Medical History:  Diagnosis Date   Anxiety    COPD (chronic obstructive pulmonary disease) (HCC)    GERD (gastroesophageal reflux disease)    Hypercholesterolemia    Pre-diabetes    Reactive airway disease    Tachycardia     Past Surgical History   Past Surgical History:  Procedure Laterality Date   CATARACT EXTRACTION W/PHACO Left 01/17/2017   Procedure: CATARACT EXTRACTION PHACO AND INTRAOCULAR LENS PLACEMENT (IOC);  Surgeon: Tonny Branch, MD;  Location: [facility];  Service: Ophthalmology;  Laterality: Left;  CDE: 6.92   CATARACT EXTRACTION W/PHACO Right 03/07/2020   Procedure: CATARACT EXTRACTION PHACO AND INTRAOCULAR LENS PLACEMENT RIGHT EYE;  Surgeon: Baruch Goldmann, MD;  Location: [facility];  Service: Ophthalmology;  Laterality: Right;  CDE: 8.44   COLONOSCOPY  2013   RMR: Normal rectum. single diminutive sigmoid polyp noted above. Normal terminal ileum. Status post segmental biopsy. next TCS in 10 years.    COLONOSCOPY WITH PROPOFOL N/A 01/27/2022   Procedure: COLONOSCOPY WITH PROPOFOL;  Surgeon: Daneil Dolin, MD;  Location: [facility];  Service: Endoscopy;  Laterality: N/A;  12:45pm   ELBOW SURGERY Right  2023   ESOPHAGOGASTRODUODENOSCOPY  2013   RMR: Possible cervical esophageal web-status post dilation as described above. Small hiatal hernia. Status post biopsy of normal appearing small bowel to screen for celiac disease.    ESOPHAGOGASTRODUODENOSCOPY N/A 05/06/2016   normal esophagus, small hiatal hernia, empiric dilation.   ESOPHAGOGASTRODUODENOSCOPY (EGD) WITH PROPOFOL N/A 01/27/2022   Procedure: ESOPHAGOGASTRODUODENOSCOPY (EGD) WITH PROPOFOL;  Surgeon: Daneil Dolin, MD;  Location: [facility];  Service: Endoscopy;  Laterality: N/A;   EYE SURGERY Bilateral    Cat Sx - Symfony MF IOLs   MALONEY DILATION N/A 05/06/2016   Procedure: MALONEY DILATION;  Surgeon: Daneil Dolin, MD;  Location: [facility];  Service: Endoscopy;  Laterality: N/A;   MALONEY DILATION N/A 01/27/2022   Procedure: Venia Minks DILATION;  Surgeon: Daneil Dolin, MD;  Location: [facility];  Service: Endoscopy;  Laterality: N/A;   NECK SURGERY  10/2012   OVARIAN CYST SURGERY  1997   removed   POLYPECTOMY  01/27/2022   Procedure: POLYPECTOMY;  Surgeon: Daneil Dolin, MD;  Location: [facility];  Service: Endoscopy;;  cecal   TUBAL LIGATION  0962   YAG LASER APPLICATION Bilateral     Past Family History   Family History  Problem Relation Age of Onset   Colon cancer Maternal Grandfather 54   Colon polyps Mother 85   Colon cancer Maternal Uncle 50   Hypertension Father    Diabetes Father    Heart disease Father    Hyperlipidemia Father    Stroke Father     Past Social History   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    Employer: [employer]  Tobacco Use   Smoking status: Every Day    Packs/day: 1.00    Years: 25.00    Total pack years: 25.00    Types: Cigarettes   Smokeless tobacco:  Never  Vaping Use   Vaping Use: Never used  Substance and Sexual Activity   Alcohol use: No    Alcohol/week: 0.0 standard drinks of alcohol   Drug use: No   Sexual activity: Yes    Birth control/protection: Post-menopausal, Surgical  Other Topics Concern   Not on file  Social History Narrative   Lives at home w/ her husband   2 children - ages 35 & 53   Right-sided   Caffeine: 2 beverages per day   Social Determinants of Health   Financial Resource Strain: Not on file  Food Insecurity: Not on file  Transportation Needs: Not on file  Physical Activity: Not on file  Stress: Not on file  Social Connections: Not on file  Intimate Partner Violence: Not on file    Review of Systems   General: Negative for anorexia, weight loss, fever, chills, fatigue, weakness. ENT: Negative for hoarseness, difficulty swallowing , nasal congestion. CV: Negative for chest pain, angina, palpitations, dyspnea on exertion, peripheral edema.  Respiratory: Negative for dyspnea at rest, dyspnea on exertion, cough, sputum, wheezing.  GI: See history of present illness. GU:  Negative for dysuria, hematuria, urinary incontinence, urinary frequency, nocturnal urination.  Endo: Negative for unusual weight change.     Physical Exam   BP 114/76 (BP Location: Right Arm, Patient Position: Sitting, Cuff Size: Normal)   Pulse 90   Temp 99 F (37.2 C) (Oral)   Ht '5\' 3"'$  (1.6 m)   Wt 99 lb 12.8 oz (45.3 kg)   SpO2 99%   BMI 17.68 kg/m    General: Well-nourished, well-developed in no acute distress.  Eyes: No icterus. Mouth: Oropharyngeal mucosa moist and pink , no lesions erythema or exudate. Lungs: Clear to auscultation bilaterally.  Heart: Regular rate and rhythm, no murmurs rubs or gallops.  Abdomen: Bowel sounds are normal, nontender, nondistended, no hepatosplenomegaly or masses,  no abdominal bruits or hernia , no rebound or guarding.  Rectal: ***  Extremities: No lower extremity edema. No  clubbing or deformities. Neuro: Alert and oriented x 4   Skin: Warm and dry, no jaundice.   Psych: Alert and cooperative, normal mood and affect.  Labs   *** Imaging Studies  CT Abdomen Pelvis W Contrast  Result Date: 05/30/2022 CLINICAL DATA:  Left lower quadrant abdominal pain. Patient reports pain for 1 month. EXAM: CT ABDOMEN AND PELVIS WITH CONTRAST TECHNIQUE: Multidetector CT imaging of the abdomen and pelvis was performed using the standard protocol following bolus administration of intravenous contrast. Patient declined oral contrast. RADIATION DOSE REDUCTION: This exam was performed according to the departmental dose-optimization program which includes automated exposure control, adjustment of the mA and/or kV according to patient size and/or use of iterative reconstruction technique. CONTRAST:  78m OMNIPAQUE IOHEXOL 300 MG/ML  SOLN COMPARISON:  Pelvic ultrasound 05/11/2022. Noncontrast abdominopelvic CT 11/17/2021 FINDINGS: Lower chest: Clear lung bases no acute airspace disease. Hepatobiliary: Tiny scattered subcentimeter hypodensities within the liver, too small to characterize but likely small cysts or hemangiomas. No specific imaging follow-up is recommended. There is no suspicious liver lesion. Gallbladder physiologically distended, no calcified stone. No biliary dilatation. Pancreas: Unremarkable. No pancreatic ductal dilatation or surrounding inflammatory changes. Spleen: Normal in size without focal abnormality. Adrenals/Urinary Tract: Normal adrenal glands. No hydronephrosis or perinephric edema. Homogeneous renal enhancement with symmetric excretion on delayed phase imaging. No renal calculi. No suspicious renal lesion. Urinary bladder is completely empty at the time of the exam and not well assessed. Stomach/Bowel: Detailed bowel assessment is limited in the absence of enteric contrast and paucity of intra-abdominal fat. Stomach is nondistended further limiting assessment. No  small bowel obstruction or evidence of small bowel inflammation. The cecum is low-lying in the deep pelvis. Normal air-filled appendix is visualized. Moderate volume of stool within the ascending, transverse and proximal descending colon. The sigmoid colon is decompressed. No significant diverticular disease. No colonic wall thickening or pericolonic edema. No obvious colonic mass. Vascular/Lymphatic: Moderate aortic and branch atherosclerosis. No aneurysm. Patent portal, splenic and mesenteric veins. No suspicious abdominopelvic adenopathy. Reproductive: Retroverted uterus again seen. There is prominent periuterine and adnexal vascularity, left greater than right. Dilatation of the left ovarian vein measuring 7 mm. No adnexal mass. Other: Trace free fluid in the pelvis. No upper abdominal ascites. No focal fluid collection. No inguinal or abdominal wall hernia. Musculoskeletal: There are no acute or suspicious osseous abnormalities. No muscular findings to explain pain. IMPRESSION: 1. No acute abnormality. 2. Prominent periuterine and adnexal vascularity, left greater than right, as well as dilatation of the left ovarian vein. Findings can be seen with pelvic congestion syndrome in the appropriate clinical setting. 3. Moderate colonic stool burden. Aortic Atherosclerosis (ICD10-I70.0). Electronically Signed   By: MKeith RakeM.D.   On: 05/30/2022 10:32   [place] Complete With Transvaginal  Result Date: 05/11/2022 CLINICAL DATA:  Pelvic pain x2 weeks EXAM: TRANSABDOMINAL AND TRANSVAGINAL ULTRASOUND OF PELVIS TECHNIQUE: Both transabdominal and transvaginal ultrasound examinations of the pelvis were performed. Transabdominal technique was performed for global imaging of the pelvis including uterus, ovaries, adnexal regions, and pelvic cul-de-sac. It was necessary to proceed with endovaginal exam following the transabdominal exam to visualize the endometrium and ovaries. COMPARISON:  None Available.  FINDINGS: Uterus Measurements: 5.4 x 2.3 x 3.5 cm = volume: 23 mL. Uterus is retroverted. No focal abnormalities are seen in myometrium. Endometrium Thickness: 2.1 mm. Trace amount of fluid is seen in the endometrial cavity. Right ovary Measurements: 2.5 x 1.2 x 2 cm = volume: 3.2 ML. There are few small anechoic structures each measuring less than 1 cm in size suggesting follicles. Left ovary Measurements: 1.8 x 0.9 x 1.8 cm = volume: 1.4 mL. Normal appearance/no adnexal mass. Other findings Small amount  of free fluid is seen in pelvis, possibly due to recent rupture of ovarian cyst or follicle. IMPRESSION: Uterus is retroverted. Trace amount of fluid is seen in the endometrial cavity. Possible small follicles in right ovary. Trace amount of free fluid in pelvis may be physiological. Electronically Signed   By: Elmer Picker M.D.   On: 05/11/2022 11:33    Assessment       PLAN   ***   Laureen Ochs. Bobby Rumpf, [place], [facility]

## 2022-06-05 ENCOUNTER — Other Ambulatory Visit: Payer: Self-pay | Admitting: Family Medicine

## 2022-06-05 MED ORDER — OXYCODONE-ACETAMINOPHEN 5-325 MG PO TABS: ORAL_TABLET | ORAL | 0 refills | Status: DC

## 2022-06-08 ENCOUNTER — Telehealth: Payer: Self-pay | Admitting: Obstetrics & Gynecology

## 2022-06-08 ENCOUNTER — Ambulatory Visit: Payer: BC Managed Care – PPO | Admitting: Obstetrics & Gynecology

## 2022-06-08 ENCOUNTER — Encounter: Payer: Self-pay | Admitting: Obstetrics & Gynecology

## 2022-06-08 VITALS — BP 128/79 | HR 87 | Ht 63.0 in | Wt 101.0 lb

## 2022-06-08 DIAGNOSIS — N9489 Other specified conditions associated with female genital organs and menstrual cycle: Secondary | ICD-10-CM

## 2022-06-08 DIAGNOSIS — M25552 Pain in left hip: Secondary | ICD-10-CM | POA: Diagnosis not present

## 2022-06-08 DIAGNOSIS — N812 Incomplete uterovaginal prolapse: Secondary | ICD-10-CM | POA: Diagnosis not present

## 2022-06-08 DIAGNOSIS — R300 Dysuria: Secondary | ICD-10-CM

## 2022-06-08 LAB — POCT URINALYSIS DIPSTICK OB
Glucose, UA: NEGATIVE
Ketones, UA: NEGATIVE
Leukocytes, UA: NEGATIVE
Nitrite, UA: NEGATIVE
POC,PROTEIN,UA: NEGATIVE

## 2022-06-08 NOTE — Telephone Encounter (Signed)
Patient notified and stated she will contact specialist and let them know and see if they can send something to Dr Nicki Reaper or provide the work note extension.

## 2022-06-08 NOTE — Telephone Encounter (Signed)
Nurses  I certainly understand what the patient is stating We are certainly sympathetic to what is going on  But if a specialist feels that the patient needs to stay out of work longer at the very least in other words at the minimum That specialist would need to communicate to [place] that they do not feel the patient is able to go back to work and request [place] to extend the work note  So another words the patient needs to reconnect with her specialist to have them be more definitive in this area  (I have to have something to go on)

## 2022-06-08 NOTE — Telephone Encounter (Signed)
Patient's pcp (Dr. Wolfgang Phoenix) wants Dr Elonda Husky to send documentation on why patient needs to be out of work til MRI is completed for him to write letter. Please advise.

## 2022-06-08 NOTE — Progress Notes (Signed)
Chief Complaint  Patient presents with   possible prolapse    Pain and pressure in rectum      56 y.o. [id number] No LMP recorded. Patient is postmenopausal. The current method of family planning is tubal ligation + menopausal.  Outpatient Encounter Medications as of 06/08/2022  Medication Sig Note   acetaminophen (TYLENOL) 500 MG tablet Take 500 mg by mouth every 6 (six) hours as needed for moderate pain or headache.    albuterol (VENTOLIN HFA) 108 (90 Base) MCG/ACT inhaler Inhale 2 puffs into the lungs every 4 (four) hours as needed for wheezing or shortness of breath.    ALPRAZolam (XANAX) 0.25 MG tablet TAKE 1/2 TO 1 TABLET BY MOUTH TWICE DAILY AS NEEDED FOR ANXIETY. (Patient taking differently: Take 0.625 mg by mouth daily as needed for anxiety.) 02/21/2020: Pt takes 1/4 tabs   aspirin EC 81 MG tablet Take 81 mg by mouth daily. Swallow whole.    CALCIUM PO Take 750 mg by mouth 2 (two) times daily with a meal.    cholecalciferol (VITAMIN D3) 25 MCG (1000 UNIT) tablet Take 1,000 Units by mouth in the morning and at bedtime.    cycloSPORINE (RESTASIS) 0.05 % ophthalmic emulsion Place 1 drop into both eyes 2 (two) times daily.    denosumab (PROLIA) 60 MG/ML SOSY injection Inject 60 mg into the skin every 6 (six) months.    EPINEPHRINE 0.3 mg/0.3 mL IJ SOAJ injection INJECT INTO OUTER THIGH AND HOLD AGAINST LEG FOR 10 SECONDS FOR SEVERE ALLERGIC REACTION.    estradiol (ESTRACE) 0.1 MG/GM vaginal cream Apply a pea size amount of cream to urethral area of vagina twice weekly    hydrocortisone (ANUSOL-HC) 2.5 % rectal cream Place 1 Application rectally 2 (two) times daily. For 10 days    ibuprofen (ADVIL) 600 MG tablet Take 1 tablet (600 mg total) by mouth every 6 (six) hours as needed.    Misc Natural Products (ELDERBERRY IMMUNE COMPLEX PO) Take 1 capsule by mouth daily.    Multiple Vitamins-Minerals (MULTIVITAMIN WITH MINERALS) tablet Take 1 tablet by mouth daily.     omeprazole  (PRILOSEC) 40 MG capsule Take 1 capsule (40 mg total) by mouth daily.    oxyCODONE-acetaminophen (PERCOCET/ROXICET) 5-325 MG tablet 1 every 6 hours as needed severe pain caution drowsiness for infrequent use    PROLENSA 0.07 % SOLN INSTILL 1 DROP INTO THE RIGHT EYE TWICE DAILY.    rosuvastatin (CRESTOR) 10 MG tablet Take one tablet (10 mg) by mouth on Monday, Wednesday, and Friday.    silver sulfADIAZINE (SILVADENE) 1 % cream Apply BID prn (Patient taking differently: Apply 1 application  topically 2 (two) times daily as needed (burns).)    Triamcinolone Acetonide (NASACORT ALLERGY 24HR NA) Place 2 sprays into the nose daily as needed (allergies).    Vaginal Lubricant (REPLENS) GEL Place 1 Application vaginally every 3 (three) days.    No facility-administered encounter medications on file as of 06/08/2022.    Subjective The patient is referred from Dr. Lance Sell office for evaluation of an incidental finding of prominent pelvic vasculature and some uterine prolapse   To skip forward to some of the findings on exam to make the history more robust, and to address the reasons why she was initially newly referred to me,she is today found to have grade 2 uterine prolapse which I am sure is chronic and in no way is responsible for her significant increase in pain over the last several months.  She does not have a cystocele or rectocele There is an appropriate amount of stool in the vault  I have also reviewed the findings on her CT scan and she has mild changes of prominent venous vascularity in the left adnexal vessels and parametria without any evidence of thrombosis and certainly there is no evidence on exam or scan of diminished blood flow arterial or venous into the adnexa   With that being said the pt has been having sharp Left flank, lower quadrant, lower back radiating pain really since May(upon deeper questioning) She relates the pain began in association with strawberry picking season this  past May She also states that the pain is radiates to her inner left thigh  Upon provocative exam I am suspicious that this is related to her left hip Her back exam is benign Her pelvic exam is benign again except for the grade 2 prolapse which in no way would be causing the degree of pain she is having causing her to miss work etc.  My suspicion is that she may have injured her left hip during strawberry picking season which has gotten worse Her pain radiation into her groin and inner thigh on the left leg is classic for radiating hip pain  It is true she has grade 2 prolapse and it is true that she has benign prominent pelvic vasculature on the left but it is unrelated to the severity and distribution of the pain she has been experiencing  Separately I think at some point she would benefit from vaginal surgery to remove the uterus do a vaginal vault suspension and any anterior compartment stabilization that might be needed But again for now this is not in any way related to the pain she has been having   Past Medical History:  Diagnosis Date   Anxiety    COPD (chronic obstructive pulmonary disease) ([place])    GERD (gastroesophageal reflux disease)    Hypercholesterolemia    Pre-diabetes    Reactive airway disease    Tachycardia     Past Surgical History:  Procedure Laterality Date   CATARACT EXTRACTION W/PHACO Left 01/17/2017   Procedure: CATARACT EXTRACTION PHACO AND INTRAOCULAR LENS PLACEMENT ([place]);  Surgeon: Tonny Branch, MD;  Location: [facility];  Service: Ophthalmology;  Laterality: Left;  CDE: 6.92   CATARACT EXTRACTION W/PHACO Right 03/07/2020   Procedure: CATARACT EXTRACTION PHACO AND INTRAOCULAR LENS PLACEMENT RIGHT EYE;  Surgeon: Baruch Goldmann, MD;  Location: [facility];  Service: Ophthalmology;  Laterality: Right;  CDE: 8.44   COLONOSCOPY  2013   RMR: Normal rectum. single diminutive sigmoid polyp noted above. Normal terminal ileum. Status post segmental biopsy. next TCS in 10  years.    COLONOSCOPY WITH PROPOFOL N/A 01/27/2022   Procedure: COLONOSCOPY WITH PROPOFOL;  Surgeon: Daneil Dolin, MD;  Location: [facility];  Service: Endoscopy;  Laterality: N/A;  12:45pm   ELBOW SURGERY Right 2023   ESOPHAGOGASTRODUODENOSCOPY  2013   RMR: Possible cervical esophageal web-status post dilation as described above. Small hiatal hernia. Status post biopsy of normal appearing small bowel to screen for celiac disease.    ESOPHAGOGASTRODUODENOSCOPY N/A 05/06/2016   normal esophagus, small hiatal hernia, empiric dilation.   ESOPHAGOGASTRODUODENOSCOPY (EGD) WITH PROPOFOL N/A 01/27/2022   Procedure: ESOPHAGOGASTRODUODENOSCOPY (EGD) WITH PROPOFOL;  Surgeon: Daneil Dolin, MD;  [facility];  Service: Endoscopy;  Laterality: N/A;   EYE SURGERY Bilateral    Cat Sx - Symfony MF IOLs   MALONEY DILATION N/A 05/06/2016  Procedure: MALONEY DILATION;  Surgeon: Daneil Dolin, MD;  Location: [facility];  Service: Endoscopy;  Laterality: N/A;   MALONEY DILATION N/A 01/27/2022   Procedure: Venia Minks DILATION;  Surgeon: Daneil Dolin, MD;  Location: [facility];  Service: Endoscopy;  Laterality: N/A;   NECK SURGERY  10/2012   OVARIAN CYST SURGERY  1997   removed   POLYPECTOMY  01/27/2022   Procedure: POLYPECTOMY;  Surgeon: Daneil Dolin, MD;  Location: [facility];  Service: Endoscopy;;  cecal   TUBAL LIGATION  5027   YAG LASER APPLICATION Bilateral     OB History     Gravida  3   Para  2   Term  2   Preterm      AB  1   Living  2      SAB      IAB      Ectopic      Multiple      Live Births              Allergies  Allergen Reactions   Ciprofloxacin Other (See Comments)    Patient states with in five minutes of taking developed a splitting headache,itching all over and burning in mouth and lips.   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    Employer: [employer]  Tobacco Use   Smoking status: Every Day    Packs/day: 1.00    Years: 25.00    Total pack years: 25.00    Types: Cigarettes   Smokeless tobacco: Never  Vaping Use   Vaping Use: Never used  Substance and Sexual Activity   Alcohol use: No    Alcohol/week: 0.0 standard drinks of alcohol   Drug use: No   Sexual activity: Yes    Birth control/protection: Post-menopausal, Surgical  Other Topics Concern   Not on file  Social History Narrative   Lives at home w/ her husband   2 children - ages 76 & 53   Right-sided   Caffeine: 2 beverages per day   Social Determinants of Health   Financial Resource Strain: Low Risk  (06/08/2022)   Overall Financial Resource Strain (CARDIA)    Difficulty of Paying Living Expenses: Not hard at all  Food Insecurity: No Food Insecurity (06/08/2022)   Hunger Vital Sign    Worried About Running Out of Food in the Last Year: Never true    Ran Out of Food in the Last Year: Never true  Transportation Needs: No Transportation Needs (06/08/2022)   PRAPARE - Hydrologist (Medical): No    Lack of Transportation (Non-Medical): No  Physical Activity: Insufficiently Active (06/08/2022)   Exercise Vital Sign    Days of Exercise per Week: 2 days    Minutes of Exercise per Session: 10 min  Stress: No Stress Concern Present (06/08/2022)   [place]     Feeling of Stress : Not at all  Social Connections: Moderately Integrated (06/08/2022)   Social Connection and Isolation Panel [NHANES]    Frequency of Communication with Friends and Family: More than three times a week    Frequency of Social Gatherings with Friends and Family: More than three times a week    Attends Religious Services: More than 4 times per year    Active Member of [employer] or Organizations: No    Attends Archivist Meetings: Never    Marital Status: Married    Family History  Problem Relation Age of Onset   Colon cancer Maternal Grandfather 104   Colon polyps Mother 59   Colon cancer Maternal Uncle 42   Hypertension Father    Diabetes Father    Heart disease Father    Hyperlipidemia Father    Stroke Father     Medications:       Current Outpatient Medications:    acetaminophen (TYLENOL) 500 MG tablet, Take 500 mg by mouth every 6 (six) hours as needed for moderate pain or headache., Disp: , Rfl:    albuterol (VENTOLIN HFA) 108 (90 Base) MCG/ACT inhaler, Inhale 2 puffs into the lungs every 4 (four) hours as needed for wheezing or shortness of breath., Disp: 8 g, Rfl: 0   ALPRAZolam (XANAX) 0.25 MG tablet, TAKE 1/2 TO 1 TABLET BY MOUTH TWICE DAILY AS NEEDED FOR ANXIETY. (Patient taking differently: Take 0.625 mg by mouth daily as needed for anxiety.), Disp: 30 tablet, Rfl: 0   aspirin EC 81 MG tablet, Take 81 mg by mouth daily. Swallow whole., Disp: , Rfl:    CALCIUM PO, Take 750 mg by mouth 2 (two) times daily with a meal., Disp: , Rfl:    cholecalciferol (VITAMIN D3) 25 MCG (1000 UNIT) tablet, Take 1,000 Units by mouth in the morning and at bedtime., Disp: , Rfl:    cycloSPORINE (RESTASIS) 0.05 % ophthalmic emulsion, Place 1 drop into both eyes 2 (two) times daily., Disp: , Rfl:    denosumab (PROLIA) 60 MG/ML SOSY injection, Inject 60 mg into the skin every 6 (six) months., Disp: 1 mL, Rfl: 1   EPINEPHRINE 0.3 mg/0.3 mL IJ SOAJ injection, INJECT INTO OUTER  THIGH AND HOLD AGAINST LEG FOR 10 SECONDS FOR SEVERE ALLERGIC REACTION., Disp: 2 each, Rfl: 3   estradiol (ESTRACE) 0.1 MG/GM vaginal cream, Apply a pea size amount of cream to urethral area of vagina twice weekly, Disp: 42.5 g, Rfl: 3   hydrocortisone (ANUSOL-HC) 2.5 % rectal cream, Place 1 Application rectally 2 (two) times daily. For 10 days, Disp: 30 g, Rfl: 1   ibuprofen (ADVIL) 600 MG tablet, Take 1 tablet (600 mg total) by mouth every 6 (six) hours as needed., Disp: 30 tablet, Rfl: 0   Misc Natural Products (ELDERBERRY IMMUNE COMPLEX PO), Take 1 capsule by mouth daily., Disp: , Rfl:    Multiple Vitamins-Minerals (MULTIVITAMIN WITH MINERALS) tablet, Take 1 tablet by mouth daily. , Disp: , Rfl:    omeprazole (PRILOSEC) 40 MG capsule, Take 1 capsule (40 mg total) by mouth daily., Disp: 90 capsule, Rfl: 3   oxyCODONE-acetaminophen (PERCOCET/ROXICET) 5-325 MG tablet, 1 every 6 hours as needed severe pain caution drowsiness for infrequent use, Disp: 15 tablet, Rfl: 0   PROLENSA 0.07 % SOLN, INSTILL 1 DROP INTO THE RIGHT EYE TWICE DAILY., Disp: 3 mL, Rfl:  5   rosuvastatin (CRESTOR) 10 MG tablet, Take one tablet (10 mg) by mouth on Monday, Wednesday, and Friday., Disp: 30 tablet, Rfl: 3   silver sulfADIAZINE (SILVADENE) 1 % cream, Apply BID prn (Patient taking differently: Apply 1 application  topically 2 (two) times daily as needed (burns).), Disp: 50 g, Rfl: 6   Triamcinolone Acetonide (NASACORT ALLERGY 24HR NA), Place 2 sprays into the nose daily as needed (allergies)., Disp: , Rfl:    Vaginal Lubricant (REPLENS) GEL, Place 1 Application vaginally every 3 (three) days., Disp: , Rfl:   Objective Blood pressure 128/79, pulse 87, height '5\' 3"'$  (1.6 m), weight 101 lb (45.8 kg).  Abd soft non tender no guarding no rebound +pain with external rotation of the eft hip, not recreated on the right, it causes radiation to the left inner thigh as well General WDWN female NAD Vulva:  normal appearing vulva  with no masses, tenderness or lesions Vagina:  normal mucosa, no discharge Cervix:  Normal no lesions Uterus:  normal size, contour, position, consistency, mobility, non-tender, Grade 2 descent Adnexa: ovaries:present,  normal adnexa in size, nontender and no masses   Pertinent ROS No burning with urination, frequency or urgency No nausea, vomiting or diarrhea Nor fever chills or other constitutional symptoms   Labs or studies Reviewed CT scan images    Impression + Management Plan: Diagnoses this Encounter::   ICD-10-CM   1. Left hip pain  M25.552 MR HIP LEFT WO CONTRAST    MR HIP LEFT WO CONTRAST   lateral kenn movement recreates the pain    2. Dysuria  R30.0 POC Urinalysis Dipstick OB    Urine Culture    3. Pelvic congestion syndrome  N94.89    I do not think this is responsible for the pain she has been experiencing    4. Cystocele with second degree uterine prolapse  N81.2    again not likely related to pain        Medications prescribed during  this encounter: No orders of the defined types were placed in this encounter.   Labs or Scans Ordered during this encounter: Orders Placed This Encounter  Procedures   Urine Culture   MR HIP LEFT WO CONTRAST   POC Urinalysis Dipstick OB      Follow up Return in about 4 weeks (around 07/06/2022).

## 2022-06-10 ENCOUNTER — Encounter: Payer: Self-pay | Admitting: Family Medicine

## 2022-06-10 LAB — URINE CULTURE

## 2022-06-10 NOTE — Telephone Encounter (Signed)
Thanks for the explanation it does help

## 2022-06-10 NOTE — Telephone Encounter (Signed)
Nurses Dr Elonda Husky sent a message stating that he ordered MRI and he will be completed the middle of the month Patient relates that she is unable to work because of her pain Please do the following If we are going to be doing the work note we will also need to do a follow-up visit She may have a work note through November 21 Also patient should gradually try to engage with activity to see if she is gradually getting better enough to go back to work at that time or perhaps sooner The only way that she would be able to know for certain is to try increasing her activity gradually Recommend follow-up visit with myself either November 20 or 21.  By that time we should have MRI results back.  Thanks-Dr. Nicki Reaper

## 2022-06-13 NOTE — Telephone Encounter (Signed)
Patient was given a work note sure have her MRIs should do a follow-up visit with [place] if still unable to go back to work at that point referral to physical therapy physical medicine doctor etc.

## 2022-06-14 ENCOUNTER — Encounter: Payer: Self-pay | Admitting: Obstetrics & Gynecology

## 2022-06-24 ENCOUNTER — Ambulatory Visit (HOSPITAL_COMMUNITY): Payer: BC Managed Care – PPO

## 2022-06-28 ENCOUNTER — Encounter: Payer: Self-pay | Admitting: Family Medicine

## 2022-06-28 ENCOUNTER — Telehealth: Payer: Self-pay | Admitting: Family Medicine

## 2022-06-28 ENCOUNTER — Ambulatory Visit: Payer: BC Managed Care – PPO | Admitting: Family Medicine

## 2022-06-28 VITALS — BP 138/88 | Wt 101.2 lb

## 2022-06-28 DIAGNOSIS — M5432 Sciatica, left side: Secondary | ICD-10-CM | POA: Diagnosis not present

## 2022-06-28 DIAGNOSIS — M25552 Pain in left hip: Secondary | ICD-10-CM

## 2022-06-28 MED ORDER — HYDROCODONE-ACETAMINOPHEN 5-325 MG PO TABS: 1.0000 | ORAL_TABLET | ORAL | 0 refills | Status: AC | PRN

## 2022-06-28 MED ORDER — HYDROCODONE-ACETAMINOPHEN 5-325 MG PO TABS: 1.0000 | ORAL_TABLET | ORAL | 0 refills | Status: DC | PRN

## 2022-06-28 NOTE — Telephone Encounter (Signed)
Patient is  needing work excuse from 06/28/22-07/18/2022 returning on 07/19/2022

## 2022-06-28 NOTE — Progress Notes (Signed)
   Subjective:    Patient ID: Isabel Atkinson, female    DOB: [birth year], 56 y.o.   MRN: [id number]  HPI Pt arrives due to left hip pain. Radiates into groin. MRI scan originally cancelled but now has one schedule for December.   Pt also has disability papers. Pt has been OOW since October 3rd due to hip pain. No work related incident.  She complains of pain in the left hip region in the groin region hurts with squatting twisting lifting pain is severe enough to wear during the day she will take acetaminophen but in the evenings oxycodone We did discuss possibly reducing this down to hydrocodone she will try this and see how it does Unable to return to work currently  Review of Systems     Objective:   Physical Exam Increased pain in his comfort in the left back left hip area to palpation positive straight leg raise on the left negative on the right no increased pain with internal and external rotation       Assessment & Plan:  Left pelvic/hip/groin pain-it is reasonable to look at the hip with MRI but it is also quite possible that this could be an issue with lumbar spine causing impingement of S1 causing similar symptoms  She has a MRI scheduled in early December She is unable to work currently because she cannot do lifting of 50 pounds cannot do squatting twisting stooping with this discomfort  Hydrocodone for severe pain caution drowsiness she has taken this before She also saw [facility] they are in the process of doing physical therapy possibly MRI of the lumbar spine  Patient will have an MRI in December out of work through December 11 quite possible she may need to have MRI of the lumbar spine as well In addition to this patient has inability to do her job because she cannot lift the amount that she has to working in [employer] cannot lift cannot twist turn squat bend or lift over 50 pounds  Currently right now it is recommended for her to avoid squatting.  Avoid  lifting greater than 15 pounds.  Avoid repetitive twisting turning.  Avoid bending.  Avoid going up and down steps.

## 2022-06-29 ENCOUNTER — Telehealth: Payer: Self-pay | Admitting: Family Medicine

## 2022-06-29 NOTE — Telephone Encounter (Signed)
Front Please connect with patient it would be wise to put her on for a follow-up visit in 3 weeks which will be after her MRI.  Let the patient know there is possibility that we may not need to see her but I would like to go ahead and establish an office visit because his schedule is filling up fast.  This will also allow [place] to document appropriately on her insurance policy form that we are filling out that we are doing a follow-up regarding this thank you  Please schedule her somewhere between December 10 and December 20 with me thank you

## 2022-06-30 NOTE — Telephone Encounter (Signed)
Front Please connect with patient it would be wise to put her on for a follow-up visit in 3 weeks which will be after her MRI.  Let the patient know there is possibility that we may not need to see her but I would like to go ahead and establish an office visit because his schedule is filling up fast.  This will also allow [place] to document appropriately on her insurance policy form that we are filling out that we are doing a follow-up regarding this thank you   Please schedule her somewhere between December 10 and December 20 with me thank you

## 2022-07-05 ENCOUNTER — Other Ambulatory Visit: Payer: Self-pay | Admitting: Family Medicine

## 2022-07-05 MED ORDER — OXYCODONE-ACETAMINOPHEN 5-325 MG PO TABS: 1.0000 | ORAL_TABLET | Freq: Four times a day (QID) | ORAL | 0 refills | Status: AC | PRN

## 2022-07-06 ENCOUNTER — Ambulatory Visit: Payer: BC Managed Care – PPO | Admitting: Obstetrics & Gynecology

## 2022-07-06 ENCOUNTER — Encounter: Payer: Self-pay | Admitting: Obstetrics & Gynecology

## 2022-07-06 VITALS — BP 127/67 | HR 87 | Ht 63.0 in

## 2022-07-06 DIAGNOSIS — M25552 Pain in left hip: Secondary | ICD-10-CM

## 2022-07-06 DIAGNOSIS — N812 Incomplete uterovaginal prolapse: Secondary | ICD-10-CM

## 2022-07-06 DIAGNOSIS — R300 Dysuria: Secondary | ICD-10-CM | POA: Diagnosis not present

## 2022-07-06 DIAGNOSIS — N9489 Other specified conditions associated with female genital organs and menstrual cycle: Secondary | ICD-10-CM

## 2022-07-13 ENCOUNTER — Other Ambulatory Visit (HOSPITAL_COMMUNITY): Payer: Self-pay | Admitting: Otolaryngology

## 2022-07-13 DIAGNOSIS — M545 Low back pain, unspecified: Secondary | ICD-10-CM

## 2022-07-14 ENCOUNTER — Ambulatory Visit (HOSPITAL_COMMUNITY): Payer: BC Managed Care – PPO

## 2022-07-15 ENCOUNTER — Other Ambulatory Visit (HOSPITAL_COMMUNITY): Payer: Self-pay | Admitting: Otolaryngology

## 2022-07-15 ENCOUNTER — Ambulatory Visit (HOSPITAL_COMMUNITY)
Admission: RE | Admit: 2022-07-15 | Discharge: 2022-07-15 | Disposition: A | Discharge status: Home / Self Care | Payer: BC Managed Care – PPO | Source: Ambulatory Visit | Attending: Otolaryngology | Admitting: Otolaryngology

## 2022-07-15 DIAGNOSIS — M25552 Pain in left hip: Secondary | ICD-10-CM

## 2022-07-16 ENCOUNTER — Encounter: Payer: Self-pay | Admitting: Family Medicine

## 2022-07-16 ENCOUNTER — Ambulatory Visit (INDEPENDENT_AMBULATORY_CARE_PROVIDER_SITE_OTHER): Payer: BC Managed Care – PPO | Admitting: Family Medicine

## 2022-07-16 VITALS — BP 116/76 | HR 84 | Temp 98.2°F | Ht 63.0 in | Wt 100.0 lb

## 2022-07-16 DIAGNOSIS — R102 Pelvic and perineal pain: Secondary | ICD-10-CM

## 2022-07-16 DIAGNOSIS — R319 Hematuria, unspecified: Secondary | ICD-10-CM | POA: Diagnosis not present

## 2022-07-16 DIAGNOSIS — Z23 Encounter for immunization: Secondary | ICD-10-CM

## 2022-07-16 LAB — POCT URINALYSIS DIP (CLINITEK)
Bilirubin, UA: NEGATIVE
Glucose, UA: NEGATIVE mg/dL
Ketones, POC UA: NEGATIVE mg/dL
Leukocytes, UA: NEGATIVE
Nitrite, UA: NEGATIVE
Spec Grav, UA: >=1.03 — AB (ref 1.010–1.025)
Urobilinogen, UA: 0.2 E.U./dL
pH, UA: 5 (ref 5.0–8.0)

## 2022-07-16 MED ORDER — OXYCODONE-ACETAMINOPHEN 5-325 MG PO TABS: ORAL_TABLET | ORAL | 0 refills | Status: DC

## 2022-07-16 NOTE — Progress Notes (Addendum)
   Subjective:    Patient ID: Isabel Atkinson, female    DOB: 05-17-1966, 56 y.o.   MRN: [id number]  HPI MRI results Low pelvic pain  Flu shot today MRI was reviewed hip is normal  On a previous urine the patient had trace blood on the dipstick but nothing microscopic we repeated it today and had blood on the dipstick microscope was brought up so therefore we sent the urine for urinalysis with microscopy  Patient does have a history of kidney stones Review of Systems     Objective:   Physical Exam  Subjective discomfort in the lower back left side increased pain radiation around to the groin region  She relates significant pain in the lower back around the side of her buttock into the groin she states it is exacerbated by squatting pushing pulling lifting With her job she has to be able to lift 20 to 30 pounds at any time to be able to squat twist push and pull she states she cannot do it currently and she is currently taking a half of an oxycodone a couple times a day    Assessment & Plan:  Hip pain is referred More than likely coming from lumbar area If lumbar MRI looks normal then revisit pelvic pain with gynecology Oxycodone for sparing use patient states hydrocodone does not help and relates NSAIDs do not help MRI lumbar spine coming up [facility]es [facility] follow-up with [facility] after MRI Patient to notify us when MRI is completed Getting physical therapy Unable to return to work work excuse written through January 3  We did discuss how oxycodone is a very small pain medicine Long-term it would be in her best interest to get away from this hopefully the MRI will give [place] some clue as to whether or not she would benefit from an injection or possibly surgery and then be able to get away from being on pain medicine

## 2022-07-20 LAB — MICROSCOPIC EXAMINATION
Bacteria, UA: NONE SEEN
Casts: NONE SEEN /lpf
Epithelial Cells (non renal): NONE SEEN /hpf (ref 0–10)
WBC, UA: NONE SEEN /hpf (ref 0–5)

## 2022-07-20 LAB — URINALYSIS, ROUTINE W REFLEX MICROSCOPIC
Bilirubin, UA: NEGATIVE
Glucose, UA: NEGATIVE
Ketones, UA: NEGATIVE
Leukocytes,UA: NEGATIVE
Nitrite, UA: NEGATIVE
RBC, UA: NEGATIVE
Specific Gravity, UA: 1.027 (ref 1.005–1.030)
Urobilinogen, Ur: 0.2 mg/dL (ref 0.2–1.0)
pH, UA: 5.5 (ref 5.0–7.5)

## 2022-07-20 LAB — SPECIMEN STATUS REPORT

## 2022-07-20 LAB — CHLAMYDIA/GC AMPLIFICATION

## 2022-07-23 ENCOUNTER — Other Ambulatory Visit: Payer: Self-pay | Admitting: Family Medicine

## 2022-07-23 DIAGNOSIS — R809 Proteinuria, unspecified: Secondary | ICD-10-CM

## 2022-07-27 LAB — MICROALBUMIN / CREATININE URINE RATIO
Creatinine, Urine: 151.8 mg/dL
Microalb/Creat Ratio: 8 mg/g creat (ref 0–29)
Microalbumin, Urine: 12.6 ug/mL

## 2022-07-30 ENCOUNTER — Ambulatory Visit (HOSPITAL_COMMUNITY)
Admission: RE | Admit: 2022-07-30 | Discharge: 2022-07-30 | Disposition: A | Discharge status: Home / Self Care | Payer: BC Managed Care – PPO | Source: Ambulatory Visit | Attending: Otolaryngology | Admitting: Otolaryngology

## 2022-07-30 DIAGNOSIS — M545 Low back pain, unspecified: Secondary | ICD-10-CM | POA: Insufficient documentation

## 2022-08-12 ENCOUNTER — Other Ambulatory Visit: Payer: Self-pay | Admitting: Family Medicine

## 2022-08-12 ENCOUNTER — Other Ambulatory Visit (HOSPITAL_COMMUNITY)
Admission: RE | Admit: 2022-08-12 | Discharge: 2022-08-12 | Disposition: A | Discharge status: Home / Self Care | Payer: BC Managed Care – PPO | Source: Ambulatory Visit | Attending: Internal Medicine | Admitting: Internal Medicine

## 2022-08-12 MED ORDER — OXYCODONE-ACETAMINOPHEN 5-325 MG PO TABS: ORAL_TABLET | ORAL | 0 refills | Status: DC

## 2022-08-16 NOTE — Progress Notes (Unsigned)
Cardiology Office Note   Date:  08/16/2022   ID:  Isabel Atkinson, DOB May 07, 1966, MRN [id number]  PCP:  Kathyrn Drown, MD  Cardiologist:  Taquila Leys/ Jory Sims NP   Pt presents for eval of CAD    History of Present Illness: Isabel Atkinson is a 57 y.o. female with a history of palpitations, COPD, GERD, and neuropathy.  Event monitor was neg for arrhythmia, Myoview was normal.  The pt was last in cardiology clinc in Jan 2021   Since then she says her palpitations have gone away   She is off of her b blocker  In Aug 2022 she had a CT of chest for  lung cancer screening   Coronary calcifications noted as well as aorta calcifications  She is here today to discuss  Pt denies CP   She stays very busy   Works in [employer] care of grandkids   Says activty is good    No SOB   No palpitations   Takes Crestor 2x per week   Slows her down if she takes more often   She says she is still smoking about 1ppd   Trying to quit   Outpatient Medications Prior to Visit  Medication Sig Dispense Refill   acetaminophen (TYLENOL) 500 MG tablet Take 500 mg by mouth every 6 (six) hours as needed for moderate pain or headache.     albuterol (VENTOLIN HFA) 108 (90 Base) MCG/ACT inhaler Inhale 2 puffs into the lungs every 4 (four) hours as needed for wheezing or shortness of breath. 8 g 0   ALPRAZolam (XANAX) 0.25 MG tablet TAKE 1/2 TO 1 TABLET BY MOUTH TWICE DAILY AS NEEDED FOR ANXIETY. (Patient taking differently: Take 0.625 mg by mouth daily as needed for anxiety.) 30 tablet 0   aspirin EC 81 MG tablet Take 81 mg by mouth daily. Swallow whole.     CALCIUM PO Take 750 mg by mouth 2 (two) times daily with a meal.     cholecalciferol (VITAMIN D3) 25 MCG (1000 UNIT) tablet Take 1,000 Units by mouth in the morning and at bedtime.     cycloSPORINE (RESTASIS) 0.05 % ophthalmic emulsion Place 1 drop into both eyes 2 (two) times daily.     denosumab (PROLIA) 60 MG/ML SOSY injection Inject 60 mg into the  skin every 6 (six) months. 1 mL 1   EPINEPHRINE 0.3 mg/0.3 mL IJ SOAJ injection INJECT INTO OUTER THIGH AND HOLD AGAINST LEG FOR 10 SECONDS FOR SEVERE ALLERGIC REACTION. 2 each 3   estradiol (ESTRACE) 0.1 MG/GM vaginal cream Apply a pea size amount of cream to urethral area of vagina twice weekly 42.5 g 3   Misc Natural Products (ELDERBERRY IMMUNE COMPLEX PO) Take 1 capsule by mouth daily.     Multiple Vitamins-Minerals (MULTIVITAMIN WITH MINERALS) tablet Take 1 tablet by mouth daily.      omeprazole (PRILOSEC) 40 MG capsule Take 1 capsule (40 mg total) by mouth daily. 90 capsule 3   OVER THE COUNTER MEDICATION Cranberry vitamin     oxyCODONE-acetaminophen (PERCOCET/ROXICET) 5-325 MG tablet 1 q4 hours prn pain use sparingly 24 tablet 0   PROLENSA 0.07 % SOLN INSTILL 1 DROP INTO THE RIGHT EYE TWICE DAILY. 3 mL 5   rosuvastatin (CRESTOR) 10 MG tablet Take one tablet (10 mg) by mouth on Monday, Wednesday, and Friday. 30 tablet 3   silver sulfADIAZINE (SILVADENE) 1 % cream Apply BID prn (Patient taking differently: Apply 1 application  topically 2 (two) times daily as needed (burns).) 50 g 6   Triamcinolone Acetonide (NASACORT ALLERGY 24HR NA) Place 2 sprays into the nose daily as needed (allergies).     Vaginal Lubricant (REPLENS) GEL Place 1 Application vaginally every 3 (three) days.     No facility-administered medications prior to visit.     Allergies:   Ciprofloxacin, Augmentin [amoxicillin-pot clavulanate], Carafate [sucralfate], Celexa [citalopram hydrobromide], Chantix [varenicline], Sulfa antibiotics, Sulfur, Wellbutrin [bupropion], and Fosamax [alendronate sodium]   Past Medical History:  Diagnosis Date   Anxiety    COPD (chronic obstructive pulmonary disease) (HCC)    GERD (gastroesophageal reflux disease)    Hypercholesterolemia    Pre-diabetes    Reactive airway disease    Tachycardia     Past Surgical History:  Procedure Laterality Date   CATARACT EXTRACTION W/PHACO Left  01/17/2017   Procedure: CATARACT EXTRACTION PHACO AND INTRAOCULAR LENS PLACEMENT ([place]);  Surgeon: Tonny Branch, MD;  Location: [facility];  Service: Ophthalmology;  Laterality: Left;  CDE: 6.92   CATARACT EXTRACTION W/PHACO Right 03/07/2020   Procedure: CATARACT EXTRACTION PHACO AND INTRAOCULAR LENS PLACEMENT RIGHT EYE;  Surgeon: Baruch Goldmann, MD;  Location: [facility];  Service: Ophthalmology;  Laterality: Right;  CDE: 8.44   COLONOSCOPY  2013   RMR: Normal rectum. single diminutive sigmoid polyp noted above. Normal terminal ileum. Status post segmental biopsy. next TCS in 10 years.    COLONOSCOPY WITH PROPOFOL N/A 01/27/2022   Procedure: COLONOSCOPY WITH PROPOFOL;  Surgeon: Daneil Dolin, MD;  Location: [facility];  Service: Endoscopy;  Laterality: N/A;  12:45pm   ELBOW SURGERY Right 2023   ESOPHAGOGASTRODUODENOSCOPY  2013   RMR: Possible cervical esophageal web-status post dilation as described above. Small hiatal hernia. Status post biopsy of normal appearing small bowel to screen for celiac disease.    ESOPHAGOGASTRODUODENOSCOPY N/A 05/06/2016   normal esophagus, small hiatal hernia, empiric dilation.   ESOPHAGOGASTRODUODENOSCOPY (EGD) WITH PROPOFOL N/A 01/27/2022   Procedure: ESOPHAGOGASTRODUODENOSCOPY (EGD) WITH PROPOFOL;  Surgeon: Daneil Dolin, MD;  [facility];  Service: Endoscopy;  Laterality: N/A;   EYE SURGERY Bilateral    Cat Sx - Symfony MF IOLs   MALONEY DILATION N/A 05/06/2016   Procedure: MALONEY DILATION;  Surgeon: Daneil Dolin, MD;  Location: [facility];  Service: Endoscopy;  Laterality: N/A;   MALONEY DILATION N/A 01/27/2022   Procedure: Venia Minks DILATION;  Surgeon: Daneil Dolin, MD;  Location: [facility];  Service: Endoscopy;  Laterality: N/A;   NECK SURGERY  10/2012   OVARIAN CYST SURGERY  1997   removed   POLYPECTOMY  01/27/2022   Procedure: POLYPECTOMY;  Surgeon: Daneil Dolin, MD;  Location: [facility];  Service: Endoscopy;;  cecal   TUBAL  LIGATION  5053   YAG LASER APPLICATION Bilateral      Social History:  The patient  reports that she has been smoking cigarettes. She has a 25.00 pack-year smoking history. She has never used smokeless tobacco. She reports that she does not drink alcohol and does not use drugs.   Family History:  The patient's family history includes Colon cancer (age of onset: 66) in her maternal uncle; Colon cancer (age of onset: 26) in her maternal grandfather; Colon polyps (age of onset: 71) in her mother; Diabetes in her father; Heart disease in her father; Hyperlipidemia in her father; Hypertension in her father; Stroke in her father.    ROS:  Please see the history of present illness. All  other systems are reviewed and  Negative to the above problem except as noted.    PHYSICAL EXAM: VS:  There were no vitals taken for this visit.  GEN: Thin 57 yo  in no acute distress  HEENT: normal  Neck: no JVD, no carotid bruits Cardiac: RRR; no murmurs,  No LE edema  Respiratory:  clear to auscultation bilaterally, normal work of breathing GI: soft, nontender, nondistended, + BS  No hepatomegaly  MS: no deformity Moving all extremities   Skin: warm and dry, no rash Neuro:  Strength and sensation are intact Psych: euthymic mood, full affect  EKG  SR 90  Nonspecific ST changes     Lipid Panel    Component Value Date/Time   CHOL 158 09/02/2021 0936   TRIG 96 09/02/2021 0936   HDL 59 09/02/2021 0936   CHOLHDL 2.7 09/02/2021 0936   CHOLHDL 2.5 11/03/2013 0951   VLDL 13 11/03/2013 0951   LDLCALC 81 09/02/2021 0936      Wt Readings from Last 3 Encounters:  07/16/22 100 lb (45.4 kg)  06/28/22 101 lb 3.2 oz (45.9 kg)  06/08/22 101 lb (45.8 kg)    EKG   Not done today   ASSESSMENT AND PLAN:  1  CAD  Pt with CAD on CT scan   Reviewed images with her    Nothing sounds flow limiting   She is active   Follow   Rx risk factors Lipids   BP    Take ecASA 81 mg   2  HTN  Pt says her BP is never this  high   FOllow   Get cuff at home compared to other cuffs  Call if high    Plan to follow in summer   3  HL   Last lpids in 2021  LDL 93  Will get lipomed   Need tight control  4 Palpitations   Pt denies   Follow   5   Tob use    Counselled on cessaton     F/U will be July 2023  Current medicines are reviewed at length with the patient today.  The patient does not have concerns regarding medicines.  Signed, Dorris Carnes, MD  08/16/2022 9:03 PM    [facility] [address], [place], [place]  [address] Phone: [phone]; Fax: [phone]

## 2022-08-17 ENCOUNTER — Ambulatory Visit: Payer: BC Managed Care – PPO | Attending: Internal Medicine | Admitting: Internal Medicine

## 2022-08-17 ENCOUNTER — Other Ambulatory Visit (HOSPITAL_COMMUNITY)
Admission: RE | Admit: 2022-08-17 | Discharge: 2022-08-17 | Disposition: A | Discharge status: Home / Self Care | Payer: BC Managed Care – PPO | Source: Ambulatory Visit | Attending: Internal Medicine | Admitting: Internal Medicine

## 2022-08-17 VITALS — BP 122/64 | HR 89 | Ht 62.0 in | Wt 99.0 lb

## 2022-08-17 DIAGNOSIS — Z79899 Other long term (current) drug therapy: Secondary | ICD-10-CM

## 2022-08-17 DIAGNOSIS — R002 Palpitations: Secondary | ICD-10-CM

## 2022-08-17 DIAGNOSIS — M5430 Sciatica, unspecified side: Secondary | ICD-10-CM

## 2022-08-17 DIAGNOSIS — E785 Hyperlipidemia, unspecified: Secondary | ICD-10-CM | POA: Insufficient documentation

## 2022-08-17 LAB — CBC
HCT: 39 % (ref 36.0–46.0)
Hemoglobin: 12.5 g/dL (ref 12.0–15.0)
MCH: 30 pg (ref 26.0–34.0)
MCHC: 32.1 g/dL (ref 30.0–36.0)
MCV: 93.5 fL (ref 80.0–100.0)
Platelets: 286 10*3/uL (ref 150–400)
RBC: 4.17 MIL/uL (ref 3.87–5.11)
RDW: 13.7 % (ref 11.5–15.5)
WBC: 11 10*3/uL — ABNORMAL HIGH (ref 4.0–10.5)
nRBC: 0 % (ref 0.0–0.2)

## 2022-08-17 LAB — HEPATIC FUNCTION PANEL
ALT: 14 U/L (ref 0–44)
AST: 15 U/L (ref 15–41)
Albumin: 3.9 g/dL (ref 3.5–5.0)
Alkaline Phosphatase: 58 U/L (ref 38–126)
Bilirubin, Direct: 0.1 mg/dL (ref 0.0–0.2)
Indirect Bilirubin: 0.5 mg/dL (ref 0.3–0.9)
Total Bilirubin: 0.6 mg/dL (ref 0.3–1.2)
Total Protein: 6.8 g/dL (ref 6.5–8.1)

## 2022-08-17 MED ORDER — VITAMIN D (ERGOCALCIFEROL) 1.25 MG (50000 UNIT) PO CAPS: 50000.0000 [IU] | ORAL_CAPSULE | ORAL | 11 refills | Status: DC

## 2022-08-17 NOTE — Addendum Note (Signed)
Addended by: Berlinda Last on: 08/17/2022 04:39 PM   Modules accepted: Orders

## 2022-08-17 NOTE — Patient Instructions (Signed)
Medication Instructions:  Your physician recommends that you continue on your current medications as directed. Please refer to the Current Medication list given to you today.  Take Vitamin D 50,000 units weekly   *If you need a refill on your cardiac medications before your next appointment, please call your pharmacy*   Lab Work: Your physician recommends that you return for lab work in: Today   If you have labs (blood work) drawn today and your tests are completely normal, you will receive your results only by: [place] (if you have MyChart) OR A paper copy in the mail If you have any lab test that is abnormal or we need to change your treatment, we will call you to review the results.   Testing/Procedures: NONE    Follow-Up: At [facility], you and your health needs are our priority.  As part of our continuing mission to provide you with exceptional heart care, we have created designated Provider Care Teams.  These Care Teams include your primary Cardiologist (physician) and Advanced Practice Providers (APPs -  Physician Assistants and Nurse Practitioners) who all work together to provide you with the care you need, when you need it.  We recommend signing up for the patient portal called "MyChart".  Sign up information is provided on this After Visit Summary.  MyChart is used to connect with patients for Virtual Visits (Telemedicine).  Patients are able to view lab/test results, encounter notes, upcoming appointments, etc.  Non-urgent messages can be sent to your provider as well.   To learn more about what you can do with MyChart, go to [email or web address].    Your next appointment:   1 year(s)  The format for your next appointment:   In Person  Provider:   Dorris Carnes, MD    Other Instructions Thank you for choosing [facility]!    Important Information About Sugar

## 2022-08-18 ENCOUNTER — Telehealth: Payer: Self-pay

## 2022-08-18 ENCOUNTER — Other Ambulatory Visit (INDEPENDENT_AMBULATORY_CARE_PROVIDER_SITE_OTHER): Payer: Self-pay | Admitting: Ophthalmology

## 2022-08-18 LAB — NMR, LIPOPROFILE
Cholesterol, Total: 166 mg/dL (ref 100–199)
HDL Cholesterol by NMR: 58 mg/dL (ref >39)
HDL Particle Number: 35 umol/L (ref >=30.5)
LDL Particle Number: 1307 nmol/L — ABNORMAL HIGH (ref <1000)
LDL Size: 20.6 nm (ref >20.5)
LDL-C (NIH Calc): 91 mg/dL (ref 0–99)
LP-IR Score: 25 (ref <=45)
Small LDL Particle Number: 560 nmol/L — ABNORMAL HIGH (ref <=527)
Triglycerides by NMR: 94 mg/dL (ref 0–149)

## 2022-08-18 NOTE — Telephone Encounter (Signed)
Type of form received:Medical Report Disability Review   Additional comments: Forms placed in Dr Nicki Reaper folder to be completed call pt when ready to be picked up   Received by:Christana Angelica  Form should be Faxed/mailed to: (address/ fax #)None  Is patient requesting call for pickup:Yes  Form placed:  Placed in Dr Nicki Reaper folder   Attach charge sheet.  Provider will determine charge.10.00  Individual made aware of 3-5 business day turn around Yes

## 2022-08-23 ENCOUNTER — Ambulatory Visit (INDEPENDENT_AMBULATORY_CARE_PROVIDER_SITE_OTHER): Payer: BC Managed Care – PPO | Admitting: Family Medicine

## 2022-08-23 ENCOUNTER — Telehealth: Payer: Self-pay | Admitting: *Deleted

## 2022-08-23 VITALS — BP 130/80 | HR 83 | Temp 98.8°F | Wt 101.6 lb

## 2022-08-23 DIAGNOSIS — R6889 Other general symptoms and signs: Secondary | ICD-10-CM

## 2022-08-23 DIAGNOSIS — E785 Hyperlipidemia, unspecified: Secondary | ICD-10-CM

## 2022-08-23 DIAGNOSIS — J019 Acute sinusitis, unspecified: Secondary | ICD-10-CM

## 2022-08-23 MED ORDER — DOXYCYCLINE HYCLATE 100 MG PO TABS: 100.0000 mg | ORAL_TABLET | Freq: Two times a day (BID) | ORAL | 0 refills | Status: DC

## 2022-08-23 MED ORDER — ROSUVASTATIN CALCIUM 10 MG PO TABS: 10.0000 mg | ORAL_TABLET | Freq: Every day | ORAL | 3 refills | Status: DC

## 2022-08-23 NOTE — Telephone Encounter (Signed)
Pt notified and order placed 

## 2022-08-23 NOTE — Progress Notes (Addendum)
   Subjective:    Patient ID: Isabel Atkinson, female    DOB: 1965/10/30, 57 y.o.   MRN: [id number]  Cough The problem has been gradually worsening. The cough is Non-productive. Associated symptoms include headaches. The symptoms are aggravated by lying down.  Sinus Problem Associated symptoms include coughing, headaches and sinus pressure.  Patient not feeling good symptoms present for the past week plus now with some head congestion drainage and coughing.  Denies high fever chills wheezing difficulty breathing currently.  Patient does have history of smoking  Also has back trouble tried injections tried seen [facility] has ongoing troubles pain radiating into her leg unable to do any bending squatting lifting unable to work.    Review of Systems  HENT:  Positive for sinus pressure.   Respiratory:  Positive for cough.   Neurological:  Positive for headaches.       Objective:   Physical Exam  Lungs are clear HEENT benign mild sinus tenderness      Assessment & Plan:  Flulike illness present over the past several days with bodyaches headache not feeling good triple swab taken await results  Acute rhinosinusitis antibiotics prescribed warning signs discussed follow-up if problems  Patient also has a neurosurgical issue in her back that is causing impingement of the nerve that is causing pain into her hip and leg which is preventing her from being able to do her work.  We will fill out her FMLA to go through February 3.  Based upon what the neurosurgeon has to say when they see her next week that would help judge whether or not she needs to be out longer.  This should be noted that the patient started having this problem this past fall with back pain radiating into the left hip and around to the groin region has been seen by gynecologist had an MRI of the hip MRI of the lumbar spine seen by [place] several times has had physical therapy also seeing spine specialist close also has  had injections without success in addition to this she has tried anti-inflammatories Tylenol and oxycodone Despite physical therapy medications and injections she still has severe pain worsened by movement unable to do squatting twisting bending stooping crawling lifting unable to do ladders and steps unable to do her job which requires lifting on a regular basis of more than 20 pounds She is also unable to do repetitive motions such as twisting lifting she also has significant pain that is exacerbated by standing for significant length of time preventing her from doing any type of standing more than 30 minutes at 1 time.  Currently unable to lift more than 5 pounds at a time

## 2022-08-23 NOTE — Telephone Encounter (Signed)
-----  Message from Nuala Alpha, LPN sent at 2/37/6283  8:17 AM EST ----- Isabel Atkinson pt  ----- Message ----- From: Fay Records, MD Sent: 08/19/2022  10:44 PM EST To: [facility] Triage  LDL is 91, higher than it should be given CAD COuld she try Crestor daily  or 5 days per week? Repeat lipomed in 5 months

## 2022-08-24 LAB — COVID-19, FLU A+B AND RSV
Influenza A, NAA: NOT DETECTED
Influenza B, NAA: NOT DETECTED
RSV, NAA: NOT DETECTED
SARS-CoV-2, NAA: NOT DETECTED

## 2022-08-24 NOTE — Telephone Encounter (Signed)
I have completed this Please review this form It does have some administrative parts that need to be filled in Thank you for your help Please scan into the system so we will have it as a frame of reference for any future FMLA Thank you

## 2022-08-26 ENCOUNTER — Ambulatory Visit: Payer: BC Managed Care – PPO | Admitting: Family Medicine

## 2022-08-31 NOTE — Telephone Encounter (Signed)
Patient dropped back off form and said that the date was incorrect it should be 06/11/22 instead of 11/31/2023.   Dr. Nicki Reaper and I have updated the form and it has been faxed out and I have left msg for patient to call me back so I can advise her that this is now ready for pickup.

## 2022-09-01 ENCOUNTER — Encounter: Payer: Self-pay | Admitting: Family Medicine

## 2022-09-03 ENCOUNTER — Other Ambulatory Visit: Payer: Self-pay | Admitting: Family Medicine

## 2022-09-03 ENCOUNTER — Encounter: Payer: Self-pay | Admitting: Family Medicine

## 2022-09-03 MED ORDER — OXYCODONE-ACETAMINOPHEN 5-325 MG PO TABS: ORAL_TABLET | ORAL | 0 refills | Status: DC

## 2022-09-03 NOTE — Telephone Encounter (Signed)
A refill of the medicine was sent in A letter was dictated I would recommend for Isabel Atkinson to set up a follow-up visit toward the last week of February she has a work note through February 28

## 2022-09-03 NOTE — Progress Notes (Signed)
Please provide the patient a signed copy of this letter

## 2022-09-22 ENCOUNTER — Other Ambulatory Visit: Payer: Self-pay | Admitting: Family Medicine

## 2022-09-22 MED ORDER — OXYCODONE-ACETAMINOPHEN 5-325 MG PO TABS: ORAL_TABLET | ORAL | 0 refills | Status: DC

## 2022-09-22 NOTE — Telephone Encounter (Signed)
Nurses Prescription was sent in for a few more oxycodone to use as needed but at the same time use sparingly with the use.  Hopefully in the near future her situation will improve to where we can transition to a lower strength medicine or potentially off the medicine because in the long run it would not be healthy for her to be on this level of pain medicine.  Please keep follow-up visit toward the end of February as planned

## 2022-09-23 NOTE — Progress Notes (Signed)
[facility] Note  09/24/2022     CHIEF COMPLAINT Patient presents for Retina Follow Up  HISTORY OF PRESENT ILLNESS: Isabel Atkinson is a 57 y.o. female who presents to the clinic today for:   HPI     Retina Follow Up   Patient presents with  Other.  Severity is mild.  Duration of 6 months.  Since onset it is stable.  I, the attending physician,  performed the HPI with the patient and updated documentation appropriately.        Comments   6 month Retina eval for Retinal edema. Patient states vision is the same      Last edited by Bernarda Caffey, MD on 09/24/2022 12:14 PM.    Pt states vision is doing good, she is using Prolensa BID OD, she is still seeing Dr. Marisa Hua for her IOP  Referring physician: Baruch Goldmann, MD [id number] [place], [place]  [address]  HISTORICAL INFORMATION:   Selected notes from the MEDICAL RECORD NUMBER Referred by Dr. Marisa Hua LEE: 05.02.22 Ocular Hx- VMT OD, PCIOL OU, YAG OU, Ocular HTN, Steroid Responder BCVA: OD 20/80+, OS 20/20   CURRENT MEDICATIONS: Current Outpatient Medications (Ophthalmic Drugs)  Medication Sig   cycloSPORINE (RESTASIS) 0.05 % ophthalmic emulsion Place 1 drop into both eyes 2 (two) times daily.   Bromfenac Sodium (PROLENSA) 0.07 % SOLN Place 1 drop into the right eye daily.   No current facility-administered medications for this visit. (Ophthalmic Drugs)   Current Outpatient Medications (Other)  Medication Sig   acetaminophen (TYLENOL) 500 MG tablet Take 500 mg by mouth every 6 (six) hours as needed for moderate pain or headache.   albuterol (VENTOLIN HFA) 108 (90 Base) MCG/ACT inhaler Inhale 2 puffs into the lungs every 4 (four) hours as needed for wheezing or shortness of breath.   ALPRAZolam (XANAX) 0.25 MG tablet TAKE 1/2 TO 1 TABLET BY MOUTH TWICE DAILY AS NEEDED FOR ANXIETY. (Patient taking differently: Take 0.625 mg by mouth daily as needed for anxiety.)   aspirin EC  81 MG tablet Take 81 mg by mouth daily. Swallow whole.   CALCIUM PO Take 750 mg by mouth 2 (two) times daily with a meal.   cholecalciferol (VITAMIN D3) 25 MCG (1000 UNIT) tablet Take 1,000 Units by mouth in the morning and at bedtime.   denosumab (PROLIA) 60 MG/ML SOSY injection Inject 60 mg into the skin every 6 (six) months.   doxycycline (VIBRA-TABS) 100 MG tablet Take 1 tablet (100 mg total) by mouth 2 (two) times daily.   EPINEPHRINE 0.3 mg/0.3 mL IJ SOAJ injection INJECT INTO OUTER THIGH AND HOLD AGAINST LEG FOR 10 SECONDS FOR SEVERE ALLERGIC REACTION.   estradiol (ESTRACE) 0.1 MG/GM vaginal cream Apply a pea size amount of cream to urethral area of vagina twice weekly   HYDROcodone-acetaminophen (NORCO/VICODIN) 5-325 MG tablet TAKE 1 TABLET BY MOUTH TWICE DAILY AS NEEDED FOR MODERATE PAIN   Misc Natural Products (ELDERBERRY IMMUNE COMPLEX PO) Take 1 capsule by mouth daily.   Multiple Vitamins-Minerals (MULTIVITAMIN WITH MINERALS) tablet Take 1 tablet by mouth daily.    omeprazole (PRILOSEC) 40 MG capsule Take 1 capsule (40 mg total) by mouth daily.   ondansetron (ZOFRAN-ODT) 8 MG disintegrating tablet    OVER THE COUNTER MEDICATION Cranberry vitamin   oxyCODONE-acetaminophen (PERCOCET/ROXICET) 5-325 MG tablet 1 q4 hours prn pain use sparingly   rosuvastatin (CRESTOR) 10 MG tablet Take 1 tablet (10 mg total) by mouth  daily.   silver sulfADIAZINE (SILVADENE) 1 % cream Apply BID prn (Patient taking differently: Apply 1 application  topically 2 (two) times daily as needed (burns).)   Triamcinolone Acetonide (NASACORT ALLERGY 24HR NA) Place 2 sprays into the nose daily as needed (allergies).   Vaginal Lubricant (REPLENS) GEL Place 1 Application vaginally every 3 (three) days.   Vitamin D, Ergocalciferol, (DRISDOL) 1.25 MG (50000 UNIT) CAPS capsule Take 1 capsule (50,000 Units total) by mouth every 7 (seven) days.   No current facility-administered medications for this visit. (Other)   REVIEW  OF SYSTEMS: ROS   Positive for: Gastrointestinal, Cardiovascular, Eyes Negative for: Constitutional, Neurological, Skin, Genitourinary, Musculoskeletal, HENT, Endocrine, Respiratory, Psychiatric, Allergic/Imm, Heme/Lymph Last edited by Elmore Guise, COT on 09/24/2022  8:56 AM.     ALLERGIES Allergies  Allergen Reactions   Ciprofloxacin Other (See Comments)    Patient states with in five minutes of taking developed a splitting headache,itching all over and burning in mouth and lips.   Augmentin [Amoxicillin-Pot Clavulanate] Diarrhea   Carafate [Sucralfate]     Mouth tingling   Celexa [Citalopram Hydrobromide] Nausea And Vomiting   Chantix [Varenicline] Nausea And Vomiting   Sulfa Antibiotics Itching   Sulfur Itching   Wellbutrin [Bupropion]     Severe weight loss and loss of appetite   Fosamax [Alendronate Sodium] Nausea And Vomiting    GERD can not tolerate   PAST MEDICAL HISTORY Past Medical History:  Diagnosis Date   Anxiety    COPD (chronic obstructive pulmonary disease) (HCC)    GERD (gastroesophageal reflux disease)    Hypercholesterolemia    Pre-diabetes    Reactive airway disease    Tachycardia    Past Surgical History:  Procedure Laterality Date   CATARACT EXTRACTION W/PHACO Left 01/17/2017   Procedure: CATARACT EXTRACTION PHACO AND INTRAOCULAR LENS PLACEMENT ([place]);  Surgeon: Tonny Branch, MD;  Location: [facility];  Service: Ophthalmology;  Laterality: Left;  CDE: 6.92   CATARACT EXTRACTION W/PHACO Right 03/07/2020   Procedure: CATARACT EXTRACTION PHACO AND INTRAOCULAR LENS PLACEMENT RIGHT EYE;  Surgeon: Baruch Goldmann, MD;  Location: [facility];  Service: Ophthalmology;  Laterality: Right;  CDE: 8.44   COLONOSCOPY  2013   RMR: Normal rectum. single diminutive sigmoid polyp noted above. Normal terminal ileum. Status post segmental biopsy. next TCS in 10 years.    COLONOSCOPY WITH PROPOFOL N/A 01/27/2022   Procedure: COLONOSCOPY WITH PROPOFOL;  Surgeon: Daneil Dolin, MD;   Location: [facility];  Service: Endoscopy;  Laterality: N/A;  12:45pm   ELBOW SURGERY Right 2023   ESOPHAGOGASTRODUODENOSCOPY  2013   RMR: Possible cervical esophageal web-status post dilation as described above. Small hiatal hernia. Status post biopsy of normal appearing small bowel to screen for celiac disease.    ESOPHAGOGASTRODUODENOSCOPY N/A 05/06/2016   normal esophagus, small hiatal hernia, empiric dilation.   ESOPHAGOGASTRODUODENOSCOPY (EGD) WITH PROPOFOL N/A 01/27/2022   Procedure: ESOPHAGOGASTRODUODENOSCOPY (EGD) WITH PROPOFOL;  Surgeon: Daneil Dolin, MD;  [facility]: [facility];  Service: Endoscopy;  Laterality: N/A;   EYE SURGERY Bilateral    Cat Sx - Symfony MF IOLs   MALONEY DILATION N/A 05/06/2016   Procedure: MALONEY DILATION;  Surgeon: Daneil Dolin, MD;  [facility]: [facility];  Service: Endoscopy;  Laterality: N/A;   MALONEY DILATION N/A 01/27/2022   Procedure: Venia Minks DILATION;  Surgeon: Daneil Dolin, MD;  [facility]: [facility];  Service: Endoscopy;  Laterality: N/A;   NECK SURGERY  10/2012   OVARIAN CYST SURGERY  1997   removed   POLYPECTOMY  01/27/2022   Procedure: POLYPECTOMY;  Surgeon: Daneil Dolin, MD;  [facility]: [facility];  Service: Endoscopy;;  cecal   TUBAL LIGATION  [id number]   YAG LASER APPLICATION Bilateral    FAMILY HISTORY Family History  Problem Relation Age of Onset   Colon cancer Maternal Grandfather 20   Colon polyps Mother 23   Colon cancer Maternal Uncle 53   Hypertension Father    Diabetes Father    Heart disease Father    Hyperlipidemia Father    Stroke Father    SOCIAL HISTORY Social History   Tobacco Use   Smoking status: Every Day    Packs/day: 1.00    Years: 25.00    Total pack years: 25.00    Types: Cigarettes   Smokeless tobacco: Never  Vaping Use   Vaping Use: Never used  Substance Use Topics   Alcohol use: No    Alcohol/week: 0.0 standard drinks of alcohol   Drug use: No       OPHTHALMIC EXAM:  Base  Eye Exam     Visual Acuity (Snellen - Linear)       Right Left   Dist [place] 20/25- 20/20-2   Dist ph [place] 20/NI          Tonometry (Tonopen, 9:00 AM)       Right Left   Pressure 22 21         Pupils       Dark Light Shape React APD   Right 3 2 Round Brisk None   Left 3 2 Round Brisk None         Visual Fields (Counting fingers)       Left Right    Full Full         Extraocular Movement       Right Left    Full, Ortho Full, Ortho         Neuro/Psych     Oriented x3: Yes   Mood/Affect: Normal         Dilation     Both eyes: 1.0% Mydriacyl, 2.5% Phenylephrine @ 9:00 AM           Slit Lamp and Fundus Exam     Slit Lamp Exam       Right Left   Lids/Lashes Dermatochalasis - upper lid, mild MGD Dermatochalasis - upper lid, mild MGD   Conjunctiva/Sclera nasal and temporal pinguecula nasal and temporal pinguecula   Cornea well healed cataract wound well healed cataract wound   Anterior Chamber deep and clear, No cell or flare deep and clear, No cell or flare   Iris Round and dilated Round and dilated   Lens MF PC IOL in good position with open PC MF PC IOL in good position with open PC   Anterior Vitreous Vitreous syneresis Vitreous syneresis         Fundus Exam       Right Left   Disc Pink and Sharp Pink and Sharp   C/D Ratio 0.3 0.3   Macula Flat, Good foveal reflex, mild RPE mottling, central cyst -- resolved, +ERM, No heme or edema Flat, good foveal reflex, mild RPE mottling, No heme or edema   Vessels attenuated, Tortuous, mild copper wiring mild attenuation, mild tortuosity, mild copper wiring   Periphery Attached Attached           IMAGING AND PROCEDURES  Imaging and Procedures for 09/24/2022  OCT, Retina -  OU - Both Eyes       Right Eye Quality was good. Central Foveal Thickness: 284. Progression has improved. Findings include normal foveal contour, no IRF, no SRF, epiretinal membrane (Stable release of central VMT to partial  PVD, interval improvement in central macular cyst; mild ERM).   Left Eye Quality was good. Central Foveal Thickness: 289. Progression has been stable. Findings include normal foveal contour, no IRF, no SRF (Partial PVD).   Notes *Images captured and stored on drive  Diagnosis / Impression:  OD: Stable release of central VMT to partial PVD, interval improvement in central macular cyst; mild ERM OS: NFP, no IRF/SRF  Clinical management:  See below  Abbreviations: NFP - Normal foveal profile. CME - cystoid macular edema. PED - pigment epithelial detachment. IRF - intraretinal fluid. SRF - subretinal fluid. EZ - ellipsoid zone. ERM - epiretinal membrane. ORA - outer retinal atrophy. ORT - outer retinal tubulation. SRHM - subretinal hyper-reflective material. IRHM - intraretinal hyper-reflective material           ASSESSMENT/PLAN:   ICD-10-CM   1. Vitreomacular adhesion of right eye  H43.821     2. Epiretinal membrane (ERM) of right eye  H35.371 OCT, Retina - OU - Both Eyes    3. CME (cystoid macular edema), right  H35.351     4. Pseudophakia, both eyes  Z96.1     5. Hypertensive retinopathy of both eyes  H35.033      1-4. Central retinal edema OD  - central cystic changes -- likely multifactorial, but resolved today  - ERM and VMT were likely contributing -- VMT now stably released to partial PVD  - pt reports history of prolonged inflammation post cataract surgeries and YAGs OU -- [place]  - suspect portion of edema OD driven by CME post YAG cap OD (on 4.18.22)  - OCT shows Stable release of central VMT to partial PVD, interval improvement in central macular cyst   - BCVA 20/25 OD -- stable  - FA (07.08.22) shows mild, late perifoveal staining; ?mild hyperfluorescence of disc  - decrease Prolensa to qdaily OD -- may benefit from low dose maintenance therapy  - IOP today OD 22 -- on Simbrinza BID OD and Vyzulta QHS OD, managed by Dr. Marisa Hua  - pt is cleared from a retina  standpoint for release to Dr. Marisa Hua and resumption of primary eye care  5. Pseudophakia OU  - s/p CE/IOL OU (OD - Dr. Marisa Hua, 7.30.2021; OS - Dr. Geoffry Paradise, 6.11.2018)  - IOLs in good position, doing well  - s/p YAG OU (OD 4.18.22)  - monitor  Ophthalmic Meds Ordered this visit:  Meds ordered this encounter  Medications   Bromfenac Sodium (PROLENSA) 0.07 % SOLN    Sig: Place 1 drop into the right eye daily.    Dispense:  3 mL    Refill:  10     This document serves as a record of services personally performed by Gardiner Sleeper, MD, PhD. It was created on their behalf by Joetta Manners COT, an ophthalmic technician. The creation of this record is the provider's dictation and/or activities during the visit.    Electronically signed by: Joetta Manners COT 09/23/2022 12:15 PM  This document serves as a record of services personally performed by Gardiner Sleeper, MD, PhD. It was created on their behalf by San Jetty. Owens Shark, OA an ophthalmic technician. The creation of this record is the provider's dictation and/or activities during the visit.  Electronically signed by: San Jetty. Marguerita Merles 02.16.2024 12:15 PM  Gardiner Sleeper, M.D., Ph.D. Diseases & Surgery of the Retina and [place] 09/24/2022   I have reviewed the above documentation for accuracy and completeness, and I agree with the above. Gardiner Sleeper, M.D., Ph.D. 09/24/22 12:18 PM   Abbreviations: M myopia (nearsighted); A astigmatism; H hyperopia (farsighted); P presbyopia; Mrx spectacle prescription;  CTL contact lenses; OD right eye; OS left eye; OU both eyes  XT exotropia; ET esotropia; PEK punctate epithelial keratitis; PEE punctate epithelial erosions; DES dry eye syndrome; MGD meibomian gland dysfunction; ATs artificial tears; PFAT's preservative free artificial tears; [place] nuclear sclerotic cataract; PSC posterior subcapsular cataract; ERM epi-retinal membrane; PVD posterior  vitreous detachment; RD retinal detachment; DM diabetes mellitus; DR diabetic retinopathy; NPDR non-proliferative diabetic retinopathy; PDR proliferative diabetic retinopathy; CSME clinically significant macular edema; DME diabetic macular edema; dbh dot blot hemorrhages; CWS cotton wool spot; POAG primary open angle glaucoma; C/D cup-to-disc ratio; HVF humphrey visual field; GVF goldmann visual field; OCT optical coherence tomography; IOP intraocular pressure; BRVO Branch retinal vein occlusion; CRVO central retinal vein occlusion; CRAO central retinal artery occlusion; BRAO branch retinal artery occlusion; RT retinal tear; SB scleral buckle; PPV pars plana vitrectomy; VH Vitreous hemorrhage; PRP panretinal laser photocoagulation; IVK intravitreal kenalog; VMT vitreomacular traction; MH Macular hole;  NVD neovascularization of the disc; NVE neovascularization elsewhere; AREDS age related eye disease study; ARMD age related macular degeneration; POAG primary open angle glaucoma; EBMD epithelial/anterior basement membrane dystrophy; ACIOL anterior chamber intraocular lens; IOL intraocular lens; PCIOL posterior chamber intraocular lens; Phaco/IOL phacoemulsification with intraocular lens placement; [place] photorefractive keratectomy; LASIK laser assisted in situ keratomileusis; HTN hypertension; DM diabetes mellitus; COPD chronic obstructive pulmonary disease

## 2022-09-24 ENCOUNTER — Ambulatory Visit (INDEPENDENT_AMBULATORY_CARE_PROVIDER_SITE_OTHER): Payer: BC Managed Care – PPO | Admitting: Ophthalmology

## 2022-09-24 ENCOUNTER — Encounter (INDEPENDENT_AMBULATORY_CARE_PROVIDER_SITE_OTHER): Payer: BC Managed Care – PPO | Admitting: Ophthalmology

## 2022-09-24 ENCOUNTER — Encounter (INDEPENDENT_AMBULATORY_CARE_PROVIDER_SITE_OTHER): Payer: Self-pay | Admitting: Ophthalmology

## 2022-09-24 DIAGNOSIS — Z961 Presence of intraocular lens: Secondary | ICD-10-CM | POA: Diagnosis not present

## 2022-09-24 DIAGNOSIS — H35033 Hypertensive retinopathy, bilateral: Secondary | ICD-10-CM | POA: Diagnosis not present

## 2022-09-24 DIAGNOSIS — I10 Essential (primary) hypertension: Secondary | ICD-10-CM | POA: Diagnosis not present

## 2022-09-24 DIAGNOSIS — H43821 Vitreomacular adhesion, right eye: Secondary | ICD-10-CM | POA: Diagnosis not present

## 2022-09-24 DIAGNOSIS — H35351 Cystoid macular degeneration, right eye: Secondary | ICD-10-CM

## 2022-09-24 DIAGNOSIS — H35371 Puckering of macula, right eye: Secondary | ICD-10-CM

## 2022-09-24 MED ORDER — BROMFENAC SODIUM 0.07 % OP SOLN: 1.0000 [drp] | Freq: Every day | OPHTHALMIC | 10 refills | Status: AC

## 2022-09-30 ENCOUNTER — Ambulatory Visit
Admission: RE | Admit: 2022-09-30 | Discharge: 2022-09-30 | Disposition: A | Discharge status: Home / Self Care | Payer: BC Managed Care – PPO | Source: Ambulatory Visit | Attending: Nurse Practitioner | Admitting: Nurse Practitioner

## 2022-09-30 VITALS — BP 130/76 | HR 88 | Temp 97.2°F | Resp 18

## 2022-09-30 DIAGNOSIS — J069 Acute upper respiratory infection, unspecified: Secondary | ICD-10-CM | POA: Insufficient documentation

## 2022-09-30 DIAGNOSIS — Z1152 Encounter for screening for COVID-19: Secondary | ICD-10-CM | POA: Diagnosis present

## 2022-09-30 DIAGNOSIS — R300 Dysuria: Secondary | ICD-10-CM | POA: Diagnosis present

## 2022-09-30 LAB — POCT URINALYSIS DIP (MANUAL ENTRY)
Bilirubin, UA: NEGATIVE
Glucose, UA: NEGATIVE mg/dL
Ketones, POC UA: NEGATIVE mg/dL
Leukocytes, UA: NEGATIVE
Nitrite, UA: NEGATIVE
Protein Ur, POC: NEGATIVE mg/dL
Spec Grav, UA: >=1.03 — AB (ref 1.010–1.025)
Urobilinogen, UA: 0.2 E.U./dL
pH, UA: 5.5 (ref 5.0–8.0)

## 2022-09-30 MED ORDER — PHENAZOPYRIDINE HCL 100 MG PO TABS: 100.0000 mg | ORAL_TABLET | Freq: Three times a day (TID) | ORAL | 0 refills | Status: DC | PRN

## 2022-09-30 MED ORDER — CEPHALEXIN 500 MG PO CAPS: 500.0000 mg | ORAL_CAPSULE | Freq: Two times a day (BID) | ORAL | 0 refills | Status: AC

## 2022-09-30 NOTE — Discharge Instructions (Addendum)
As we discussed, the urine sample today shows large amount of blood.  I am concerned you have a urinary tract infection.  Please take the Keflex as prescribed while the urine culture is pending.  We will call you Friday or Monday if we need to change antibiotic therapy.  You can also start Pyridium in the meantime to help with bladder symptoms.  This will dye your urine bright orange.  The sinus pressure and cough is likely coming from a viral infection.  This should improve over the next week to 10 days.  If you develop chest pain or shortness of breath, go to the emergency room.  We have tested you today for COVID-19.  You will see the results in Mychart and we will call you with positive results.  Please stay home and isolate until you are aware of the results.    Some things that can make you feel better are: - Increased rest - Increasing fluid with water/sugar free electrolytes - Acetaminophen and ibuprofen as needed for fever/pain - Salt water gargling, chloraseptic spray and throat lozenges for sore throat - OTC guaifenesin (Mucinex) 600 mg twice daily for congestion - Saline sinus flushes or a neti pot - Humidifying the air

## 2022-09-30 NOTE — ED Triage Notes (Signed)
Pt reports pain when urinating x 3-4 days.   Reports cough and congestion x 2 days.

## 2022-09-30 NOTE — ED Provider Notes (Signed)
[facility]    CSN: [id number] Arrival date & time: 09/30/22  [id number]      History   Chief Complaint Chief Complaint  Patient presents with   Urinary Frequency    Test for utihurting - Entered by patient   Appointment    0900    HPI Isabel Atkinson is a 57 y.o. female.   Patient presents for 3-day history of burning with urination, increased urinary frequency and urgency, voiding smaller amounts, hematuria that is now resolved, suprapubic pressure.  She denies new urinary incontinence, new abdominal or back pain, flank pain, fever, bodyaches, chills, and nausea/vomiting.  No vaginal discharge.  Is currently sexually active, however urinates after sexual activity.  No concern for STDs today.  Reports a history of UTI, last one was in October 2023 and she had full improvement from it.  She took Keflex at that time which she tolerated well.  She is allergic to ciprofloxacin, Augmentin, and sulfa antibiotics.  Also concerned about 3-day history of congested cough, runny and stuffy nose, sinus pressure and headache, bilateral ear pressure, decreased appetite, taste decreased, and fatigue.  She denies shortness of breath or chest pain, congestion in her chest, wheezing, and sore throat.  As above, no gastrointestinal symptoms.  No known sick contacts.  Has been taking Mucinex max congestion with minimal improvement.  Reports that she took an at home COVID test on Tuesday that was negative.      Past Medical History:  Diagnosis Date   Anxiety    COPD (chronic obstructive pulmonary disease) (HCC)    GERD (gastroesophageal reflux disease)    Hypercholesterolemia    Pre-diabetes    Reactive airway disease    Tachycardia     Patient Active Problem List   Diagnosis Date Noted   Rectal pain 06/01/2022   Left sided abdominal pain 06/01/2022   Hematuria 11/17/2021   Encounter for screening colonoscopy 11/03/2021   Coronary artery disease involving native heart without angina  pectoris 02/26/2020   Aortic atherosclerosis ([place]) 02/26/2020   Current smoker 12/04/2019   Neurodermatitis 06/28/2016   COPD (chronic obstructive pulmonary disease) ([place]) 06/10/2016   Osteoporosis 07/20/2014   Irritable bowel syndrome 07/05/2014   Esophageal dysphagia 01/11/2012   GERD (gastroesophageal reflux disease) 01/11/2012   Chronic diarrhea 01/11/2012    Past Surgical History:  Procedure Laterality Date   CATARACT EXTRACTION W/PHACO Left 01/17/2017   Procedure: CATARACT EXTRACTION PHACO AND INTRAOCULAR LENS PLACEMENT ([place]);  Surgeon: Tonny Branch, MD;  Location: [facility];  Service: Ophthalmology;  Laterality: Left;  CDE: 6.92   CATARACT EXTRACTION W/PHACO Right 03/07/2020   Procedure: CATARACT EXTRACTION PHACO AND INTRAOCULAR LENS PLACEMENT RIGHT EYE;  Surgeon: Baruch Goldmann, MD;  Location: [facility];  Service: Ophthalmology;  Laterality: Right;  CDE: 8.44   COLONOSCOPY  2013   RMR: Normal rectum. single diminutive sigmoid polyp noted above. Normal terminal ileum. Status post segmental biopsy. next TCS in 10 years.    COLONOSCOPY WITH PROPOFOL N/A 01/27/2022   Procedure: COLONOSCOPY WITH PROPOFOL;  Surgeon: Daneil Dolin, MD;  Location: [facility];  Service: Endoscopy;  Laterality: N/A;  12:45pm   ELBOW SURGERY Right 2023   ESOPHAGOGASTRODUODENOSCOPY  2013   RMR: Possible cervical esophageal web-status post dilation as described above. Small hiatal hernia. Status post biopsy of normal appearing small bowel to screen for celiac disease.    ESOPHAGOGASTRODUODENOSCOPY N/A 05/06/2016   normal esophagus, small hiatal hernia, empiric dilation.   ESOPHAGOGASTRODUODENOSCOPY (EGD) WITH PROPOFOL N/A  01/27/2022   Procedure: ESOPHAGOGASTRODUODENOSCOPY (EGD) WITH PROPOFOL;  Surgeon: Daneil Dolin, MD;  [facility]: [facility];  Service: Endoscopy;  Laterality: N/A;   EYE SURGERY Bilateral    Cat Sx - Symfony MF IOLs   MALONEY DILATION N/A 05/06/2016   Procedure: MALONEY DILATION;   Surgeon: Daneil Dolin, MD;  [facility]: [facility];  Service: Endoscopy;  Laterality: N/A;   MALONEY DILATION N/A 01/27/2022   Procedure: Venia Minks DILATION;  Surgeon: Daneil Dolin, MD;  [facility]: [facility];  Service: Endoscopy;  Laterality: N/A;   NECK SURGERY  10/2012   OVARIAN CYST SURGERY  1997   removed   POLYPECTOMY  01/27/2022   Procedure: POLYPECTOMY;  Surgeon: Daneil Dolin, MD;  Location: [facility];  Service: Endoscopy;;  cecal   TUBAL LIGATION  [id number]   YAG LASER APPLICATION Bilateral     OB History     Gravida  3   Para  2   Term  2   Preterm      AB  1   Living  2      SAB      IAB      Ectopic      Multiple      Live Births               Home Medications    Prior to Admission medications   Medication Sig Start Date End Date Taking? Authorizing Provider  cephALEXin (KEFLEX) 500 MG capsule Take 1 capsule (500 mg total) by mouth 2 (two) times daily for 5 days. 09/30/22 10/05/22 Yes Eulogio Bear, NP  phenazopyridine (PYRIDIUM) 100 MG tablet Take 1 tablet (100 mg total) by mouth 3 (three) times daily as needed for pain. 09/30/22  Yes Eulogio Bear, NP  acetaminophen (TYLENOL) 500 MG tablet Take 500 mg by mouth every 6 (six) hours as needed for moderate pain or headache.    [provider]  albuterol (VENTOLIN HFA) 108 (90 Base) MCG/ACT inhaler Inhale 2 puffs into the lungs every 4 (four) hours as needed for wheezing or shortness of breath. 05/21/22   Kathyrn Drown, MD  ALPRAZolam (XANAX) 0.25 MG tablet TAKE 1/2 TO 1 TABLET BY MOUTH TWICE DAILY AS NEEDED FOR ANXIETY. Patient taking differently: Take 0.625 mg by mouth daily as needed for anxiety. 02/04/20   Kathyrn Drown, MD  aspirin EC 81 MG tablet Take 81 mg by mouth daily. Swallow whole.    [provider]  Bromfenac Sodium (PROLENSA) 0.07 % SOLN Place 1 drop into the right eye daily. 09/24/22   Bernarda Caffey, MD  CALCIUM PO Take 750 mg by mouth 2 (two) times  daily with a meal.    [provider]  cholecalciferol (VITAMIN D3) 25 MCG (1000 UNIT) tablet Take 1,000 Units by mouth in the morning and at bedtime.    [provider]  cycloSPORINE (RESTASIS) 0.05 % ophthalmic emulsion Place 1 drop into both eyes 2 (two) times daily.    [provider]  denosumab (PROLIA) 60 MG/ML SOSY injection Inject 60 mg into the skin every 6 (six) months. 04/29/22   Nida, Marella Chimes, MD  EPINEPHRINE 0.3 mg/0.3 mL IJ SOAJ injection INJECT INTO OUTER THIGH AND HOLD AGAINST LEG FOR 10 SECONDS FOR SEVERE ALLERGIC REACTION. 05/11/21   Kathyrn Drown, MD  estradiol (ESTRACE) 0.1 MG/GM vaginal cream Apply a pea size amount of cream to urethral area of vagina twice weekly 03/03/22  [place]lin, Berneice Heinrich, PA-C  HYDROcodone-acetaminophen (NORCO/VICODIN) 5-325 MG tablet TAKE 1 TABLET BY MOUTH TWICE DAILY AS NEEDED FOR MODERATE PAIN    [provider]  Misc Natural Products (ELDERBERRY IMMUNE COMPLEX PO) Take 1 capsule by mouth daily.    [provider]  Multiple Vitamins-Minerals (MULTIVITAMIN WITH MINERALS) tablet Take 1 tablet by mouth daily.     [provider]  omeprazole (PRILOSEC) 40 MG capsule Take 1 capsule (40 mg total) by mouth daily. 11/03/21   Annitta Needs, NP  ondansetron (ZOFRAN-ODT) 8 MG disintegrating tablet     [provider]  OVER THE COUNTER MEDICATION Cranberry vitamin    [provider]  oxyCODONE-acetaminophen (PERCOCET/ROXICET) 5-325 MG tablet 1 q4 hours prn pain use sparingly 09/22/22   Luking, Elayne Snare, MD  rosuvastatin (CRESTOR) 10 MG tablet Take 1 tablet (10 mg total) by mouth daily. 08/23/22 08/18/23  Fay Records, MD  silver sulfADIAZINE (SILVADENE) 1 % cream Apply BID prn Patient taking differently: Apply 1 application  topically 2 (two) times daily as needed (burns). 11/08/19   Kathyrn Drown, MD  Triamcinolone Acetonide (NASACORT ALLERGY 24HR NA) Place 2 sprays into the nose  daily as needed (allergies).    [provider]  Vaginal Lubricant (REPLENS) GEL Place 1 Application vaginally every 3 (three) days.    [provider]  Vitamin D, Ergocalciferol, (DRISDOL) 1.25 MG (50000 UNIT) CAPS capsule Take 1 capsule (50,000 Units total) by mouth every 7 (seven) days. 08/17/22   Fay Records, MD    Family History Family History  Problem Relation Age of Onset   Colon cancer Maternal Grandfather 51   Colon polyps Mother 100   Colon cancer Maternal Uncle 3   Hypertension Father    Diabetes Father    Heart disease Father    Hyperlipidemia Father    Stroke Father     Social History Social History   Tobacco Use   Smoking status: Every Day    Packs/day: 1.00    Years: 25.00    Total pack years: 25.00    Types: Cigarettes   Smokeless tobacco: Never  Vaping Use   Vaping Use: Never used  Substance Use Topics   Alcohol use: No    Alcohol/week: 0.0 standard drinks of alcohol   Drug use: No     Allergies   Ciprofloxacin, Augmentin [amoxicillin-pot clavulanate], Carafate [sucralfate], Celexa [citalopram hydrobromide], Chantix [varenicline], Sulfa antibiotics, Sulfur, Wellbutrin [bupropion], and Fosamax [alendronate sodium]   Review of Systems Review of Systems Per HPI  Physical Exam Triage Vital Signs ED Triage Vitals  Enc Vitals Group     BP 09/30/22 0926 130/76     Pulse Rate 09/30/22 0926 88     Resp 09/30/22 0926 18     Temp 09/30/22 0926 (!) 97.2 F (36.2 C)     Temp Source 09/30/22 0926 Oral     SpO2 09/30/22 0926 98 %     Weight --      Height --      Head Circumference --      Peak Flow --      Pain Score 09/30/22 0927 8     Pain Loc --      Pain Edu? --      Excl. in [place]? --    No data found.  Updated Vital Signs BP 130/76 (BP Location: Right Arm)   Pulse 88   Temp (!) 97.2 F (36.2 C) (Oral)   Resp 18  SpO2 98%   Visual Acuity Right Eye Distance:   Left Eye Distance:   Bilateral Distance:    Right Eye  Near:   Left Eye Near:    Bilateral Near:     Physical Exam Vitals and nursing note reviewed.  Constitutional:      General: She is not in acute distress.    Appearance: Normal appearance. She is not ill-appearing or toxic-appearing.  HENT:     Head: Normocephalic and atraumatic.     Right Ear: Tympanic membrane, ear canal and external ear normal.     Left Ear: Tympanic membrane, ear canal and external ear normal.     Nose: Congestion present. No rhinorrhea.     Right Sinus: Maxillary sinus tenderness present.     Left Sinus: Maxillary sinus tenderness present.     Mouth/Throat:     Mouth: Mucous membranes are moist.     Pharynx: Oropharynx is clear. No oropharyngeal exudate or posterior oropharyngeal erythema.  Eyes:     General: No scleral icterus.    Extraocular Movements: Extraocular movements intact.  Cardiovascular:     Rate and Rhythm: Normal rate and regular rhythm.  Pulmonary:     Effort: Pulmonary effort is normal. No respiratory distress.     Breath sounds: Normal breath sounds. No wheezing, rhonchi or rales.  Abdominal:     General: Abdomen is flat. Bowel sounds are normal. There is no distension.     Palpations: Abdomen is soft. There is no mass.     Tenderness: There is no abdominal tenderness. There is no right CVA tenderness, left CVA tenderness or guarding.  Musculoskeletal:     Cervical back: Normal range of motion and neck supple.  Lymphadenopathy:     Cervical: No cervical adenopathy.  Skin:    General: Skin is warm and dry.     Coloration: Skin is not jaundiced or pale.     Findings: No erythema or rash.  Neurological:     Mental Status: She is alert and oriented to person, place, and time.  Psychiatric:        Behavior: Behavior is cooperative.      UC Treatments / Results  Labs (all labs ordered are listed, but only abnormal results are displayed) Labs Reviewed  POCT URINALYSIS DIP (MANUAL ENTRY) - Abnormal; Notable for the following  components:      Result Value   Clarity, UA hazy (*)    Spec Grav, UA >=1.030 (*)    Blood, UA large (*)    All other components within normal limits  URINE CULTURE  SARS CORONAVIRUS 2 (TAT 6-24 HRS)    EKG   Radiology No results found.  Procedures Procedures (including critical care time)  Medications Ordered in UC Medications - No data to display  Initial Impression / Assessment and Plan / UC Course  I have reviewed the triage vital signs and the nursing notes.  Pertinent labs & imaging results that were available during my care of the patient were reviewed by me and considered in my medical decision making (see chart for details).   Patient is well-appearing, normotensive, afebrile, not tachycardic, not tachypneic, oxygenating well on room air.    1. Dysuria Urinalysis today is hazy, elevated specific gravity, and large amount of blood I am suspicious for UTI Will treat with Keflex twice daily for 5 days while urine culture is pending Start Pyridium to help with bladder pain ER and return precautions discussed with patient  2. Encounter  for screening for COVID-19 3. Viral URI with cough Suspect viral etiology COVID-19 testing obtained Patient is a candidate for molnupiravir if she test positive Supportive care discussed with patient ER and return precautions also discussed Note given  The patient was given the opportunity to ask questions.  All questions answered to their satisfaction.  The patient is in agreement to this plan.    Final Clinical Impressions(s) / UC Diagnoses   Final diagnoses:  Dysuria  Encounter for screening for COVID-19  Viral URI with cough     Discharge Instructions      As we discussed, the urine sample today shows large amount of blood.  I am concerned you have a urinary tract infection.  Please take the Keflex as prescribed while the urine culture is pending.  We will call you Friday or Monday if we need to change antibiotic  therapy.  You can also start Pyridium in the meantime to help with bladder symptoms.  This will dye your urine bright orange.  The sinus pressure and cough is likely coming from a viral infection.  This should improve over the next week to 10 days.  If you develop chest pain or shortness of breath, go to the emergency room.  We have tested you today for COVID-19.  You will see the results in Mychart and we will call you with positive results.  Please stay home and isolate until you are aware of the results.    Some things that can make you feel better are: - Increased rest - Increasing fluid with water/sugar free electrolytes - Acetaminophen and ibuprofen as needed for fever/pain - Salt water gargling, chloraseptic spray and throat lozenges for sore throat - OTC guaifenesin (Mucinex) 600 mg twice daily for congestion - Saline sinus flushes or a neti pot - Humidifying the air     ED Prescriptions     Medication Sig Dispense Auth. Provider   cephALEXin (KEFLEX) 500 MG capsule Take 1 capsule (500 mg total) by mouth 2 (two) times daily for 5 days. 10 capsule Noemi Chapel A, NP   phenazopyridine (PYRIDIUM) 100 MG tablet Take 1 tablet (100 mg total) by mouth 3 (three) times daily as needed for pain. 10 tablet Eulogio Bear, NP      PDMP not reviewed this encounter.   Eulogio Bear, NP 09/30/22 1041

## 2022-10-01 LAB — SARS CORONAVIRUS 2 (TAT 6-24 HRS): SARS Coronavirus 2: NEGATIVE

## 2022-10-02 LAB — URINE CULTURE: Culture: <10000 — AB

## 2022-10-04 ENCOUNTER — Ambulatory Visit: Payer: BC Managed Care – PPO | Admitting: Family Medicine

## 2022-10-04 VITALS — BP 153/81 | HR 88 | Wt 99.4 lb

## 2022-10-04 DIAGNOSIS — M5432 Sciatica, left side: Secondary | ICD-10-CM | POA: Diagnosis not present

## 2022-10-04 MED ORDER — CEFDINIR 300 MG PO CAPS: 300.0000 mg | ORAL_CAPSULE | Freq: Two times a day (BID) | ORAL | 0 refills | Status: DC

## 2022-10-04 MED ORDER — HYDROCODONE-ACETAMINOPHEN 10-325 MG PO TABS: ORAL_TABLET | ORAL | 0 refills | Status: DC

## 2022-10-04 MED ORDER — GABAPENTIN 100 MG PO CAPS: 100.0000 mg | ORAL_CAPSULE | Freq: Every day | ORAL | 1 refills | Status: DC

## 2022-10-04 NOTE — Progress Notes (Signed)
   Subjective:    Patient ID: Isabel Atkinson, female    DOB: 07-17-1966, 57 y.o.   MRN: [id number]  HPI Patient arrives today with back L5 And S1 lower back. Constant, dull pain. Patient states it is aggravated by standing or sitting for long periods. Patient with significant ongoing back pain discomfort radiates into the left hip and left groin as well as down the back and left leg sometimes down the back of the right leg bending over to pick up things causes increased pain unable to do any lifting.  Limited but unable to do rotating Her job requires lifting 25 to 50 pounds on a regular basis and requires squatting twisting turning bending she is unable to do these things She is currently out of work because of this She is seen [facility] that tried injection that did not help She had an MRI did not show any surgical findings but Dr. Saintclair Halsted neurosurgeon did do injection she has not seen any improvement with this.  She is unable to return to work currently she has been using oxycodone to help with pain in the evening time.  We did have a long discussion regarding reducing the strength of the pain medicine to see if that might help we also discussed trying gabapentin  Review of Systems     Objective:   Physical Exam  General-in no acute distress Eyes-no discharge Lungs-respiratory rate normal, CTA CV-no murmurs,RRR Extremities skin warm dry no edema Neuro grossly normal Behavior normal, alert Positive straight leg raise on the left negative on the right subjective low back pain and discomfort increased with rotation      Assessment & Plan:  Persistent low back pain Sciatica Unable to work Patient is to get a job description sent to [place] She is also to get the notes from the upcoming visit with Dr. Saintclair Halsted sent to [place] We will request the previous notes be sent to [place] Will try hydrocodone 10 mg as a stepdown from the oxycodone if this does not help well enough we will go back to  oxycodone to use sparingly Gabapentin we will start off at low-dose 100 mg in the evening she will give [place] feedback within 2 weeks how it is doing She is unable to do her job because she cannot do any squatting twisting bending on a persistent basis.  She is unable to sit for more than 20 minutes without having to stand and unable to stand for more than 20 minutes without having to sit or lay down because of this she is currently disabled from her job she is on FMLA we will write her a work note through the end of April and we will see her back in late April

## 2022-10-05 ENCOUNTER — Telehealth: Payer: Self-pay | Admitting: Family Medicine

## 2022-10-05 NOTE — Telephone Encounter (Signed)
Sounds good

## 2022-10-05 NOTE — Telephone Encounter (Signed)
Patient came by and said she spoke with Dr. Windy Carina office this morning and they are going to fax over the information you need.  CB# [phone]

## 2022-10-08 ENCOUNTER — Other Ambulatory Visit: Payer: Self-pay | Admitting: Nurse Practitioner

## 2022-10-08 MED ORDER — DOXYCYCLINE HYCLATE 100 MG PO CAPS: 100.0000 mg | ORAL_CAPSULE | Freq: Two times a day (BID) | ORAL | 0 refills | Status: DC

## 2022-10-08 NOTE — Telephone Encounter (Signed)
Phone call to patient. Having some diarrhea with Omnicef. Four stools earlier today. Requesting another antibiotic. No bloody stools or fever.  Will switch to Doxycyline. Start OTC probiotic such as Activia yogurt. Call back early next week if no improvement in sinus or diarrhea.

## 2022-10-10 ENCOUNTER — Telehealth: Payer: Self-pay | Admitting: Family Medicine

## 2022-10-10 NOTE — Telephone Encounter (Signed)
I received a form from her disability company regarding her job With her job she has to walk around 2.75 hours/day and stand 3.25 hours/day she has to lift more than 45 pounds frequently pushing pulling frequently.  Crawling and kneeling seldom repetitive motion frequently.

## 2022-10-13 ENCOUNTER — Ambulatory Visit (HOSPITAL_COMMUNITY)
Admission: RE | Admit: 2022-10-13 | Discharge: 2022-10-13 | Disposition: A | Discharge status: Home / Self Care | Payer: BC Managed Care – PPO | Source: Ambulatory Visit | Attending: "Endocrinology | Admitting: "Endocrinology

## 2022-10-13 DIAGNOSIS — M818 Other osteoporosis without current pathological fracture: Secondary | ICD-10-CM | POA: Diagnosis not present

## 2022-10-18 ENCOUNTER — Encounter: Payer: Self-pay | Admitting: Family Medicine

## 2022-10-20 ENCOUNTER — Encounter: Payer: Self-pay | Admitting: Family Medicine

## 2022-10-20 NOTE — Telephone Encounter (Signed)
This item has been addressed in husbands chart.

## 2022-10-21 ENCOUNTER — Other Ambulatory Visit (HOSPITAL_COMMUNITY): Payer: Self-pay

## 2022-10-22 ENCOUNTER — Other Ambulatory Visit: Payer: Self-pay | Admitting: Neurosurgery

## 2022-10-22 DIAGNOSIS — M5416 Radiculopathy, lumbar region: Secondary | ICD-10-CM

## 2022-10-25 ENCOUNTER — Other Ambulatory Visit (HOSPITAL_COMMUNITY): Payer: Self-pay

## 2022-10-26 ENCOUNTER — Other Ambulatory Visit: Payer: Self-pay | Admitting: Family Medicine

## 2022-10-27 ENCOUNTER — Other Ambulatory Visit: Payer: Self-pay | Admitting: Gastroenterology

## 2022-10-27 MED ORDER — HYDROCODONE-ACETAMINOPHEN 10-325 MG PO TABS: ORAL_TABLET | ORAL | 0 refills | Status: DC

## 2022-11-01 ENCOUNTER — Ambulatory Visit
Admission: RE | Admit: 2022-11-01 | Discharge: 2022-11-01 | Disposition: A | Discharge status: Home / Self Care | Payer: BC Managed Care – PPO | Source: Ambulatory Visit | Attending: Neurosurgery | Admitting: Neurosurgery

## 2022-11-01 DIAGNOSIS — M5416 Radiculopathy, lumbar region: Secondary | ICD-10-CM

## 2022-11-01 MED ORDER — MEPERIDINE HCL 50 MG/ML IJ SOLN: 50.0000 mg | Freq: Once | INTRAMUSCULAR | Status: DC | PRN

## 2022-11-01 MED ORDER — DIAZEPAM 5 MG PO TABS
10.0000 mg | ORAL_TABLET | Freq: Once | ORAL | Status: AC
  Administered 2022-11-01: 5 mg via ORAL

## 2022-11-01 MED ORDER — IOPAMIDOL (ISOVUE-M 200) INJECTION 41%
20.0000 mL | Freq: Once | INTRAMUSCULAR | Status: AC
  Administered 2022-11-01: 20 mL via INTRATHECAL

## 2022-11-01 MED ORDER — ONDANSETRON HCL 4 MG/2ML IJ SOLN: 4.0000 mg | Freq: Once | INTRAMUSCULAR | Status: DC | PRN

## 2022-11-01 NOTE — Discharge Instructions (Signed)

## 2022-11-12 ENCOUNTER — Other Ambulatory Visit: Payer: Self-pay | Admitting: Family Medicine

## 2022-11-13 ENCOUNTER — Other Ambulatory Visit: Payer: Self-pay | Admitting: Family Medicine

## 2022-11-15 ENCOUNTER — Telehealth: Payer: Self-pay | Admitting: Family Medicine

## 2022-11-15 ENCOUNTER — Other Ambulatory Visit: Payer: Self-pay | Admitting: Family Medicine

## 2022-11-15 MED ORDER — HYDROCODONE-ACETAMINOPHEN 10-325 MG PO TABS: ORAL_TABLET | ORAL | 0 refills | Status: DC

## 2022-11-15 NOTE — Telephone Encounter (Signed)
Refill on HYDROcodone-acetaminophen (NORCO) 10-325 MG tablet  completely out [employer]

## 2022-11-15 NOTE — Telephone Encounter (Signed)
Refill sent in as requested. 

## 2022-11-18 ENCOUNTER — Encounter: Payer: Self-pay | Admitting: Obstetrics & Gynecology

## 2022-11-18 ENCOUNTER — Encounter: Payer: Self-pay | Admitting: Family Medicine

## 2022-11-18 ENCOUNTER — Ambulatory Visit: Payer: BC Managed Care – PPO | Admitting: Obstetrics & Gynecology

## 2022-11-18 VITALS — BP 146/78 | HR 97 | Wt 100.0 lb

## 2022-11-18 DIAGNOSIS — R300 Dysuria: Secondary | ICD-10-CM | POA: Diagnosis not present

## 2022-11-18 DIAGNOSIS — N9489 Other specified conditions associated with female genital organs and menstrual cycle: Secondary | ICD-10-CM

## 2022-11-18 DIAGNOSIS — N814 Uterovaginal prolapse, unspecified: Secondary | ICD-10-CM

## 2022-11-18 LAB — POCT URINALYSIS DIPSTICK OB
Glucose, UA: NEGATIVE
Ketones, UA: NEGATIVE
Leukocytes, UA: NEGATIVE
Nitrite, UA: NEGATIVE
POC,PROTEIN,UA: NEGATIVE

## 2022-11-18 NOTE — Progress Notes (Signed)
Follow up appointment for results: Left pelvic pain  Chief Complaint  Patient presents with   discuss hysterectomy    Has been cleared by Dr Wynetta Emery    Blood pressure (!) 146/78, pulse 97, weight 100 lb (45.4 kg).  No results found.  DG MYELOGRAPHY LUMBAR INJ LUMBOSACRAL  Result Date: 11/01/2022 CLINICAL DATA:  Buttock and sacral region pain particularly with sitting. Some radiation to the left. EXAM: LUMBAR MYELOGRAM FLUOROSCOPY: 0 minutes 44 seconds.  129.00 micro gray meter squared PROCEDURE: After thorough discussion of risks and benefits of the procedure including bleeding, infection, injury to nerves, blood vessels, adjacent structures as well as headache and CSF leak, written and oral informed consent was obtained. Consent was obtained by Dr. Paulina Fusi. Time out form was completed. Patient was positioned prone on the fluoroscopy table. Local anesthesia was provided with 1% lidocaine without epinephrine after prepped and draped in the usual sterile fashion. Puncture was performed at L4-5 using a 3 1/2 inch 22-gauge spinal needle via right paramedian approach. Using a single pass through the dura, the needle was placed within the thecal sac, with return of clear CSF. 15 mL of Isovue M-200 was injected into the thecal sac, with normal opacification of the nerve roots and cauda equina consistent with free flow within the subarachnoid space. I personally performed the lumbar puncture and administered the intrathecal contrast. I also personally performed acquisition of the myelogram images. TECHNIQUE: Contiguous axial images were obtained through the Lumbar spine after the intrathecal infusion of infusion. Coronal and sagittal reconstructions were obtained of the axial image sets. COMPARISON:  MRI 07/30/2022 FINDINGS: LUMBAR MYELOGRAM FINDINGS: No central canal stenosis. No significant extradural defect. No evidence of focal neural compression. Standing flexion extension views do not show any  subluxation or abnormal rocking motion. CT LUMBAR MYELOGRAM FINDINGS: Mild scoliotic curvature convex to the left. No antero or retrolisthesis. No disc level abnormality from T11-12 through L2-3. L3-4: Very minimal disc bulge. No canal or foraminal stenosis. No facet arthropathy. L4-5: Minimal disc bulge. Minimal facet and ligamentous hypertrophy. No compressive narrowing of the canal or foramina. L5-S1: Minimal disc bulge. Minimal facet and ligamentous prominence. No compressive narrowing of the canal or foramina. Minimal bilateral sacroiliac degenerative changes. No other sacral finding. Atherosclerotic calcification of the aorta and its branch vessels incidentally noted. Nonobstructing 2 mm stone in the lower pole the right kidney incidentally noted. IMPRESSION: 1. Mild scoliotic curvature convex to the left. No antero or retrolisthesis. No abnormal rocking motion. 2. Minimal disc bulges at L3-4, L4-5 and L5-S1. No compressive narrowing of the canal or foramina. 3. Minimal bilateral sacroiliac degenerative changes. 4. Atherosclerotic calcification of the aorta and its branch vessels. 5. Nonobstructing 2 mm stone in the lower pole the right kidney. Aortic Atherosclerosis (ICD10-I70.0). Electronically Signed   By: Paulina Fusi M.D.   On: 11/01/2022 14:54   CT LUMBAR SPINE W CONTRAST  Result Date: 11/01/2022 CLINICAL DATA:  Buttock and sacral region pain particularly with sitting. Some radiation to the left. EXAM: LUMBAR MYELOGRAM FLUOROSCOPY: 0 minutes 44 seconds.  129.00 micro gray meter squared PROCEDURE: After thorough discussion of risks and benefits of the procedure including bleeding, infection, injury to nerves, blood vessels, adjacent structures as well as headache and CSF leak, written and oral informed consent was obtained. Consent was obtained by Dr. Paulina Fusi. Time out form was completed. Patient was positioned prone on the fluoroscopy table. Local anesthesia was provided with 1% lidocaine without  epinephrine after prepped and draped  in the usual sterile fashion. Puncture was performed at L4-5 using a 3 1/2 inch 22-gauge spinal needle via right paramedian approach. Using a single pass through the dura, the needle was placed within the thecal sac, with return of clear CSF. 15 mL of Isovue M-200 was injected into the thecal sac, with normal opacification of the nerve roots and cauda equina consistent with free flow within the subarachnoid space. I personally performed the lumbar puncture and administered the intrathecal contrast. I also personally performed acquisition of the myelogram images. TECHNIQUE: Contiguous axial images were obtained through the Lumbar spine after the intrathecal infusion of infusion. Coronal and sagittal reconstructions were obtained of the axial image sets. COMPARISON:  MRI 07/30/2022 FINDINGS: LUMBAR MYELOGRAM FINDINGS: No central canal stenosis. No significant extradural defect. No evidence of focal neural compression. Standing flexion extension views do not show any subluxation or abnormal rocking motion. CT LUMBAR MYELOGRAM FINDINGS: Mild scoliotic curvature convex to the left. No antero or retrolisthesis. No disc level abnormality from T11-12 through L2-3. L3-4: Very minimal disc bulge. No canal or foraminal stenosis. No facet arthropathy. L4-5: Minimal disc bulge. Minimal facet and ligamentous hypertrophy. No compressive narrowing of the canal or foramina. L5-S1: Minimal disc bulge. Minimal facet and ligamentous prominence. No compressive narrowing of the canal or foramina. Minimal bilateral sacroiliac degenerative changes. No other sacral finding. Atherosclerotic calcification of the aorta and its branch vessels incidentally noted. Nonobstructing 2 mm stone in the lower pole the right kidney incidentally noted. IMPRESSION: 1. Mild scoliotic curvature convex to the left. No antero or retrolisthesis. No abnormal rocking motion. 2. Minimal disc bulges at L3-4, L4-5 and L5-S1. No  compressive narrowing of the canal or foramina. 3. Minimal bilateral sacroiliac degenerative changes. 4. Atherosclerotic calcification of the aorta and its branch vessels. 5. Nonobstructing 2 mm stone in the lower pole the right kidney. Aortic Atherosclerosis (ICD10-I70.0). Electronically Signed   By: Paulina FusiMark  Shogry M.D.   On: 11/01/2022 14:54   DG Bone Density  Result Date: 10/13/2022 EXAM: DUAL X-RAY ABSORPTIOMETRY (DXA) FOR BONE MINERAL DENSITY IMPRESSION: Your patient Isabel Atkinson completed a BMD test on 10/13/2022 using the [employer] DXA System (software version: 14.10) manufactured by [employer] LUNAR. The following summarizes the results of our evaluation. Technologist:[facility] PATIENT BIOGRAPHICAL: Name: Isabel Atkinson, Isabel Atkinson Patient ID: [id number] Birth Date: 1966-07-13 Height: 62.0 in. Gender: Female Exam Date: 10/13/2022 Weight: 99.4 lbs. Indications: Caucasian, Early Menopause, Follow up Osteoporosis, Low Body Weight, Post Menopausal, Tobacco User Fractures: Toe Treatments: Asprin, Calcium, Multivitamin, Prolia, Vitamin D DENSITOMETRY RESULTS: Site      Region     Measured Date Measured Age WHO Classification Young Adult T-score BMD         %Change vs. Previous Significant Change (*) [facility] Spine L1-L4 (L2) 10/13/2020 58.4 Osteopenia -2.4 0.883 g/cm2 6.5% Yes AP Spine L1-L4 (L2) 10/08/2020 56.4 Osteoporosis -2.8 0.829 g/cm2 -9.5% Yes AP Spine L1-L4 (L2) 06/26/2015 51.1 Osteopenia -2.1 0.916 g/cm2 4.2% Yes AP Spine L1-L4 (L2) 05/18/2013 49.0 Osteopenia -2.4 0.879 g/cm2 - - DualFemur Neck Right 10/13/2022 58.4 Osteopenia -2.4 0.708 g/cm2 7.4% Yes DualFemur Neck Right 10/08/2020 56.4 Osteoporosis -2.7 0.659 g/cm2 -7.7% Yes DualFemur Neck Right 06/26/2015 51.1 Osteopenia -2.3 0.714 g/cm2 4.7% - DualFemur Neck Right 05/18/2013 49.0 Osteoporosis -2.6 0.682 g/cm2 - - DualFemur Total Mean 10/13/2022 58.4 Osteopenia -1.9 0.765 g/cm2 5.4% Yes DualFemur Total Mean 10/08/2020 56.4 Osteopenia -2.2 0.726 g/cm2  -9.5% Yes DualFemur Total Mean 06/26/2015 51.1 Osteopenia -1.6 0.802 g/cm2 3.9% Yes DualFemur Total  Mean 05/18/2013 49.0 Osteopenia -1.9 0.772 g/cm2 - - ASSESSMENT: The BMD measured at Femur Neck Right is 0.708 g/cm2 with a T-score of -2.4. This patient is considered osteopenic according to World Health Organization [place]) criteria. The scan quality is good. Compared with the prior study on 10/08/20 , the BMD of the total mean and ap spine show a statistically significant increase. L2 was excluded due to advanced degenerative changes. Patient is not a candidate for FRAX assessment due to being on Prolia. World Science writer [facility]) criteria for post-menopausal, Caucasian Women: Normal:       T-score at or above -1 SD Osteopenia:   T-score between -1 and -2.5 SD Osteoporosis: T-score at or below -2.5 SD RECOMMENDATIONS: 1. All patients should optimize calcium and vitamin D intake. 2. Consider FDA-approved medical therapies in postmenopausal women and med aged 84 years and older, based on the following: a. A hip or vertebral (clinical or morphometric) fracture b. T-score< -2.5 at the femoral neck or spine after appropriate evaluation to exclude secondary causes c. Low bone mass (T-score between -1.0 and -2.5 at the femoral neck or spine) and a 10-year probability of a hip fracture > 3% or a 10-year probability of a major osteoporosis-related fracture > 20% based on the [place]-adapted WHO algorithm d. Clinician judgment and/or patient preferences may indicate treatment for people with 10-year fracture probabilities above or below these levels FOLLOW-UP: Patients with diagnosis of osteoporosis or at high risk fracture should have regular bone mineral density tests. For patients eligible for Medicare, routine testing is allowed once every 2 years. Testing frequency can be increased to on year for patients who have rapidly progressing disease, those who are receiving medical therapy to restore bone mass, or have additional  risk factors. I have reviewed this report, and agree with the above findings. [facility] Electronically Signed   By: Bary Richard M.D.   On: 10/13/2022 13:33   OCT, Retina - OU - Both Eyes  Result Date: 09/24/2022 Right Eye Quality was good. Central Foveal Thickness: 284. Progression has improved. Findings include normal foveal contour, no IRF, no SRF, epiretinal membrane (Stable release of central VMT to partial PVD, interval improvement in central macular cyst; mild ERM). Left Eye Quality was good. Central Foveal Thickness: 289. Progression has been stable. Findings include normal foveal contour, no IRF, no SRF (Partial PVD). Notes *Images captured and stored on drive Diagnosis / Impression: OD: Stable release of central VMT to partial PVD, interval improvement in central macular cyst; mild ERM OS: NFP, no IRF/SRF Clinical management: See below Abbreviations: NFP - Normal foveal profile. CME - cystoid macular edema. PED - pigment epithelial detachment. IRF - intraretinal fluid. SRF - subretinal fluid. EZ - ellipsoid zone. ERM - epiretinal membrane. ORA - outer retinal atrophy. ORT - outer retinal tubulation. SRHM - subretinal hyper-reflective material. IRHM - intraretinal hyper-reflective material    MEDS ordered this encounter: No orders of the defined types were placed in this encounter.   Orders for this encounter: Orders Placed This Encounter  Procedures   Urine Culture   POC Urinalysis Dipstick OB    Impression + Management Plan   ICD-10-CM   1. Uterine prolapse, Grade 2  N81.4     2. Pelvic congestion syndrome  N94.89     3. Dysuria  R30.0 Urine Culture    POC Urinalysis Dipstick OB      Follow Up: Return in about 5 weeks (around 12/24/2022) for Post Op, with Dr Despina Hidden.  All questions were answered.  Past Medical History:  Diagnosis Date   Anxiety    COPD (chronic obstructive pulmonary disease)    GERD (gastroesophageal reflux disease)     Hypercholesterolemia    Pre-diabetes    Reactive airway disease    Tachycardia     Past Surgical History:  Procedure Laterality Date   CATARACT EXTRACTION W/PHACO Left 01/17/2017   Procedure: CATARACT EXTRACTION PHACO AND INTRAOCULAR LENS PLACEMENT (IOC);  Surgeon: Gemma Payor, MD;  Location: [facility];  Service: Ophthalmology;  Laterality: Left;  CDE: 6.92   CATARACT EXTRACTION W/PHACO Right 03/07/2020   Procedure: CATARACT EXTRACTION PHACO AND INTRAOCULAR LENS PLACEMENT RIGHT EYE;  Surgeon: Fabio Pierce, MD;  Location: [facility];  Service: Ophthalmology;  Laterality: Right;  CDE: 8.44   COLONOSCOPY  2013   RMR: Normal rectum. single diminutive sigmoid polyp noted above. Normal terminal ileum. Status post segmental biopsy. next TCS in 10 years.    COLONOSCOPY WITH PROPOFOL N/A 01/27/2022   Procedure: COLONOSCOPY WITH PROPOFOL;  Surgeon: Corbin Ade, MD;  Location: [facility];  Service: Endoscopy;  Laterality: N/A;  12:45pm   ELBOW SURGERY Right 2023   ESOPHAGOGASTRODUODENOSCOPY  2013   RMR: Possible cervical esophageal web-status post dilation as described above. Small hiatal hernia. Status post biopsy of normal appearing small bowel to screen for celiac disease.    ESOPHAGOGASTRODUODENOSCOPY N/A 05/06/2016   normal esophagus, small hiatal hernia, empiric dilation.   ESOPHAGOGASTRODUODENOSCOPY (EGD) WITH PROPOFOL N/A 01/27/2022   Procedure: ESOPHAGOGASTRODUODENOSCOPY (EGD) WITH PROPOFOL;  Surgeon: Corbin Ade, MD;  [facility];  Service: Endoscopy;  Laterality: N/A;   EYE SURGERY Bilateral    Cat Sx - Symfony MF IOLs   MALONEY DILATION N/A 05/06/2016   Procedure: MALONEY DILATION;  Surgeon: Corbin Ade, MD;  Location: [facility];  Service: Endoscopy;  Laterality: N/A;   MALONEY DILATION N/A 01/27/2022   Procedure: Elease Hashimoto DILATION;  Surgeon: Corbin Ade, MD;  Location: [facility];  Service: Endoscopy;  Laterality: N/A;   NECK SURGERY  10/2012   OVARIAN CYST  SURGERY  1997   removed   POLYPECTOMY  01/27/2022   Procedure: POLYPECTOMY;  Surgeon: Corbin Ade, MD;  Location: [facility];  Service: Endoscopy;;  cecal   TUBAL LIGATION  2000   YAG LASER APPLICATION Bilateral     OB History     Gravida  3   Para  2   Term  2   Preterm      AB  1   Living  2      SAB      IAB      Ectopic      Multiple      Live Births              Allergies  Allergen Reactions   Ciprofloxacin Other (See Comments)    Patient states with in five minutes of taking developed a splitting headache,itching all over and burning in mouth and lips.   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    Employer: [employer]  Tobacco Use   Smoking status: Every Day    Packs/day: 1.00    Years: 25.00    Additional pack years: 0.00    Total pack years: 25.00    Types: Cigarettes   Smokeless tobacco: Never  Vaping Use   Vaping Use: Never used  Substance and Sexual Activity   Alcohol use: No    Alcohol/week: 0.0 standard drinks of alcohol   Drug use: No   Sexual activity: Yes    Birth control/protection: Post-menopausal, Surgical  Other Topics Concern   Not on file  Social History Narrative   Lives at home w/ her husband   2 children - ages 21 & 77   Right-sided   Caffeine: 2 beverages per day   Social Determinants of Health   Financial Resource  Strain: Low Risk  (06/08/2022)   Overall Financial Resource Strain (CARDIA)    Difficulty of Paying Living Expenses: Not hard at all  Food Insecurity: No Food Insecurity (06/08/2022)   Hunger Vital Sign    Worried About Running Out of Food in the Last Year: Never true    Ran Out of Food in the Last Year: Never true  Transportation Needs: No Transportation Needs (06/08/2022)   PRAPARE - Administrator, Civil Service (Medical): No    Lack of Transportation (Non-Medical): No  Physical Activity: Insufficiently Active (06/08/2022)   Exercise Vital Sign    Days of Exercise per Week: 2 days    Minutes of Exercise per Session: 10 min  Stress: No Stress Concern Present (06/08/2022)   [employer] of Occupational Health - Occupational Stress Questionnaire    Feeling of Stress : Not at all  Social Connections: Moderately Integrated (06/08/2022)   Social Connection and Isolation Panel [NHANES]    Frequency of Communication with Friends and Family: More than three times a week    Frequency of Social Gatherings with Friends and Family: More than three times a week    Attends Religious Services: More than 4 times per year    Active Member of [employer] or Organizations: No    Attends Banker Meetings: Never    Marital Status: Married    Family History  Problem Relation Age of Onset   Colon cancer Maternal Grandfather 22   Colon polyps Mother 92   Colon cancer Maternal Uncle 61   Hypertension Father    Diabetes Father    Heart disease Father    Hyperlipidemia Father    Stroke Father

## 2022-11-20 LAB — URINE CULTURE

## 2022-11-23 ENCOUNTER — Telehealth: Payer: Self-pay

## 2022-11-23 ENCOUNTER — Encounter: Payer: Self-pay | Admitting: Obstetrics & Gynecology

## 2022-11-23 NOTE — Telephone Encounter (Signed)
PA request received via CMM for Prolia /ML syringes  PA submitted to [employer] and is pending determination  Key: [id number]

## 2022-11-24 NOTE — Telephone Encounter (Signed)
PA has been APPROVED from 11/23/2022-11/22/2023

## 2022-11-26 ENCOUNTER — Telehealth: Payer: BC Managed Care – PPO | Admitting: Physician Assistant

## 2022-11-26 DIAGNOSIS — B9689 Other specified bacterial agents as the cause of diseases classified elsewhere: Secondary | ICD-10-CM | POA: Diagnosis not present

## 2022-11-26 DIAGNOSIS — J019 Acute sinusitis, unspecified: Secondary | ICD-10-CM

## 2022-11-26 MED ORDER — DOXYCYCLINE HYCLATE 100 MG PO TABS: 100.0000 mg | ORAL_TABLET | Freq: Two times a day (BID) | ORAL | 0 refills | Status: DC

## 2022-11-26 NOTE — Progress Notes (Signed)
Virtual Visit Consent   Isabel Atkinson, you are scheduled for a virtual visit with a  provider today. Just as with appointments in the office, your consent must be obtained to participate. Your consent will be active for this visit and any virtual visit you may have with one of our providers in the next 365 days. If you have a MyChart account, a copy of this consent can be sent to you electronically.  As this is a virtual visit, video technology does not allow for your provider to perform a traditional examination. This may limit your provider's ability to fully assess your condition. If your provider identifies any concerns that need to be evaluated in person or the need to arrange testing (such as labs, EKG, etc.), we will make arrangements to do so. Although advances in technology are sophisticated, we cannot ensure that it will always work on either your end or our end. If the connection with a video visit is poor, the visit may have to be switched to a telephone visit. With either a video or telephone visit, we are not always able to ensure that we have a secure connection.  By engaging in this virtual visit, you consent to the provision of healthcare and authorize for your insurance to be billed (if applicable) for the services provided during this visit. Depending on your insurance coverage, you may receive a charge related to this service.  I need to obtain your verbal consent now. Are you willing to proceed with your visit today? Isabel Atkinson has provided verbal consent on 11/26/2022 for a virtual visit (video or telephone). Isabel Atkinson, [place]  Date: 11/26/2022 9:08 AM  Virtual Visit via Video Note   I, Isabel Atkinson, connected with  Isabel Atkinson  ([id number], [birth year]) on 11/26/22 at  9:00 AM EDT by a video-enabled telemedicine application and verified that I am speaking with the correct person using two identifiers.  Location: Patient: Virtual Visit  Location Patient: Home Provider: Virtual Visit Location Provider: Home Office   I discussed the limitations of evaluation and management by telemedicine and the availability of in person appointments. The patient expressed understanding and agreed to proceed.    History of Present Illness: Isabel Atkinson is a 57 y.o. who identifies as a female who was assigned female at birth, and is being seen today for a week or so of nasal and head congestion with sinus pressure and now sinus pain and tooth pain. Notes thick nasal discharge that has tuned colors. Denies chest pain or SOB. Denies ear pain.    HPI: HPI  Problems:  Patient Active Problem List   Diagnosis Date Noted   Rectal pain 06/01/2022   Left sided abdominal pain 06/01/2022   Hematuria 11/17/2021   Encounter for screening colonoscopy 11/03/2021   Coronary artery disease involving native heart without angina pectoris 02/26/2020   Aortic atherosclerosis 02/26/2020   Current smoker 12/04/2019   Neurodermatitis 06/28/2016   COPD (chronic obstructive pulmonary disease) 06/10/2016   Osteoporosis 07/20/2014   Irritable bowel syndrome 07/05/2014   Esophageal dysphagia 01/11/2012   GERD (gastroesophageal reflux disease) 01/11/2012   Chronic diarrhea 01/11/2012    Allergies:  Allergies  Allergen Reactions   Ciprofloxacin Other (See Comments)    Patient states with in five minutes of taking developed a splitting headache,itching all over and burning in mouth and lips.   Augmentin [Amoxicillin-Pot Clavulanate] Diarrhea   Carafate [Sucralfate]     Mouth tingling  Celexa [Citalopram Hydrobromide] Nausea And Vomiting   Chantix [Varenicline] Nausea And Vomiting   Sulfa Antibiotics Itching   Sulfur Itching   Wellbutrin [Bupropion]     Severe weight loss and loss of appetite   Fosamax [Alendronate Sodium] Nausea And Vomiting    GERD can not tolerate   Medications:  Current Outpatient Medications:    acetaminophen (TYLENOL) 500 MG  tablet, Take 500 mg by mouth every 6 (six) hours as needed for moderate pain or headache., Disp: , Rfl:    albuterol (VENTOLIN HFA) 108 (90 Base) MCG/ACT inhaler, Inhale 2 puffs into the lungs every 4 (four) hours as needed for wheezing or shortness of breath. (Patient not taking: Reported on 11/18/2022), Disp: 8 g, Rfl: 0   ALPRAZolam (XANAX) 0.25 MG tablet, TAKE 1/2 TO 1 TABLET BY MOUTH TWICE DAILY AS NEEDED FOR ANXIETY. (Patient not taking: Reported on 11/18/2022), Disp: 30 tablet, Rfl: 0   aspirin EC 81 MG tablet, Take 81 mg by mouth daily. Swallow whole., Disp: , Rfl:    Bromfenac Sodium (PROLENSA) 0.07 % SOLN, Place 1 drop into the right eye daily., Disp: 3 mL, Rfl: 10   CALCIUM PO, Take 750 mg by mouth 2 (two) times daily with a meal., Disp: , Rfl:    cholecalciferol (VITAMIN D3) 25 MCG (1000 UNIT) tablet, Take 1,000 Units by mouth in the morning and at bedtime., Disp: , Rfl:    cycloSPORINE (RESTASIS) 0.05 % ophthalmic emulsion, Place 1 drop into both eyes 2 (two) times daily., Disp: , Rfl:    denosumab (PROLIA) 60 MG/ML SOSY injection, Inject 60 mg into the skin every 6 (six) months., Disp: 1 mL, Rfl: 1   EPINEPHRINE 0.3 mg/0.3 mL IJ SOAJ injection, INJECT INTO OUTER THIGH AND HOLD AGAINST LEG FOR 10 SECONDS FOR SEVERE ALLERGIC REACTION. (Patient not taking: Reported on 11/18/2022), Disp: 2 each, Rfl: 3   estradiol (ESTRACE) 0.1 MG/GM vaginal cream, Apply a pea size amount of cream to urethral area of vagina twice weekly (Patient not taking: Reported on 11/18/2022), Disp: 42.5 g, Rfl: 3   HYDROcodone-acetaminophen (NORCO) 10-325 MG tablet, 1 bid prn, Disp: 24 tablet, Rfl: 0   Misc Natural Products (ELDERBERRY IMMUNE COMPLEX PO), Take 1 capsule by mouth daily., Disp: , Rfl:    Multiple Vitamins-Minerals (MULTIVITAMIN WITH MINERALS) tablet, Take 1 tablet by mouth daily. , Disp: , Rfl:    omeprazole (PRILOSEC) 40 MG capsule, Take 1 capsule (40 mg total) by mouth daily as needed., Disp: 90 capsule,  Rfl: 1   ondansetron (ZOFRAN-ODT) 8 MG disintegrating tablet, , Disp: , Rfl:    OVER THE COUNTER MEDICATION, Cranberry vitamin, Disp: , Rfl:    phenazopyridine (PYRIDIUM) 100 MG tablet, Take 1 tablet (100 mg total) by mouth 3 (three) times daily as needed for pain. (Patient not taking: Reported on 11/18/2022), Disp: 10 tablet, Rfl: 0   rosuvastatin (CRESTOR) 10 MG tablet, Take 1 tablet (10 mg total) by mouth daily., Disp: 90 tablet, Rfl: 3   Triamcinolone Acetonide (NASACORT ALLERGY 24HR NA), Place 2 sprays into the nose daily as needed (allergies)., Disp: , Rfl:    Vitamin D, Ergocalciferol, (DRISDOL) 1.25 MG (50000 UNIT) CAPS capsule, Take 1 capsule (50,000 Units total) by mouth every 7 (seven) days., Disp: 5 capsule, Rfl: 11  Observations/Objective: Patient is well-developed, well-nourished in no acute distress.  Resting comfortably  at home.  Head is normocephalic, atraumatic.  No labored breathing.  Speech is clear and coherent with logical content.  Patient  is alert and oriented at baseline.   Assessment and Plan: 1. Acute bacterial sinusitis  Rx Doxycycline.  Increase fluids.  Rest.  Saline nasal spray.  Probiotic.  Mucinex as directed.  Humidifier in bedroom. Continue allergy medications. Start Mucinex OTC.  Call or return to clinic if symptoms are not improving.   Follow Up Instructions: I discussed the assessment and treatment plan with the patient. The patient was provided an opportunity to ask questions and all were answered. The patient agreed with the plan and demonstrated an understanding of the instructions.  A copy of instructions were sent to the patient via MyChart unless otherwise noted below.   The patient was advised to call back or seek an in-person evaluation if the symptoms worsen or if the condition fails to improve as anticipated.  Time:  I spent 10 minutes with the patient via telehealth technology discussing the above problems/concerns.    Isabel Climes, PA-C

## 2022-11-26 NOTE — Patient Instructions (Signed)
Beecher Mcardle, thank you for joining [name], PA-C for today's virtual visit.  While this provider is not your primary care provider (PCP), if your PCP is located in our provider database this encounter information will be shared with them immediately following your visit.   A [facility] MyChart account gives you access to today's visit and all your visits, tests, and labs performed at [facility] " click here if you don't have a [facility] MyChart account or go to [email or web address]  Consent: (Patient) Isabel Atkinson provided verbal consent for this virtual visit at the beginning of the encounter.  Current Medications:  Current Outpatient Medications:    acetaminophen (TYLENOL) 500 MG tablet, Take 500 mg by mouth every 6 (six) hours as needed for moderate pain or headache., Disp: , Rfl:    albuterol (VENTOLIN HFA) 108 (90 Base) MCG/ACT inhaler, Inhale 2 puffs into the lungs every 4 (four) hours as needed for wheezing or shortness of breath. (Patient not taking: Reported on 11/18/2022), Disp: 8 g, Rfl: 0   ALPRAZolam (XANAX) 0.25 MG tablet, TAKE 1/2 TO 1 TABLET BY MOUTH TWICE DAILY AS NEEDED FOR ANXIETY. (Patient not taking: Reported on 11/18/2022), Disp: 30 tablet, Rfl: 0   aspirin EC 81 MG tablet, Take 81 mg by mouth daily. Swallow whole., Disp: , Rfl:    Bromfenac Sodium (PROLENSA) 0.07 % SOLN, Place 1 drop into the right eye daily., Disp: 3 mL, Rfl: 10   CALCIUM PO, Take 750 mg by mouth 2 (two) times daily with a meal., Disp: , Rfl:    cholecalciferol (VITAMIN D3) 25 MCG (1000 UNIT) tablet, Take 1,000 Units by mouth in the morning and at bedtime., Disp: , Rfl:    cycloSPORINE (RESTASIS) 0.05 % ophthalmic emulsion, Place 1 drop into both eyes 2 (two) times daily., Disp: , Rfl:    denosumab (PROLIA) 60 MG/ML SOSY injection, Inject 60 mg into the skin every 6 (six) months., Disp: 1 mL, Rfl: 1   EPINEPHRINE 0.3 mg/0.3 mL IJ SOAJ injection, INJECT INTO OUTER  THIGH AND HOLD AGAINST LEG FOR 10 SECONDS FOR SEVERE ALLERGIC REACTION. (Patient not taking: Reported on 11/18/2022), Disp: 2 each, Rfl: 3   estradiol (ESTRACE) 0.1 MG/GM vaginal cream, Apply a pea size amount of cream to urethral area of vagina twice weekly (Patient not taking: Reported on 11/18/2022), Disp: 42.5 g, Rfl: 3   HYDROcodone-acetaminophen (NORCO) 10-325 MG tablet, 1 bid prn, Disp: 24 tablet, Rfl: 0   Misc Natural Products (ELDERBERRY IMMUNE COMPLEX PO), Take 1 capsule by mouth daily., Disp: , Rfl:    Multiple Vitamins-Minerals (MULTIVITAMIN WITH MINERALS) tablet, Take 1 tablet by mouth daily. , Disp: , Rfl:    omeprazole (PRILOSEC) 40 MG capsule, Take 1 capsule (40 mg total) by mouth daily as needed., Disp: 90 capsule, Rfl: 1   ondansetron (ZOFRAN-ODT) 8 MG disintegrating tablet, , Disp: , Rfl:    OVER THE COUNTER MEDICATION, Cranberry vitamin, Disp: , Rfl:    phenazopyridine (PYRIDIUM) 100 MG tablet, Take 1 tablet (100 mg total) by mouth 3 (three) times daily as needed for pain. (Patient not taking: Reported on 11/18/2022), Disp: 10 tablet, Rfl: 0   rosuvastatin (CRESTOR) 10 MG tablet, Take 1 tablet (10 mg total) by mouth daily., Disp: 90 tablet, Rfl: 3   silver sulfADIAZINE (SILVADENE) 1 % cream, Apply BID prn (Patient not taking: Reported on 11/18/2022), Disp: 50 g, Rfl: 6   Triamcinolone Acetonide (NASACORT ALLERGY 24HR NA), Place 2 sprays into  the nose daily as needed (allergies)., Disp: , Rfl:    Vitamin D, Ergocalciferol, (DRISDOL) 1.25 MG (50000 UNIT) CAPS capsule, Take 1 capsule (50,000 Units total) by mouth every 7 (seven) days., Disp: 5 capsule, Rfl: 11   Medications ordered in this encounter:  No orders of the defined types were placed in this encounter.    *If you need refills on other medications prior to your next appointment, please contact your pharmacy*  Follow-Up: Call back or seek an in-person evaluation if the symptoms worsen or if the condition fails to improve as  anticipated.  [facility] [phone]  Other Instructions Please take antibiotic as directed.  Increase fluid intake.  Use Saline nasal spray.  Take a daily multivitamin. Continue allergy medications. Start Mucinex OTC.  Place a humidifier in the bedroom.  Please call or return clinic if symptoms are not improving.  Sinusitis Sinusitis is redness, soreness, and swelling (inflammation) of the paranasal sinuses. Paranasal sinuses are air pockets within the bones of your face (beneath the eyes, the middle of the forehead, or above the eyes). In healthy paranasal sinuses, mucus is able to drain out, and air is able to circulate through them by way of your nose. However, when your paranasal sinuses are inflamed, mucus and air can become trapped. This can allow bacteria and other germs to grow and cause infection. Sinusitis can develop quickly and last only a short time (acute) or continue over a long period (chronic). Sinusitis that lasts for more than 12 weeks is considered chronic.  CAUSES  Causes of sinusitis include: Allergies. Structural abnormalities, such as displacement of the cartilage that separates your nostrils (deviated septum), which can decrease the air flow through your nose and sinuses and affect sinus drainage. Functional abnormalities, such as when the small hairs (cilia) that line your sinuses and help remove mucus do not work properly or are not present. SYMPTOMS  Symptoms of acute and chronic sinusitis are the same. The primary symptoms are pain and pressure around the affected sinuses. Other symptoms include: Upper toothache. Earache. Headache. Bad breath. Decreased sense of smell and taste. A cough, which worsens when you are lying flat. Fatigue. Fever. Thick drainage from your nose, which often is green and may contain pus (purulent). Swelling and warmth over the affected sinuses. DIAGNOSIS  Your caregiver will perform a physical exam. During the exam,  your caregiver may: Look in your nose for signs of abnormal growths in your nostrils (nasal polyps). Tap over the affected sinus to check for signs of infection. View the inside of your sinuses (endoscopy) with a special imaging device with a light attached (endoscope), which is inserted into your sinuses. If your caregiver suspects that you have chronic sinusitis, one or more of the following tests may be recommended: Allergy tests. Nasal culture A sample of mucus is taken from your nose and sent to a lab and screened for bacteria. Nasal cytology A sample of mucus is taken from your nose and examined by your caregiver to determine if your sinusitis is related to an allergy. TREATMENT  Most cases of acute sinusitis are related to a viral infection and will resolve on their own within 10 days. Sometimes medicines are prescribed to help relieve symptoms (pain medicine, decongestants, nasal steroid sprays, or saline sprays).  However, for sinusitis related to a bacterial infection, your caregiver will prescribe antibiotic medicines. These are medicines that will help kill the bacteria causing the infection.  Rarely, sinusitis is caused by  a fungal infection. In theses cases, your caregiver will prescribe antifungal medicine. For some cases of chronic sinusitis, surgery is needed. Generally, these are cases in which sinusitis recurs more than 3 times per year, despite other treatments. HOME CARE INSTRUCTIONS  Drink plenty of water. Water helps thin the mucus so your sinuses can drain more easily. Use a humidifier. Inhale steam 3 to 4 times a day (for example, sit in the bathroom with the shower running). Apply a warm, moist washcloth to your face 3 to 4 times a day, or as directed by your caregiver. Use saline nasal sprays to help moisten and clean your sinuses. Take over-the-counter or prescription medicines for pain, discomfort, or fever only as directed by your caregiver. SEEK IMMEDIATE MEDICAL  CARE IF: You have increasing pain or severe headaches. You have nausea, vomiting, or drowsiness. You have swelling around your face. You have vision problems. You have a stiff neck. You have difficulty breathing. MAKE SURE YOU:  Understand these instructions. Will watch your condition. Will get help right away if you are not doing well or get worse. Document Released: 07/26/2005 Document Revised: 10/18/2011 Document Reviewed: 08/10/2011 [facility] Patient Information 2014 [place], [place].    If you have been instructed to have an in-person evaluation today at a local Urgent Care facility, please use the link below. It will take you to a list of all of our available [facility], including address, phone number and hours of operation. Please do not delay care.  [facility]  If you or a family member do not have a primary care provider, use the link below to schedule a visit and establish care. When you choose a [facility] primary care physician or advanced practice provider, you gain a long-term partner in health. Find a Primary Care Provider  Learn more about [facility] in-office and virtual care options: [facility] - Get Care Now

## 2022-11-27 LAB — COMPREHENSIVE METABOLIC PANEL
ALT: 9 IU/L (ref 0–32)
AST: 17 IU/L (ref 0–40)
Albumin/Globulin Ratio: 2.3 — ABNORMAL HIGH (ref 1.2–2.2)
Albumin: 4.5 g/dL (ref 3.8–4.9)
Alkaline Phosphatase: 65 IU/L (ref 44–121)
BUN/Creatinine Ratio: 25 — ABNORMAL HIGH (ref 9–23)
BUN: 13 mg/dL (ref 6–24)
Bilirubin Total: 0.3 mg/dL (ref 0.0–1.2)
CO2: 19 mmol/L — ABNORMAL LOW (ref 20–29)
Calcium: 9 mg/dL (ref 8.7–10.2)
Chloride: 108 mmol/L — ABNORMAL HIGH (ref 96–106)
Creatinine, Ser: 0.51 mg/dL — ABNORMAL LOW (ref 0.57–1.00)
Globulin, Total: 2 g/dL (ref 1.5–4.5)
Glucose: 86 mg/dL (ref 70–99)
Potassium: 4.1 mmol/L (ref 3.5–5.2)
Sodium: 144 mmol/L (ref 134–144)
Total Protein: 6.5 g/dL (ref 6.0–8.5)
eGFR: 108 mL/min/{1.73_m2} (ref >59)

## 2022-11-28 NOTE — Progress Notes (Signed)
Follow up appointment for recheck: Left hip pain  Chief Complaint  Patient presents with   Follow-up    Blood pressure 127/67, pulse 87, height 5\' 3"  (1.6 m).  I had ordered a MRI back/left hip as clinically her acute pain seemed to be related to her left hip, please see note from our previous visit for details Her pain is no better Reviewed that certainly uterine porlapse can cause pain but not the type she is having and not the intensity I have strongly recommended she get the scan done, I have had trouble with approval and have spent 30 minuted on the phone while patient was in my office with her benefits folks to get it done, no defintive answer Therwise she needs to see an orthopedist to get evaluated for possible left labrum tear  No new gyn symptoms, she does have Grade 2 uterine prolapse on exam and minimal left venous congestion pelvic veins on the left, should not be related to her acute degree of pain  MEDS ordered this encounter: No orders of the defined types were placed in this encounter.   Orders for this encounter: Orders Placed This Encounter  Procedures   Procedure Report - Scanned    Impression + Management Plan   ICD-10-CM   1. Left hip pain  M25.552     2. Cystocele with second degree uterine prolapse  N81.2     3. Pelvic congestion syndrome  N94.89     4. Dysuria  R30.0       Follow Up: Return if symptoms worsen or fail to improve.     All questions were answered.  Past Medical History:  Diagnosis Date   Anxiety    COPD (chronic obstructive pulmonary disease)    GERD (gastroesophageal reflux disease)    Hypercholesterolemia    Pre-diabetes    Reactive airway disease    Tachycardia     Past Surgical History:  Procedure Laterality Date   CATARACT EXTRACTION W/PHACO Left 01/17/2017   Procedure: CATARACT EXTRACTION PHACO AND INTRAOCULAR LENS PLACEMENT (IOC);  Surgeon: Gemma Payor, MD;  Location: [facility];  Service: Ophthalmology;   Laterality: Left;  CDE: 6.92   CATARACT EXTRACTION W/PHACO Right 03/07/2020   Procedure: CATARACT EXTRACTION PHACO AND INTRAOCULAR LENS PLACEMENT RIGHT EYE;  Surgeon: Fabio Pierce, MD;  Location: [facility];  Service: Ophthalmology;  Laterality: Right;  CDE: 8.44   COLONOSCOPY  2013   RMR: Normal rectum. single diminutive sigmoid polyp noted above. Normal terminal ileum. Status post segmental biopsy. next TCS in 10 years.    COLONOSCOPY WITH PROPOFOL N/A 01/27/2022   Procedure: COLONOSCOPY WITH PROPOFOL;  Surgeon: Corbin Ade, MD;  Location: [facility];  Service: Endoscopy;  Laterality: N/A;  12:45pm   ELBOW SURGERY Right 2023   ESOPHAGOGASTRODUODENOSCOPY  2013   RMR: Possible cervical esophageal web-status post dilation as described above. Small hiatal hernia. Status post biopsy of normal appearing small bowel to screen for celiac disease.    ESOPHAGOGASTRODUODENOSCOPY N/A 05/06/2016   normal esophagus, small hiatal hernia, empiric dilation.   ESOPHAGOGASTRODUODENOSCOPY (EGD) WITH PROPOFOL N/A 01/27/2022   Procedure: ESOPHAGOGASTRODUODENOSCOPY (EGD) WITH PROPOFOL;  Surgeon: Corbin Ade, MD;  [facility];  Service: Endoscopy;  Laterality: N/A;   EYE SURGERY Bilateral    Cat Sx - Symfony MF IOLs   MALONEY DILATION N/A 05/06/2016   Procedure: MALONEY DILATION;  Surgeon: Corbin Ade, MD;  Location: [facility];  Service: Endoscopy;  Laterality: N/A;  MALONEY DILATION N/A 01/27/2022   Procedure: Elease Hashimoto DILATION;  Surgeon: Corbin Ade, MD;  Location: [facility];  Service: Endoscopy;  Laterality: N/A;   NECK SURGERY  10/2012   OVARIAN CYST SURGERY  1997   removed   POLYPECTOMY  01/27/2022   Procedure: POLYPECTOMY;  Surgeon: Corbin Ade, MD;  Location: [facility];  Service: Endoscopy;;  cecal   TUBAL LIGATION  2000   YAG LASER APPLICATION Bilateral     OB History     Gravida  3   Para  2   Term  2   Preterm      AB  1   Living  2      SAB       IAB      Ectopic      Multiple      Live Births              Allergies  Allergen Reactions   Ciprofloxacin Other (See Comments)    Patient states with in five minutes of taking developed a splitting headache,itching all over and burning in mouth and lips.   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    Employer: [employer]  Tobacco Use   Smoking status: Every Day    Packs/day: 1.00    Years: 25.00    Additional pack years: 0.00    Total pack years: 25.00    Types: Cigarettes   Smokeless tobacco: Never  Vaping Use   Vaping Use: Never used  Substance and Sexual Activity   Alcohol use: No    Alcohol/week: 0.0 standard drinks of alcohol   Drug use: No   Sexual activity: Yes    Birth control/protection: Post-menopausal, Surgical  Other Topics Concern   Not on file  Social History Narrative   Lives at home w/ her husband   2 children - ages 37 & 34   Right-sided   Caffeine: 2 beverages per day   Social Determinants of Health   Financial Resource Strain: Low Risk  (06/08/2022)   Overall Financial Resource Strain (CARDIA)    Difficulty of Paying Living Expenses: Not hard at all  Food Insecurity: No Food Insecurity (06/08/2022)   Hunger Vital Sign    Worried About Running Out of Food in the Last Year: Never true    Ran Out of Food in the Last Year: Never true  Transportation  Needs: No Transportation Needs (06/08/2022)   PRAPARE - Administrator, Civil Service (Medical): No    Lack of Transportation (Non-Medical): No  Physical Activity: Insufficiently Active (06/08/2022)   Exercise Vital Sign    Days of Exercise per Week: 2 days    Minutes of Exercise per Session: 10 min  Stress: No Stress Concern Present (06/08/2022)   [employer] of Occupational Health -  Occupational Stress Questionnaire    Feeling of Stress : Not at all  Social Connections: Moderately Integrated (06/08/2022)   Social Connection and Isolation Panel [NHANES]    Frequency of Communication with Friends and Family: More than three times a week    Frequency of Social Gatherings with Friends and Family: More than three times a week    Attends Religious Services: More than 4 times per year    Active Member of [employer] or Organizations: No    Attends Banker Meetings: Never    Marital Status: Married    Family History  Problem Relation Age of Onset   Colon cancer Maternal Grandfather 41   Colon polyps Mother 75   Colon cancer Maternal Uncle 53   Hypertension Father    Diabetes Father    Heart disease Father    Hyperlipidemia Father    Stroke Father

## 2022-11-29 ENCOUNTER — Other Ambulatory Visit: Payer: Self-pay

## 2022-11-29 ENCOUNTER — Telehealth: Payer: Self-pay

## 2022-11-29 DIAGNOSIS — M818 Other osteoporosis without current pathological fracture: Secondary | ICD-10-CM

## 2022-11-29 MED ORDER — DENOSUMAB 60 MG/ML ~~LOC~~ SOSY: 60.0000 mg | PREFILLED_SYRINGE | SUBCUTANEOUS | 1 refills | Status: DC

## 2022-11-29 NOTE — Telephone Encounter (Signed)
Left a message requesting pt return call to the office. 

## 2022-11-30 ENCOUNTER — Encounter: Payer: Self-pay | Admitting: "Endocrinology

## 2022-11-30 ENCOUNTER — Ambulatory Visit: Payer: BC Managed Care – PPO | Admitting: "Endocrinology

## 2022-11-30 VITALS — BP 122/78 | HR 84 | Ht 62.0 in | Wt 97.4 lb

## 2022-11-30 DIAGNOSIS — M818 Other osteoporosis without current pathological fracture: Secondary | ICD-10-CM | POA: Diagnosis not present

## 2022-11-30 NOTE — Progress Notes (Signed)
11/30/2022        Endocrinology follow-up note  Past Medical History:  Diagnosis Date   Anxiety    COPD (chronic obstructive pulmonary disease)    GERD (gastroesophageal reflux disease)    Hypercholesterolemia    Pre-diabetes    Reactive airway disease    Tachycardia    Past Surgical History:  Procedure Laterality Date   CATARACT EXTRACTION W/PHACO Left 01/17/2017   Procedure: CATARACT EXTRACTION PHACO AND INTRAOCULAR LENS PLACEMENT (IOC);  Surgeon: Gemma Payor, MD;  Location: [facility];  Service: Ophthalmology;  Laterality: Left;  CDE: 6.92   CATARACT EXTRACTION W/PHACO Right 03/07/2020   Procedure: CATARACT EXTRACTION PHACO AND INTRAOCULAR LENS PLACEMENT RIGHT EYE;  Surgeon: Fabio Pierce, MD;  Location: [facility];  Service: Ophthalmology;  Laterality: Right;  CDE: 8.44   COLONOSCOPY  2013   RMR: Normal rectum. single diminutive sigmoid polyp noted above. Normal terminal ileum. Status post segmental biopsy. next TCS in 10 years.    COLONOSCOPY WITH PROPOFOL N/A 01/27/2022   Procedure: COLONOSCOPY WITH PROPOFOL;  Surgeon: Corbin Ade, MD;  Location: [facility];  Service: Endoscopy;  Laterality: N/A;  12:45pm   ELBOW SURGERY Right 2023   ESOPHAGOGASTRODUODENOSCOPY  2013   RMR: Possible cervical esophageal web-status post dilation as described above. Small hiatal hernia. Status post biopsy of normal appearing small bowel to screen for celiac disease.    ESOPHAGOGASTRODUODENOSCOPY N/A 05/06/2016   normal esophagus, small hiatal hernia, empiric dilation.   ESOPHAGOGASTRODUODENOSCOPY (EGD) WITH PROPOFOL N/A 01/27/2022   Procedure: ESOPHAGOGASTRODUODENOSCOPY (EGD) WITH PROPOFOL;  Surgeon: Corbin Ade, MD;  [facility]: [facility];  Service: Endoscopy;  Laterality: N/A;   EYE SURGERY Bilateral    Cat Sx - Symfony MF IOLs   MALONEY DILATION N/A 05/06/2016   Procedure: MALONEY DILATION;  Surgeon: Corbin Ade, MD;   [facility]: [facility];  Service: Endoscopy;  Laterality: N/A;   MALONEY DILATION N/A 01/27/2022   Procedure: Elease Hashimoto DILATION;  Surgeon: Corbin Ade, MD;  Location: [facility];  Service: Endoscopy;  Laterality: N/A;   NECK SURGERY  10/2012   OVARIAN CYST SURGERY  1997   removed   POLYPECTOMY  01/27/2022   Procedure: POLYPECTOMY;  Surgeon: Corbin Ade, MD;  Location: [facility];  Service: Endoscopy;;  cecal   TUBAL LIGATION  2000   YAG LASER APPLICATION Bilateral    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    Employer: [employer]  Tobacco Use   Smoking status: Every Day    Packs/day: 1.00    Years: 25.00    Additional pack years: 0.00    Total pack years: 25.00    Types: Cigarettes   Smokeless tobacco: Never  Vaping Use   Vaping Use: Never used  Substance and Sexual Activity   Alcohol use: No    Alcohol/week: 0.0 standard drinks of alcohol   Drug use: No  Sexual activity: Yes    Birth control/protection: Post-menopausal, Surgical  Other Topics Concern   Not on file  Social History Narrative   Lives at home w/ her husband   2 children - ages 40 & 4   Right-sided   Caffeine: 2 beverages per day   Social Determinants of Health   Financial Resource Strain: Low Risk  (06/08/2022)   Overall Financial Resource Strain (CARDIA)    Difficulty of Paying Living Expenses: Not hard at all  Food Insecurity: No Food Insecurity (06/08/2022)   Hunger Vital Sign    Worried About Running Out of Food in the Last Year: Never true    Ran Out of Food in the Last Year: Never true  Transportation Needs: No Transportation Needs (06/08/2022)   PRAPARE - Administrator, Civil Service (Medical): No    Lack of Transportation (Non-Medical): No  Physical Activity:  Insufficiently Active (06/08/2022)   Exercise Vital Sign    Days of Exercise per Week: 2 days    Minutes of Exercise per Session: 10 min  Stress: No Stress Concern Present (06/08/2022)   [employer] of Occupational Health - Occupational Stress Questionnaire    Feeling of Stress : Not at all  Social Connections: Moderately Integrated (06/08/2022)   Social Connection and Isolation Panel [NHANES]    Frequency of Communication with Friends and Family: More than three times a week    Frequency of Social Gatherings with Friends and Family: More than three times a week    Attends Religious Services: More than 4 times per year    Active Member of [employer] or Organizations: No    Attends Banker Meetings: Never    Marital Status: Married   Outpatient Encounter Medications as of 11/30/2022  Medication Sig   acetaminophen (TYLENOL) 500 MG tablet Take 500 mg by mouth every 6 (six) hours as needed for moderate pain or headache.   albuterol (VENTOLIN HFA) 108 (90 Base) MCG/ACT inhaler Inhale 2 puffs into the lungs every 4 (four) hours as needed for wheezing or shortness of breath. (Patient not taking: Reported on 11/18/2022)   ALPRAZolam (XANAX) 0.25 MG tablet TAKE 1/2 TO 1 TABLET BY MOUTH TWICE DAILY AS NEEDED FOR ANXIETY. (Patient not taking: Reported on 11/18/2022)   aspirin EC 81 MG tablet Take 81 mg by mouth daily. Swallow whole.   Bromfenac Sodium (PROLENSA) 0.07 % SOLN Place 1 drop into the right eye daily.   CALCIUM PO Take 750 mg by mouth 2 (two) times daily with a meal.   cholecalciferol (VITAMIN D3) 25 MCG (1000 UNIT) tablet Take 1,000 Units by mouth in the morning and at bedtime.   cycloSPORINE (RESTASIS) 0.05 % ophthalmic emulsion Place 1 drop into both eyes 2 (two) times daily.   denosumab (PROLIA) 60 MG/ML SOSY injection Inject 60 mg into the skin every 6 (six) months.   doxycycline (VIBRA-TABS) 100 MG tablet Take 1 tablet (100 mg total) by mouth 2 (two) times daily.    EPINEPHRINE 0.3 mg/0.3 mL IJ SOAJ injection INJECT INTO OUTER THIGH AND HOLD AGAINST LEG FOR 10 SECONDS FOR SEVERE ALLERGIC REACTION. (Patient not taking: Reported on 11/18/2022)   estradiol (ESTRACE) 0.1 MG/GM vaginal cream Apply a pea size amount of cream to urethral area of vagina twice weekly (Patient not taking: Reported on 11/18/2022)   HYDROcodone-acetaminophen (NORCO) 10-325 MG tablet 1 bid prn   Misc Natural Products (ELDERBERRY IMMUNE COMPLEX PO) Take 1 capsule by mouth daily.   Multiple  Vitamins-Minerals (MULTIVITAMIN WITH MINERALS) tablet Take 1 tablet by mouth daily.    omeprazole (PRILOSEC) 40 MG capsule Take 1 capsule (40 mg total) by mouth daily as needed.   ondansetron (ZOFRAN-ODT) 8 MG disintegrating tablet    OVER THE COUNTER MEDICATION Cranberry vitamin   phenazopyridine (PYRIDIUM) 100 MG tablet Take 1 tablet (100 mg total) by mouth 3 (three) times daily as needed for pain. (Patient not taking: Reported on 11/18/2022)   rosuvastatin (CRESTOR) 10 MG tablet Take 1 tablet (10 mg total) by mouth daily.   Triamcinolone Acetonide (NASACORT ALLERGY 24HR NA) Place 2 sprays into the nose daily as needed (allergies).   Vitamin D, Ergocalciferol, (DRISDOL) 1.25 MG (50000 UNIT) CAPS capsule Take 1 capsule (50,000 Units total) by mouth every 7 (seven) days.   No facility-administered encounter medications on file as of 11/30/2022.   ALLERGIES: Allergies  Allergen Reactions   Ciprofloxacin Other (See Comments)    Patient states with in five minutes of taking developed a splitting headache,itching all over and burning in mouth and lips.   Augmentin [Amoxicillin-Pot Clavulanate] Diarrhea   Carafate [Sucralfate]     Mouth tingling   Celexa [Citalopram Hydrobromide] Nausea And Vomiting   Chantix [Varenicline] Nausea And Vomiting   Sulfa Antibiotics Itching   Sulfur Itching   Wellbutrin [Bupropion]     Severe weight loss and loss of appetite   Fosamax [Alendronate Sodium] Nausea And  Vomiting    GERD can not tolerate    VACCINATION STATUS: Immunization History  Administered Date(s) Administered   Influenza,inj,Quad PF,6+ Mos 09/14/2017, 07/04/2018, 04/25/2019, 07/14/2020, 07/16/2022   PFIZER(Purple Top)SARS-COV-2 Vaccination 10/14/2019, 11/04/2019   Pneumococcal Polysaccharide-23 04/25/2019   Tdap 01/19/2018   Zoster Recombinat (Shingrix) 03/04/2022    HPI   Isabel Atkinson is 57 y.o. female who presents today with a medical history as above. she is being seen in follow-up after she was seen in consultation for osteoporosis requested by Babs Sciara, MD. She is status post 3 treatments of Prolia subcutaneous injection and she is here for her next Prolia injection.  No new complaints today.  She has no interval falls nor fractures.   Her previsit DEXA scan shows improvement in her osteoporosis. Patient was diagnosed with osteoporosis  approximately  9 years ago. No dizziness/vertigo/orthostasis. She was treated with Fosamax which she did not tolerate.  She was also treated with what appears to be Reclast infusion also did not tolerate prior to 5 years ago. She has history of vitamin D deficiency currently on vitamin D and calcium supplements.  She also eats dairy and green, leafy, vegetables.   No weight bearing exercises.  She denies loss of height. She has prior exposure to on and off steroids due to COPD/reactive airway disease, however denies chronic exposure to heavy dose steroids. She denies any prior history of parathyroid, thyroid dysfunctions.  No history of nephrolithiasis.  Her most recent calcium level is 9.6.  No h/o CKD. Last BUN/Cr: 15/0.56  Menopause was at 57 y/o.   Pt does not have a FH of osteoporosis.  She has dental implants, however no scheduled dental procedure in the next near future.   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  She remains an active smoker.  She also has  history of irritable bowel syndrome, prediabetes.  Review of Systems  Constitutional: + No recent major weight change, she has always been light build,  + fatigue, no  subjective hyperthermia, no subjective hypothermia   Objective:    BP 122/78   Pulse 84   Ht 5\' 2"  (1.575 m)   Wt 97 lb 6.4 oz (44.2 kg)   BMI 17.81 kg/m   Wt Readings from Last 3 Encounters:  11/30/22 97 lb 6.4 oz (44.2 kg)  11/18/22 100 lb (45.4 kg)  10/04/22 99 lb 6.4 oz (45.1 kg)    Physical Exam  Constitutional: + BMI of 18.9, not in acute distress, normal state of mind Eyes: PERRLA, EOMI, no exophthalmos ENT: moist mucous membranes, no thyromegaly, no cervical lymphadenopathy   CMP ( most recent) CMP     Component Value Date/Time   NA 144 11/25/2022 0917   K 4.1 11/25/2022 0917   CL 108 (H) 11/25/2022 0917   CO2 19 (L) 11/25/2022 0917   GLUCOSE 86 11/25/2022 0917   GLUCOSE 111 (H) 01/11/2017 1440   BUN 13 11/25/2022 0917   CREATININE 0.51 (L) 11/25/2022 0917   CREATININE 0.55 11/03/2013 0951   CALCIUM 9.0 11/25/2022 0917   PROT 6.5 11/25/2022 0917   PROT 7.0 12/09/2011 0900   ALBUMIN 4.5 11/25/2022 0917   AST 17 11/25/2022 0917   AST 18 12/09/2011 0900   ALT 9 11/25/2022 0917   ALKPHOS 65 11/25/2022 0917   ALKPHOS 76 12/09/2011 0900   BILITOT 0.3 11/25/2022 0917   BILITOT 0.3 12/09/2011 0900   GFRNONAA 105 01/11/2020 0811   GFRAA 121 01/11/2020 0811    Lipid Panel     Component Value Date/Time   CHOL 158 09/02/2021 0936   TRIG 94 08/17/2022 0948   HDL 58 08/17/2022 0948   CHOLHDL 2.7 09/02/2021 0936   CHOLHDL 2.5 11/03/2013 0951   VLDL 13 11/03/2013 0951   LDLCALC 81 09/02/2021 0936   LABVLDL 18 09/02/2021 0936      Lab Results  Component Value Date   TSH 1.310 10/23/2020   TSH 1.430 01/11/2020   TSH 1.110 12/01/2017   TSH 1.030 01/14/2016   TSH 1.030 09/10/2015   TSH 0.962 04/25/2015   TSH 0.924 11/03/2013   FREET4 1.32 10/23/2020   FREET4 1.45 01/14/2016    Her  previsit bone density on October 13, 2022 AP Spine L1-L4 (L2) 10/13/2022 58.4 Osteopenia -2.4 0.883 g/cm2 6.5% Yes AP Spine L1-L4 (L2) 10/08/2020 56.4 Osteoporosis -2.8 0.829 g/cm2 -9.5% Yes AP Spine L1-L4 (L2) 06/26/2015 51.1 Osteopenia -2.1 0.916 g/cm2 4.2% Yes AP Spine L1-L4 (L2) 05/18/2013 49.0 Osteopenia -2.4 0.879 g/cm2 - -   DualFemur Neck Right 10/13/2022 58.4 Osteopenia -2.4 0.708 g/cm2 7.4% Yes DualFemur Neck Right 10/08/2020 56.4 Osteoporosis -2.7 0.659 g/cm2 -7.7% Yes DualFemur Neck Right 06/26/2015 51.1 Osteopenia -2.3 0.714 g/cm2 4.7% - DualFemur Neck Right 05/18/2013 49.0 Osteoporosis -2.6 0.682 g/cm2 - -   DualFemur Total Mean 10/13/2022 58.4 Osteopenia -1.9 0.765 g/cm2 5.4% Yes DualFemur Total Mean 10/08/2020 56.4 Osteopenia -2.2 0.726 g/cm2 -9.5% Yes DualFemur Total Mean 06/26/2015 51.1 Osteopenia -1.6 0.802 g/cm2 3.9% Yes DualFemur Total Mean 05/18/2013 49.0 Osteopenia -1.9 0.772 g/cm2 - -   ASSESSMENT: The BMD measured at Femur Neck Right is 0.708 g/cm2 with a T-score of -2.4. This patient is considered osteopenic according to World Health Organization [facility]) criteria. The scan quality is good. Compared with the prior study on 10/08/20 , the BMD of the total mean and ap spine show a statistically significant increase. L2 was excluded due to advanced degenerative changes. Patient is not a candidate for FRAX assessment due to being on Prolia.  Assessment: 1. Osteoporosis 2.  Bisphosphonates intolerance, GERD  Plan: 1. Osteoporosis -Likely multifactorial including postmenopausal, steroids, smoking.   -I discussed her available bone density studies including her most recent one on October 13, 2022.  She is benefiting from Prolia treatment.  There is statistically significant gain in her BMD on her latest DEXA scan.  She will receive her next injection in few days when it is ready, and will continue every 47-month. She  has tolerated her previous Prolia  injections.       We discussed about the different medication classes, benefits and side effects (including atypical fractures and ONJ - no dental workup in progress or planned).  -She has severe GERD, did not tolerate oral bisphosphonates.  She did not tolerate IV bisphosphonate either.    Her previsit CMP are favorable.      She has no evidence of primary hyperparathyroidism nor thyroid dysfunction.      -She is made aware of the fact that she cannot withdraw Prolia treatment without proper replacement/backup to avoid atypical fracture. - I explained the mechanism of action and expected benefits.   - we reviewed her dietary and supplemental calcium and vitamin D intake, which I advised her to increase her vitamin D to 2000 units daily, calcium 750 mg twice a day.  We also discussed food sources of this elements and vitamin.  - discussed fall precautions   -She remains a chronic active smoker: The patient was counseled on the dangers of tobacco use, and was advised to quit.  Reviewed strategies to maximize success, including removing cigarettes and smoking materials from environment.   - I advised patient to maintain close follow up with Babs Sciara, MD for primary ae needs.   I spent  22  minutes in the care of the patient today including review of labs from Thyroid Function, CMP, and other relevant labs ; imaging/biopsy records (current and previous including abstractions from other facilities); face-to-face time discussing  her lab results and symptoms, medications doses, her options of short and long term treatment based on the latest standards of care / guidelines;   and documenting the encounter.  Beecher Mcardle  participated in the discussions, expressed understanding, and voiced agreement with the above plans.  All questions were answered to her satisfaction. she is encouraged to contact clinic should she have any questions or concerns prior to her return visit.    Follow  up plan: Return in about 6 months (around 06/01/2023), or Prolia soon and in 6 months, for F/U with Pre-visit Labs.   Marquis Lunch, MD [facility] [address] [place], [place] [address] Phone: [phone]  Fax: [phone]     11/30/2022, 10:30 PM  This note was partially dictated with voice recognition software. Similar sounding words can be transcribed inadequately or may not  be corrected upon review.

## 2022-12-03 ENCOUNTER — Ambulatory Visit: Payer: BC Managed Care – PPO | Admitting: Family Medicine

## 2022-12-03 VITALS — BP 136/72 | HR 83 | Ht 62.0 in | Wt 99.2 lb

## 2022-12-03 DIAGNOSIS — M5432 Sciatica, left side: Secondary | ICD-10-CM | POA: Diagnosis not present

## 2022-12-03 DIAGNOSIS — N8110 Cystocele, unspecified: Secondary | ICD-10-CM

## 2022-12-03 DIAGNOSIS — R102 Pelvic and perineal pain unspecified side: Secondary | ICD-10-CM

## 2022-12-03 MED ORDER — HYDROCODONE-ACETAMINOPHEN 10-325 MG PO TABS: ORAL_TABLET | ORAL | 0 refills | Status: DC

## 2022-12-03 NOTE — Progress Notes (Signed)
   Subjective:    Patient ID: Isabel Atkinson, female    DOB: [birth year], 57 y.o.   MRN: [id number]  HPI Patient arrives today for 2 month follow up.  Patient relates ongoing back pain and discomfort radiating into her groin.  She was seen by neurosurgery that did a MRI and CT scan showed a bulging disc may or may not be pushing on S1 nerve Symptomatically her symptoms go along with S1 nerve compression She has also been seen by gynecology who is recommending a hysterectomy due to uterine prolapse It is doubtful that her pain is being caused by this issue Patient is out under FMLA currently She works in [employer] does lifting pulling pushing stooping squatting.  She is unable to do this currently because of every time she tries to do any forceful lifting squatting bending pushing or pulling it aggravates the pain in her back that radiates into the groin She is trying to get by on Tylenol alone but she does have to use pain medicine intermittently She responsibly takes it she knows it can be abused and be addictive in addition to this she is trying to do stretches at home she is motivated to get better and return to work Patient states she needs work note to extend [employer].     Review of Systems     Objective:   Physical Exam  General-in no acute distress Eyes-no discharge Lungs-respiratory rate normal, CTA CV-no murmurs,RRR Extremities skin warm dry no edema Neuro grossly normal Behavior normal, alert Positive straight leg raise on the left side increased pain with stretching and flexion of the hip      Assessment & Plan:  1. Pelvic pain Patient will be having hysterectomy early May This will keep her from being able to do any type of vigorous activity for at least 6 to 12 weeks will touch base with Dr.Eure to get further details  2. Sciatica of left side Patient did not tolerate gabapentin Originally was on stronger medicine now just on hydrocodone Uses it  intermittently She is aware can cause drowsiness Her back specialist-Dr.Cram-tried 2 injections neither one of them seem to help She is unable to do any significant lifting more than 10 pounds unable to do squatting pushing pulling and stooping which is required for her job working in [employer] The goal is to get her back to full strength by August so she can return to work Work excuse through the end of July More than likely will consult physiatrist but we will try to find out more information regarding gynecology surgery first in order to time this properly  3. Vaginal prolapse Hysterectomy as per above  Significant time spent with patient discussing our approach and looking into this further  Myelogram does not show impingement of the nerve but her symptoms are consistent with S1 nerve impingement Once again will pursues additional opinion with physiatrist but holding on this till further information on gynecology is found   Dr Eure-recommends waiting 6 weeks after her hysterectomy before allowing any type of physiatrist/physical therapy.  I sent a MyChart message to the patient to give [place] an update in approximately 1 month after all of this surgery and then we can initiate the above.  She is not capable working currently because of these underlying issues.

## 2022-12-09 ENCOUNTER — Telehealth: Payer: Self-pay | Admitting: "Endocrinology

## 2022-12-09 NOTE — Telephone Encounter (Signed)
Pt left a VM asking if you have received her Proila injection yet so that she can schedule a nurse visit.

## 2022-12-09 NOTE — Patient Instructions (Signed)
Isabel Atkinson  12/09/2022     @PREFPERIOPPHARMACY @   Your procedure is scheduled on  12/14/2022.   Report to [facility] at  0600  A.M.   Call this number if you have problems the morning of surgery:  [phone]  If you experience any cold or flu symptoms such as cough, fever, chills, shortness of breath, etc. between now and your scheduled surgery, please notify us at the above number.   Remember:  Do not eat after midnight.   You may drink clear liquids until  0330 am on 12/14/2022.    Clear liquids allowed are:                    Water, Juice (No red color; non-citric and without pulp; diabetics please choose diet or no sugar options), Carbonated beverages (diabetics please choose diet or no sugar options), Clear Tea (No creamer, milk, or cream, including half & half and powdered creamer), Black Coffee Only (No creamer, milk or cream, including half & half and powdered creamer), Plain Jell-O Only (No red color; diabetics please choose no sugar options), Clear Sports drink (No red color; diabetics please choose diet or no sugar options), and Plain Popsicles Only (No red color; diabetics please choose no sugar options)       At 0330 am on 12/14/2022 drink your carb drink. You can have nothing else to drink after this.    Take these medicines the morning of surgery with A SIP OF WATER          xanax(if needed), hydrocodone(if needed), omeprazole, zofran (if needed).    Do not wear jewelry, make-up or nail polish.  Do not wear lotions, powders, or perfumes, or deodorant.  Do not shave 48 hours prior to surgery.  Men may shave face and neck.  Do not bring valuables to the hospital.  [facility] is not responsible for any belongings or valuables.  Contacts, dentures or bridgework may not be worn into surgery.  Leave your suitcase in the car.  After surgery it may be brought to your room.  For patients admitted to the hospital, discharge time will be determined by your  treatment team.  Patients discharged the day of surgery will not be allowed to drive home and must have someone with them for 24 hours.    Special instructions:   DO NOT smoke tobacco or vape for 24 hours before your procedure.  Please read over the following fact sheets that you were given. Pain Booklet, Coughing and Deep Breathing, Blood Transfusion Information, Surgical Site Infection Prevention, Anesthesia Post-op Instructions, and Care and Recovery After Surgery        Total Laparoscopic Hysterectomy, Care After The following information offers guidance on how to care for yourself after your procedure. Your health care provider may also give you more specific instructions. If you have problems or questions, contact your health care provider. What can I expect after the procedure? After the procedure, it is common to have: Pain, bruising, and numbness around your incisions. Tiredness (fatigue). Poor appetite. Less interest in sex. Vaginal discharge or bleeding. You will need to use a sanitary pad after this procedure. Feelings of sadness or other emotions. If your ovaries were also removed, it is also common to have symptoms of menopause, such as hot flashes, night sweats, and lack of sleep (insomnia). Follow these instructions at home: Medicines Take over-the-counter and prescription medicines only as told by your health  care provider. Ask your health care provider if the medicine prescribed to you: Requires you to avoid driving or using machinery. Can cause constipation. You may need to take these actions to prevent or treat constipation: Drink enough fluid to keep your urine pale yellow. Take over-the-counter or prescription medicines. Eat foods that are high in fiber, such as beans, whole grains, and fresh fruits and vegetables. Limit foods that are high in fat and processed sugars, such as fried or sweet foods. Incision care  Follow instructions from your health care  provider about how to take care of your incisions. Make sure you: Wash your hands with soap and water for at least 20 seconds before and after you change your bandage (dressing). If soap and water are not available, use hand sanitizer. Change your dressing as told by your health care provider. Leave stitches (sutures), skin glue, or adhesive strips in place. These skin closures may need to stay in place for 2 weeks or longer. If adhesive strip edges start to loosen and curl up, you may trim the loose edges. Do not remove adhesive strips completely unless your health care provider tells you to do that. Check your incision areas every day for signs of infection. Check for: More redness, swelling, or pain. Fluid or blood. Warmth. Pus or a bad smell. Activity  Rest as told by your health care provider. Avoid sitting for a long time without moving. Get up to take short walks every 1-2 hours. This is important to improve blood flow and breathing. Ask for help if you feel weak or unsteady. Return to your normal activities as told by your health care provider. Ask your health care provider what activities are safe for you. Do not lift anything that is heavier than 10 lb (4.5 kg), or the limit that you are told, for one month after surgery or until your health care provider says that it is safe. If you were given a sedative during the procedure, it can affect you for several hours. Do not drive or operate machinery until your health care provider says that it is safe. Lifestyle Do not use any products that contain nicotine or tobacco. These products include cigarettes, chewing tobacco, and vaping devices, such as e-cigarettes. These can delay healing after surgery. If you need help quitting, ask your health care provider. Do not drink alcohol until your health care provider approves. General instructions  Do not douche, use tampons, or have sex for at least 6 weeks, or as told by your health care  provider. If you struggle with physical or emotional changes after your procedure, speak with your health care provider or a therapist. Do not take baths, swim, or use a hot tub until your health care provider approves. You may only be allowed to take showers for 2-3 weeks. Keep your dressing dry until your health care provider says it can be removed. Try to have someone at home with you for the first 1-2 weeks to help with your daily chores. Wear compression stockings as told by your health care provider. These stockings help to prevent blood clots and reduce swelling in your legs. Keep all follow-up visits. This is important. Contact a health care provider if: You have any of these signs of infection: Chills or a fever. More redness, swelling, or pain around an incision. Fluid or blood coming from an incision. Warmth coming from an incision. Pus or a bad smell coming from an incision. An incision opens. You feel dizzy or  light-headed. You have pain or bleeding when you urinate, or you are unable to urinate. You have abnormal vaginal discharge. You have pain that does not get better with medicine. Get help right away if: You have a fever and your symptoms suddenly get worse. You have severe abdominal pain. You have chest pain or shortness of breath. You faint. You have pain, swelling, or redness in your leg. You have heavy vaginal bleeding with blood clots, soaking through a sanitary pad in less than 1 hour. These symptoms may represent a serious problem that is an emergency. Do not wait to see if the symptoms will go away. Get medical help right away. Call your local emergency services (911 in the [place] Do not drive yourself to the hospital. Summary After the procedure, it is common to have pain and bruising around your incisions. Do not take baths, swim, or use a hot tub until your health care provider approves. Do not lift anything that is heavier than 10 lb (4.5 kg), or the limit  that you are told, for one month after surgery or until your health care provider says that it is safe. Tell your health care provider if you have any signs or symptoms of infection after the procedure. Get help right away if you have severe abdominal pain, chest pain, shortness of breath, or heavy bleeding from your vagina. This information is not intended to replace advice given to you by your health care provider. Make sure you discuss any questions you have with your health care provider. Document Revised: 03/28/2020 Document Reviewed: 03/28/2020 Elsevier Patient Education  2023 [employer] General Anesthesia, Adult, Care After The following information offers guidance on how to care for yourself after your procedure. Your health care provider may also give you more specific instructions. If you have problems or questions, contact your health care provider. What can I expect after the procedure? After the procedure, it is common for people to: Have pain or discomfort at the IV site. Have nausea or vomiting. Have a sore throat or hoarseness. Have trouble concentrating. Feel cold or chills. Feel weak, sleepy, or tired (fatigue). Have soreness and body aches. These can affect parts of the body that were not involved in surgery. Follow these instructions at home: For the time period you were told by your health care provider:  Rest. Do not participate in activities where you could fall or become injured. Do not drive or use machinery. Do not drink alcohol. Do not take sleeping pills or medicines that cause drowsiness. Do not make important decisions or sign legal documents. Do not take care of children on your own. General instructions Drink enough fluid to keep your urine pale yellow. If you have sleep apnea, surgery and certain medicines can increase your risk for breathing problems. Follow instructions from your health care provider about wearing your sleep device: Anytime you are  sleeping, including during daytime naps. While taking prescription pain medicines, sleeping medicines, or medicines that make you drowsy. Return to your normal activities as told by your health care provider. Ask your health care provider what activities are safe for you. Take over-the-counter and prescription medicines only as told by your health care provider. Do not use any products that contain nicotine or tobacco. These products include cigarettes, chewing tobacco, and vaping devices, such as e-cigarettes. These can delay incision healing after surgery. If you need help quitting, ask your health care provider. Contact a health care provider if: You have nausea or vomiting that  does not get better with medicine. You vomit every time you eat or drink. You have pain that does not get better with medicine. You cannot urinate or have bloody urine. You develop a skin rash. You have a fever. Get help right away if: You have trouble breathing. You have chest pain. You vomit blood. These symptoms may be an emergency. Get help right away. Call 911. Do not wait to see if the symptoms will go away. Do not drive yourself to the hospital. Summary After the procedure, it is common to have a sore throat, hoarseness, nausea, vomiting, or to feel weak, sleepy, or fatigue. For the time period you were told by your health care provider, do not drive or use machinery. Get help right away if you have difficulty breathing, have chest pain, or vomit blood. These symptoms may be an emergency. This information is not intended to replace advice given to you by your health care provider. Make sure you discuss any questions you have with your health care provider. Document Revised: 10/23/2021 Document Reviewed: 10/23/2021 Elsevier Patient Education  2023 [employer] How to Use Chlorhexidine Before Surgery Chlorhexidine gluconate (CHG) is a germ-killing (antiseptic) solution that is used to clean the skin. It  can get rid of the bacteria that normally live on the skin and can keep them away for about 24 hours. To clean your skin with CHG, you may be given: A CHG solution to use in the shower or as part of a sponge bath. A prepackaged cloth that contains CHG. Cleaning your skin with CHG may help lower the risk for infection: While you are staying in the intensive care unit of the hospital. If you have a vascular access, such as a central line, to provide short-term or long-term access to your veins. If you have a catheter to drain urine from your bladder. If you are on a ventilator. A ventilator is a machine that helps you breathe by moving air in and out of your lungs. After surgery. What are the risks? Risks of using CHG include: A skin reaction. Hearing loss, if CHG gets in your ears and you have a perforated eardrum. Eye injury, if CHG gets in your eyes and is not rinsed out. The CHG product catching fire. Make sure that you avoid smoking and flames after applying CHG to your skin. Do not use CHG: If you have a chlorhexidine allergy or have previously reacted to chlorhexidine. On babies younger than 88 months of age. How to use CHG solution Use CHG only as told by your health care provider, and follow the instructions on the label. Use the full amount of CHG as directed. Usually, this is one bottle. During a shower Follow these steps when using CHG solution during a shower (unless your health care provider gives you different instructions): Start the shower. Use your normal soap and shampoo to wash your face and hair. Turn off the shower or move out of the shower stream. Pour the CHG onto a clean washcloth. Do not use any type of brush or rough-edged sponge. Starting at your neck, lather your body down to your toes. Make sure you follow these instructions: If you will be having surgery, pay special attention to the part of your body where you will be having surgery. Scrub this area for at  least 1 minute. Do not use CHG on your head or face. If the solution gets into your ears or eyes, rinse them well with water. Avoid your genital  area. Avoid any areas of skin that have broken skin, cuts, or scrapes. Scrub your back and under your arms. Make sure to wash skin folds. Let the lather sit on your skin for 1-2 minutes or as long as told by your health care provider. Thoroughly rinse your entire body in the shower. Make sure that all body creases and crevices are rinsed well. Dry off with a clean towel. Do not put any substances on your body afterward--such as powder, lotion, or perfume--unless you are told to do so by your health care provider. Only use lotions that are recommended by the manufacturer. Put on clean clothes or pajamas. If it is the night before your surgery, sleep in clean sheets.  During a sponge bath Follow these steps when using CHG solution during a sponge bath (unless your health care provider gives you different instructions): Use your normal soap and shampoo to wash your face and hair. Pour the CHG onto a clean washcloth. Starting at your neck, lather your body down to your toes. Make sure you follow these instructions: If you will be having surgery, pay special attention to the part of your body where you will be having surgery. Scrub this area for at least 1 minute. Do not use CHG on your head or face. If the solution gets into your ears or eyes, rinse them well with water. Avoid your genital area. Avoid any areas of skin that have broken skin, cuts, or scrapes. Scrub your back and under your arms. Make sure to wash skin folds. Let the lather sit on your skin for 1-2 minutes or as long as told by your health care provider. Using a different clean, wet washcloth, thoroughly rinse your entire body. Make sure that all body creases and crevices are rinsed well. Dry off with a clean towel. Do not put any substances on your body afterward--such as powder, lotion,  or perfume--unless you are told to do so by your health care provider. Only use lotions that are recommended by the manufacturer. Put on clean clothes or pajamas. If it is the night before your surgery, sleep in clean sheets. How to use CHG prepackaged cloths Only use CHG cloths as told by your health care provider, and follow the instructions on the label. Use the CHG cloth on clean, dry skin. Do not use the CHG cloth on your head or face unless your health care provider tells you to. When washing with the CHG cloth: Avoid your genital area. Avoid any areas of skin that have broken skin, cuts, or scrapes. Before surgery Follow these steps when using a CHG cloth to clean before surgery (unless your health care provider gives you different instructions): Using the CHG cloth, vigorously scrub the part of your body where you will be having surgery. Scrub using a back-and-forth motion for 3 minutes. The area on your body should be completely wet with CHG when you are done scrubbing. Do not rinse. Discard the cloth and let the area air-dry. Do not put any substances on the area afterward, such as powder, lotion, or perfume. Put on clean clothes or pajamas. If it is the night before your surgery, sleep in clean sheets.  For general bathing Follow these steps when using CHG cloths for general bathing (unless your health care provider gives you different instructions). Use a separate CHG cloth for each area of your body. Make sure you wash between any folds of skin and between your fingers and toes. Wash your body in  the following order, switching to a new cloth after each step: The front of your neck, shoulders, and chest. Both of your arms, under your arms, and your hands. Your stomach and groin area, avoiding the genitals. Your right leg and foot. Your left leg and foot. The back of your neck, your back, and your buttocks. Do not rinse. Discard the cloth and let the area air-dry. Do not put any  substances on your body afterward--such as powder, lotion, or perfume--unless you are told to do so by your health care provider. Only use lotions that are recommended by the manufacturer. Put on clean clothes or pajamas. Contact a health care provider if: Your skin gets irritated after scrubbing. You have questions about using your solution or cloth. You swallow any chlorhexidine. Call your local poison control center ([phone] in the [place] Get help right away if: Your eyes itch badly, or they become very red or swollen. Your skin itches badly and is red or swollen. Your hearing changes. You have trouble seeing. You have swelling or tingling in your mouth or throat. You have trouble breathing. These symptoms may represent a serious problem that is an emergency. Do not wait to see if the symptoms will go away. Get medical help right away. Call your local emergency services (911 in the [place] Do not drive yourself to the hospital. Summary Chlorhexidine gluconate (CHG) is a germ-killing (antiseptic) solution that is used to clean the skin. Cleaning your skin with CHG may help to lower your risk for infection. You may be given CHG to use for bathing. It may be in a bottle or in a prepackaged cloth to use on your skin. Carefully follow your health care provider's instructions and the instructions on the product label. Do not use CHG if you have a chlorhexidine allergy. Contact your health care provider if your skin gets irritated after scrubbing. This information is not intended to replace advice given to you by your health care provider. Make sure you discuss any questions you have with your health care provider. Document Revised: 11/23/2021 Document Reviewed: 10/06/2020 Elsevier Patient Education  2023 [employer].

## 2022-12-10 ENCOUNTER — Encounter (HOSPITAL_COMMUNITY)
Admission: RE | Admit: 2022-12-10 | Discharge: 2022-12-10 | Disposition: A | Discharge status: Home / Self Care | Payer: BC Managed Care – PPO | Source: Ambulatory Visit | Attending: Obstetrics & Gynecology | Admitting: Obstetrics & Gynecology

## 2022-12-10 ENCOUNTER — Encounter (HOSPITAL_COMMUNITY): Payer: Self-pay

## 2022-12-10 ENCOUNTER — Other Ambulatory Visit: Payer: Self-pay | Admitting: Obstetrics & Gynecology

## 2022-12-10 ENCOUNTER — Encounter: Payer: Self-pay | Admitting: Family Medicine

## 2022-12-10 VITALS — BP 122/64 | HR 76 | Temp 97.6°F | Resp 18 | Ht 62.0 in | Wt 99.2 lb

## 2022-12-10 DIAGNOSIS — R7303 Prediabetes: Secondary | ICD-10-CM | POA: Insufficient documentation

## 2022-12-10 DIAGNOSIS — Z01818 Encounter for other preprocedural examination: Secondary | ICD-10-CM

## 2022-12-10 DIAGNOSIS — Z01812 Encounter for preprocedural laboratory examination: Secondary | ICD-10-CM | POA: Diagnosis present

## 2022-12-10 DIAGNOSIS — M25559 Pain in unspecified hip: Secondary | ICD-10-CM

## 2022-12-10 HISTORY — DX: Cardiac arrhythmia, unspecified: I49.9

## 2022-12-10 LAB — CBC
HCT: 36.8 % (ref 36.0–46.0)
Hemoglobin: 12 g/dL (ref 12.0–15.0)
MCH: 30.3 pg (ref 26.0–34.0)
MCHC: 32.6 g/dL (ref 30.0–36.0)
MCV: 92.9 fL (ref 80.0–100.0)
Platelets: 256 10*3/uL (ref 150–400)
RBC: 3.96 MIL/uL (ref 3.87–5.11)
RDW: 13.4 % (ref 11.5–15.5)
WBC: 8 10*3/uL (ref 4.0–10.5)
nRBC: 0 % (ref 0.0–0.2)

## 2022-12-10 LAB — URINALYSIS, ROUTINE W REFLEX MICROSCOPIC
Bilirubin Urine: NEGATIVE
Glucose, UA: NEGATIVE mg/dL
Hgb urine dipstick: NEGATIVE
Ketones, ur: NEGATIVE mg/dL
Leukocytes,Ua: NEGATIVE
Nitrite: NEGATIVE
Protein, ur: NEGATIVE mg/dL
Specific Gravity, Urine: 1.026 (ref 1.005–1.030)
pH: 5 (ref 5.0–8.0)

## 2022-12-10 LAB — TYPE AND SCREEN
ABO/RH(D): O POS
Antibody Screen: NEGATIVE

## 2022-12-10 LAB — COMPREHENSIVE METABOLIC PANEL
ALT: 14 U/L (ref 0–44)
AST: 19 U/L (ref 15–41)
Albumin: 4 g/dL (ref 3.5–5.0)
Alkaline Phosphatase: 46 U/L (ref 38–126)
Anion gap: 9 (ref 5–15)
BUN: 16 mg/dL (ref 6–20)
CO2: 23 mmol/L (ref 22–32)
Calcium: 9.1 mg/dL (ref 8.9–10.3)
Chloride: 103 mmol/L (ref 98–111)
Creatinine, Ser: 0.58 mg/dL (ref 0.44–1.00)
GFR, Estimated: >60 mL/min (ref >60)
Glucose, Bld: 111 mg/dL — ABNORMAL HIGH (ref 70–99)
Potassium: 3.5 mmol/L (ref 3.5–5.1)
Sodium: 135 mmol/L (ref 135–145)
Total Bilirubin: 0.5 mg/dL (ref 0.3–1.2)
Total Protein: 6.4 g/dL — ABNORMAL LOW (ref 6.5–8.1)

## 2022-12-10 LAB — RAPID HIV SCREEN (HIV 1/2 AB+AG)
HIV 1/2 Antibodies: NONREACTIVE
HIV-1 P24 Antigen - HIV24: NONREACTIVE

## 2022-12-10 LAB — HEMOGLOBIN A1C
Hgb A1c MFr Bld: 5.7 % — ABNORMAL HIGH (ref 4.8–5.6)
Mean Plasma Glucose: 116.89 mg/dL

## 2022-12-14 ENCOUNTER — Telehealth: Payer: Self-pay

## 2022-12-14 ENCOUNTER — Ambulatory Visit (HOSPITAL_COMMUNITY): Payer: BC Managed Care – PPO | Admitting: Certified Registered"

## 2022-12-14 ENCOUNTER — Encounter (HOSPITAL_COMMUNITY): Payer: Self-pay | Admitting: Obstetrics & Gynecology

## 2022-12-14 ENCOUNTER — Ambulatory Visit (HOSPITAL_COMMUNITY)
Admission: RE | Admit: 2022-12-14 | Discharge: 2022-12-14 | Disposition: A | Discharge status: Home / Self Care | Payer: BC Managed Care – PPO | Attending: Obstetrics & Gynecology | Admitting: Obstetrics & Gynecology

## 2022-12-14 ENCOUNTER — Other Ambulatory Visit: Payer: Self-pay

## 2022-12-14 ENCOUNTER — Encounter (HOSPITAL_COMMUNITY)
Admission: RE | Disposition: A | Discharge status: Home / Self Care | Payer: Self-pay | Source: Home / Self Care | Attending: Obstetrics & Gynecology

## 2022-12-14 DIAGNOSIS — D279 Benign neoplasm of unspecified ovary: Secondary | ICD-10-CM | POA: Insufficient documentation

## 2022-12-14 DIAGNOSIS — N83209 Unspecified ovarian cyst, unspecified side: Secondary | ICD-10-CM

## 2022-12-14 DIAGNOSIS — N838 Other noninflammatory disorders of ovary, fallopian tube and broad ligament: Secondary | ICD-10-CM | POA: Diagnosis not present

## 2022-12-14 DIAGNOSIS — F17210 Nicotine dependence, cigarettes, uncomplicated: Secondary | ICD-10-CM | POA: Diagnosis not present

## 2022-12-14 DIAGNOSIS — I878 Other specified disorders of veins: Secondary | ICD-10-CM | POA: Diagnosis not present

## 2022-12-14 DIAGNOSIS — N814 Uterovaginal prolapse, unspecified: Secondary | ICD-10-CM | POA: Diagnosis present

## 2022-12-14 DIAGNOSIS — N888 Other specified noninflammatory disorders of cervix uteri: Secondary | ICD-10-CM | POA: Diagnosis not present

## 2022-12-14 DIAGNOSIS — N812 Incomplete uterovaginal prolapse: Secondary | ICD-10-CM

## 2022-12-14 HISTORY — PX: VAGINAL PROLAPSE REPAIR: SHX830

## 2022-12-14 HISTORY — PX: ROBOTIC ASSISTED LAPAROSCOPIC HYSTERECTOMY AND SALPINGECTOMY: SHX6379

## 2022-12-14 LAB — ABO/RH: ABO/RH(D): O POS

## 2022-12-14 LAB — GLUCOSE, CAPILLARY: Glucose-Capillary: 68 mg/dL — ABNORMAL LOW (ref 70–99)

## 2022-12-14 SURGERY — XI ROBOTIC ASSISTED LAPAROSCOPIC HYSTERECTOMY AND SALPINGECTOMY
Anesthesia: General | Site: Vagina

## 2022-12-14 MED ORDER — KETOROLAC TROMETHAMINE 10 MG PO TABS: 10.0000 mg | ORAL_TABLET | Freq: Three times a day (TID) | ORAL | 0 refills | Status: DC | PRN

## 2022-12-14 MED ORDER — ONDANSETRON 8 MG PO TBDP: 8.0000 mg | ORAL_TABLET | Freq: Three times a day (TID) | ORAL | 0 refills | Status: DC | PRN

## 2022-12-14 MED ORDER — FENTANYL CITRATE (PF) 250 MCG/5ML IJ SOLN
INTRAMUSCULAR | Status: AC
  Filled 2022-12-14: qty 5

## 2022-12-14 MED ORDER — MIDAZOLAM HCL 2 MG/2ML IJ SOLN
INTRAMUSCULAR | Status: AC
  Filled 2022-12-14: qty 2

## 2022-12-14 MED ORDER — OXYCODONE HCL 5 MG PO TABS: 5.0000 mg | ORAL_TABLET | Freq: Once | ORAL | Status: DC | PRN

## 2022-12-14 MED ORDER — FENTANYL CITRATE PF 50 MCG/ML IJ SOSY: 25.0000 ug | PREFILLED_SYRINGE | INTRAMUSCULAR | Status: DC | PRN

## 2022-12-14 MED ORDER — ROCURONIUM BROMIDE 10 MG/ML (PF) SYRINGE
PREFILLED_SYRINGE | INTRAVENOUS | Status: DC | PRN
  Administered 2022-12-14: 40 mg via INTRAVENOUS
  Administered 2022-12-14: 10 mg via INTRAVENOUS

## 2022-12-14 MED ORDER — DIPHENHYDRAMINE HCL 50 MG/ML IJ SOLN
INTRAMUSCULAR | Status: AC
  Filled 2022-12-14: qty 1

## 2022-12-14 MED ORDER — LIDOCAINE HCL (PF) 2 % IJ SOLN
INTRAMUSCULAR | Status: AC
  Filled 2022-12-14: qty 5

## 2022-12-14 MED ORDER — PROPOFOL 10 MG/ML IV BOLUS
INTRAVENOUS | Status: DC | PRN
  Administered 2022-12-14: 40 mg via INTRAVENOUS
  Administered 2022-12-14: 100 mg via INTRAVENOUS

## 2022-12-14 MED ORDER — OXYCODONE-ACETAMINOPHEN 7.5-325 MG PO TABS: 1.0000 | ORAL_TABLET | Freq: Four times a day (QID) | ORAL | 0 refills | Status: DC | PRN

## 2022-12-14 MED ORDER — PROPOFOL 500 MG/50ML IV EMUL
INTRAVENOUS | Status: AC
  Filled 2022-12-14: qty 50

## 2022-12-14 MED ORDER — DEXAMETHASONE SODIUM PHOSPHATE 10 MG/ML IJ SOLN
INTRAMUSCULAR | Status: AC
  Filled 2022-12-14: qty 1

## 2022-12-14 MED ORDER — POVIDONE-IODINE 10 % EX SWAB
2.0000 | Freq: Once | CUTANEOUS | Status: AC
  Administered 2022-12-14: 2 via TOPICAL

## 2022-12-14 MED ORDER — CHLORHEXIDINE GLUCONATE 0.12 % MT SOLN: 15.0000 mL | Freq: Once | OROMUCOSAL | Status: AC

## 2022-12-14 MED ORDER — FENTANYL CITRATE (PF) 250 MCG/5ML IJ SOLN
INTRAMUSCULAR | Status: DC | PRN
  Administered 2022-12-14 (×5): 50 ug via INTRAVENOUS

## 2022-12-14 MED ORDER — LACTATED RINGERS IV SOLN: INTRAVENOUS | Status: DC

## 2022-12-14 MED ORDER — KETOROLAC TROMETHAMINE 30 MG/ML IJ SOLN
30.0000 mg | Freq: Once | INTRAMUSCULAR | Status: AC
  Administered 2022-12-14: 30 mg via INTRAVENOUS
  Filled 2022-12-14: qty 1

## 2022-12-14 MED ORDER — BUPIVACAINE LIPOSOME 1.3 % IJ SUSP
INTRAMUSCULAR | Status: AC
  Filled 2022-12-14: qty 20

## 2022-12-14 MED ORDER — SEVOFLURANE IN SOLN
RESPIRATORY_TRACT | Status: AC
  Filled 2022-12-14: qty 250

## 2022-12-14 MED ORDER — SCOPOLAMINE 1 MG/3DAYS TD PT72
MEDICATED_PATCH | TRANSDERMAL | Status: AC
  Filled 2022-12-14: qty 1

## 2022-12-14 MED ORDER — KETOROLAC TROMETHAMINE 60 MG/2ML IM SOLN
30.0000 mg | Freq: Once | INTRAMUSCULAR | Status: AC
  Filled 2022-12-14: qty 2

## 2022-12-14 MED ORDER — STERILE WATER FOR IRRIGATION IR SOLN
Status: DC | PRN
  Administered 2022-12-14: 1000 mL

## 2022-12-14 MED ORDER — PHENYLEPHRINE 80 MCG/ML (10ML) SYRINGE FOR IV PUSH (FOR BLOOD PRESSURE SUPPORT)
PREFILLED_SYRINGE | INTRAVENOUS | Status: AC
  Filled 2022-12-14: qty 10

## 2022-12-14 MED ORDER — CEFAZOLIN SODIUM-DEXTROSE 2-4 GM/100ML-% IV SOLN
2.0000 g | INTRAVENOUS | Status: AC
  Administered 2022-12-14: 2 g via INTRAVENOUS
  Filled 2022-12-14: qty 100

## 2022-12-14 MED ORDER — ROCURONIUM BROMIDE 10 MG/ML (PF) SYRINGE
PREFILLED_SYRINGE | INTRAVENOUS | Status: AC
  Filled 2022-12-14: qty 10

## 2022-12-14 MED ORDER — ONDANSETRON HCL 4 MG/2ML IJ SOLN
INTRAMUSCULAR | Status: AC
  Filled 2022-12-14: qty 2

## 2022-12-14 MED ORDER — PROPOFOL 500 MG/50ML IV EMUL
INTRAVENOUS | Status: DC | PRN
  Administered 2022-12-14: 25 ug/kg/min via INTRAVENOUS

## 2022-12-14 MED ORDER — OXYCODONE HCL 5 MG/5ML PO SOLN: 5.0000 mg | Freq: Once | ORAL | Status: DC | PRN

## 2022-12-14 MED ORDER — PHENYLEPHRINE 80 MCG/ML (10ML) SYRINGE FOR IV PUSH (FOR BLOOD PRESSURE SUPPORT)
PREFILLED_SYRINGE | INTRAVENOUS | Status: DC | PRN
  Administered 2022-12-14 (×3): 160 ug via INTRAVENOUS

## 2022-12-14 MED ORDER — SODIUM CHLORIDE (PF) 0.9 % IJ SOLN
INTRAMUSCULAR | Status: AC
  Filled 2022-12-14: qty 10

## 2022-12-14 MED ORDER — ONDANSETRON HCL 4 MG/2ML IJ SOLN: 4.0000 mg | Freq: Once | INTRAMUSCULAR | Status: DC | PRN

## 2022-12-14 MED ORDER — LIDOCAINE 2% (20 MG/ML) 5 ML SYRINGE
INTRAMUSCULAR | Status: DC | PRN
  Administered 2022-12-14: 40 mg via INTRAVENOUS

## 2022-12-14 MED ORDER — PHENYLEPHRINE HCL-NACL 20-0.9 MG/250ML-% IV SOLN
INTRAVENOUS | Status: DC | PRN
  Administered 2022-12-14: 30 ug/min via INTRAVENOUS

## 2022-12-14 MED ORDER — PROPOFOL 10 MG/ML IV BOLUS
INTRAVENOUS | Status: AC
  Filled 2022-12-14: qty 20

## 2022-12-14 MED ORDER — MIDAZOLAM HCL 2 MG/2ML IJ SOLN
INTRAMUSCULAR | Status: DC | PRN
  Administered 2022-12-14 (×2): 1 mg via INTRAVENOUS

## 2022-12-14 MED ORDER — BUPIVACAINE LIPOSOME 1.3 % IJ SUSP
20.0000 mL | Freq: Once | INTRAMUSCULAR | Status: DC
  Filled 2022-12-14: qty 20

## 2022-12-14 MED ORDER — DIPHENHYDRAMINE HCL 50 MG/ML IJ SOLN
INTRAMUSCULAR | Status: DC | PRN
  Administered 2022-12-14: 12.5 mg via INTRAVENOUS

## 2022-12-14 MED ORDER — ONDANSETRON HCL 4 MG/2ML IJ SOLN
INTRAMUSCULAR | Status: DC | PRN
  Administered 2022-12-14: 4 mg via INTRAVENOUS

## 2022-12-14 MED ORDER — SUGAMMADEX SODIUM 200 MG/2ML IV SOLN
INTRAVENOUS | Status: DC | PRN
  Administered 2022-12-14: 150 mg via INTRAVENOUS

## 2022-12-14 MED ORDER — DEXTROSE 50 % IV SOLN
25.0000 mL | Freq: Once | INTRAVENOUS | Status: AC
  Administered 2022-12-14: 50 mL via INTRAVENOUS

## 2022-12-14 MED ORDER — DEXAMETHASONE SODIUM PHOSPHATE 4 MG/ML IJ SOLN
INTRAMUSCULAR | Status: DC | PRN
  Administered 2022-12-14: 5 mg via INTRAVENOUS

## 2022-12-14 MED ORDER — DEXTROSE 50 % IV SOLN
INTRAVENOUS | Status: AC
  Filled 2022-12-14: qty 50

## 2022-12-14 MED ORDER — SCOPOLAMINE 1 MG/3DAYS TD PT72
1.0000 | MEDICATED_PATCH | Freq: Once | TRANSDERMAL | Status: DC
  Administered 2022-12-14: 1.5 mg via TRANSDERMAL

## 2022-12-14 MED ORDER — ORAL CARE MOUTH RINSE: 15.0000 mL | Freq: Once | OROMUCOSAL | Status: AC

## 2022-12-14 MED ORDER — CHLORHEXIDINE GLUCONATE 0.12 % MT SOLN
15.0000 mL | Freq: Once | OROMUCOSAL | Status: AC
  Administered 2022-12-14: 15 mL via OROMUCOSAL

## 2022-12-14 MED ORDER — BUPIVACAINE LIPOSOME 1.3 % IJ SUSP
INTRAMUSCULAR | Status: DC | PRN
  Administered 2022-12-14: 20 mL

## 2022-12-14 MED ORDER — KETOROLAC TROMETHAMINE 30 MG/ML IJ SOLN
INTRAMUSCULAR | Status: AC
  Administered 2022-12-14: 30 mg via INTRAMUSCULAR
  Filled 2022-12-14: qty 1

## 2022-12-14 SURGICAL SUPPLY — 61 items
ADH SKN CLS APL DERMABOND .7 (GAUZE/BANDAGES/DRESSINGS) ×2
BAG DRN RND TRDRP ANRFLXCHMBR (UROLOGICAL SUPPLIES) ×2
BAG HAMPER (MISCELLANEOUS) ×3 IMPLANT
BAG URINE DRAIN 2000ML AR STRL (UROLOGICAL SUPPLIES) ×3 IMPLANT
BLADE SURG SZ11 CARB STEEL (BLADE) ×3 IMPLANT
CLOTH BEACON ORANGE TIMEOUT ST (SAFETY) ×3 IMPLANT
COVER LIGHT HANDLE STERIS (MISCELLANEOUS) ×6 IMPLANT
COVER MAYO STAND XLG (MISCELLANEOUS) ×3 IMPLANT
COVER TIP SHEARS 8 DVNC (MISCELLANEOUS) ×3 IMPLANT
DECANTER SPIKE VIAL GLASS SM (MISCELLANEOUS) ×3 IMPLANT
DERMABOND ADVANCED .7 DNX12 (GAUZE/BANDAGES/DRESSINGS) ×3 IMPLANT
DRAPE ARM DVNC X/XI (DISPOSABLE) ×12 IMPLANT
DRAPE COLUMN DVNC XI (DISPOSABLE) ×3 IMPLANT
DRAPE HALF SHEET 40X57 (DRAPES) ×3 IMPLANT
DRAPE STERI URO 9X17 APER PCH (DRAPES) ×3 IMPLANT
DRIVER NDL MEGA SUTCUT DVNCXI (INSTRUMENTS) ×3 IMPLANT
DRIVER NDLE MEGA SUTCUT DVNCXI (INSTRUMENTS) ×2 IMPLANT
ELECT REM PT RETURN 9FT ADLT (ELECTROSURGICAL) ×2
ELECTRODE REM PT RTRN 9FT ADLT (ELECTROSURGICAL) ×3 IMPLANT
FORCEPS PROGRASP DVNC XI (FORCEP) ×3 IMPLANT
GAUZE 4X4 16PLY ~~LOC~~+RFID DBL (SPONGE) ×6 IMPLANT
GLOVE BIOGEL PI IND STRL 7.0 (GLOVE) ×9 IMPLANT
GLOVE BIOGEL PI IND STRL 8 (GLOVE) ×6 IMPLANT
GLOVE ECLIPSE 8.0 STRL XLNG CF (GLOVE) ×9 IMPLANT
GOWN STRL REUS W/TWL LRG LVL3 (GOWN DISPOSABLE) ×6 IMPLANT
GOWN STRL REUS W/TWL XL LVL3 (GOWN DISPOSABLE) ×6 IMPLANT
KIT PINK PAD W/HEAD ARE REST (MISCELLANEOUS) ×2
KIT PINK PAD W/HEAD ARM REST (MISCELLANEOUS) ×3 IMPLANT
KIT TURNOVER CYSTO (KITS) ×3 IMPLANT
KIT TURNOVER KIT A (KITS) ×3 IMPLANT
MANIFOLD NEPTUNE II (INSTRUMENTS) ×3 IMPLANT
MANIPULATOR VCARE SML CRV RETR (MISCELLANEOUS) IMPLANT
NDL HYPO 21X1.5 SAFETY (NEEDLE) ×3 IMPLANT
NDL INSUFFLATION 14GA 120MM (NEEDLE) ×3 IMPLANT
NEEDLE HYPO 21X1.5 SAFETY (NEEDLE) ×2 IMPLANT
NEEDLE INSUFFLATION 14GA 120MM (NEEDLE) ×2 IMPLANT
NS IRRIG 500ML POUR BTL (IV SOLUTION) ×3 IMPLANT
OBTURATOR OPTICAL STND 8 DVNC (TROCAR) ×4
OBTURATOR OPTICALSTD 8 DVNC (TROCAR) ×3 IMPLANT
PACK PERI GYN (CUSTOM PROCEDURE TRAY) ×3 IMPLANT
PAD ARMBOARD 7.5X6 YLW CONV (MISCELLANEOUS) ×3 IMPLANT
SCISSORS MNPLR CVD DVNC XI (INSTRUMENTS) ×3 IMPLANT
SEAL CANN UNIV 5-8 DVNC XI (MISCELLANEOUS) ×9 IMPLANT
SEALER VESSEL EXT DVNC XI (MISCELLANEOUS) ×3 IMPLANT
SET BASIN LINEN APH (SET/KITS/TRAYS/PACK) ×3 IMPLANT
SET TRI-LUMEN FLTR TB AIRSEAL (TUBING) ×3 IMPLANT
SOL ANTI FOG 6CC (MISCELLANEOUS) ×3 IMPLANT
SPONGE T-LAP 18X18 ~~LOC~~+RFID (SPONGE) ×3 IMPLANT
SUT STRATAFIX 0 PDS+ CT-2 23 (SUTURE) ×2
SUT VIC AB 0 CT1 27 (SUTURE) ×2
SUT VIC AB 0 CT1 27XBRD ANTBC (SUTURE) ×3 IMPLANT
SUT VICRYL 0 UR6 27IN ABS (SUTURE) ×3 IMPLANT
SUT VICRYL AB 3-0 FS1 BRD 27IN (SUTURE) ×6 IMPLANT
SUTURE STRATFX 0 PDS+ CT-2 23 (SUTURE) ×3 IMPLANT
SYR 10ML LL (SYRINGE) ×6 IMPLANT
SYR 20ML LL LF (SYRINGE) ×3 IMPLANT
SYR 50ML LL SCALE MARK (SYRINGE) ×6 IMPLANT
TRAY FOLEY W/BAG SLVR 16FR (SET/KITS/TRAYS/PACK) ×2
TRAY FOLEY W/BAG SLVR 16FR ST (SET/KITS/TRAYS/PACK) ×3 IMPLANT
TROCAR PORT AIRSEAL 8X120 (TROCAR) ×3 IMPLANT
WATER STERILE IRR 500ML POUR (IV SOLUTION) ×3 IMPLANT

## 2022-12-14 NOTE — Progress Notes (Signed)
Dr.Kiel notified of cbg at 0640 was 57.  He ordered an entire amp of D50 to be given IV.  Dose given at 0720.

## 2022-12-14 NOTE — Anesthesia Procedure Notes (Signed)
Procedure Name: Intubation Date/Time: 12/14/2022 7:41 AM  Performed by: Julian Reil, CRNAPre-anesthesia Checklist: Patient identified, Emergency Drugs available, Suction available and Patient being monitored Patient Re-evaluated:Patient Re-evaluated prior to induction Oxygen Delivery Method: Circle system utilized Preoxygenation: Pre-oxygenation with 100% oxygen Induction Type: IV induction Ventilation: Mask ventilation without difficulty Laryngoscope Size: Miller and 3 Grade View: Grade II Tube type: Oral Tube size: 7.0 mm Number of attempts: 1 Airway Equipment and Method: Stylet Placement Confirmation: ETT inserted through vocal cords under direct vision, positive ETCO2 and breath sounds checked- equal and bilateral Secured at: 22 cm Tube secured with: Tape Dental Injury: Teeth and Oropharynx as per pre-operative assessment  Comments: 4x4s bite block used.

## 2022-12-14 NOTE — H&P (Signed)
Preoperative History and Physical  Isabel Atkinson is a 57 y.o. [id number] with No LMP recorded. Patient is postmenopausal. admitted for a Xi Robot assist laparoscopic hysterectomy with removal of both tubes and ovaries with vaginal vault suspension  .  I saw the patient originally in fall 2023 for finding on CT of venous congestion of the left adnexa which was thought to be causing left lower quadrant/hip/flank pain Her findings are highly unlikely to be causing the intense pain the patient has been experiencing She does however have Grade 2 uterine prolapse that is causing a separate pelvic pressure discomfort I had her have a thorough work up with orthopedics and they have stated she has no hip or back findings  After long and multiple counselling encounters, we will go forward with the robot assist LH + BSO which she understands I doubt will resolve her more intense pain I will also do a vaginal vault suspension to the uterosacral ligaments to diminish vault prolapse in the years to come  PMH:    Past Medical History:  Diagnosis Date   Anxiety    COPD (chronic obstructive pulmonary disease) (HCC)    Dysrhythmia    GERD (gastroesophageal reflux disease)    Hypercholesterolemia    Pre-diabetes    Reactive airway disease    Tachycardia     PSH:     Past Surgical History:  Procedure Laterality Date   CATARACT EXTRACTION W/PHACO Left 01/17/2017   Procedure: CATARACT EXTRACTION PHACO AND INTRAOCULAR LENS PLACEMENT (IOC);  Surgeon: Gemma Payor, MD;  Location: [facility];  Service: Ophthalmology;  Laterality: Left;  CDE: 6.92   CATARACT EXTRACTION W/PHACO Right 03/07/2020   Procedure: CATARACT EXTRACTION PHACO AND INTRAOCULAR LENS PLACEMENT RIGHT EYE;  Surgeon: Fabio Pierce, MD;  Location: [facility];  Service: Ophthalmology;  Laterality: Right;  CDE: 8.44   COLONOSCOPY  2013   RMR: Normal rectum. single diminutive sigmoid polyp noted above. Normal terminal ileum. Status post segmental biopsy.  next TCS in 10 years.    COLONOSCOPY WITH PROPOFOL N/A 01/27/2022   Procedure: COLONOSCOPY WITH PROPOFOL;  Surgeon: Corbin Ade, MD;  Location: [facility];  Service: Endoscopy;  Laterality: N/A;  12:45pm   ELBOW SURGERY Right 2023   ESOPHAGOGASTRODUODENOSCOPY  2013   RMR: Possible cervical esophageal web-status post dilation as described above. Small hiatal hernia. Status post biopsy of normal appearing small bowel to screen for celiac disease.    ESOPHAGOGASTRODUODENOSCOPY N/A 05/06/2016   normal esophagus, small hiatal hernia, empiric dilation.   ESOPHAGOGASTRODUODENOSCOPY (EGD) WITH PROPOFOL N/A 01/27/2022   Procedure: ESOPHAGOGASTRODUODENOSCOPY (EGD) WITH PROPOFOL;  Surgeon: Corbin Ade, MD;  Location: [facility];  Service: Endoscopy;  Laterality: N/A;   EYE SURGERY Bilateral    Cat Sx - Symfony MF IOLs   MALONEY DILATION N/A 05/06/2016   Procedure: MALONEY DILATION;  Surgeon: Corbin Ade, MD;  Location: [facility];  Service: Endoscopy;  Laterality: N/A;   MALONEY DILATION N/A 01/27/2022   Procedure: Elease Hashimoto DILATION;  Surgeon: Corbin Ade, MD;  Location: [facility];  Service: Endoscopy;  Laterality: N/A;   NECK SURGERY  10/2012   OVARIAN CYST SURGERY  1997   removed   POLYPECTOMY  01/27/2022   Procedure: POLYPECTOMY;  Surgeon: Corbin Ade, MD;  Location: [facility];  Service: Endoscopy;;  cecal   TUBAL LIGATION  2000   YAG LASER APPLICATION Bilateral     POb/GynH:      OB History  Gravida  3   Para  2   Term  2   Preterm      AB  1   Living  2      SAB      IAB      Ectopic      Multiple      Live Births              SH:   Social History   Tobacco Use   Smoking status: Every Day    Packs/day: 1.00    Years: 25.00    Additional pack years: 0.00    Total pack years: 25.00    Types: Cigarettes   Smokeless tobacco: Never  Vaping Use   Vaping Use: Never used  Substance Use Topics   Alcohol use: No     Alcohol/week: 0.0 standard drinks of alcohol   Drug use: No    FH:    Family History  Problem Relation Age of Onset   Colon cancer Maternal Grandfather 79   Colon polyps Mother 60   Colon cancer Maternal Uncle 49   Hypertension Father    Diabetes Father    Heart disease Father    Hyperlipidemia Father    Stroke Father      Allergies:  Allergies  Allergen Reactions   Ciprofloxacin Other (See Comments)    Patient states with in five minutes of taking developed a splitting headache,itching all over and burning in mouth and lips.   Augmentin [Amoxicillin-Pot Clavulanate] Diarrhea   Carafate [Sucralfate]     Mouth tingling   Celexa [Citalopram Hydrobromide] Nausea And Vomiting   Chantix [Varenicline] Nausea And Vomiting   Sulfa Antibiotics Itching   Sulfur Itching   Wellbutrin [Bupropion]     Severe weight loss and loss of appetite   Fosamax [Alendronate Sodium] Nausea And Vomiting    GERD can not tolerate   Gabapentin     Sleepy and did not help    Medications:       Current Facility-Administered Medications:    bupivacaine liposome (EXPAREL) 1.3 % injection 266 mg, 20 mL, Infiltration, Once, Dallon Dacosta, Amaryllis Dyke, MD   ceFAZolin (ANCEF) IVPB 2g/100 mL premix, 2 g, Intravenous, On Call to OR, Lazaro Arms, MD   ketorolac (TORADOL) 30 MG/ML injection 30 mg, 30 mg, Intravenous, Once, Cass Vandermeulen, Amaryllis Dyke, MD   lactated ringers infusion, , Intravenous, Continuous, Kiel, Mosetta Putt, MD  Review of Systems:   Review of Systems  Constitutional: Negative for fever, chills, weight loss, malaise/fatigue and diaphoresis.  HENT: Negative for hearing loss, ear pain, nosebleeds, congestion, sore throat, neck pain, tinnitus and ear discharge.   Eyes: Negative for blurred vision, double vision, photophobia, pain, discharge and redness.  Respiratory: Negative for cough, hemoptysis, sputum production, shortness of breath, wheezing and stridor.   Cardiovascular: Negative for chest pain,  palpitations, orthopnea, claudication, leg swelling and PND.  Gastrointestinal: Positive for abdominal pain. Negative for heartburn, nausea, vomiting, diarrhea, constipation, blood in stool and melena.  Genitourinary: Negative for dysuria, urgency, frequency, hematuria and flank pain.  Musculoskeletal: Negative for myalgias, back pain, joint pain and falls.  Skin: Negative for itching and rash.  Neurological: Negative for dizziness, tingling, tremors, sensory change, speech change, focal weakness, seizures, loss of consciousness, weakness and headaches.  Endo/Heme/Allergies: Negative for environmental allergies and polydipsia. Does not bruise/bleed easily.  Psychiatric/Behavioral: Negative for depression, suicidal ideas, hallucinations, memory loss and substance abuse. The patient is not nervous/anxious and does not have insomnia.  PHYSICAL EXAM:  Blood pressure (!) 148/75, pulse 72, temperature 98.3 F (36.8 C), resp. rate 20, SpO2 100 %.    Vitals reviewed. Constitutional: She is oriented to person, place, and time. She appears well-developed and well-nourished.  HENT:  Head: Normocephalic and atraumatic.  Right Ear: External ear normal.  Left Ear: External ear normal.  Nose: Nose normal.  Mouth/Throat: Oropharynx is clear and moist.  Eyes: Conjunctivae and EOM are normal. Pupils are equal, round, and reactive to light. Right eye exhibits no discharge. Left eye exhibits no discharge. No scleral icterus.  Neck: Normal range of motion. Neck supple. No tracheal deviation present. No thyromegaly present.  Cardiovascular: Normal rate, regular rhythm, normal heart sounds and intact distal pulses.  Exam reveals no gallop and no friction rub.   No murmur heard. Respiratory: Effort normal and breath sounds normal. No respiratory distress. She has no wheezes. She has no rales. She exhibits no tenderness.  GI: Soft. Bowel sounds are normal. She exhibits no distension and no mass. There is  tenderness. There is no rebound and no guarding.  Genitourinary:       Vulva is normal without lesions Vagina is pink moist without discharge Cervix normal in appearance and pap is normal Uterus is grade 2 prolapse, no significant cystocoele or rectocoele is noted Adnexa is negative with normal sized ovaries by sonogram  Musculoskeletal: Normal range of motion. She exhibits no edema and no tenderness.  Neurological: She is alert and oriented to person, place, and time. She has normal reflexes. She displays normal reflexes. No cranial nerve deficit. She exhibits normal muscle tone. Coordination normal.  Skin: Skin is warm and dry. No rash noted. No erythema. No pallor.  Psychiatric: She has a normal mood and affect. Her behavior is normal. Judgment and thought content normal.    Labs: Results for orders placed or performed during the hospital encounter of 12/14/22 (from the past 336 hour(s))  Glucose, capillary   Collection Time: 12/14/22  6:38 AM  Result Value Ref Range   Glucose-Capillary 68 (L) 70 - 99 mg/dL  Results for orders placed or performed during the hospital encounter of 12/10/22 (from the past 336 hour(s))  Hemoglobin A1c   Collection Time: 12/10/22  3:19 PM  Result Value Ref Range   Hgb A1c MFr Bld 5.7 (H) 4.8 - 5.6 %   Mean Plasma Glucose 116.89 mg/dL  CBC   Collection Time: 12/10/22  3:19 PM  Result Value Ref Range   WBC 8.0 4.0 - 10.5 K/uL   RBC 3.96 3.87 - 5.11 MIL/uL   Hemoglobin 12.0 12.0 - 15.0 g/dL   HCT 16.1 09.6 - 04.5 %   MCV 92.9 80.0 - 100.0 fL   MCH 30.3 26.0 - 34.0 pg   MCHC 32.6 30.0 - 36.0 g/dL   RDW 40.9 81.1 - 91.4 %   Platelets 256 150 - 400 K/uL   nRBC 0.0 0.0 - 0.2 %  Comprehensive metabolic panel   Collection Time: 12/10/22  3:19 PM  Result Value Ref Range   Sodium 135 135 - 145 mmol/L   Potassium 3.5 3.5 - 5.1 mmol/L   Chloride 103 98 - 111 mmol/L   CO2 23 22 - 32 mmol/L   Glucose, Bld 111 (H) 70 - 99 mg/dL   BUN 16 6 - 20 mg/dL    Creatinine, Ser 7.82 0.44 - 1.00 mg/dL   Calcium 9.1 8.9 - 95.6 mg/dL   Total Protein 6.4 (L) 6.5 - 8.1 g/dL  Albumin 4.0 3.5 - 5.0 g/dL   AST 19 15 - 41 U/L   ALT 14 0 - 44 U/L   Alkaline Phosphatase 46 38 - 126 U/L   Total Bilirubin 0.5 0.3 - 1.2 mg/dL   GFR, Estimated >16 >10 mL/min   Anion gap 9 5 - 15  Rapid HIV screen (HIV 1/2 Ab+Ag)   Collection Time: 12/10/22  3:19 PM  Result Value Ref Range   HIV-1 P24 Antigen - HIV24 NON REACTIVE NON REACTIVE   HIV 1/2 Antibodies NON REACTIVE NON REACTIVE   Interpretation (HIV Ag Ab)      A non reactive test result means that HIV 1 or HIV 2 antibodies and HIV 1 p24 antigen were not detected in the specimen.  Urinalysis, Routine w reflex microscopic -Urine, Clean Catch   Collection Time: 12/10/22  3:19 PM  Result Value Ref Range   Color, Urine YELLOW YELLOW   APPearance HAZY (A) CLEAR   Specific Gravity, Urine 1.026 1.005 - 1.030   pH 5.0 5.0 - 8.0   Glucose, UA NEGATIVE NEGATIVE mg/dL   Hgb urine dipstick NEGATIVE NEGATIVE   Bilirubin Urine NEGATIVE NEGATIVE   Ketones, ur NEGATIVE NEGATIVE mg/dL   Protein, ur NEGATIVE NEGATIVE mg/dL   Nitrite NEGATIVE NEGATIVE   Leukocytes,Ua NEGATIVE NEGATIVE  Type and screen   Collection Time: 12/10/22  3:19 PM  Result Value Ref Range   ABO/RH(D) O POS    Antibody Screen NEG    Sample Expiration 12/24/2022,2359    Extend sample reason      NO TRANSFUSIONS OR PREGNANCY IN THE PAST 3 MONTHS Performed at [facility], [address], [place], [place] [address]     EKG: Orders placed or performed in visit on 08/17/22   EKG 12-Lead    Imaging Studies: DG MYELOGRAPHY LUMBAR INJ LUMBOSACRAL  Result Date: 11/01/2022 CLINICAL DATA:  Buttock and sacral region pain particularly with sitting. Some radiation to the left. EXAM: LUMBAR MYELOGRAM FLUOROSCOPY: 0 minutes 44 seconds.  129.00 micro gray meter squared PROCEDURE: After thorough discussion of risks and benefits of the procedure including  bleeding, infection, injury to nerves, blood vessels, adjacent structures as well as headache and CSF leak, written and oral informed consent was obtained. Consent was obtained by Dr. Paulina Fusi. Time out form was completed. Patient was positioned prone on the fluoroscopy table. Local anesthesia was provided with 1% lidocaine without epinephrine after prepped and draped in the usual sterile fashion. Puncture was performed at L4-5 using a 3 1/2 inch 22-gauge spinal needle via right paramedian approach. Using a single pass through the dura, the needle was placed within the thecal sac, with return of clear CSF. 15 mL of Isovue M-200 was injected into the thecal sac, with normal opacification of the nerve roots and cauda equina consistent with free flow within the subarachnoid space. I personally performed the lumbar puncture and administered the intrathecal contrast. I also personally performed acquisition of the myelogram images. TECHNIQUE: Contiguous axial images were obtained through the Lumbar spine after the intrathecal infusion of infusion. Coronal and sagittal reconstructions were obtained of the axial image sets. COMPARISON:  MRI 07/30/2022 FINDINGS: LUMBAR MYELOGRAM FINDINGS: No central canal stenosis. No significant extradural defect. No evidence of focal neural compression. Standing flexion extension views do not show any subluxation or abnormal rocking motion. CT LUMBAR MYELOGRAM FINDINGS: Mild scoliotic curvature convex to the left. No antero or retrolisthesis. No disc level abnormality from T11-12 through L2-3. L3-4: Very minimal disc bulge.  No canal or foraminal stenosis. No facet arthropathy. L4-5: Minimal disc bulge. Minimal facet and ligamentous hypertrophy. No compressive narrowing of the canal or foramina. L5-S1: Minimal disc bulge. Minimal facet and ligamentous prominence. No compressive narrowing of the canal or foramina. Minimal bilateral sacroiliac degenerative changes. No other sacral finding.  Atherosclerotic calcification of the aorta and its branch vessels incidentally noted. Nonobstructing 2 mm stone in the lower pole the right kidney incidentally noted. IMPRESSION: 1. Mild scoliotic curvature convex to the left. No antero or retrolisthesis. No abnormal rocking motion. 2. Minimal disc bulges at L3-4, L4-5 and L5-S1. No compressive narrowing of the canal or foramina. 3. Minimal bilateral sacroiliac degenerative changes. 4. Atherosclerotic calcification of the aorta and its branch vessels. 5. Nonobstructing 2 mm stone in the lower pole the right kidney. Aortic Atherosclerosis (ICD10-I70.0). Electronically Signed   By: Paulina Fusi M.D.   On: 11/01/2022 14:54   CT LUMBAR SPINE W CONTRAST  Result Date: 11/01/2022 CLINICAL DATA:  Buttock and sacral region pain particularly with sitting. Some radiation to the left. EXAM: LUMBAR MYELOGRAM FLUOROSCOPY: 0 minutes 44 seconds.  129.00 micro gray meter squared PROCEDURE: After thorough discussion of risks and benefits of the procedure including bleeding, infection, injury to nerves, blood vessels, adjacent structures as well as headache and CSF leak, written and oral informed consent was obtained. Consent was obtained by Dr. Paulina Fusi. Time out form was completed. Patient was positioned prone on the fluoroscopy table. Local anesthesia was provided with 1% lidocaine without epinephrine after prepped and draped in the usual sterile fashion. Puncture was performed at L4-5 using a 3 1/2 inch 22-gauge spinal needle via right paramedian approach. Using a single pass through the dura, the needle was placed within the thecal sac, with return of clear CSF. 15 mL of Isovue M-200 was injected into the thecal sac, with normal opacification of the nerve roots and cauda equina consistent with free flow within the subarachnoid space. I personally performed the lumbar puncture and administered the intrathecal contrast. I also personally performed acquisition of the  myelogram images. TECHNIQUE: Contiguous axial images were obtained through the Lumbar spine after the intrathecal infusion of infusion. Coronal and sagittal reconstructions were obtained of the axial image sets. COMPARISON:  MRI 07/30/2022 FINDINGS: LUMBAR MYELOGRAM FINDINGS: No central canal stenosis. No significant extradural defect. No evidence of focal neural compression. Standing flexion extension views do not show any subluxation or abnormal rocking motion. CT LUMBAR MYELOGRAM FINDINGS: Mild scoliotic curvature convex to the left. No antero or retrolisthesis. No disc level abnormality from T11-12 through L2-3. L3-4: Very minimal disc bulge. No canal or foraminal stenosis. No facet arthropathy. L4-5: Minimal disc bulge. Minimal facet and ligamentous hypertrophy. No compressive narrowing of the canal or foramina. L5-S1: Minimal disc bulge. Minimal facet and ligamentous prominence. No compressive narrowing of the canal or foramina. Minimal bilateral sacroiliac degenerative changes. No other sacral finding. Atherosclerotic calcification of the aorta and its branch vessels incidentally noted. Nonobstructing 2 mm stone in the lower pole the right kidney incidentally noted. IMPRESSION: 1. Mild scoliotic curvature convex to the left. No antero or retrolisthesis. No abnormal rocking motion. 2. Minimal disc bulges at L3-4, L4-5 and L5-S1. No compressive narrowing of the canal or foramina. 3. Minimal bilateral sacroiliac degenerative changes. 4. Atherosclerotic calcification of the aorta and its branch vessels. 5. Nonobstructing 2 mm stone in the lower pole the right kidney. Aortic Atherosclerosis (ICD10-I70.0). Electronically Signed   By: Paulina Fusi M.D.   On: 11/01/2022 14:54  DG Bone Density  Result Date: 10/13/2022 EXAM: DUAL X-RAY ABSORPTIOMETRY (DXA) FOR BONE MINERAL DENSITY IMPRESSION: Your patient Isabel Atkinson completed a BMD test on 10/13/2022 using the [employer] DXA System (software version: 14.10)  manufactured by [employer]. The following summarizes the results of our evaluation. Technologist:[facility] PATIENT BIOGRAPHICAL: Name: Isabel Atkinson, Isabel Atkinson Patient ID: [id number] Birth Date: 1966/03/18 Height: 62.0 in. Gender: Female Exam Date: 10/13/2022 Weight: 99.4 lbs. Indications: Caucasian, Early Menopause, Follow up Osteoporosis, Low Body Weight, Post Menopausal, Tobacco User Fractures: Toe Treatments: Asprin, Calcium, Multivitamin, Prolia, Vitamin D DENSITOMETRY RESULTS: Site      Region     Measured Date Measured Age WHO Classification Young Adult T-score BMD         %Change vs. Previous Significant Change (*) AP Spine L1-L4 (L2) 10/13/2020 58.4 Osteopenia -2.4 0.883 g/cm2 6.5% Yes AP Spine L1-L4 (L2) 10/08/2020 56.4 Osteoporosis -2.8 0.829 g/cm2 -9.5% Yes AP Spine L1-L4 (L2) 06/26/2015 51.1 Osteopenia -2.1 0.916 g/cm2 4.2% Yes AP Spine L1-L4 (L2) 05/18/2013 49.0 Osteopenia -2.4 0.879 g/cm2 - - DualFemur Neck Right 10/13/2022 58.4 Osteopenia -2.4 0.708 g/cm2 7.4% Yes DualFemur Neck Right 10/08/2020 56.4 Osteoporosis -2.7 0.659 g/cm2 -7.7% Yes DualFemur Neck Right 06/26/2015 51.1 Osteopenia -2.3 0.714 g/cm2 4.7% - DualFemur Neck Right 05/18/2013 49.0 Osteoporosis -2.6 0.682 g/cm2 - - DualFemur Total Mean 10/13/2022 58.4 Osteopenia -1.9 0.765 g/cm2 5.4% Yes DualFemur Total Mean 10/08/2020 56.4 Osteopenia -2.2 0.726 g/cm2 -9.5% Yes DualFemur Total Mean 06/26/2015 51.1 Osteopenia -1.6 0.802 g/cm2 3.9% Yes DualFemur Total Mean 05/18/2013 49.0 Osteopenia -1.9 0.772 g/cm2 - - ASSESSMENT: The BMD measured at Femur Neck Right is 0.708 g/cm2 with a T-score of -2.4. This patient is considered osteopenic according to World Health Organization [facility]) criteria. The scan quality is good. Compared with the prior study on 10/08/20 , the BMD of the total mean and ap spine show a statistically significant increase. L2 was excluded due to advanced degenerative changes. Patient is not a candidate for FRAX assessment due to  being on Prolia. World [employer] writer [facility]) criteria for post-menopausal, Caucasian Women: Normal:       T-score at or above -1 SD Osteopenia:   T-score between -1 and -2.5 SD Osteoporosis: T-score at or below -2.5 SD RECOMMENDATIONS: 1. All patients should optimize calcium and vitamin D intake. 2. Consider FDA-approved medical therapies in postmenopausal women and med aged 65 years and older, based on the following: a. A hip or vertebral (clinical or morphometric) fracture b. T-score< -2.5 at the femoral neck or spine after appropriate evaluation to exclude secondary causes c. Low bone mass (T-score between -1.0 and -2.5 at the femoral neck or spine) and a 10-year probability of a hip fracture > 3% or a 10-year probability of a major osteoporosis-related fracture > 20% based on the [place]-adapted WHO algorithm d. Clinician judgment and/or patient preferences may indicate treatment for people with 10-year fracture probabilities above or below these levels FOLLOW-UP: Patients with diagnosis of osteoporosis or at high risk fracture should have regular bone mineral density tests. For patients eligible for Medicare, routine testing is allowed once every 2 years. Testing frequency can be increased to on year for patients who have rapidly progressing disease, those who are receiving medical therapy to restore bone mass, or have additional risk factors. I have reviewed this report, and agree with the above findings. [facility] Electronically Signed   By: Bary Richard M.D.   On: 10/13/2022 13:33   OCT, Retina - OU - Both  Eyes  Result Date: 09/24/2022 Right Eye Quality was good. Central Foveal Thickness: 284. Progression has improved. Findings include normal foveal contour, no IRF, no SRF, epiretinal membrane (Stable release of central VMT to partial PVD, interval improvement in central macular cyst; mild ERM). Left Eye Quality was good. Central Foveal Thickness: 289. Progression has been stable.  Findings include normal foveal contour, no IRF, no SRF (Partial PVD). Notes *Images captured and stored on drive Diagnosis / Impression: OD: Stable release of central VMT to partial PVD, interval improvement in central macular cyst; mild ERM OS: NFP, no IRF/SRF Clinical management: See below Abbreviations: NFP - Normal foveal profile. CME - cystoid macular edema. PED - pigment epithelial detachment. IRF - intraretinal fluid. SRF - subretinal fluid. EZ - ellipsoid zone. ERM - epiretinal membrane. ORA - outer retinal atrophy. ORT - outer retinal tubulation. SRHM - subretinal hyper-reflective material. IRHM - intraretinal hyper-reflective material      Assessment: Grade 2 uterine prolapse with pelvic pressure Venous congestion left adnexa vasculature Left LQ/hip/flank pain ? Etiology: orthopedic work up has been negative  Plan: Veterinary surgeon laparoscopic hysterectomy with removal of both tubes and ovaries with vaginal vault suspension  Pt understands the risks of surgery including but not limited t  excessive bleeding requiring transfusion or reoperation, post-operative infection requiring prolonged hospitalization or re-hospitalization and antibiotic therapy, and damage to other organs including bladder, bowel, ureters and major vessels.  The patient also understands the alternative treatment options which were discussed in full.  All questions were answered.  Lazaro Arms 12/14/2022 7:05 AM   Lazaro Arms 12/14/2022 7:03 AM

## 2022-12-14 NOTE — Transfer of Care (Signed)
Immediate Anesthesia Transfer of Care Note  Patient: Isabel Atkinson  Procedure(s) Performed: XI ROBOTIC ASSISTED LAPAROSCOPIC HYSTERECTOMY AND SALPINGO-OOPHORECTOMY (Bilateral: Abdomen) VAGINAL VAULT SUSPENSION (Vagina )  Patient Location: PACU  Anesthesia Type:General  Level of Consciousness: awake  Airway & Oxygen Therapy: Patient Spontanous Breathing and Patient connected to face mask oxygen  Post-op Assessment: Report given to RN and Post -op Vital signs reviewed and stable  Post vital signs: Reviewed and stable  Last Vitals:  Vitals Value Taken Time  BP 160/91 12/14/22 1021  Temp    Pulse 112 12/14/22 1022  Resp 17 12/14/22 1022  SpO2 100 % 12/14/22 1022  Vitals shown include unvalidated device data.  Last Pain:  Vitals:   12/14/22 0646  PainSc: 0-No pain      Patients Stated Pain Goal: 7 (12/14/22 0646)  Complications: No notable events documented.

## 2022-12-14 NOTE — Anesthesia Preprocedure Evaluation (Addendum)
Anesthesia Evaluation  Patient identified by MRN, date of birth, ID band Patient awake    Reviewed: Allergy & Precautions, H&P , NPO status , Patient's Chart, lab work & pertinent test results, reviewed documented beta blocker date and time   Airway Mallampati: II  TM Distance: >3 FB Neck ROM: full    Dental  (+) Dental Advisory Given, Chipped,    Pulmonary COPD, Current Smoker   Pulmonary exam normal breath sounds clear to auscultation       Cardiovascular Exercise Tolerance: Good + CAD  negative cardio ROS + dysrhythmias  Rhythm:regular Rate:Normal     Neuro/Psych   Anxiety     negative neurological ROS  negative psych ROS   GI/Hepatic negative GI ROS, Neg liver ROS,GERD  ,,  Endo/Other  negative endocrine ROS    Renal/GU negative Renal ROS  negative genitourinary   Musculoskeletal   Abdominal   Peds  Hematology negative hematology ROS (+)   Anesthesia Other Findings   Reproductive/Obstetrics negative OB ROS                             Anesthesia Physical Anesthesia Plan  ASA: 3  Anesthesia Plan: General and General ETT   Post-op Pain Management:    Induction:   PONV Risk Score and Plan: Scopolamine patch - Pre-op and Ondansetron  Airway Management Planned:   Additional Equipment:   Intra-op Plan:   Post-operative Plan:   Informed Consent: I have reviewed the patients History and Physical, chart, labs and discussed the procedure including the risks, benefits and alternatives for the proposed anesthesia with the patient or authorized representative who has indicated his/her understanding and acceptance.     Dental Advisory Given  Plan Discussed with: CRNA  Anesthesia Plan Comments:        Anesthesia Quick Evaluation

## 2022-12-14 NOTE — Op Note (Signed)
Preoperative diagnosis:  Grade 2 uterine prolapse Pelvic pressure Venous congestion left adnexal vasculature  Postoperative diagnosis: SAA  Procedure: Xi Robotic hysterectomy with removal of both tubes and ovaries with vaginal vault suspension  Surgeon: Lazaro Arms, MD  Anesthesia: General endotracheal  Findings: Uterus tubes and ovaries normal Grade 2 prolapse confirmed Minimal vascular congestion of left infundibulopelvic vasculature  Description of operation: Patient was taken to the operating room and placed in the low lithotomy position She was prepped and draped in the usual sterile fashion robotic assisted laparoscopic procedure A Foley catheter was placed The vagina was once again prepped additionally  A small Vcare was placed for uterine manipulation and colpotomy delineation  An incision was made above the umbilicus The umbilical fascia was grasped A varies needle was used and placed into the peritoneum with 1 pass A pneumoperitoneum was created to a pressure of 15  An 8 mm port was placed into the peritoneal cavity using a nonbladed trocar easily with 1 pass using the video laparoscope The peritoneal cavity was confirmed  There were no unusual findings 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  3 additional 8 mm ports were placed at approximately the same level as the supraumbilical port 2 of the ports were robotic and 1 is an air seal assist port  One was left lateral and 1 was placed right lateral, both lateral to the rectus anterior muscle These were placed under direct visualization without difficulty into the peritoneal cavity Nonbladed trocars were used in all instances The air seal assist port was placed 8 cm to the left of the umbilical incision and 3 cm above the umbilical incision  The patient was placed in 24 degrees of Trendelenburg  The [employer] robot was then docked to the 3 robotic  ports  Instruments used during the robotic hysterectomy: Vessel sealer extender using bipolar energy ProGrasp forceps with no energy Monopolar hook Megacut needle driver 2-0 PDS symmetrical STRATAFIX on a CT 2 needle  I then left the patient and went to the surgical console  The Vcare was used throughout the case to apply traction and anterior/posterior displacement of the uterus to facilitate the robotic procedure The ProGrasp was used to put traction on the left adnexa The left ureter was identified and found to be well away from the adnexal vessels The vessel sealer extender using bipolar energy was used and the utero-ovarian ligament was coagulated and then transected I used traction medially and anteriorly and used the vessel sealer to take the broad ligament and round ligament down to the level of the cervical isthmus just lateral to the left uterine vessels  I then turned my attention to the right adnexa The pro grasp was used and medial and upward traction was placed The vessel sealer extender using bipolar energy was used to coagulate and ligate the right infundibulopelvic ligament vessels Again the right ureter was well inferior to the vessels I continued use medial and anterior retraction using the ProGrasp and the vessel sealer with the bipolar energy was used to take the broad ligament and round ligament down to the level of the cervical isthmus and lateral to the uterine vessels  I then placed the monopolar hook in the place of the ProGrasp The vessel sealer extender was used to grasp the peritoneum of the bladder provide traction, of course no energy was used for this I used the monopolar hook with energy to open of the peritoneal leaf  anteriorly and dissect the bladder peritoneum off the lower uterine segment and cervix beyond the level of the vaginal colpotomy cup I then used the monopolar hook to take the peritoneum down where I stopped using the vessel sealer and met in  the bladder dissection bilaterally  The vessel sealer extender with monopolar energy was used to coagulate the uterine vessels at the level of the cervical isthmus bilaterally and then just above and just below to manage backbleeding The uterine vessels were transected There was good hemostasis I then stayed medial to this uterine vessel pedicle with the vessel sealer extender and took down the paracervical tissue to allow colpotomy incision using the monopolar hook, preserving the cardinal ligament Again there was good hemostasis bilaterally  I then used the monopolar hook and made a posterior colpotomy incision above the level of insertion of the uterosacral ligaments I used a frowny face then smiley face technique and opened the vagina to approximately 8:00 and 4:00 posteriorly I then used the vessel sealer extender with bipolar energy, coagulating and then transecting the vagina  The monopolar hook was used to make the anterior colpotomy incision as the bladder had been dissected well past the colpotomy ring Cephalad tension was placed on the Vcare handle throughout this portion of the procedure to prevent thermal injury to the bladder and safe distance from the ureters bilaterally Colpotomy incision was made from 10:00 to 2:00 The vessel sealer with bipolar energy was then used to coagulate and transect the vagina, again inside of the cardinal ligament  The uterus was removed through the vagina The tubes and ovaries were also removed through the vagina A wet lap tape was placed in the vagina to maintain pneumoperitoneum  All pedicles were found to be hemostatic there was very little blood loss I then removed the vessel sealer and monopolar hook The pro grasp was replaced and the needle driver was also placed along with the suture  The vagina was closed using the 2-0 PDS STRATAFIX symmetrical suture on the CT 2 needle I pay close attention to the vaginal corners bilaterally and  incorporated those in the closure 1.5 cm of vagina was operating to the vaginal closure I also fixed the uterosacral ligaments to the vaginal cuff repair shortening the uterosacral ligaments thus providing improved vaginal vault suspension The cardinal ligaments were intact again improving postoperative vaginal support Pubocervical rectovaginal and posterior peritoneum were all included in the vaginal closure There was good result hemostasis  At the end of the procedure all the pedicles were identified and found to be hemostatic Both ureters were identified and undergoing normal peristalsis, they were well out of the way of surgical field throughout  I then got up from the console and went back to the side of the patient after gowning and gloving sterilely  The 3 robotic ports were removed The air seal port was used to actively desufflate the peritoneal cavity to a pressure of 0 Air seal port was then removed  The subcutaneous tissue of all 4 incisions was closed using 0 Vicryl All 4 skin incisions were closed using 300 in a subcuticular manner Dermabond was used all 4 incision sites 5 cc of Exparel was injected at each incision site Each incision was dressed  The patient tolerated the procedure well She received 2 g of Ancef and 30 mg of Toradol preoperatively  EBL: 10 cc Console time: 65 minutes with 55 minutes active instruments Total operative time: 2 hours approx  Lazaro Arms,  MD 12/14/2022 10:54 AM

## 2022-12-14 NOTE — Telephone Encounter (Signed)
Tried to call pt, did not receive an answer and was unable to leave a message. 

## 2022-12-15 ENCOUNTER — Encounter: Payer: Self-pay | Admitting: Obstetrics & Gynecology

## 2022-12-16 LAB — SURGICAL PATHOLOGY

## 2022-12-17 NOTE — Anesthesia Postprocedure Evaluation (Signed)
Anesthesia Post Note  Patient: Isabel Atkinson  Procedure(s) Performed: XI ROBOTIC ASSISTED LAPAROSCOPIC HYSTERECTOMY AND SALPINGO-OOPHORECTOMY (Bilateral: Abdomen) VAGINAL VAULT SUSPENSION (Vagina )  Patient location during evaluation: Phase II Anesthesia Type: General Level of consciousness: awake Pain management: pain level controlled Vital Signs Assessment: post-procedure vital signs reviewed and stable Respiratory status: spontaneous breathing and respiratory function stable Cardiovascular status: blood pressure returned to baseline and stable Postop Assessment: no headache and no apparent nausea or vomiting Anesthetic complications: no Comments: Late entry   No notable events documented.   Last Vitals:  Vitals:   12/14/22 1100 12/14/22 1115  BP:  (!) 158/76  Pulse: 83 79  Resp: (!) 25   Temp:  (!) 36.4 C  SpO2: 99% 99%    Last Pain:  Vitals:   12/15/22 1339  TempSrc:   PainSc: 1                  Windell Norfolk

## 2022-12-20 ENCOUNTER — Encounter (HOSPITAL_COMMUNITY): Payer: Self-pay | Admitting: Obstetrics & Gynecology

## 2022-12-20 ENCOUNTER — Encounter: Payer: Self-pay | Admitting: Obstetrics & Gynecology

## 2022-12-27 ENCOUNTER — Ambulatory Visit (INDEPENDENT_AMBULATORY_CARE_PROVIDER_SITE_OTHER): Payer: BC Managed Care – PPO | Admitting: Obstetrics & Gynecology

## 2022-12-27 ENCOUNTER — Encounter: Payer: Self-pay | Admitting: Obstetrics & Gynecology

## 2022-12-27 VITALS — BP 129/80 | HR 84 | Ht 62.0 in | Wt 93.0 lb

## 2022-12-27 DIAGNOSIS — Z9889 Other specified postprocedural states: Secondary | ICD-10-CM

## 2022-12-27 DIAGNOSIS — R300 Dysuria: Secondary | ICD-10-CM | POA: Diagnosis not present

## 2022-12-27 LAB — POCT URINALYSIS DIPSTICK OB
Glucose, UA: NEGATIVE
Ketones, UA: NEGATIVE
Leukocytes, UA: NEGATIVE
Nitrite, UA: NEGATIVE
POC,PROTEIN,UA: NEGATIVE

## 2022-12-27 NOTE — Progress Notes (Signed)
HPI: Patient returns for routine postoperative follow-up having undergone robot assisted TLH/BSO, vv suspension on 12/14/22.  The patient's immediate postoperative recovery has been unremarkable. Since hospital discharge the patient reports doing great no direct surgical issues.   Current Outpatient Medications: albuterol (VENTOLIN HFA) 108 (90 Base) MCG/ACT inhaler, Inhale 2 puffs into the lungs every 4 (four) hours as needed for wheezing or shortness of breath., Disp: 8 g, Rfl: 0 ALPRAZolam (XANAX) 0.25 MG tablet, TAKE 1/2 TO 1 TABLET BY MOUTH TWICE DAILY AS NEEDED FOR ANXIETY., Disp: 30 tablet, Rfl: 0 aspirin EC 81 MG tablet, Take 81 mg by mouth daily. Swallow whole., Disp: , Rfl:  Bromfenac Sodium (PROLENSA) 0.07 % SOLN, Place 1 drop into the right eye daily., Disp: 3 mL, Rfl: 10 CRANBERRY PO, Take 1 tablet by mouth daily. Gummy, Disp: , Rfl:  cycloSPORINE (RESTASIS) 0.05 % ophthalmic emulsion, Place 1 drop into both eyes 2 (two) times daily., Disp: , Rfl:  denosumab (PROLIA) 60 MG/ML SOSY injection, Inject 60 mg into the skin every 6 (six) months., Disp: 1 mL, Rfl: 1 EPINEPHRINE 0.3 mg/0.3 mL IJ SOAJ injection, INJECT INTO OUTER THIGH AND HOLD AGAINST LEG FOR 10 SECONDS FOR SEVERE ALLERGIC REACTION., Disp: 2 each, Rfl: 3 Misc Natural Products (ELDERBERRY IMMUNE COMPLEX PO), Take 1 tablet by mouth daily. Gummy, Disp: , Rfl:  Multiple Vitamins-Minerals (MULTIVITAMIN WITH MINERALS) tablet, Take 1 tablet by mouth daily. , Disp: , Rfl:  omeprazole (PRILOSEC) 40 MG capsule, Take 1 capsule (40 mg total) by mouth daily as needed. (Patient taking differently: Take 40 mg by mouth daily.), Disp: 90 capsule, Rfl: 1 oxyCODONE-acetaminophen (PERCOCET) 7.5-325 MG tablet, Take 1 tablet by mouth every 6 (six) hours as needed., Disp: 28 tablet, Rfl: 0 rosuvastatin (CRESTOR) 10 MG tablet, Take 1 tablet (10 mg total) by mouth daily. (Patient taking differently: Take 10 mg by mouth 3 (three) times a week.), Disp:  90 tablet, Rfl: 3 Triamcinolone Acetonide (NASACORT ALLERGY 24HR NA), Place 2 sprays into the nose daily., Disp: , Rfl:  Vitamin D, Ergocalciferol, (DRISDOL) 1.25 MG (50000 UNIT) CAPS capsule, Take 1 capsule (50,000 Units total) by mouth every 7 (seven) days., Disp: 5 capsule, Rfl: 11 acetaminophen (TYLENOL) 500 MG tablet, Take 500 mg by mouth every 6 (six) hours as needed for moderate pain or headache. (Patient not taking: Reported on 12/27/2022), Disp: , Rfl:  estradiol (ESTRACE) 0.1 MG/GM vaginal cream, Apply a pea size amount of cream to urethral area of vagina twice weekly (Patient not taking: Reported on 12/08/2022), Disp: 42.5 g, Rfl: 3 ketorolac (TORADOL) 10 MG tablet, Take 1 tablet (10 mg total) by mouth every 8 (eight) hours as needed. (Patient not taking: Reported on 12/27/2022), Disp: 15 tablet, Rfl: 0 ondansetron (ZOFRAN-ODT) 8 MG disintegrating tablet, Take 8 mg by mouth every 8 (eight) hours as needed for nausea or vomiting. (Patient not taking: Reported on 12/27/2022), Disp: , Rfl:  ondansetron (ZOFRAN-ODT) 8 MG disintegrating tablet, Take 1 tablet (8 mg total) by mouth every 8 (eight) hours as needed for nausea or vomiting. (Patient not taking: Reported on 12/27/2022), Disp: 8 tablet, Rfl: 0 phenazopyridine (PYRIDIUM) 100 MG tablet, Take 1 tablet (100 mg total) by mouth 3 (three) times daily as needed for pain., Disp: 10 tablet, Rfl: 0  No current facility-administered medications for this visit.    Blood pressure 129/80, pulse 84, height 5\' 2"  (1.575 m), weight 93 lb (42.2 kg).   Physical Exam: 4 incisions all healing well Abdominal exam is benign Vaginal  cuff is healing appropriately, cuff is non tender and not full suture is palpable  Diagnostic Tests:   Pathology: benign  Impression + Management plan: (R30.0) Dysuria  (primary encounter diagnosis) Plan: POC Urinalysis Dipstick OB, Urine Culture  (U04.540) S/P Robot assist TLH/BSO, VV suspension Comment: doing  great     Medications Prescribed this encounter: No orders of the defined types were placed in this encounter.     Follow up: Return in about 5 weeks (around 01/31/2023) for Post Op, Follow up, with Dr Despina Hidden.    Lazaro Arms, MD Attending Physician for the Center for [facility] and [facility] 12/27/2022 10:46 AM

## 2022-12-28 ENCOUNTER — Encounter: Payer: Self-pay | Admitting: Obstetrics & Gynecology

## 2022-12-29 ENCOUNTER — Ambulatory Visit (INDEPENDENT_AMBULATORY_CARE_PROVIDER_SITE_OTHER): Payer: BC Managed Care – PPO

## 2022-12-29 VITALS — BP 118/76 | Ht 62.0 in | Wt 92.5 lb

## 2022-12-29 DIAGNOSIS — M818 Other osteoporosis without current pathological fracture: Secondary | ICD-10-CM | POA: Diagnosis not present

## 2022-12-29 MED ORDER — DENOSUMAB 60 MG/ML ~~LOC~~ SOSY
60.0000 mg | PREFILLED_SYRINGE | Freq: Once | SUBCUTANEOUS | Status: AC
Start: 2022-12-29 — End: 2022-12-29
  Administered 2022-12-29: 60 mg via SUBCUTANEOUS

## 2022-12-29 NOTE — Telephone Encounter (Signed)
Nurses-I agree it would be reasonable to see a hip doctor with [facility] please go ahead with referral  Please also send message to Zyonna so she is aware

## 2022-12-29 NOTE — Progress Notes (Signed)
Pt was seen today for a nurse visit, she did bring her prolia with her. Prolia 60mg /80mL given sq in upper R arm without difficulty. Lot # [id number], Expiration Date 02/05/2025. Pt waited 15 minutes in office waiting room, no reaction noted.

## 2023-01-06 ENCOUNTER — Ambulatory Visit: Payer: BC Managed Care – PPO | Admitting: Urology

## 2023-01-06 ENCOUNTER — Other Ambulatory Visit: Payer: Self-pay | Admitting: Family Medicine

## 2023-01-06 MED ORDER — HYDROCODONE-ACETAMINOPHEN 10-325 MG PO TABS: ORAL_TABLET | ORAL | 0 refills | Status: DC

## 2023-01-21 ENCOUNTER — Ambulatory Visit (INDEPENDENT_AMBULATORY_CARE_PROVIDER_SITE_OTHER): Payer: BC Managed Care – PPO | Admitting: Family Medicine

## 2023-01-21 ENCOUNTER — Ambulatory Visit: Payer: BC Managed Care – PPO | Admitting: Family Medicine

## 2023-01-21 ENCOUNTER — Telehealth: Payer: Self-pay | Admitting: Family Medicine

## 2023-01-21 DIAGNOSIS — I700 Atherosclerosis of aorta: Secondary | ICD-10-CM | POA: Diagnosis not present

## 2023-01-21 DIAGNOSIS — J449 Chronic obstructive pulmonary disease, unspecified: Secondary | ICD-10-CM

## 2023-01-21 NOTE — Telephone Encounter (Signed)
Patient brought in disability form to be complete in your yellow folder

## 2023-01-21 NOTE — Progress Notes (Signed)
   Subjective:    Patient ID: Isabel Atkinson, female    DOB: 1965-09-17, 57 y.o.   MRN: [id number]  HPI Patient arrives for 7 week follow up. Patient states she has sore spot in left forearm from IV placement.  Patient here for follow-up Recently had her hysterectomy Doing really well As for her back pain and radiation into the groin that is actually doing better.  She states that she is seeing gynecology in the coming weeks and they will more than likely release her to start doing activity We did discuss how she has not worked in multiple months and how her job requires her to do a lot of bending squatting lifting carrying 40 pounds etc. will be important to build her up for this.  In order to do so this will require physical therapy  Review of Systems     Objective:   Physical Exam  Positive straight leg raise on the left side.  Pain in the lower back and into the hip.  Abdomen is soft.      Assessment & Plan:  From a gynecologic standpoint she should be able to be released after her visit with Dr. Your  As for the low back and radiation into the groin this is improved but not at a point where she can return to work yet the goal is to have her return to work August 19 we will see her back in mid August She has an appointment with [facility] in a few weeks We will refer to [facility] physical therapy on [address] The goal is to have them work with her back pain and radiation to help minimize her pain and perhaps even get to the point where she has no pain Also hopefully Occupational Therapy can work with her to rehabilitate her so she is able to do her job.  I told her that she truly needs to look up on this summer as her rehabilitation and prepping to return to work  She does have COPD we have encouraged her to quit smoking Aortic atherosclerosis continue statin

## 2023-01-22 NOTE — Telephone Encounter (Signed)
Front-do you have the most recent disability form that we filled out for her?  If you can print a copy of this it would be greatly helpful Therefore I do not have to reinvent the wheel Thank you

## 2023-01-28 ENCOUNTER — Ambulatory Visit (INDEPENDENT_AMBULATORY_CARE_PROVIDER_SITE_OTHER): Payer: BC Managed Care – PPO | Admitting: Obstetrics & Gynecology

## 2023-01-28 ENCOUNTER — Encounter: Payer: Self-pay | Admitting: Obstetrics & Gynecology

## 2023-01-28 DIAGNOSIS — N952 Postmenopausal atrophic vaginitis: Secondary | ICD-10-CM

## 2023-01-28 MED ORDER — ESTRADIOL 0.1 MG/GM VA CREA
TOPICAL_CREAM | VAGINAL | 12 refills | Status: DC
Start: 2023-01-28 — End: 2023-06-06

## 2023-01-28 NOTE — Progress Notes (Unsigned)
HPI: Patient returns for routine postoperative follow-up having undergone Xi RA TLH BSO VV suspension on 12/14/22.  The patient's immediate postoperative recovery has been unremarkable. Since hospital discharge the patient reports her groin/hip pain has returned as expected No specific surgical complaints.   Current Outpatient Medications: albuterol (VENTOLIN HFA) 108 (90 Base) MCG/ACT inhaler, Inhale 2 puffs into the lungs every 4 (four) hours as needed for wheezing or shortness of breath., Disp: 8 g, Rfl: 0 ALPRAZolam (XANAX) 0.25 MG tablet, TAKE 1/2 TO 1 TABLET BY MOUTH TWICE DAILY AS NEEDED FOR ANXIETY., Disp: 30 tablet, Rfl: 0 Bromfenac Sodium (PROLENSA) 0.07 % SOLN, Place 1 drop into the right eye daily., Disp: 3 mL, Rfl: 10 CRANBERRY PO, Take 1 tablet by mouth daily. Gummy, Disp: , Rfl:  cycloSPORINE (RESTASIS) 0.05 % ophthalmic emulsion, Place 1 drop into both eyes 2 (two) times daily., Disp: , Rfl:  denosumab (PROLIA) 60 MG/ML SOSY injection, Inject 60 mg into the skin every 6 (six) months., Disp: 1 mL, Rfl: 1 estradiol (ESTRACE) 0.1 MG/GM vaginal cream, 0.5 grams at bedtime every other night, Disp: 30 g, Rfl: 12 Misc Natural Products (ELDERBERRY IMMUNE COMPLEX PO), Take 1 tablet by mouth daily. Gummy, Disp: , Rfl:  Multiple Vitamins-Minerals (MULTIVITAMIN WITH MINERALS) tablet, Take 1 tablet by mouth daily. , Disp: , Rfl:  omeprazole (PRILOSEC) 40 MG capsule, Take 1 capsule (40 mg total) by mouth daily as needed. (Patient taking differently: Take 40 mg by mouth daily.), Disp: 90 capsule, Rfl: 1 phenazopyridine (PYRIDIUM) 100 MG tablet, Take 1 tablet (100 mg total) by mouth 3 (three) times daily as needed for pain., Disp: 10 tablet, Rfl: 0 rosuvastatin (CRESTOR) 10 MG tablet, Take 1 tablet (10 mg total) by mouth daily. (Patient taking differently: Take 10 mg by mouth 3 (three) times a week.), Disp: 90 tablet, Rfl: 3 Triamcinolone Acetonide (NASACORT ALLERGY 24HR NA), Place 2 sprays into  the nose daily., Disp: , Rfl:  Vitamin D, Ergocalciferol, (DRISDOL) 1.25 MG (50000 UNIT) CAPS capsule, Take 1 capsule (50,000 Units total) by mouth every 7 (seven) days., Disp: 5 capsule, Rfl: 11 acetaminophen (TYLENOL) 500 MG tablet, Take 500 mg by mouth every 6 (six) hours as needed for moderate pain or headache. (Patient not taking: Reported on 12/27/2022), Disp: , Rfl:  aspirin EC 81 MG tablet, Take 81 mg by mouth daily. Swallow whole. (Patient not taking: Reported on 01/28/2023), Disp: , Rfl:  EPINEPHRINE 0.3 mg/0.3 mL IJ SOAJ injection, INJECT INTO OUTER THIGH AND HOLD AGAINST LEG FOR 10 SECONDS FOR SEVERE ALLERGIC REACTION. (Patient not taking: Reported on 01/28/2023), Disp: 2 each, Rfl: 3 estradiol (ESTRACE) 0.1 MG/GM vaginal cream, Apply a pea size amount of cream to urethral area of vagina twice weekly (Patient not taking: Reported on 01/28/2023), Disp: 42.5 g, Rfl: 3 HYDROcodone-acetaminophen (NORCO) 10-325 MG tablet, 1 every 6 hours as needed pain caution drowsiness (Patient not taking: Reported on 01/28/2023), Disp: 20 tablet, Rfl: 0 ketorolac (TORADOL) 10 MG tablet, Take 1 tablet (10 mg total) by mouth every 8 (eight) hours as needed. (Patient not taking: Reported on 01/28/2023), Disp: 15 tablet, Rfl: 0 ondansetron (ZOFRAN-ODT) 8 MG disintegrating tablet, Take 8 mg by mouth every 8 (eight) hours as needed for nausea or vomiting. (Patient not taking: Reported on 01/28/2023), Disp: , Rfl:  ondansetron (ZOFRAN-ODT) 8 MG disintegrating tablet, Take 1 tablet (8 mg total) by mouth every 8 (eight) hours as needed for nausea or vomiting. (Patient not taking: Reported on 12/27/2022), Disp: 8 tablet, Rfl: 0  No current facility-administered medications for this visit.    Blood pressure 136/80, pulse 81, height 5\' 2"  (1.575 m), weight 96 lb (43.5 kg).   Physical Exam: Abdomen is soft non tender well healed incisions x 4 Cuff is intact healing well Good support  Diagnostic  Tests:   Pathology: benign  Impression + Management plan: (N95.2) Atrophic vaginitis     Medications Prescribed this encounter: Orders Placed This Encounter     estradiol (ESTRACE) 0.1 MG/GM vaginal cream         Sig: 0.5 grams at bedtime every other night         Dispense:  30 g         Refill:  12     Follow up: Return in about 6 weeks (around 03/11/2023) for Follow up, Post Op, with Dr Despina Hidden.     Lazaro Arms, MD Attending Physician for the Center for [facility] and [facility] 01/28/2023 9:35 AM

## 2023-01-28 NOTE — Telephone Encounter (Signed)
Form completed, there is some administrative aspects to fill and-please do Please scan a copy into the system Patient should be notified to pick up thank you

## 2023-01-31 ENCOUNTER — Encounter: Payer: BC Managed Care – PPO | Admitting: Obstetrics & Gynecology

## 2023-02-02 ENCOUNTER — Other Ambulatory Visit (HOSPITAL_COMMUNITY): Admission: RE | Admit: 2023-02-02 | Payer: BC Managed Care – PPO | Source: Ambulatory Visit

## 2023-02-02 ENCOUNTER — Other Ambulatory Visit (HOSPITAL_COMMUNITY)
Admission: RE | Admit: 2023-02-02 | Discharge: 2023-02-02 | Disposition: A | Discharge status: Home / Self Care | Payer: BC Managed Care – PPO | Source: Ambulatory Visit | Attending: Internal Medicine | Admitting: Internal Medicine

## 2023-02-02 DIAGNOSIS — E785 Hyperlipidemia, unspecified: Secondary | ICD-10-CM | POA: Insufficient documentation

## 2023-02-03 LAB — NMR, LIPOPROFILE
Cholesterol, Total: 133 mg/dL (ref 100–199)
HDL Cholesterol by NMR: 65 mg/dL (ref >39)
HDL Particle Number: 33.5 umol/L (ref >=30.5)
LDL Particle Number: 648 nmol/L (ref <1000)
LDL Size: 20.8 nm (ref >20.5)
LDL-C (NIH Calc): 54 mg/dL (ref 0–99)
LP-IR Score: 34 (ref <=45)
Small LDL Particle Number: 305 nmol/L (ref <=527)
Triglycerides by NMR: 68 mg/dL (ref 0–149)

## 2023-02-06 ENCOUNTER — Ambulatory Visit
Admission: EM | Admit: 2023-02-06 | Discharge: 2023-02-06 | Disposition: A | Discharge status: Home / Self Care | Payer: BC Managed Care – PPO | Attending: Family Medicine | Admitting: Family Medicine

## 2023-02-06 ENCOUNTER — Ambulatory Visit: Payer: Self-pay

## 2023-02-06 DIAGNOSIS — J22 Unspecified acute lower respiratory infection: Secondary | ICD-10-CM

## 2023-02-06 DIAGNOSIS — J441 Chronic obstructive pulmonary disease with (acute) exacerbation: Secondary | ICD-10-CM

## 2023-02-06 MED ORDER — PREDNISONE 20 MG PO TABS: 40.0000 mg | ORAL_TABLET | Freq: Every day | ORAL | 0 refills | Status: DC

## 2023-02-06 MED ORDER — PROMETHAZINE-DM 6.25-15 MG/5ML PO SYRP: 5.0000 mL | ORAL_SOLUTION | Freq: Four times a day (QID) | ORAL | 0 refills | Status: DC | PRN

## 2023-02-06 MED ORDER — DOXYCYCLINE HYCLATE 100 MG PO CAPS: 100.0000 mg | ORAL_CAPSULE | Freq: Two times a day (BID) | ORAL | 0 refills | Status: DC

## 2023-02-06 NOTE — ED Triage Notes (Signed)
Pt reports she has a cough and runny x 1 week.

## 2023-02-06 NOTE — ED Provider Notes (Signed)
[facility]    CSN: [id number] Arrival date & time: 02/06/23  0957      History   Chief Complaint No chief complaint on file.   HPI STORY Isabel Atkinson is a 57 y.o. female.   Patient presenting today with 1 week history of productive cough, wheezing, nasal congestion.  Denies fever, chills, chest pain, significant shortness of breath, abdominal pain, nausea vomiting or diarrhea.  So far not trying anything over-the-counter for symptoms.  History of reactive airway disease and COPD, has an albuterol inhaler at home but states she has never used it.  Family members all sick with similar symptoms.    Past Medical History:  Diagnosis Date   Anxiety    COPD (chronic obstructive pulmonary disease) (HCC)    Dysrhythmia    GERD (gastroesophageal reflux disease)    Hypercholesterolemia    Pre-diabetes    Reactive airway disease    Tachycardia     Patient Active Problem List   Diagnosis Date Noted   Rectal pain 06/01/2022   Left sided abdominal pain 06/01/2022   Hematuria 11/17/2021   Encounter for screening colonoscopy 11/03/2021   Coronary artery disease involving native heart without angina pectoris 02/26/2020   Aortic atherosclerosis (HCC) 02/26/2020   Current smoker 12/04/2019   Neurodermatitis 06/28/2016   COPD (chronic obstructive pulmonary disease) (HCC) 06/10/2016   Osteoporosis 07/20/2014   Irritable bowel syndrome 07/05/2014   Esophageal dysphagia 01/11/2012   GERD (gastroesophageal reflux disease) 01/11/2012   Chronic diarrhea 01/11/2012    Past Surgical History:  Procedure Laterality Date   CATARACT EXTRACTION W/PHACO Left 01/17/2017   Procedure: CATARACT EXTRACTION PHACO AND INTRAOCULAR LENS PLACEMENT (IOC);  Surgeon: Gemma Payor, MD;  Location: [facility];  Service: Ophthalmology;  Laterality: Left;  CDE: 6.92   CATARACT EXTRACTION W/PHACO Right 03/07/2020   Procedure: CATARACT EXTRACTION PHACO AND INTRAOCULAR LENS PLACEMENT RIGHT EYE;  Surgeon:  Fabio Pierce, MD;  Location: [facility];  Service: Ophthalmology;  Laterality: Right;  CDE: 8.44   COLONOSCOPY  2013   RMR: Normal rectum. single diminutive sigmoid polyp noted above. Normal terminal ileum. Status post segmental biopsy. next TCS in 10 years.    COLONOSCOPY WITH PROPOFOL N/A 01/27/2022   Procedure: COLONOSCOPY WITH PROPOFOL;  Surgeon: Corbin Ade, MD;  Location: [facility];  Service: Endoscopy;  Laterality: N/A;  12:45pm   ELBOW SURGERY Right 2023   ESOPHAGOGASTRODUODENOSCOPY  2013   RMR: Possible cervical esophageal web-status post dilation as described above. Small hiatal hernia. Status post biopsy of normal appearing small bowel to screen for celiac disease.    ESOPHAGOGASTRODUODENOSCOPY N/A 05/06/2016   normal esophagus, small hiatal hernia, empiric dilation.   ESOPHAGOGASTRODUODENOSCOPY (EGD) WITH PROPOFOL N/A 01/27/2022   Procedure: ESOPHAGOGASTRODUODENOSCOPY (EGD) WITH PROPOFOL;  Surgeon: Corbin Ade, MD;  Location: [facility];  Service: Endoscopy;  Laterality: N/A;   EYE SURGERY Bilateral    Cat Sx - Symfony MF IOLs   MALONEY DILATION N/A 05/06/2016   Procedure: MALONEY DILATION;  Surgeon: Corbin Ade, MD;  Location: [facility];  Service: Endoscopy;  Laterality: N/A;   MALONEY DILATION N/A 01/27/2022   Procedure: Elease Hashimoto DILATION;  Surgeon: Corbin Ade, MD;  Location: [facility];  Service: Endoscopy;  Laterality: N/A;   NECK SURGERY  10/2012   OVARIAN CYST SURGERY  1997   removed   POLYPECTOMY  01/27/2022   Procedure: POLYPECTOMY;  Surgeon: Corbin Ade, MD;  Location: [facility];  Service: Endoscopy;;  cecal   ROBOTIC ASSISTED LAPAROSCOPIC HYSTERECTOMY AND SALPINGECTOMY Bilateral 12/14/2022   Procedure: XI ROBOTIC ASSISTED LAPAROSCOPIC HYSTERECTOMY AND SALPINGO-OOPHORECTOMY;  Surgeon: Lazaro Arms, MD;  Location: [facility];  Service: Gynecology;  Laterality: Bilateral;   TUBAL LIGATION  2000   VAGINAL PROLAPSE REPAIR N/A 12/14/2022    Procedure: VAGINAL VAULT SUSPENSION;  Surgeon: Lazaro Arms, MD;  Location: [facility];  Service: Gynecology;  Laterality: N/A;   YAG LASER APPLICATION Bilateral     OB History     Gravida  3   Para  2   Term  2   Preterm      AB  1   Living  2      SAB      IAB      Ectopic      Multiple      Live Births               Home Medications    Prior to Admission medications   Medication Sig Start Date End Date Taking? Authorizing Provider  doxycycline (VIBRAMYCIN) 100 MG capsule Take 1 capsule (100 mg total) by mouth 2 (two) times daily. 02/06/23  Yes Particia Nearing, PA-C  predniSONE (DELTASONE) 20 MG tablet Take 2 tablets (40 mg total) by mouth daily with breakfast. 02/06/23  Yes Particia Nearing, PA-C  promethazine-dextromethorphan (PROMETHAZINE-DM) 6.25-15 MG/5ML syrup Take 5 mLs by mouth 4 (four) times daily as needed. 02/06/23  Yes Particia Nearing, PA-C  acetaminophen (TYLENOL) 500 MG tablet Take 500 mg by mouth every 6 (six) hours as needed for moderate pain or headache. Patient not taking: Reported on 12/27/2022    [provider]  albuterol (VENTOLIN HFA) 108 (90 Base) MCG/ACT inhaler Inhale 2 puffs into the lungs every 4 (four) hours as needed for wheezing or shortness of breath. 05/21/22   Babs Sciara, MD  ALPRAZolam (XANAX) 0.25 MG tablet TAKE 1/2 TO 1 TABLET BY MOUTH TWICE DAILY AS NEEDED FOR ANXIETY. 02/04/20   Babs Sciara, MD  aspirin EC 81 MG tablet Take 81 mg by mouth daily. Swallow whole. Patient not taking: Reported on 01/28/2023    [provider]  Bromfenac Sodium (PROLENSA) 0.07 % SOLN Place 1 drop into the right eye daily. 09/24/22   Rennis Chris, MD  CRANBERRY PO Take 1 tablet by mouth daily. Gummy    [provider]  cycloSPORINE (RESTASIS) 0.05 % ophthalmic emulsion Place 1 drop into both eyes 2 (two) times daily.    [provider]  denosumab (PROLIA) 60 MG/ML SOSY injection Inject 60 mg  into the skin every 6 (six) months. 11/29/22   Nida, Denman George, MD  EPINEPHRINE 0.3 mg/0.3 mL IJ SOAJ injection INJECT INTO OUTER THIGH AND HOLD AGAINST LEG FOR 10 SECONDS FOR SEVERE ALLERGIC REACTION. Patient not taking: Reported on 01/28/2023 05/11/21   Babs Sciara, MD  estradiol (ESTRACE) 0.1 MG/GM vaginal cream Apply a pea size amount of cream to urethral area of vagina twice weekly Patient not taking: Reported on 01/28/2023 03/03/22   Summerlin, Regan Rakers, PA-C  estradiol (ESTRACE) 0.1 MG/GM vaginal cream 0.5 grams at bedtime every other night 01/28/23   Lazaro Arms, MD  HYDROcodone-acetaminophen (NORCO) 10-325 MG tablet 1 every 6 hours as needed pain caution drowsiness Patient not taking: Reported on 01/28/2023 01/06/23   Babs Sciara, MD  ketorolac (TORADOL) 10 MG tablet Take 1 tablet (10 mg total) by mouth every 8 (eight) hours as needed.  Patient not taking: Reported on 01/28/2023 12/14/22   Lazaro Arms, MD  Misc Natural Products (ELDERBERRY IMMUNE COMPLEX PO) Take 1 tablet by mouth daily. Gummy    [provider]  Multiple Vitamins-Minerals (MULTIVITAMIN WITH MINERALS) tablet Take 1 tablet by mouth daily.     [provider]  omeprazole (PRILOSEC) 40 MG capsule Take 1 capsule (40 mg total) by mouth daily as needed. Patient taking differently: Take 40 mg by mouth daily. 10/27/22   Tiffany Kocher, PA-C  ondansetron (ZOFRAN-ODT) 8 MG disintegrating tablet Take 8 mg by mouth every 8 (eight) hours as needed for nausea or vomiting. Patient not taking: Reported on 01/28/2023    [provider]  ondansetron (ZOFRAN-ODT) 8 MG disintegrating tablet Take 1 tablet (8 mg total) by mouth every 8 (eight) hours as needed for nausea or vomiting. Patient not taking: Reported on 12/27/2022 12/14/22   Lazaro Arms, MD  phenazopyridine (PYRIDIUM) 100 MG tablet Take 1 tablet (100 mg total) by mouth 3 (three) times daily as needed for pain. 09/30/22   Valentino Nose,  NP  rosuvastatin (CRESTOR) 10 MG tablet Take 1 tablet (10 mg total) by mouth daily. Patient taking differently: Take 10 mg by mouth 3 (three) times a week. 08/23/22 08/18/23  Pricilla Riffle, MD  Triamcinolone Acetonide (NASACORT ALLERGY 24HR NA) Place 2 sprays into the nose daily.    [provider]  Vitamin D, Ergocalciferol, (DRISDOL) 1.25 MG (50000 UNIT) CAPS capsule Take 1 capsule (50,000 Units total) by mouth every 7 (seven) days. 08/17/22   Pricilla Riffle, MD    Family History Family History  Problem Relation Age of Onset   Colon cancer Maternal Grandfather 35   Colon polyps Mother 43   Colon cancer Maternal Uncle 56   Hypertension Father    Diabetes Father    Heart disease Father    Hyperlipidemia Father    Stroke Father     Social History Social History   Tobacco Use   Smoking status: Every Day    Packs/day: 1.00    Years: 25.00    Additional pack years: 0.00    Total pack years: 25.00    Types: Cigarettes   Smokeless tobacco: Never  Vaping Use   Vaping Use: Never used  Substance Use Topics   Alcohol use: No    Alcohol/week: 0.0 standard drinks of alcohol   Drug use: No     Allergies   Ciprofloxacin, Augmentin [amoxicillin-pot clavulanate], Carafate [sucralfate], Celexa [citalopram hydrobromide], Chantix [varenicline], Sulfa antibiotics, Sulfur, Wellbutrin [bupropion], Fosamax [alendronate sodium], and Gabapentin   Review of Systems Review of Systems Per HPI  Physical Exam Triage Vital Signs ED Triage Vitals [02/06/23 1013]  Enc Vitals Group     BP 130/79     Pulse Rate 80     Resp 18     Temp 98.5 F (36.9 C)     Temp Source Oral     SpO2 97 %     Weight      Height      Head Circumference      Peak Flow      Pain Score 0     Pain Loc      Pain Edu?      Excl. in GC?    No data found.  Updated Vital Signs BP 130/79 (BP Location: Right Arm)   Pulse 80   Temp 98.5 F (36.9 C) (Oral)   Resp 18   SpO2  97%   Visual Acuity Right Eye  Distance:   Left Eye Distance:   Bilateral Distance:    Right Eye Near:   Left Eye Near:    Bilateral Near:     Physical Exam Vitals and nursing note reviewed.  Constitutional:      Appearance: Normal appearance.  HENT:     Head: Atraumatic.     Right Ear: Tympanic membrane and external ear normal.     Left Ear: Tympanic membrane and external ear normal.     Nose: Congestion present.     Mouth/Throat:     Mouth: Mucous membranes are moist.     Pharynx: Posterior oropharyngeal erythema present.  Eyes:     Extraocular Movements: Extraocular movements intact.     Conjunctiva/sclera: Conjunctivae normal.  Cardiovascular:     Rate and Rhythm: Normal rate and regular rhythm.     Heart sounds: Normal heart sounds.  Pulmonary:     Effort: Pulmonary effort is normal.     Breath sounds: Wheezing present.  Musculoskeletal:        General: Normal range of motion.     Cervical back: Normal range of motion and neck supple.  Skin:    General: Skin is warm and dry.  Neurological:     Mental Status: She is alert and oriented to person, place, and time.  Psychiatric:        Mood and Affect: Mood normal.        Thought Content: Thought content normal.      UC Treatments / Results  Labs (all labs ordered are listed, but only abnormal results are displayed) Labs Reviewed - No data to display  EKG   Radiology No results found.  Procedures Procedures (including critical care time)  Medications Ordered in UC Medications - No data to display  Initial Impression / Assessment and Plan / UC Course  I have reviewed the triage vital signs and the nursing notes.  Pertinent labs & imaging results that were available during my care of the patient were reviewed by me and considered in my medical decision making (see chart for details).     Given duration worsening course, treat with doxycycline, prednisone, Phenergan DM, albuterol as needed and supportive over-the-counter medications  and home care.  Return for worsening symptoms.  Final Clinical Impressions(s) / UC Diagnoses   Final diagnoses:  Lower respiratory infection  COPD exacerbation [facility])   Discharge Instructions   None    ED Prescriptions     Medication Sig Dispense Auth. Provider   promethazine-dextromethorphan (PROMETHAZINE-DM) 6.25-15 MG/5ML syrup Take 5 mLs by mouth 4 (four) times daily as needed. 100 mL Particia Nearing, PA-C   predniSONE (DELTASONE) 20 MG tablet Take 2 tablets (40 mg total) by mouth daily with breakfast. 10 tablet Particia Nearing, PA-C   doxycycline (VIBRAMYCIN) 100 MG capsule Take 1 capsule (100 mg total) by mouth 2 (two) times daily. 14 capsule Particia Nearing, [place]      PDMP not reviewed this encounter.   Particia Nearing, [place] 02/06/23 1120

## 2023-02-07 ENCOUNTER — Other Ambulatory Visit (HOSPITAL_COMMUNITY): Payer: Self-pay | Admitting: Family Medicine

## 2023-02-07 ENCOUNTER — Other Ambulatory Visit: Payer: Self-pay | Admitting: Family Medicine

## 2023-02-07 ENCOUNTER — Encounter: Payer: Self-pay | Admitting: Family Medicine

## 2023-02-07 DIAGNOSIS — Z1231 Encounter for screening mammogram for malignant neoplasm of breast: Secondary | ICD-10-CM

## 2023-02-07 MED ORDER — HYDROCODONE-ACETAMINOPHEN 10-325 MG PO TABS: ORAL_TABLET | ORAL | 0 refills | Status: DC

## 2023-02-15 ENCOUNTER — Other Ambulatory Visit: Payer: Self-pay | Admitting: Family Medicine

## 2023-02-15 ENCOUNTER — Ambulatory Visit: Payer: BC Managed Care – PPO | Admitting: Family Medicine

## 2023-02-15 DIAGNOSIS — Z720 Tobacco use: Secondary | ICD-10-CM

## 2023-02-15 DIAGNOSIS — R051 Acute cough: Secondary | ICD-10-CM

## 2023-02-15 DIAGNOSIS — J441 Chronic obstructive pulmonary disease with (acute) exacerbation: Secondary | ICD-10-CM | POA: Diagnosis not present

## 2023-02-15 DIAGNOSIS — J019 Acute sinusitis, unspecified: Secondary | ICD-10-CM | POA: Diagnosis not present

## 2023-02-15 MED ORDER — CLARITHROMYCIN 500 MG PO TABS: 500.0000 mg | ORAL_TABLET | Freq: Two times a day (BID) | ORAL | 0 refills | Status: DC

## 2023-02-15 NOTE — Progress Notes (Signed)
   Subjective:    Patient ID: Isabel Atkinson, female    DOB: 1965-09-19, 57 y.o.   MRN: [id number]  HPI  Patient presents today with respiratory illness Number of days present-well over 15 days  Symptoms include-increased congestion coughing past several days some sweats and chills at nighttime no high fevers  Presence of worrisome signs (severe shortness of breath, lethargy, etc.) -sweats at nighttime feeling short winded with activity  Recent/current visit to urgent care or ER-did go to urgent care  Recent direct exposure to Covid-negative testing  Any current Covid testing-negative home testing  Has been exposed to family members who had similar illness for several weeks  Review of Systems     Objective:   Physical Exam  Gen-NAD not toxic TMS-normal bilateral T- normal no redness Chest-CTA respiratory rate normal no crackles CV RRR no murmur Skin-warm dry Neuro-grossly normal O2 saturation 96% patient not in respiratory distress no pneumonia heard      Assessment & Plan:  Patient was seen today for upper respiratory illness. It is felt that the patient is dealing with sinusitis.  Antibiotics were prescribed today.  Compliance discussed.  If worsening symptoms or progressive illness notify us.   If emergency call 911 or go to ER.  COPD exacerbation elected not to utilize steroids again if she does not show improvement with the antibiotics and combination inhaler she has a lung cancer screening in September Patient strongly counseled to quit smoking

## 2023-02-16 ENCOUNTER — Ambulatory Visit: Payer: Self-pay

## 2023-02-21 ENCOUNTER — Ambulatory Visit (HOSPITAL_COMMUNITY)
Admission: RE | Admit: 2023-02-21 | Discharge: 2023-02-21 | Disposition: A | Discharge status: Home / Self Care | Payer: BC Managed Care – PPO | Source: Ambulatory Visit | Attending: Family Medicine | Admitting: Family Medicine

## 2023-02-21 DIAGNOSIS — Z1231 Encounter for screening mammogram for malignant neoplasm of breast: Secondary | ICD-10-CM | POA: Diagnosis present

## 2023-02-23 ENCOUNTER — Encounter: Payer: Self-pay | Admitting: Obstetrics & Gynecology

## 2023-02-23 ENCOUNTER — Encounter: Payer: Self-pay | Admitting: Family Medicine

## 2023-02-24 ENCOUNTER — Other Ambulatory Visit: Payer: Self-pay | Admitting: Gastroenterology

## 2023-03-04 ENCOUNTER — Telehealth: Payer: Self-pay

## 2023-03-04 ENCOUNTER — Encounter: Payer: Self-pay | Admitting: Family Medicine

## 2023-03-04 NOTE — Telephone Encounter (Signed)
FMLA forms dropped off placed in folder up front

## 2023-03-08 ENCOUNTER — Other Ambulatory Visit: Payer: Self-pay | Admitting: Family Medicine

## 2023-03-08 MED ORDER — HYDROCODONE-ACETAMINOPHEN 10-325 MG PO TABS: ORAL_TABLET | ORAL | 0 refills | Status: DC

## 2023-03-11 ENCOUNTER — Encounter: Payer: Self-pay | Admitting: Obstetrics & Gynecology

## 2023-03-11 ENCOUNTER — Ambulatory Visit (INDEPENDENT_AMBULATORY_CARE_PROVIDER_SITE_OTHER): Payer: BC Managed Care – PPO | Admitting: Obstetrics & Gynecology

## 2023-03-11 VITALS — BP 139/75 | HR 84 | Ht 62.0 in | Wt 97.0 lb

## 2023-03-11 DIAGNOSIS — Z9889 Other specified postprocedural states: Secondary | ICD-10-CM

## 2023-03-11 MED ORDER — CLOBETASOL PROPIONATE 0.05 % EX CREA: 1.0000 | TOPICAL_CREAM | Freq: Two times a day (BID) | CUTANEOUS | 0 refills | Status: DC

## 2023-03-11 NOTE — Telephone Encounter (Signed)
I would recommend that we fill this form out when she comes in for her visit in August.  Please keep the form upfront then forward it with her at the time of the visit thank you

## 2023-03-11 NOTE — Telephone Encounter (Signed)
Patient has appointment on 8/13 with you

## 2023-03-11 NOTE — Progress Notes (Signed)
HPI: Patient returns for routine postoperative follow-up having undergone RA TLH BSO vv suspension on 12/14/22 .  The patient's immediate postoperative recovery has been unremarkable. Since hospital discharge the patient reports doing well no problems.   Current Outpatient Medications: acetaminophen (TYLENOL) 500 MG tablet, Take 500 mg by mouth every 6 (six) hours as needed for moderate pain or headache., Disp: , Rfl:  albuterol (VENTOLIN HFA) 108 (90 Base) MCG/ACT inhaler, Inhale 2 puffs into the lungs every 4 (four) hours as needed for wheezing or shortness of breath., Disp: 8 g, Rfl: 0 ALPRAZolam (XANAX) 0.25 MG tablet, TAKE 1/2 TO 1 TABLET BY MOUTH TWICE DAILY AS NEEDED FOR ANXIETY., Disp: 30 tablet, Rfl: 0 aspirin EC 81 MG tablet, Take 81 mg by mouth daily. Swallow whole., Disp: , Rfl:  Bromfenac Sodium (PROLENSA) 0.07 % SOLN, Place 1 drop into the right eye daily., Disp: 3 mL, Rfl: 10 clobetasol cream (TEMOVATE) 0.05 %, Apply 1 Application topically 2 (two) times daily., Disp: 30 g, Rfl: 0 CRANBERRY PO, Take 1 tablet by mouth daily. Gummy, Disp: , Rfl:  cycloSPORINE (RESTASIS) 0.05 % ophthalmic emulsion, Place 1 drop into both eyes 2 (two) times daily., Disp: , Rfl:  denosumab (PROLIA) 60 MG/ML SOSY injection, Inject 60 mg into the skin every 6 (six) months., Disp: 1 mL, Rfl: 1 estradiol (ESTRACE) 0.1 MG/GM vaginal cream, 0.5 grams at bedtime every other night, Disp: 30 g, Rfl: 12 HYDROcodone-acetaminophen (NORCO) 10-325 MG tablet, 1 every 6 hours as needed pain caution drowsiness, Disp: 20 tablet, Rfl: 0 Misc Natural Products (ELDERBERRY IMMUNE COMPLEX PO), Take 1 tablet by mouth daily. Gummy, Disp: , Rfl:  Multiple Vitamins-Minerals (MULTIVITAMIN WITH MINERALS) tablet, Take 1 tablet by mouth daily. , Disp: , Rfl:  omeprazole (PRILOSEC) 40 MG capsule, Take 1 capsule (40 mg total) by mouth daily as needed., Disp: 90 capsule, Rfl: 1 phenazopyridine (PYRIDIUM) 100 MG tablet, Take 1 tablet  (100 mg total) by mouth 3 (three) times daily as needed for pain., Disp: 10 tablet, Rfl: 0 rosuvastatin (CRESTOR) 10 MG tablet, Take 1 tablet (10 mg total) by mouth daily. (Patient taking differently: Take 10 mg by mouth 3 (three) times a week.), Disp: 90 tablet, Rfl: 3 Triamcinolone Acetonide (NASACORT ALLERGY 24HR NA), Place 2 sprays into the nose daily., Disp: , Rfl:  Vitamin D, Ergocalciferol, (DRISDOL) 1.25 MG (50000 UNIT) CAPS capsule, Take 1 capsule (50,000 Units total) by mouth every 7 (seven) days., Disp: 5 capsule, Rfl: 11 EPINEPHRINE 0.3 mg/0.3 mL IJ SOAJ injection, INJECT INTO OUTER THIGH AND HOLD AGAINST LEG FOR 10 SECONDS FOR SEVERE ALLERGIC REACTION. (Patient not taking: Reported on 01/28/2023), Disp: 2 each, Rfl: 3 estradiol (ESTRACE) 0.1 MG/GM vaginal cream, Apply a pea size amount of cream to urethral area of vagina twice weekly (Patient not taking: Reported on 01/28/2023), Disp: 42.5 g, Rfl: 3 ketorolac (TORADOL) 10 MG tablet, Take 1 tablet (10 mg total) by mouth every 8 (eight) hours as needed. (Patient not taking: Reported on 01/28/2023), Disp: 15 tablet, Rfl: 0 ondansetron (ZOFRAN-ODT) 8 MG disintegrating tablet, Take 8 mg by mouth every 8 (eight) hours as needed for nausea or vomiting. (Patient not taking: Reported on 01/28/2023), Disp: , Rfl:  ondansetron (ZOFRAN-ODT) 8 MG disintegrating tablet, Take 1 tablet (8 mg total) by mouth every 8 (eight) hours as needed for nausea or vomiting. (Patient not taking: Reported on 12/27/2022), Disp: 8 tablet, Rfl: 0 predniSONE (DELTASONE) 20 MG tablet, Take 2 tablets (40 mg total) by mouth daily with breakfast. (  Patient not taking: Reported on 03/11/2023), Disp: 10 tablet, Rfl: 0 promethazine-dextromethorphan (PROMETHAZINE-DM) 6.25-15 MG/5ML syrup, Take 5 mLs by mouth 4 (four) times daily as needed. (Patient not taking: Reported on 03/11/2023), Disp: 100 mL, Rfl: 0  No current facility-administered medications for this visit.    Blood pressure  139/75, pulse 84, height 5\' 2"  (1.575 m), weight 97 lb (44 kg).  Physical Exam: 4 incisions all healing well Abdomen soft benign Cuff well healed suture still present(180 day PDS) Ok for sex  Diagnostic Tests:   Pathology: benign  Impression + Management plan:   ICD-10-CM   1. S/P Robot assist TLH/BSO, VV suspension  Z98.890          Medications Prescribed this encounter: Orders Placed This Encounter     clobetasol cream (TEMOVATE) 0.05 %         Sig: Apply 1 Application topically 2 (two) times daily.         Dispense:  30 g         Refill:  0 For left arem poison ivy reaction    Follow up: Return if symptoms worsen or fail to improve.    Lazaro Arms, MD Attending Physician for the Center for [facility] and [facility] 03/11/2023 9:27 AM

## 2023-03-12 ENCOUNTER — Ambulatory Visit
Admission: RE | Admit: 2023-03-12 | Discharge: 2023-03-12 | Disposition: A | Discharge status: Home / Self Care | Payer: BC Managed Care – PPO | Source: Ambulatory Visit | Attending: Nurse Practitioner | Admitting: Nurse Practitioner

## 2023-03-12 VITALS — BP 127/81 | HR 87 | Temp 98.9°F | Resp 16

## 2023-03-12 DIAGNOSIS — J0140 Acute pansinusitis, unspecified: Secondary | ICD-10-CM | POA: Diagnosis not present

## 2023-03-12 MED ORDER — DOXYCYCLINE HYCLATE 100 MG PO TABS: 100.0000 mg | ORAL_TABLET | Freq: Two times a day (BID) | ORAL | 0 refills | Status: AC

## 2023-03-12 MED ORDER — PSEUDOEPH-BROMPHEN-DM 30-2-10 MG/5ML PO SYRP: 5.0000 mL | ORAL_SOLUTION | Freq: Four times a day (QID) | ORAL | 0 refills | Status: DC | PRN

## 2023-03-12 NOTE — Discharge Instructions (Signed)
Take medication as directed. Continue your current allergy medication regimen. Increase fluids and get plenty of rest. May take over-the-counter ibuprofen or Tylenol as needed for pain, fever, or general discomfort. Recommend normal saline nasal spray to help with nasal congestion throughout the day. For your cough, it may be helpful to use a humidifier at bedtime during sleep. If symptoms do not improve with this treatment, please follow-up with your primary care physician for further evaluation. Follow-up as needed.

## 2023-03-12 NOTE — ED Provider Notes (Signed)
[facility]    CSN: [id number] Arrival date & time: 03/12/23  1355      History   Chief Complaint Chief Complaint  Patient presents with   cough and nasal congestion    HPI DEARRA MYHAND is a 57 y.o. female.   The history is provided by the patient.   Patient presents with a more than 1 week history of headache, nasal congestion, cough, and chest congestion.  Patient denies fever, chills, sore throat, ear pain, ear drainage, wheezing, shortness of breath, difficulty breathing, chest pain, abdominal pain, nausea, vomiting, or diarrhea.  Patient reports she has been taking over-the-counter Mucinex for her cough.  She has also been using Nasacort daily.  She also reports that she has a history of COPD.  Past Medical History:  Diagnosis Date   Anxiety    COPD (chronic obstructive pulmonary disease) (HCC)    Dysrhythmia    GERD (gastroesophageal reflux disease)    Hypercholesterolemia    Pre-diabetes    Reactive airway disease    Tachycardia     Patient Active Problem List   Diagnosis Date Noted   Rectal pain 06/01/2022   Left sided abdominal pain 06/01/2022   Hematuria 11/17/2021   Encounter for screening colonoscopy 11/03/2021   Coronary artery disease involving native heart without angina pectoris 02/26/2020   Aortic atherosclerosis (HCC) 02/26/2020   Current smoker 12/04/2019   Neurodermatitis 06/28/2016   COPD (chronic obstructive pulmonary disease) (HCC) 06/10/2016   Osteoporosis 07/20/2014   Irritable bowel syndrome 07/05/2014   Esophageal dysphagia 01/11/2012   GERD (gastroesophageal reflux disease) 01/11/2012   Chronic diarrhea 01/11/2012    Past Surgical History:  Procedure Laterality Date   CATARACT EXTRACTION W/PHACO Left 01/17/2017   Procedure: CATARACT EXTRACTION PHACO AND INTRAOCULAR LENS PLACEMENT (IOC);  Surgeon: Gemma Payor, MD;  Location: [facility];  Service: Ophthalmology;  Laterality: Left;  CDE: 6.92   CATARACT EXTRACTION W/PHACO  Right 03/07/2020   Procedure: CATARACT EXTRACTION PHACO AND INTRAOCULAR LENS PLACEMENT RIGHT EYE;  Surgeon: Fabio Pierce, MD;  Location: [facility];  Service: Ophthalmology;  Laterality: Right;  CDE: 8.44   COLONOSCOPY  2013   RMR: Normal rectum. single diminutive sigmoid polyp noted above. Normal terminal ileum. Status post segmental biopsy. next TCS in 10 years.    COLONOSCOPY WITH PROPOFOL N/A 01/27/2022   Procedure: COLONOSCOPY WITH PROPOFOL;  Surgeon: Corbin Ade, MD;  [facility]: [facility];  Service: Endoscopy;  Laterality: N/A;  12:45pm   ELBOW SURGERY Right 2023   ESOPHAGOGASTRODUODENOSCOPY  2013   RMR: Possible cervical esophageal web-status post dilation as described above. Small hiatal hernia. Status post biopsy of normal appearing small bowel to screen for celiac disease.    ESOPHAGOGASTRODUODENOSCOPY N/A 05/06/2016   normal esophagus, small hiatal hernia, empiric dilation.   ESOPHAGOGASTRODUODENOSCOPY (EGD) WITH PROPOFOL N/A 01/27/2022   Procedure: ESOPHAGOGASTRODUODENOSCOPY (EGD) WITH PROPOFOL;  Surgeon: Corbin Ade, MD;  [facility];  Service: Endoscopy;  Laterality: N/A;   EYE SURGERY Bilateral    Cat Sx - Symfony MF IOLs   MALONEY DILATION N/A 05/06/2016   Procedure: MALONEY DILATION;  Surgeon: Corbin Ade, MD;  [facility];  Service: Endoscopy;  Laterality: N/A;   MALONEY DILATION N/A 01/27/2022   Procedure: Elease Hashimoto DILATION;  Surgeon: Corbin Ade, MD;  [facility]: [facility];  Service: Endoscopy;  Laterality: N/A;   NECK SURGERY  10/2012   OVARIAN CYST SURGERY  1997   removed   POLYPECTOMY  01/27/2022   Procedure: POLYPECTOMY;  Surgeon: Corbin Ade, MD;  [facility];  Service: Endoscopy;;  cecal   ROBOTIC ASSISTED LAPAROSCOPIC HYSTERECTOMY AND SALPINGECTOMY Bilateral 12/14/2022   Procedure: XI ROBOTIC ASSISTED LAPAROSCOPIC HYSTERECTOMY AND SALPINGO-OOPHORECTOMY;  Surgeon: Lazaro Arms, MD;  Location: [facility];  Service:  Gynecology;  Laterality: Bilateral;   TUBAL LIGATION  2000   VAGINAL PROLAPSE REPAIR N/A 12/14/2022   Procedure: VAGINAL VAULT SUSPENSION;  Surgeon: Lazaro Arms, MD;  Location: [facility];  Service: Gynecology;  Laterality: N/A;   YAG LASER APPLICATION Bilateral     OB History     Gravida  3   Para  2   Term  2   Preterm      AB  1   Living  2      SAB      IAB      Ectopic      Multiple      Live Births               Home Medications    Prior to Admission medications   Medication Sig Start Date End Date Taking? Authorizing Provider  brompheniramine-pseudoephedrine-DM 30-2-10 MG/5ML syrup Take 5 mLs by mouth 4 (four) times daily as needed. 03/12/23  Yes Kaylen Motl-Warren, Sadie Haber, NP  doxycycline (VIBRA-TABS) 100 MG tablet Take 1 tablet (100 mg total) by mouth 2 (two) times daily for 5 days. 03/12/23 03/17/23 Yes Maida Widger-Warren, Sadie Haber, NP  acetaminophen (TYLENOL) 500 MG tablet Take 500 mg by mouth every 6 (six) hours as needed for moderate pain or headache.    [provider]  albuterol (VENTOLIN HFA) 108 (90 Base) MCG/ACT inhaler Inhale 2 puffs into the lungs every 4 (four) hours as needed for wheezing or shortness of breath. 05/21/22   Babs Sciara, MD  ALPRAZolam (XANAX) 0.25 MG tablet TAKE 1/2 TO 1 TABLET BY MOUTH TWICE DAILY AS NEEDED FOR ANXIETY. 02/04/20   Babs Sciara, MD  aspirin EC 81 MG tablet Take 81 mg by mouth daily. Swallow whole.    [provider]  Bromfenac Sodium (PROLENSA) 0.07 % SOLN Place 1 drop into the right eye daily. 09/24/22   Rennis Chris, MD  clobetasol cream (TEMOVATE) 0.05 % Apply 1 Application topically 2 (two) times daily. 03/11/23   Lazaro Arms, MD  CRANBERRY PO Take 1 tablet by mouth daily. Gummy    [provider]  cycloSPORINE (RESTASIS) 0.05 % ophthalmic emulsion Place 1 drop into both eyes 2 (two) times daily.    [provider]  denosumab (PROLIA) 60 MG/ML SOSY injection Inject 60 mg into  the skin every 6 (six) months. 11/29/22   Nida, Denman George, MD  EPINEPHRINE 0.3 mg/0.3 mL IJ SOAJ injection INJECT INTO OUTER THIGH AND HOLD AGAINST LEG FOR 10 SECONDS FOR SEVERE ALLERGIC REACTION. Patient not taking: Reported on 01/28/2023 05/11/21   Babs Sciara, MD  estradiol (ESTRACE) 0.1 MG/GM vaginal cream Apply a pea size amount of cream to urethral area of vagina twice weekly Patient not taking: Reported on 01/28/2023 03/03/22   Summerlin, Regan Rakers, PA-C  estradiol (ESTRACE) 0.1 MG/GM vaginal cream 0.5 grams at bedtime every other night 01/28/23   Lazaro Arms, MD  HYDROcodone-acetaminophen [facility]) 10-325 MG tablet 1 every 6 hours as needed pain caution drowsiness 03/08/23   Luking, Jonna Coup, MD  ketorolac (TORADOL) 10 MG tablet Take 1 tablet (10 mg total) by mouth every 8 (eight) hours as  needed. Patient not taking: Reported on 01/28/2023 12/14/22   Lazaro Arms, MD  Misc Natural Products (ELDERBERRY IMMUNE COMPLEX PO) Take 1 tablet by mouth daily. Gummy    [provider]  Multiple Vitamins-Minerals (MULTIVITAMIN WITH MINERALS) tablet Take 1 tablet by mouth daily.     [provider]  omeprazole (PRILOSEC) 40 MG capsule Take 1 capsule (40 mg total) by mouth daily as needed. 02/25/23   Tiffany Kocher, PA-C  ondansetron (ZOFRAN-ODT) 8 MG disintegrating tablet Take 8 mg by mouth every 8 (eight) hours as needed for nausea or vomiting. Patient not taking: Reported on 01/28/2023    [provider]  ondansetron (ZOFRAN-ODT) 8 MG disintegrating tablet Take 1 tablet (8 mg total) by mouth every 8 (eight) hours as needed for nausea or vomiting. Patient not taking: Reported on 12/27/2022 12/14/22   Lazaro Arms, MD  phenazopyridine (PYRIDIUM) 100 MG tablet Take 1 tablet (100 mg total) by mouth 3 (three) times daily as needed for pain. 09/30/22   Valentino Nose, NP  predniSONE (DELTASONE) 20 MG tablet Take 2 tablets (40 mg total) by mouth daily with  breakfast. Patient not taking: Reported on 03/11/2023 02/06/23   Particia Nearing, PA-C  promethazine-dextromethorphan (PROMETHAZINE-DM) 6.25-15 MG/5ML syrup Take 5 mLs by mouth 4 (four) times daily as needed. Patient not taking: Reported on 03/11/2023 02/06/23   Particia Nearing, PA-C  rosuvastatin (CRESTOR) 10 MG tablet Take 1 tablet (10 mg total) by mouth daily. Patient taking differently: Take 10 mg by mouth 3 (three) times a week. 08/23/22 08/18/23  Pricilla Riffle, MD  Triamcinolone Acetonide (NASACORT ALLERGY 24HR NA) Place 2 sprays into the nose daily.    [provider]  Vitamin D, Ergocalciferol, (DRISDOL) 1.25 MG (50000 UNIT) CAPS capsule Take 1 capsule (50,000 Units total) by mouth every 7 (seven) days. 08/17/22   Pricilla Riffle, MD    Family History Family History  Problem Relation Age of Onset   Colon cancer Maternal Grandfather 46   Colon polyps Mother 45   Colon cancer Maternal Uncle 46   Hypertension Father    Diabetes Father    Heart disease Father    Hyperlipidemia Father    Stroke Father     Social History Social History   Tobacco Use   Smoking status: Every Day    Current packs/day: 1.00    Average packs/day: 1 pack/day for 25.0 years (25.0 ttl pk-yrs)    Types: Cigarettes   Smokeless tobacco: Never  Vaping Use   Vaping status: Never Used  Substance Use Topics   Alcohol use: No    Alcohol/week: 0.0 standard drinks of alcohol   Drug use: No     Allergies   Ciprofloxacin, Augmentin [amoxicillin-pot clavulanate], Carafate [sucralfate], Celexa [citalopram hydrobromide], Chantix [varenicline], Sulfa antibiotics, Sulfur, Wellbutrin [bupropion], Fosamax [alendronate sodium], and Gabapentin   Review of Systems Review of Systems Per HPI  Physical Exam Triage Vital Signs ED Triage Vitals  Encounter Vitals Group     BP 03/12/23 1421 127/81     Systolic BP Percentile --      Diastolic BP Percentile --      Pulse Rate 03/12/23 1421 87     Resp  03/12/23 1421 16     Temp 03/12/23 1421 98.9 F (37.2 C)     Temp Source 03/12/23 1421 Oral     SpO2 03/12/23 1421 97 %     Weight --      Height --  Head Circumference --      Peak Flow --      Pain Score 03/12/23 1422 0     Pain Loc --      Pain Education --      Exclude from Growth Chart --    No data found.  Updated Vital Signs BP 127/81 (BP Location: Right Arm)   Pulse 87   Temp 98.9 F (37.2 C) (Oral)   Resp 16   SpO2 97%   Visual Acuity Right Eye Distance:   Left Eye Distance:   Bilateral Distance:    Right Eye Near:   Left Eye Near:    Bilateral Near:     Physical Exam Vitals and nursing note reviewed.  Constitutional:      General: She is not in acute distress.    Appearance: Normal appearance.  HENT:     Head: Normocephalic.     Right Ear: Tympanic membrane, ear canal and external ear normal.     Left Ear: Tympanic membrane, ear canal and external ear normal.     Nose: Congestion present.     Right Turbinates: Enlarged and swollen.     Left Turbinates: Enlarged and swollen.     Right Sinus: Maxillary sinus tenderness and frontal sinus tenderness present.     Left Sinus: Maxillary sinus tenderness and frontal sinus tenderness present.     Mouth/Throat:     Lips: Pink.     Mouth: Mucous membranes are moist.     Pharynx: Oropharynx is clear. Uvula midline. Posterior oropharyngeal erythema and postnasal drip present. No pharyngeal swelling, oropharyngeal exudate or uvula swelling.     Comments: Cobblestoning present to posterior oropharynx Eyes:     Extraocular Movements: Extraocular movements intact.     Conjunctiva/sclera: Conjunctivae normal.     Pupils: Pupils are equal, round, and reactive to light.  Cardiovascular:     Rate and Rhythm: Normal rate and regular rhythm.     Pulses: Normal pulses.     Heart sounds: Normal heart sounds.  Pulmonary:     Effort: Pulmonary effort is normal. No respiratory distress.     Breath sounds: Normal breath  sounds. No stridor. No wheezing, rhonchi or rales.  Abdominal:     General: Bowel sounds are normal.     Palpations: Abdomen is soft.     Tenderness: There is no abdominal tenderness.  Musculoskeletal:     Cervical back: Normal range of motion.  Lymphadenopathy:     Cervical: No cervical adenopathy.  Skin:    General: Skin is warm and dry.  Neurological:     General: No focal deficit present.     Mental Status: She is alert and oriented to person, place, and time.  Psychiatric:        Mood and Affect: Mood normal.        Behavior: Behavior normal.      UC Treatments / Results  Labs (all labs ordered are listed, but only abnormal results are displayed) Labs Reviewed - No data to display  EKG   Radiology No results found.  Procedures Procedures (including critical care time)  Medications Ordered in UC Medications - No data to display  Initial Impression / Assessment and Plan / UC Course  I have reviewed the triage vital signs and the nursing notes.  Pertinent labs & imaging results that were available during my care of the patient were reviewed by me and considered in my medical decision making (see chart for details).  The patient is well-appearing, she is in no acute distress, vital signs are stable.  Symptoms appear to be consistent with pansinusitis.  Symptoms have been present for more than 1 week, without resolution with use of over-the-counter medications.  Will treat empirically with a doxycycline 100 mg, and for her cough, Bromfed DM was prescribed.  Patient advised to continue use of Nasacort to help with nasal congestion and runny nose.  Supportive care recommendations were provided and discussed with the patient to include over-the-counter analgesics for pain or discomfort, increasing fluids, allowing for plenty of rest, use of a humidifier or sleeping elevated on pillows to help with her cough.  Patient advised to follow-up with her PCP if symptoms fail to  improve with this treatment.  Patient is in agreement with this plan of care and verbalizes understanding.  All questions were answered.  Patient stable for discharge.   Final Clinical Impressions(s) / UC Diagnoses   Final diagnoses:  Acute pansinusitis, recurrence not specified     Discharge Instructions      Take medication as directed. Continue your current allergy medication regimen. Increase fluids and get plenty of rest. May take over-the-counter ibuprofen or Tylenol as needed for pain, fever, or general discomfort. Recommend normal saline nasal spray to help with nasal congestion throughout the day. For your cough, it may be helpful to use a humidifier at bedtime during sleep. If symptoms do not improve with this treatment, please follow-up with your primary care physician for further evaluation. Follow-up as needed.     ED Prescriptions     Medication Sig Dispense Auth. Provider   doxycycline (VIBRA-TABS) 100 MG tablet Take 1 tablet (100 mg total) by mouth 2 (two) times daily for 5 days. 10 tablet Dominque Marlin-Warren, Sadie Haber, NP   brompheniramine-pseudoephedrine-DM 30-2-10 MG/5ML syrup Take 5 mLs by mouth 4 (four) times daily as needed. 140 mL Janitza Revuelta-Warren, Sadie Haber, NP      PDMP not reviewed this encounter.   Abran Cantor, NP 03/12/23 1450

## 2023-03-12 NOTE — ED Triage Notes (Signed)
Nasal congestion and productive cough x1 week.

## 2023-03-22 ENCOUNTER — Ambulatory Visit: Payer: BC Managed Care – PPO | Admitting: Family Medicine

## 2023-03-22 ENCOUNTER — Encounter: Payer: Self-pay | Admitting: Family Medicine

## 2023-03-22 VITALS — BP 104/64 | HR 78 | Temp 97.3°F | Ht 62.0 in | Wt 93.0 lb

## 2023-03-22 DIAGNOSIS — M5432 Sciatica, left side: Secondary | ICD-10-CM | POA: Diagnosis not present

## 2023-03-22 DIAGNOSIS — J019 Acute sinusitis, unspecified: Secondary | ICD-10-CM

## 2023-03-22 DIAGNOSIS — Z720 Tobacco use: Secondary | ICD-10-CM

## 2023-03-22 DIAGNOSIS — M25559 Pain in unspecified hip: Secondary | ICD-10-CM

## 2023-03-22 DIAGNOSIS — Z23 Encounter for immunization: Secondary | ICD-10-CM

## 2023-03-22 MED ORDER — CEPHALEXIN 500 MG PO CAPS: 500.0000 mg | ORAL_CAPSULE | Freq: Three times a day (TID) | ORAL | 0 refills | Status: DC

## 2023-03-22 NOTE — Progress Notes (Signed)
   Subjective:    Patient ID: Isabel Atkinson, female    DOB: 1966-02-27, 57 y.o.   MRN: [id number]  HPI Very nice patient She has had a very rough go over the past 9 months She started off with severe back buttock and groin pain. Initially was treated both by orthopedics ourselves and gynecology.  Then she had to go through a hysterectomy along with consultation with a back and hip specialist Then she had injection as well as physical therapy As a result of all this she has not been able to return to work until recently. She states she now is able to move around fairly well does have some residual pain in her back and into her groin but it is a 3 out of 10 whereas in the past it would get all the way up to a 10 out of 10 She states she is able to squat twist turn and bend and able to lift.  She has also had a recent sinus infection with sinus pressure pain discomfort took doxycycline did not help her she still has ongoing sinus pain and discomfort  She also has underlying COPD still smokes she has been encouraged to quit smoking been very difficult for her to do so she has tried medications in the past and they did not help and had negative side effects with it Return to work note from overall health , hip, hysterectomy  Rhinosinusitis follow up    Review of Systems     Objective:   Physical Exam Gen-NAD not toxic TMS-normal bilateral T- normal no redness Chest-CTA respiratory rate normal no crackles CV RRR no murmur Skin-warm dry Neuro-grossly normal Moderate sinus tenderness to percussion       Assessment & Plan:  1. Immunization due Today - Pneumococcal conjugate vaccine 20-valent (Prevnar 20)  2. Acute rhinosinusitis We will go ahead with Keflex 3 times daily for the next 10 days it is possible this could cause diarrhea patient is aware of this unfortunately she has negative side effects with most antibiotics so it does limit our choices that will not cause some  level of trouble if she has mucousy stools or bloody stools to notify us immediately  3. Tobacco use She has been counseled to quit smoking Very important to get away from this If necessary nicotine gum or nicotine patches Rationale discussed with patient  4. Sciatica of left side Still has ongoing pain discomfort occasionally has to use hydrocodone but this is sparing otherwise is recommended to use Tylenol or Aleve  5. Hip pain, unspecified laterality Gentle stretching exercises  It is felt that the patient may return to work on August 19 she has been out of work since October 3 Hopefully she will do well within the resuming the work environment if she does run into trouble she will notify us

## 2023-03-28 ENCOUNTER — Other Ambulatory Visit: Payer: Self-pay | Admitting: Family Medicine

## 2023-03-28 MED ORDER — IBUPROFEN 600 MG PO TABS: 600.0000 mg | ORAL_TABLET | Freq: Three times a day (TID) | ORAL | 1 refills | Status: AC | PRN

## 2023-03-28 MED ORDER — HYDROCODONE-ACETAMINOPHEN 10-325 MG PO TABS: ORAL_TABLET | ORAL | 0 refills | Status: DC

## 2023-03-31 ENCOUNTER — Telehealth: Payer: Self-pay

## 2023-03-31 NOTE — Telephone Encounter (Signed)
Patient dropped off document  Forms 703 for disability dropped off  , to be filled out by provider. Patient requested to send it back via Call Patient to pick up within 7-days. Document is located in providers tray at front office.Please advise at Mobile [phone] (mobile)

## 2023-04-03 NOTE — Telephone Encounter (Signed)
Front-1 form was completely filled out the other ones were signed and dated.  These were forwarded up to [facility] for finishing completion

## 2023-04-15 ENCOUNTER — Encounter: Payer: Self-pay | Admitting: Obstetrics & Gynecology

## 2023-04-15 ENCOUNTER — Ambulatory Visit: Payer: BC Managed Care – PPO | Admitting: Obstetrics & Gynecology

## 2023-04-15 ENCOUNTER — Other Ambulatory Visit (HOSPITAL_COMMUNITY)
Admission: RE | Admit: 2023-04-15 | Discharge: 2023-04-15 | Disposition: A | Discharge status: Home / Self Care | Payer: BC Managed Care – PPO | Source: Ambulatory Visit | Attending: Obstetrics & Gynecology | Admitting: Obstetrics & Gynecology

## 2023-04-15 VITALS — BP 139/78 | HR 84 | Ht 62.0 in | Wt 93.6 lb

## 2023-04-15 DIAGNOSIS — N941 Unspecified dyspareunia: Secondary | ICD-10-CM | POA: Diagnosis not present

## 2023-04-15 DIAGNOSIS — R102 Pelvic and perineal pain: Secondary | ICD-10-CM

## 2023-04-15 DIAGNOSIS — R300 Dysuria: Secondary | ICD-10-CM | POA: Diagnosis not present

## 2023-04-15 MED ORDER — NITROFURANTOIN MONOHYD MACRO 100 MG PO CAPS
100.0000 mg | ORAL_CAPSULE | Freq: Two times a day (BID) | ORAL | 0 refills | Status: AC
Start: 2023-04-15 — End: 2023-04-20

## 2023-04-15 NOTE — Progress Notes (Signed)
GYN VISIT Patient name: Isabel Atkinson MRN [id number]  Date of birth: 09/09/1965 Chief Complaint:   Pelvic Pain (Since Wednesday, some pain with urination)  History of Present Illness:   Isabel Atkinson is a 57 y.o. G30P2012 PH female being seen today for   Pelvic pain: Had intercourse on Wed- afterword had some bleeding and pain.  Last week had a yeast infection- used OTC monsitat 7.  She notes that she typically does have a little pale pink after sex, but this past time, she had some BRB.  She notes back and lower stomach pain- throbbing pain, 4/10.  She has not taken any medication though the other day took hydrocodone for her hip and also helped her pelvic pain.  +dysuria, +frequency.  She also notes slight vaginal discharge, no odor.  Using vaginal cream- every other day.  Does not seem to be helping with the dryness-   No fevers/chills.  No GI symptoms. No LMP recorded. Patient has had a hysterectomy.    Review of Systems:   Pertinent items are noted in HPI Denies fever/chills, dizziness, headaches, visual disturbances, fatigue, shortness of breath, chest pain, abdominal pain, vomiting. Pertinent History Reviewed:   Past Surgical History:  Procedure Laterality Date   CATARACT EXTRACTION W/PHACO Left 01/17/2017   Procedure: CATARACT EXTRACTION PHACO AND INTRAOCULAR LENS PLACEMENT (IOC);  Surgeon: Gemma Payor, MD;  Location: [facility];  Service: Ophthalmology;  Laterality: Left;  CDE: 6.92   CATARACT EXTRACTION W/PHACO Right 03/07/2020   Procedure: CATARACT EXTRACTION PHACO AND INTRAOCULAR LENS PLACEMENT RIGHT EYE;  Surgeon: Fabio Pierce, MD;  Location: [facility];  Service: Ophthalmology;  Laterality: Right;  CDE: 8.44   COLONOSCOPY  2013   RMR: Normal rectum. single diminutive sigmoid polyp noted above. Normal terminal ileum. Status post segmental biopsy. next TCS in 10 years.    COLONOSCOPY WITH PROPOFOL N/A 01/27/2022   Procedure: COLONOSCOPY WITH PROPOFOL;  Surgeon: Corbin Ade, MD;  [facility];  Service: Endoscopy;  Laterality: N/A;  12:45pm   ELBOW SURGERY Right 2023   ESOPHAGOGASTRODUODENOSCOPY  2013   RMR: Possible cervical esophageal web-status post dilation as described above. Small hiatal hernia. Status post biopsy of normal appearing small bowel to screen for celiac disease.    ESOPHAGOGASTRODUODENOSCOPY N/A 05/06/2016   normal esophagus, small hiatal hernia, empiric dilation.   ESOPHAGOGASTRODUODENOSCOPY (EGD) WITH PROPOFOL N/A 01/27/2022   Procedure: ESOPHAGOGASTRODUODENOSCOPY (EGD) WITH PROPOFOL;  Surgeon: Corbin Ade, MD;  [facility];  Service: Endoscopy;  Laterality: N/A;   EYE SURGERY Bilateral    Cat Sx - Symfony MF IOLs   MALONEY DILATION N/A 05/06/2016   Procedure: MALONEY DILATION;  Surgeon: Corbin Ade, MD;  [facility];  Service: Endoscopy;  Laterality: N/A;   MALONEY DILATION N/A 01/27/2022   Procedure: Elease Hashimoto DILATION;  Surgeon: Corbin Ade, MD;  [facility];  Service: Endoscopy;  Laterality: N/A;   NECK SURGERY  10/2012   OVARIAN CYST SURGERY  1997   removed   POLYPECTOMY  01/27/2022   Procedure: POLYPECTOMY;  Surgeon: Corbin Ade, MD;  [facility];  Service: Endoscopy;;  cecal   ROBOTIC ASSISTED LAPAROSCOPIC HYSTERECTOMY AND SALPINGECTOMY Bilateral 12/14/2022   Procedure: XI ROBOTIC ASSISTED LAPAROSCOPIC HYSTERECTOMY AND SALPINGO-OOPHORECTOMY;  Surgeon: Lazaro Arms, MD;  Location: [facility];  Service: Gynecology;  Laterality: Bilateral;   TUBAL LIGATION  2000   VAGINAL PROLAPSE REPAIR N/A 12/14/2022   Procedure: VAGINAL VAULT  SUSPENSION;  Surgeon: Lazaro Arms, MD;  Location: [facility];  Service: Gynecology;  Laterality: N/A;   YAG LASER APPLICATION Bilateral     Past Medical History:  Diagnosis Date   Anxiety    COPD (chronic obstructive pulmonary disease) (HCC)    Dysrhythmia    GERD (gastroesophageal reflux disease)    Hypercholesterolemia     Pre-diabetes    Reactive airway disease    Tachycardia    Reviewed problem list, medications and allergies. Physical Assessment:   Vitals:   04/15/23 0856  BP: 139/78  Pulse: 84  Weight: 93 lb 9.6 oz (42.5 kg)  Height: 5\' 2"  (1.575 m)  Body mass index is 17.12 kg/m.       Physical Examination:   General appearance: alert, well appearing, and in no distress  Psych: mood appropriate, normal affect  Skin: warm & dry   Cardiovascular: normal heart rate noted  Respiratory: normal respiratory effort, no distress  Abdomen: soft, +suprapubic tenderness, no rebound, no guarding Back: no CVA tenderness   Pelvic: VULVA: normal appearing vulva with no masses, tenderness or lesions, VAGINA: normal appearing vagina with normal color, minimal discharge.  No vaginal cuff dehiscence; however granulated tissues noted around entire cuff edge  Extremities: no edema, no calf tenderness bilaterally  Chaperone: Faith Rogue    Assessment & Plan:  1) Pelvic pain, suspected UTI -UA and culture obtained -will treat for UTI -ok to take OTC tylenol/ibuprofen -vaginitis panel also obtained -advised pelvic rest x 2 weeks -pt to f/u if symptoms do not improve  Meds ordered this encounter  Medications   nitrofurantoin, macrocrystal-monohydrate, (MACROBID) 100 MG capsule    Sig: Take 1 capsule (100 mg total) by mouth 2 (two) times daily for 5 days.    Dispense:  10 capsule    Refill:  0      Orders Placed This Encounter  Procedures   Urine Culture   Urinalysis    Return if symptoms worsen or fail to improve.   Myna Hidalgo, DO Attending Obstetrician & Gynecologist, [facility] for [employer], [facility]

## 2023-04-16 LAB — URINALYSIS
Bilirubin, UA: NEGATIVE
Glucose, UA: NEGATIVE
Ketones, UA: NEGATIVE
Nitrite, UA: NEGATIVE
Specific Gravity, UA: 1.02 (ref 1.005–1.030)
Urobilinogen, Ur: 0.2 mg/dL (ref 0.2–1.0)
pH, UA: 6.5 (ref 5.0–7.5)

## 2023-04-17 ENCOUNTER — Encounter: Payer: Self-pay | Admitting: Family Medicine

## 2023-04-17 LAB — URINE CULTURE

## 2023-04-17 NOTE — Progress Notes (Signed)
Please provide patient with a copy

## 2023-04-18 LAB — CERVICOVAGINAL ANCILLARY ONLY
Bacterial Vaginitis (gardnerella): NEGATIVE
Candida Glabrata: NEGATIVE
Candida Vaginitis: NEGATIVE
Comment: NEGATIVE
Comment: NEGATIVE
Comment: NEGATIVE

## 2023-04-20 ENCOUNTER — Other Ambulatory Visit: Payer: Self-pay | Admitting: Family Medicine

## 2023-04-20 ENCOUNTER — Other Ambulatory Visit: Payer: Self-pay

## 2023-04-20 MED ORDER — HYDROCODONE-ACETAMINOPHEN 10-325 MG PO TABS: ORAL_TABLET | ORAL | 0 refills | Status: DC

## 2023-04-28 ENCOUNTER — Encounter: Payer: Self-pay | Admitting: Family Medicine

## 2023-05-05 ENCOUNTER — Ambulatory Visit: Payer: BC Managed Care – PPO | Admitting: Obstetrics & Gynecology

## 2023-05-05 VITALS — Ht 62.0 in | Wt 94.0 lb

## 2023-05-05 DIAGNOSIS — N930 Postcoital and contact bleeding: Secondary | ICD-10-CM

## 2023-05-05 DIAGNOSIS — N952 Postmenopausal atrophic vaginitis: Secondary | ICD-10-CM

## 2023-05-05 NOTE — Progress Notes (Signed)
No chief complaint on file.     57 y.o. [id number] No LMP recorded. Patient has had a hysterectomy. The current method of family planning is status post hysterectomy.  Outpatient Encounter Medications as of 05/05/2023  Medication Sig Note   acetaminophen (TYLENOL) 500 MG tablet Take 500 mg by mouth every 6 (six) hours as needed for moderate pain or headache.    albuterol (VENTOLIN HFA) 108 (90 Base) MCG/ACT inhaler Inhale 2 puffs into the lungs every 4 (four) hours as needed for wheezing or shortness of breath.    ALPRAZolam (XANAX) 0.25 MG tablet TAKE 1/2 TO 1 TABLET BY MOUTH TWICE DAILY AS NEEDED FOR ANXIETY.    aspirin EC 81 MG tablet Take 81 mg by mouth daily. Swallow whole. 12/08/2022: On hold   Bromfenac Sodium (PROLENSA) 0.07 % SOLN Place 1 drop into the right eye daily.    clobetasol cream (TEMOVATE) 0.05 % Apply 1 Application topically 2 (two) times daily.    CRANBERRY PO Take 1 tablet by mouth daily. Gummy    cycloSPORINE (RESTASIS) 0.05 % ophthalmic emulsion Place 1 drop into both eyes 2 (two) times daily.    denosumab (PROLIA) 60 MG/ML SOSY injection Inject 60 mg into the skin every 6 (six) months.    EPINEPHRINE 0.3 mg/0.3 mL IJ SOAJ injection INJECT INTO OUTER THIGH AND HOLD AGAINST LEG FOR 10 SECONDS FOR SEVERE ALLERGIC REACTION.    estradiol (ESTRACE) 0.1 MG/GM vaginal cream Apply a pea size amount of cream to urethral area of vagina twice weekly    HYDROcodone-acetaminophen (NORCO) 10-325 MG tablet 1 every 6 hours as needed pain caution drowsiness    ibuprofen (ADVIL) 600 MG tablet Take 1 tablet (600 mg total) by mouth every 8 (eight) hours as needed.    Misc Natural Products (ELDERBERRY IMMUNE COMPLEX PO) Take 1 tablet by mouth daily. Gummy    Multiple Vitamins-Minerals (MULTIVITAMIN WITH MINERALS) tablet Take 1 tablet by mouth daily.     omeprazole (PRILOSEC) 40 MG capsule Take 1 capsule (40 mg total) by mouth daily as needed.    ondansetron (ZOFRAN-ODT) 8 MG  disintegrating tablet Take 8 mg by mouth every 8 (eight) hours as needed for nausea or vomiting.    phenazopyridine (PYRIDIUM) 100 MG tablet Take 1 tablet (100 mg total) by mouth 3 (three) times daily as needed for pain.    rosuvastatin (CRESTOR) 10 MG tablet Take 1 tablet (10 mg total) by mouth daily. (Patient taking differently: Take 10 mg by mouth 3 (three) times a week.)    Triamcinolone Acetonide (NASACORT ALLERGY 24HR NA) Place 2 sprays into the nose daily.    Vitamin D, Ergocalciferol, (DRISDOL) 1.25 MG (50000 UNIT) CAPS capsule Take 1 capsule (50,000 Units total) by mouth every 7 (seven) days.    estradiol (ESTRACE) 0.1 MG/GM vaginal cream 0.5 grams at bedtime every other night (Patient not taking: Reported on 04/15/2023)    No facility-administered encounter medications on file as of 05/05/2023.    Subjective S/P RA TLH BSO 12/14/22 Experienced post coital vaginal bleeding bright red Spontaneously resolved Associated lower pelvic cramping Pt does have atrophic vaginal changes and had prolapse but I did a vaginal vault suspension at the time of her hysterectomy  Past Medical History:  Diagnosis Date   Anxiety    COPD (chronic obstructive pulmonary disease) (HCC)    Dysrhythmia    GERD (gastroesophageal reflux disease)    Hypercholesterolemia    Pre-diabetes    Reactive airway disease  Tachycardia     Past Surgical History:  Procedure Laterality Date   CATARACT EXTRACTION W/PHACO Left 01/17/2017   Procedure: CATARACT EXTRACTION PHACO AND INTRAOCULAR LENS PLACEMENT (IOC);  Surgeon: Gemma Payor, MD;  Location: [facility];  Service: Ophthalmology;  Laterality: Left;  CDE: 6.92   CATARACT EXTRACTION W/PHACO Right 03/07/2020   Procedure: CATARACT EXTRACTION PHACO AND INTRAOCULAR LENS PLACEMENT RIGHT EYE;  Surgeon: Fabio Pierce, MD;  Location: [facility];  Service: Ophthalmology;  Laterality: Right;  CDE: 8.44   COLONOSCOPY  2013   RMR: Normal rectum. single diminutive sigmoid polyp noted  above. Normal terminal ileum. Status post segmental biopsy. next TCS in 10 years.    COLONOSCOPY WITH PROPOFOL N/A 01/27/2022   Procedure: COLONOSCOPY WITH PROPOFOL;  Surgeon: Corbin Ade, MD;  [facility]: [facility];  Service: Endoscopy;  Laterality: N/A;  12:45pm   ELBOW SURGERY Right 2023   ESOPHAGOGASTRODUODENOSCOPY  2013   RMR: Possible cervical esophageal web-status post dilation as described above. Small hiatal hernia. Status post biopsy of normal appearing small bowel to screen for celiac disease.    ESOPHAGOGASTRODUODENOSCOPY N/A 05/06/2016   normal esophagus, small hiatal hernia, empiric dilation.   ESOPHAGOGASTRODUODENOSCOPY (EGD) WITH PROPOFOL N/A 01/27/2022   Procedure: ESOPHAGOGASTRODUODENOSCOPY (EGD) WITH PROPOFOL;  Surgeon: Corbin Ade, MD;  [facility]: [facility];  Service: Endoscopy;  Laterality: N/A;   EYE SURGERY Bilateral    Cat Sx - Symfony MF IOLs   MALONEY DILATION N/A 05/06/2016   Procedure: MALONEY DILATION;  Surgeon: Corbin Ade, MD;  [facility];  Service: Endoscopy;  Laterality: N/A;   MALONEY DILATION N/A 01/27/2022   Procedure: Elease Hashimoto DILATION;  Surgeon: Corbin Ade, MD;  [facility]: [facility];  Service: Endoscopy;  Laterality: N/A;   NECK SURGERY  10/2012   OVARIAN CYST SURGERY  1997   removed   POLYPECTOMY  01/27/2022   Procedure: POLYPECTOMY;  Surgeon: Corbin Ade, MD;  [facility]: [facility];  Service: Endoscopy;;  cecal   ROBOTIC ASSISTED LAPAROSCOPIC HYSTERECTOMY AND SALPINGECTOMY Bilateral 12/14/2022   Procedure: XI ROBOTIC ASSISTED LAPAROSCOPIC HYSTERECTOMY AND SALPINGO-OOPHORECTOMY;  Surgeon: Lazaro Arms, MD;  Location: [facility];  Service: Gynecology;  Laterality: Bilateral;   TUBAL LIGATION  2000   VAGINAL PROLAPSE REPAIR N/A 12/14/2022   Procedure: VAGINAL VAULT SUSPENSION;  Surgeon: Lazaro Arms, MD;  Location: [facility];  Service: Gynecology;  Laterality: N/A;   YAG LASER APPLICATION Bilateral     OB History      Gravida  3   Para  2   Term  2   Preterm      AB  1   Living  2      SAB      IAB      Ectopic      Multiple      Live Births              Allergies  Allergen Reactions   Ciprofloxacin Other (See Comments)    Patient states with in five minutes of taking developed a splitting headache,itching all over and burning in mouth and lips.   Augmentin [Amoxicillin-Pot Clavulanate] Diarrhea   Carafate [Sucralfate]     Mouth tingling   Celexa [Citalopram Hydrobromide] Nausea And Vomiting   Chantix [Varenicline] Nausea And Vomiting   Sulfa Antibiotics Itching   Sulfur Itching   Wellbutrin [Bupropion]     Severe weight loss and loss of appetite   Fosamax [Alendronate Sodium] Nausea And  Vomiting    GERD can not tolerate   Gabapentin     Sleepy and did not help    Social History   Socioeconomic History   Marital status: Married    Spouse name: Not on file   Number of children: 2   Years of education: GED   Highest education level: 11th grade  Occupational History   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    Employer: [employer]  Tobacco Use   Smoking status: Every Day    Current packs/day: 1.00    Average packs/day: 1 pack/day for 25.0 years (25.0 ttl pk-yrs)    Types: Cigarettes   Smokeless tobacco: Never  Vaping Use   Vaping status: Never Used  Substance and Sexual Activity   Alcohol use: No    Alcohol/week: 0.0 standard drinks of alcohol   Drug use: No   Sexual activity: Yes    Birth control/protection: Post-menopausal, Surgical  Other Topics Concern   Not on file  Social History Narrative   Lives at home w/ her husband   2 children - ages 2 & 33   Right-sided   Caffeine: 2 beverages per day   Social Determinants of Health   Financial Resource Strain: Low Risk  (06/08/2022)   Overall Financial Resource Strain (CARDIA)    Difficulty of Paying Living Expenses: Not hard at all  Food Insecurity: No Food Insecurity (03/18/2023)   Hunger Vital Sign     Worried About Running Out of Food in the Last Year: Never true    Ran Out of Food in the Last Year: Never true  Transportation Needs: No Transportation Needs (03/18/2023)   PRAPARE - Administrator, Civil Service (Medical): No    Lack of Transportation (Non-Medical): No  Physical Activity: Unknown (03/18/2023)   Exercise Vital Sign    Days of Exercise per Week: Patient declined    Minutes of Exercise per Session: 10 min  Stress: No Stress Concern Present (03/18/2023)   [employer] of Occupational Health - Occupational Stress Questionnaire    Feeling of Stress : Not at all  Social Connections: Moderately Integrated (03/18/2023)   Social Connection and Isolation Panel [NHANES]    Frequency of Communication with Friends and Family: More than three times a week    Frequency of Social Gatherings with Friends and Family: Twice a week    Attends Religious Services: More than 4 times per year    Active Member of [employer] or Organizations: No    Attends Banker Meetings: Never    Marital Status: Married    Family History  Problem Relation Age of Onset   Colon cancer Maternal Grandfather 60   Colon polyps Mother 75   Colon cancer Maternal Uncle 28   Hypertension Father    Diabetes Father    Heart disease Father    Hyperlipidemia Father    Stroke Father     Medications:       Current Outpatient Medications:    acetaminophen (TYLENOL) 500 MG tablet, Take 500 mg by mouth every 6 (six) hours as needed for moderate pain or headache., Disp: , Rfl:    albuterol (VENTOLIN HFA) 108 (90 Base) MCG/ACT inhaler, Inhale 2 puffs into the lungs every 4 (four) hours as needed for wheezing or shortness of breath., Disp: 8 g, Rfl: 0   ALPRAZolam (XANAX) 0.25 MG tablet, TAKE 1/2 TO 1 TABLET BY MOUTH TWICE DAILY AS NEEDED FOR ANXIETY., Disp: 30 tablet, Rfl: 0   aspirin  EC 81 MG tablet, Take 81 mg by mouth daily. Swallow whole., Disp: , Rfl:    Bromfenac Sodium (PROLENSA) 0.07 % SOLN,  Place 1 drop into the right eye daily., Disp: 3 mL, Rfl: 10   clobetasol cream (TEMOVATE) 0.05 %, Apply 1 Application topically 2 (two) times daily., Disp: 30 g, Rfl: 0   CRANBERRY PO, Take 1 tablet by mouth daily. Gummy, Disp: , Rfl:    cycloSPORINE (RESTASIS) 0.05 % ophthalmic emulsion, Place 1 drop into both eyes 2 (two) times daily., Disp: , Rfl:    denosumab (PROLIA) 60 MG/ML SOSY injection, Inject 60 mg into the skin every 6 (six) months., Disp: 1 mL, Rfl: 1   EPINEPHRINE 0.3 mg/0.3 mL IJ SOAJ injection, INJECT INTO OUTER THIGH AND HOLD AGAINST LEG FOR 10 SECONDS FOR SEVERE ALLERGIC REACTION., Disp: 2 each, Rfl: 3   estradiol (ESTRACE) 0.1 MG/GM vaginal cream, Apply a pea size amount of cream to urethral area of vagina twice weekly, Disp: 42.5 g, Rfl: 3   HYDROcodone-acetaminophen (NORCO) 10-325 MG tablet, 1 every 6 hours as needed pain caution drowsiness, Disp: 20 tablet, Rfl: 0   ibuprofen (ADVIL) 600 MG tablet, Take 1 tablet (600 mg total) by mouth every 8 (eight) hours as needed., Disp: 3045 tablet, Rfl: 1   Misc Natural Products (ELDERBERRY IMMUNE COMPLEX PO), Take 1 tablet by mouth daily. Gummy, Disp: , Rfl:    Multiple Vitamins-Minerals (MULTIVITAMIN WITH MINERALS) tablet, Take 1 tablet by mouth daily. , Disp: , Rfl:    omeprazole (PRILOSEC) 40 MG capsule, Take 1 capsule (40 mg total) by mouth daily as needed., Disp: 90 capsule, Rfl: 1   ondansetron (ZOFRAN-ODT) 8 MG disintegrating tablet, Take 8 mg by mouth every 8 (eight) hours as needed for nausea or vomiting., Disp: , Rfl:    phenazopyridine (PYRIDIUM) 100 MG tablet, Take 1 tablet (100 mg total) by mouth 3 (three) times daily as needed for pain., Disp: 10 tablet, Rfl: 0   rosuvastatin (CRESTOR) 10 MG tablet, Take 1 tablet (10 mg total) by mouth daily. (Patient taking differently: Take 10 mg by mouth 3 (three) times a week.), Disp: 90 tablet, Rfl: 3   Triamcinolone Acetonide (NASACORT ALLERGY 24HR NA), Place 2 sprays into the nose  daily., Disp: , Rfl:    Vitamin D, Ergocalciferol, (DRISDOL) 1.25 MG (50000 UNIT) CAPS capsule, Take 1 capsule (50,000 Units total) by mouth every 7 (seven) days., Disp: 5 capsule, Rfl: 11   estradiol (ESTRACE) 0.1 MG/GM vaginal cream, 0.5 grams at bedtime every other night (Patient not taking: Reported on 04/15/2023), Disp: 30 g, Rfl: 12  Objective Height 5\' 2"  (1.575 m), weight 94 lb (42.6 kg).  Cuff intact No granulation tissue is seen Due to the vaginal apex suspension the vagina tapers which is both antomical and tissue related She has not been inserting the estrogen cream all the way up to the cuff but I thin she may have had some apex surgical surface adhesions she may have "broken" up with sex It feels a little thick but intact  Pertinent ROS No burning with urination, frequency or urgency No nausea, vomiting or diarrhea Nor fever chills or other constitutional symptoms   Labs or studies     Impression + Management Plan: Diagnoses this Encounter::   ICD-10-CM   1. Atrophic vaginitis Durenda Age to 1 gram nightly deeper in the vagina  N95.2     2. Bleeding after intercourse  N93.0      Re examine in 1  month to reassess the cuff and make sure there is no anterior to posterior "agglutination"   Medications prescribed during  this encounter: No orders of the defined types were placed in this encounter.   Labs or Scans Ordered during this encounter: No orders of the defined types were placed in this encounter.     Follow up Return in about 1 month (around 06/04/2023) for Follow up, with Dr Despina Hidden.

## 2023-05-08 NOTE — Telephone Encounter (Signed)
Front office-I did go ahead and complete this.  I did a letter.  Please print the letter and give to the patient or mail to the patient thank you

## 2023-05-12 ENCOUNTER — Other Ambulatory Visit: Payer: Self-pay | Admitting: Family Medicine

## 2023-05-26 LAB — COMPREHENSIVE METABOLIC PANEL
ALT: 19 [IU]/L (ref 0–32)
AST: 19 [IU]/L (ref 0–40)
Albumin: 4.5 g/dL (ref 3.8–4.9)
Alkaline Phosphatase: 69 [IU]/L (ref 44–121)
BUN/Creatinine Ratio: 22 (ref 9–23)
BUN: 13 mg/dL (ref 6–24)
Bilirubin Total: 0.5 mg/dL (ref 0.0–1.2)
CO2: 24 mmol/L (ref 20–29)
Calcium: 8.7 mg/dL (ref 8.7–10.2)
Chloride: 106 mmol/L (ref 96–106)
Creatinine, Ser: 0.6 mg/dL (ref 0.57–1.00)
Globulin, Total: 2 g/dL (ref 1.5–4.5)
Glucose: 87 mg/dL (ref 70–99)
Potassium: 4 mmol/L (ref 3.5–5.2)
Sodium: 144 mmol/L (ref 134–144)
Total Protein: 6.5 g/dL (ref 6.0–8.5)
eGFR: 103 mL/min/{1.73_m2} (ref >59)

## 2023-05-31 ENCOUNTER — Encounter: Payer: Self-pay | Admitting: Obstetrics & Gynecology

## 2023-05-31 ENCOUNTER — Ambulatory Visit: Payer: BC Managed Care – PPO | Admitting: Obstetrics & Gynecology

## 2023-05-31 VITALS — BP 126/69 | HR 76 | Ht 62.0 in | Wt 93.0 lb

## 2023-05-31 DIAGNOSIS — N952 Postmenopausal atrophic vaginitis: Secondary | ICD-10-CM

## 2023-06-01 ENCOUNTER — Ambulatory Visit: Payer: BC Managed Care – PPO | Admitting: "Endocrinology

## 2023-06-06 ENCOUNTER — Ambulatory Visit: Payer: BC Managed Care – PPO | Admitting: Family Medicine

## 2023-06-06 VITALS — BP 124/73 | HR 74 | Temp 98.8°F | Ht 62.0 in | Wt 91.8 lb

## 2023-06-06 DIAGNOSIS — Z23 Encounter for immunization: Secondary | ICD-10-CM | POA: Diagnosis not present

## 2023-06-06 DIAGNOSIS — M25559 Pain in unspecified hip: Secondary | ICD-10-CM | POA: Diagnosis not present

## 2023-06-06 DIAGNOSIS — Z720 Tobacco use: Secondary | ICD-10-CM

## 2023-06-06 DIAGNOSIS — Z716 Tobacco abuse counseling: Secondary | ICD-10-CM | POA: Diagnosis not present

## 2023-06-06 NOTE — Progress Notes (Signed)
Subjective:    Patient ID: Isabel Atkinson, female    DOB: 1966/05/10, 57 y.o.   MRN: [id number]  Discussed the use of AI scribe software for clinical note transcription with the patient, who gave verbal consent to proceed.  History of Present Illness   The patient, with a history of chronic hip pain, reports worsening discomfort with activities involving bending, squatting, and lifting. The pain intensifies with prolonged sitting. The patient has received two injections for pain management, but the relief has been limited and temporary. The patient is considering hip replacement surgery, as recommended by her orthopedic surgeon, Dr. Despina Hick , who believes this could significantly alleviate the patient's symptoms.  The patient describes the pain as originating in the groin and buttock area, extending down the side of the leg. She has been managing the pain with hydrocodone, the dosage of which varies depending on the severity of the pain. On particularly painful days, the patient may take up to one and a half tablets. The medication helps to make the pain more manageable and does not cause any adverse effects such as drowsiness or disorientation.  The patient also reports a history of smoking, currently at half a pack per day. She expresses a desire to quit, particularly after the planned hip replacement surgery, in the hope that improved mobility and reduced pain will aid in smoking cessation. The patient has previously tried Chantix and Wellbutrin for smoking cessation, but experienced nausea, vomiting, and loss of appetite.  The patient is also on cholesterol medication and an acid blocker, which she takes as needed. She has been on estrogen therapy as well. The patient's breathing is reported to be normal during physical activity. She is due for a flu vaccine and a cholesterol check, although a recent particle test showed good results.     Patient also had smoking addressed with tobacco  cessation.  Patient cannot tolerate Chantix or Wellbutrin We did discuss nicotine patches nicotine gum as a possibility Also discussed setting a quit date She has hip surgery coming up after hip surgery she states when she is doing better she will set a quit date preferably within the next 3 months of when she gets her hip surgery.  She has also been counseled that continued smoking increases risk of cancer COPD progression and heart disease/stroke    Review of Systems     Objective:    Physical Exam   CHEST: Lungs clear to auscultation. CARDIOVASCULAR: Heart sounds normal.     General-in no acute distress Eyes-no discharge Lungs-respiratory rate normal, CTA CV-no murmurs,RRR Extremities skin warm dry no edema Neuro grossly normal Behavior normal, alert       Assessment & Plan:  Assessment and Plan    Hip Pain Severe pain exacerbated by bending, squatting, and lifting. Pain radiates to the groin and buttock. Pain is partially controlled with hydrocodone. Discussed with orthopedic surgeon about hip replacement surgery. -Continue hydrocodone as needed for pain control. -Plan for hip replacement surgery, date to be determined. -Post-surgery, aim to taper off pain medication.  Tobacco Use Patient continues to smoke half a pack per day. Discussed setting a quit date and potential use of nicotine patches. -Encourage setting a quit date and consider nicotine patches if needed.  General Health Maintenance -Administer influenza vaccine today. -Order lung cancer screening test. -Check cholesterol levels, last checked over a year ago.     Tobacco cessation counseling today patient was told about the negative consequences of smoking.  Patient  is cut back to half pack a day.  She does have a strategy in place to set a quit date after she has his first shoulder surgery We will set her up for lung cancer screening   1. Hip pain, unspecified laterality She is looking at surgery coming  up see discussion above  2. Tobacco use She will do lung cancer screening through [facility].  She was counseled regarding quitting smoking she will try nicotine patches and nicotine gum she will set a quit date after her hip surgery  3. Encounter for tobacco use cessation counseling See discussion above  4. Immunization due Today - Flu vaccine trivalent PF, 6mos and older(Flulaval,Afluria,Fluarix,Fluzone) Refill of pain medicine given the goal is for her to get to the point of stopping the pain medicine her plans are to work hard at quitting smoking getting past her hip surgery and then ceasing the use of pain medication as well

## 2023-06-07 NOTE — Addendum Note (Signed)
Addended byTrinidad Curet on: 06/07/2023 08:43 AM   Modules accepted: Orders

## 2023-06-08 ENCOUNTER — Encounter: Payer: Self-pay | Admitting: Family Medicine

## 2023-06-08 ENCOUNTER — Other Ambulatory Visit: Payer: Self-pay | Admitting: Family Medicine

## 2023-06-08 MED ORDER — HYDROCODONE-ACETAMINOPHEN 10-325 MG PO TABS: 1.0000 | ORAL_TABLET | Freq: Four times a day (QID) | ORAL | 0 refills | Status: DC | PRN

## 2023-06-14 ENCOUNTER — Ambulatory Visit: Payer: BC Managed Care – PPO

## 2023-06-14 VITALS — BP 104/70 | Ht 62.0 in | Wt 91.2 lb

## 2023-06-14 DIAGNOSIS — M818 Other osteoporosis without current pathological fracture: Secondary | ICD-10-CM | POA: Diagnosis not present

## 2023-06-14 MED ORDER — DENOSUMAB 60 MG/ML ~~LOC~~ SOSY
60.0000 mg | PREFILLED_SYRINGE | Freq: Once | SUBCUTANEOUS | Status: AC
  Administered 2023-06-14: 60 mg via SUBCUTANEOUS

## 2023-06-14 NOTE — Progress Notes (Signed)
Pt seen for nurses visit for prolia injection. Pt provided prolia. Prolia 60mg  SQ injected in upper L arm without difficulty. Lot # [id number], Expiration Date 12/06/2025. Pt waited 15 minutes post injection, no reaction noted.

## 2023-06-19 ENCOUNTER — Telehealth: Payer: Self-pay | Admitting: Family Medicine

## 2023-06-19 NOTE — Telephone Encounter (Signed)
Nurses Recently I received statement from her pharmacy regarding a prior approval request regarding hydrocodone.  We are filling out this form but this phone message is to make sure that you notify her that because of the frequency of the prescriptions for hydrocodone we will need to place her under chronic pain management contract which is at every 62-month visit It also includes yearly urine drug screen And also reviewing and signing a pain contract  All of these things are now required under [facility] law/medical board/DEA for anybody who is receiving narcotics even if it is intermittently throughout the year  Therefore she will need to have a office visit preferably in November to address this  (I do not see where she is received a pain management contract or urine drug screen, she has had multiple short prescriptions for pain medications over the course of the past several months)

## 2023-06-20 NOTE — Telephone Encounter (Signed)
Disregard- Pharmacy stated patient paid cash for script and picked it up 06/09/23

## 2023-06-24 ENCOUNTER — Other Ambulatory Visit: Payer: Self-pay

## 2023-06-24 ENCOUNTER — Encounter: Payer: Self-pay | Admitting: Family Medicine

## 2023-06-24 MED ORDER — HYDROCODONE-ACETAMINOPHEN 10-325 MG PO TABS: 1.0000 | ORAL_TABLET | Freq: Four times a day (QID) | ORAL | 0 refills | Status: DC | PRN

## 2023-06-27 ENCOUNTER — Other Ambulatory Visit: Payer: Self-pay

## 2023-06-27 ENCOUNTER — Telehealth: Payer: Self-pay

## 2023-06-27 DIAGNOSIS — Z87891 Personal history of nicotine dependence: Secondary | ICD-10-CM

## 2023-06-27 DIAGNOSIS — Z122 Encounter for screening for malignant neoplasm of respiratory organs: Secondary | ICD-10-CM

## 2023-06-27 DIAGNOSIS — F17210 Nicotine dependence, cigarettes, uncomplicated: Secondary | ICD-10-CM

## 2023-06-27 NOTE — Telephone Encounter (Signed)
Called and spoke with patient. States she is at work and cannot complete LCS at this time. RN to call back after 4pm.

## 2023-06-30 ENCOUNTER — Ambulatory Visit: Payer: BC Managed Care – PPO | Admitting: Acute Care

## 2023-06-30 DIAGNOSIS — F17210 Nicotine dependence, cigarettes, uncomplicated: Secondary | ICD-10-CM | POA: Diagnosis not present

## 2023-06-30 NOTE — Progress Notes (Signed)
 Virtual Visit via Telephone Note  I connected with Isabel Atkinson on 06/30/23 at  4:00 PM EST by telephone and verified that I am speaking with the correct person using two identifiers.  Location: Patient: in home Provider: [address], [place], [place], [address]   Shared Decision Making Visit Lung Cancer Screening Program [phone])   Eligibility: Age 58 y.o. Pack Years Smoking History Calculation 42 (# packs/per year x # years smoked) Recent History of coughing up blood  no Unexplained weight loss? no ( >Than 15 pounds within the last 6 months ) Prior History Lung / other cancer no (Diagnosis within the last 5 years already requiring surveillance chest CT Scans). Smoking Status Current Smoker Former Smokers: Years since quit:  NA  Quit Date: NA  Visit Components: Discussion included one or more decision making aids. yes Discussion included risk/benefits of screening. yes Discussion included potential follow up diagnostic testing for abnormal scans. yes Discussion included meaning and risk of over diagnosis. yes Discussion included meaning and risk of False Positives. yes Discussion included meaning of total radiation exposure. yes  Counseling Included: Importance of adherence to annual lung cancer LDCT screening. yes Impact of comorbidities on ability to participate in the program. yes Ability and willingness to under diagnostic treatment. yes  Smoking Cessation Counseling: Current Smokers:  Discussed importance of smoking cessation. yes Information about tobacco cessation classes and interventions provided to patient. yes Patient provided with "ticket" for LDCT Scan. yes Symptomatic Patient. no  Counseling NA Diagnosis Code: Tobacco Use Z72.0 Asymptomatic Patient yes  Counseling (Intermediate counseling: > three minutes counseling) U0454 Former Smokers:  Discussed the importance of maintaining cigarette abstinence. yes Diagnosis Code: Personal History of  Nicotine Dependence. U98.119 Information about tobacco cessation classes and interventions provided to patient. Yes Patient provided with "ticket" for LDCT Scan. yes Written Order for Lung Cancer Screening with LDCT placed in Epic. Yes (CT Chest Lung Cancer Screening Low Dose W/O CM) [id number] Z12.2-Screening of respiratory organs Z87.891-Personal history of nicotine dependence   Karl Bales, RN 06/30/23

## 2023-06-30 NOTE — Telephone Encounter (Signed)
Nurses  You may share this message with Noemi  I did look through the notes on October 28 we did do tobacco cessation counseling as part of her office visit We also filed the office visit as well as the CPT code for tobacco cessation I am not sure what else we can do in addition to that If there is a form that they need filled out I will need to be given that form If it is more of a matter of [place] filing the CPT code this was done  There are some options to handle this issue I would encourage that she talk with her insurance company to find out which 1 of these options they recommend or she can just choose and we will move forward  Option #1 I can dictate a letter stating that this in fact was completed on the office visit in October  Option #2 we will do a distinct office visit with myself within the next 2 weeks based on open slot that is solely devoted to tobacco cessation and no other issues and file it specifically as tobacco cessation so that it does not get filed at the same time as a standard office visit perhaps that will help  Please see which she would like to do then lets move forward

## 2023-06-30 NOTE — Patient Instructions (Signed)

## 2023-07-01 ENCOUNTER — Telehealth: Payer: Self-pay | Admitting: *Deleted

## 2023-07-01 ENCOUNTER — Telehealth: Payer: Self-pay

## 2023-07-01 NOTE — Telephone Encounter (Unsigned)
Copied from CRM [id number]. Topic: General - Other >> Jul 01, 2023  2:45 PM Alcus Dad H wrote: Reason for CRM: Pt called back in regards to her visit back in October for tobacco cessation, she would like to go with option 1, per Dr. Lorin Picket, "I can dictate a letter stating that this in fact was completed on the office visit in October". She needs this by November 30th. When it's done, she would like a call and she can come get the letter

## 2023-07-01 NOTE — Telephone Encounter (Signed)
   Pre-operative Risk Assessment    Patient Name: Isabel Atkinson  DOB: 17-Nov-1965 MRN: [id number]     Request for Surgical Clearance    Procedure:  removal of implant #12 with site preservation and replacement   Date of Surgery:  Clearance TBD                                 Surgeon:  not listed  Surgeon's Group or Practice Name:  [facility]  Phone number:  [phone] Fax number:  [phone]   Type of Clearance Requested:   - Medical    Type of Anesthesia:  MAC   Additional requests/questions:    Isabel Atkinson   07/01/2023, 1:33 PM

## 2023-07-03 ENCOUNTER — Encounter: Payer: Self-pay | Admitting: Family Medicine

## 2023-07-03 NOTE — Telephone Encounter (Signed)
I did dictate a letter Please provide this letter to the patient If she needs anything additional please let me know thank you

## 2023-07-03 NOTE — Progress Notes (Signed)
Please see telephone message 

## 2023-07-04 ENCOUNTER — Ambulatory Visit: Payer: BC Managed Care – PPO | Admitting: Family Medicine

## 2023-07-04 NOTE — Telephone Encounter (Signed)
Letter was dictated Front-please assist patient with getting her a letter Please have her read over the letter if she has any questions or if it needs to be changed let me know  (I can easily sign the letter once printed)

## 2023-07-04 NOTE — Telephone Encounter (Signed)
Dr. Rayfield Citizen office is calling to follow up on clearance request.

## 2023-07-05 NOTE — Telephone Encounter (Signed)
    Primary Cardiologist: None  Chart reviewed as part of pre-operative protocol coverage. Simple dental extractions are considered low risk procedures per guidelines and generally do not require any specific cardiac clearance. It is also generally accepted that for simple extractions and dental cleanings, there is no need to interrupt blood thinner therapy.   SBE prophylaxis is not required for the patient.  I will route this recommendation to the requesting party via Epic fax function and remove from pre-op pool.  Please call with questions.  Sharlene Dory, PA-C 07/05/2023, 4:43 PM

## 2023-07-06 ENCOUNTER — Ambulatory Visit (HOSPITAL_COMMUNITY): Admission: RE | Admit: 2023-07-06 | Payer: BC Managed Care – PPO | Source: Ambulatory Visit

## 2023-07-12 ENCOUNTER — Ambulatory Visit: Payer: BC Managed Care – PPO | Admitting: Family Medicine

## 2023-07-12 VITALS — BP 132/81 | HR 78 | Temp 98.2°F | Ht 62.0 in | Wt 95.2 lb

## 2023-07-12 DIAGNOSIS — Z79891 Long term (current) use of opiate analgesic: Secondary | ICD-10-CM

## 2023-07-12 MED ORDER — HYDROCODONE-ACETAMINOPHEN 10-325 MG PO TABS: ORAL_TABLET | ORAL | 0 refills | Status: DC

## 2023-07-12 NOTE — Progress Notes (Signed)
Subjective:    Patient ID: Isabel Atkinson, female    DOB: 11-01-1965, 57 y.o.   MRN: [id number]  HPI This patient was seen today for chronic pain  The medication list was reviewed and updated.   Location of Pain for which the patient has been treated with regarding narcotics: Pelvic pain back pain radiates into the leg and into the groin  Onset of this pain: Has had this for approximately a year   -Compliance with medication: Takes a half or a whole tablet once or twice a day on some days none  - Number patient states they take daily: Averaging about 75 every 90 days  -Reason for ongoing use of opioids pain is severe not controlled with Tylenol or ibuprofen  What other measures have been tried outside of opioids she is seeing orthopedist, gynecologist, had a hysterectomy, has had MRIs of the back, seeing Ortho for her hip, had MRI of the hip, has had physical therapy  In the ongoing specialists regarding this condition back specialist, orthopedic specialist, gynecologist  -when was the last dose patient took?  Patient is out currently  The patient was advised the importance of maintaining medication and not using illegal substances with these.  Here for refills and follow up  The patient was educated that we can provide 3 monthly scripts for their medication, it is their responsibility to follow the instructions.  Side effects or complications from medications: Denies side effects  Patient is aware that pain medications are meant to minimize the severity of the pain to allow their pain levels to improve to allow for better function. They are aware of that pain medications cannot totally remove their pain.  Due for UDT ( at least once per year) (pain management contract is also completed at the time of the UDT): Today  Scale of 1 to 10 ( 1 is least 10 is most) Your pain level without the medicine: 9 Your pain level with medication 6  Scale 1 to 10 ( 1-helps very little,  10 helps very well) How well does your pain medication reduce your pain so you can function better through out the day? 8  Quality of the pain: Throbbing aching  Persistence of the pain: Comes and goes but present almost every day  Modifying factors: Worse with activity  Patient denies a history of drug abuse Has not been on pain medicines before Does not have depression No history of drug abuse        Review of Systems     Objective:   Physical Exam General-in no acute distress Eyes-no discharge Lungs-respiratory rate normal, CTA CV-no murmurs,RRR Extremities skin warm dry no edema Neuro grossly normal Behavior normal, alert        Assessment & Plan:  The patient was seen in followup for chronic pain. A review over at their current pain status was discussed. Drug registry was checked. Prescriptions were given.  Regular follow-up recommended. Discussion was held regarding the importance of compliance with medication as well as pain medication contract.  Patient was informed that medication may cause drowsiness and should not be combined  with other medications/alcohol or street drugs. If the patient feels medication is causing altered alertness then do not drive or operate dangerous equipment.  Should be noted that the patient appears to be meeting appropriate use of opioids and response.  Evidenced by improved function and decent pain control without significant side effects and no evidence of overt aberrancy issues.  Upon  discussion with the patient today they understand that opioid therapy is optional and they feel that the pain has been refractory to reasonable conservative measures and is significant and affecting quality of life enough to warrant ongoing therapy and wishes to continue opioids.  Refills were provided.  [facility] medical Board guidelines regarding the pain medicine has been reviewed.  CDC guidelines most updated 2022 has been reviewed by the  prescriber.  PDMP is checked on a regular basis yearly urine drug screen and pain management contract  We did discuss benefits and risk of pain medicine We also discussed how that does not totally take the pain away instead it takes the edge off the pain We also discussed how it can be addictive ways to recognize addiction And the patient did want referral to a pain management center we will be happy to do so Currently she would like to have [place] manage her pain medicine We have also discussed how we have to keep pain medication to a low degree if needing the larger amounts than what we can prescribe we will refer her to pain management.

## 2023-07-14 ENCOUNTER — Other Ambulatory Visit: Payer: Self-pay | Admitting: Family Medicine

## 2023-07-14 ENCOUNTER — Encounter: Payer: Self-pay | Admitting: Family Medicine

## 2023-07-14 LAB — TOXASSURE SELECT 13 (MW), URINE

## 2023-07-18 ENCOUNTER — Telehealth: Payer: Self-pay

## 2023-07-18 NOTE — Telephone Encounter (Signed)
Tried to call Kelle Darting with [facility] on 07/13/23 and also this morning but did not receive an answer and was unable to leave a message.

## 2023-07-19 ENCOUNTER — Ambulatory Visit (HOSPITAL_COMMUNITY)
Admission: RE | Admit: 2023-07-19 | Discharge: 2023-07-19 | Disposition: A | Discharge status: Home / Self Care | Payer: BC Managed Care – PPO | Source: Ambulatory Visit | Attending: Acute Care | Admitting: Acute Care

## 2023-07-19 ENCOUNTER — Encounter: Payer: Self-pay | Admitting: "Endocrinology

## 2023-07-19 DIAGNOSIS — F17210 Nicotine dependence, cigarettes, uncomplicated: Secondary | ICD-10-CM | POA: Diagnosis present

## 2023-07-19 DIAGNOSIS — Z122 Encounter for screening for malignant neoplasm of respiratory organs: Secondary | ICD-10-CM

## 2023-07-19 DIAGNOSIS — Z87891 Personal history of nicotine dependence: Secondary | ICD-10-CM | POA: Diagnosis present

## 2023-07-20 ENCOUNTER — Telehealth: Payer: Self-pay

## 2023-07-20 NOTE — Telephone Encounter (Signed)
Left a message requesting Kelle Darting with [facility] to return a call to the office.

## 2023-07-26 ENCOUNTER — Telehealth: Payer: Self-pay

## 2023-07-26 NOTE — Telephone Encounter (Signed)
Left a message requesting Kelle Darting with [facility] to return call to the office.

## 2023-07-27 NOTE — Telephone Encounter (Signed)
Spoke with Kelle Darting from [facility] [place]. He stated pt has an implant that is very loose and "hanging by a thread" that needs to be removed with site preservation and replaced in the future. States they are concerned of the possibility that pt could aspirate implant and requested recommendations since pt is taking prolia. Olivia Mackie that if the procedure will not involve a deep dissection and exposure of bone procedure can proceed as scheduled. Also made him aware the there is no "drug holiday" for prolia but expert's opinion states pt may delay Prolia injection by 2-3 weeks of original due date per Dr.Nida. Understanding voiced.

## 2023-08-01 ENCOUNTER — Other Ambulatory Visit: Payer: Self-pay

## 2023-08-01 DIAGNOSIS — Z122 Encounter for screening for malignant neoplasm of respiratory organs: Secondary | ICD-10-CM

## 2023-08-01 DIAGNOSIS — F17210 Nicotine dependence, cigarettes, uncomplicated: Secondary | ICD-10-CM

## 2023-08-01 DIAGNOSIS — Z87891 Personal history of nicotine dependence: Secondary | ICD-10-CM

## 2023-08-09 ENCOUNTER — Ambulatory Visit: Payer: BC Managed Care – PPO | Admitting: Family Medicine

## 2023-08-09 VITALS — BP 115/75 | HR 76 | Temp 98.1°F | Ht 62.0 in | Wt 95.2 lb

## 2023-08-09 DIAGNOSIS — E782 Mixed hyperlipidemia: Secondary | ICD-10-CM | POA: Diagnosis not present

## 2023-08-09 DIAGNOSIS — I2510 Atherosclerotic heart disease of native coronary artery without angina pectoris: Secondary | ICD-10-CM | POA: Diagnosis not present

## 2023-08-09 DIAGNOSIS — R252 Cramp and spasm: Secondary | ICD-10-CM

## 2023-08-09 DIAGNOSIS — M25551 Pain in right hip: Secondary | ICD-10-CM

## 2023-08-09 DIAGNOSIS — G894 Chronic pain syndrome: Secondary | ICD-10-CM | POA: Diagnosis not present

## 2023-08-09 MED ORDER — HYDROCODONE-ACETAMINOPHEN 10-325 MG PO TABS: ORAL_TABLET | ORAL | 0 refills | Status: DC

## 2023-08-09 NOTE — Progress Notes (Signed)
   Subjective:    Patient ID: Isabel Atkinson, female    DOB: Dec 21, 1965, 57 y.o.   MRN: [id number]  HPI Patient with chronic pain in the right hip area most recent visit with orthopedist show that it does not need surgery There going to do a arthroscopy and hopefully see if she needs to have some sort of specialized procedure She does take pain medication 1 tablet/day sometimes 2 tablets in a day she denies drowsiness with medicine does not drive with the medicine Her most recent prescription lasted her 30 days She understands that pain medication is an option that is not mandatory Patient chooses to use her pain medicine but agrees to keep in limited quantities She does use Tylenol  and ibuprofen  when necessary She is able to return to her work but has a difficult time with squatting lifting pushing pulling but currently right now been able to work okay Patient was encouraged not to use other pain medications with this she had used the old prescription of tramadol  from 2023 she is stopped using this issue   Review of Systems     Objective:   Physical Exam General-in no acute distress Eyes-no discharge Lungs-respiratory rate normal, CTA CV-no murmurs,RRR Extremities skin warm dry no edema Neuro grossly normal Behavior normal, alert        Assessment & Plan:  1. Mixed hyperlipidemia (Primary) Check lab work Important to get LDL below 70 and if possible below 55 - Lipid Panel  2. Muscle cramp Check potassium muscle cramps left calf and left foot stretching recommended - Potassium  3. Coronary artery calcification Seen on most recent CT scan not having any symptoms with it patient will check her lab work - Magnesium   4. Chronic pain syndrome The patient was seen in followup for chronic pain. A review over at their current pain status was discussed. Drug registry was checked. Prescriptions were given.  Regular follow-up recommended. Discussion was held regarding the  importance of compliance with medication as well as pain medication contract.  Patient was informed that medication may cause drowsiness and should not be combined  with other medications/alcohol or street drugs. If the patient feels medication is causing altered alertness then do not drive or operate dangerous equipment.  Should be noted that the patient appears to be meeting appropriate use of opioids and response.  Evidenced by improved function and decent pain control without significant side effects and no evidence of overt aberrancy issues.  Upon discussion with the patient today they understand that opioid therapy is optional and they feel that the pain has been refractory to reasonable conservative measures and is significant and affecting quality of life enough to warrant ongoing therapy and wishes to continue opioids.  Refills were provided.  [place]  medical Board guidelines regarding the pain medicine has been reviewed.  CDC guidelines most updated 2022 has been reviewed by the prescriber.  PDMP is checked on a regular basis yearly urine drug screen and pain management contract Shared discussion patient would like to continue pain medication we will go 32 tablets/month  5. Right hip pain Sees specialist as discussed above Follow-up 3 months

## 2023-08-13 LAB — LIPID PANEL
Chol/HDL Ratio: 2.7 {ratio} (ref 0.0–4.4)
Cholesterol, Total: 182 mg/dL (ref 100–199)
HDL: 67 mg/dL (ref >39)
LDL Chol Calc (NIH): 98 mg/dL (ref 0–99)
Triglycerides: 97 mg/dL (ref 0–149)
VLDL Cholesterol Cal: 17 mg/dL (ref 5–40)

## 2023-08-13 LAB — POTASSIUM: Potassium: 4 mmol/L (ref 3.5–5.2)

## 2023-08-13 LAB — MAGNESIUM: Magnesium: 2.1 mg/dL (ref 1.6–2.3)

## 2023-08-23 ENCOUNTER — Encounter: Payer: Self-pay | Admitting: Internal Medicine

## 2023-08-23 ENCOUNTER — Encounter: Payer: Self-pay | Admitting: Family Medicine

## 2023-08-24 NOTE — Telephone Encounter (Signed)
 Looks like she had lipids checked by DrLuking IF she is taking Vit D she should have it checked

## 2023-08-31 ENCOUNTER — Ambulatory Visit: Payer: BC Managed Care – PPO | Admitting: "Endocrinology

## 2023-09-01 ENCOUNTER — Ambulatory Visit: Payer: 59 | Admitting: Obstetrics & Gynecology

## 2023-09-01 ENCOUNTER — Encounter: Payer: Self-pay | Admitting: Obstetrics & Gynecology

## 2023-09-01 VITALS — BP 131/75 | HR 79 | Ht 62.0 in | Wt 97.0 lb

## 2023-09-01 DIAGNOSIS — N952 Postmenopausal atrophic vaginitis: Secondary | ICD-10-CM | POA: Diagnosis not present

## 2023-09-01 NOTE — Progress Notes (Signed)
Follow up appointment: Spotting associated with sex post op  Chief Complaint  Patient presents with   Follow-up    Atrophic vaginitis seems better    Blood pressure 131/75, pulse 79, height 5\' 2"  (1.575 m), weight 97 lb (44 kg).  Cuff examined and is healing well  Non tender  No granulation tissue is noted Because of her VV suspension her cuff comes to a conical point which may have casued the intiail bleeding, but all tissue looks healthy  Very good estrogen effect  MEDS ordered this encounter: No orders of the defined types were placed in this encounter.   Orders for this encounter: No orders of the defined types were placed in this encounter.   Impression + Management Plan   ICD-10-CM   1. Atrophic vaginitis maintainence therapy 1/2 gram qohs  N95.2    much much improved tissue, intercourse is good, no bleeding      Follow Up: Return in about 1 year (around 08/31/2024) for Follow up, with Dr Despina Hidden.     All questions were answered.  Past Medical History:  Diagnosis Date   Anxiety    COPD (chronic obstructive pulmonary disease) (HCC)    Dysrhythmia    GERD (gastroesophageal reflux disease)    Hypercholesterolemia    Pre-diabetes    Reactive airway disease    Tachycardia     Past Surgical History:  Procedure Laterality Date   CATARACT EXTRACTION W/PHACO Left 01/17/2017   Procedure: CATARACT EXTRACTION PHACO AND INTRAOCULAR LENS PLACEMENT (IOC);  Surgeon: Gemma Payor, MD;  Location: [facility];  Service: Ophthalmology;  Laterality: Left;  CDE: 6.92   CATARACT EXTRACTION W/PHACO Right 03/07/2020   Procedure: CATARACT EXTRACTION PHACO AND INTRAOCULAR LENS PLACEMENT RIGHT EYE;  Surgeon: Fabio Pierce, MD;  Location: [facility];  Service: Ophthalmology;  Laterality: Right;  CDE: 8.44   COLONOSCOPY  2013   RMR: Normal rectum. single diminutive sigmoid polyp noted above. Normal terminal ileum. Status post segmental biopsy. next TCS in 10 years.    COLONOSCOPY WITH PROPOFOL  N/A 01/27/2022   Procedure: COLONOSCOPY WITH PROPOFOL;  Surgeon: Corbin Ade, MD;  Location: [facility];  Service: Endoscopy;  Laterality: N/A;  12:45pm   ELBOW SURGERY Right 2023   ESOPHAGOGASTRODUODENOSCOPY  2013   RMR: Possible cervical esophageal web-status post dilation as described above. Small hiatal hernia. Status post biopsy of normal appearing small bowel to screen for celiac disease.    ESOPHAGOGASTRODUODENOSCOPY N/A 05/06/2016   normal esophagus, small hiatal hernia, empiric dilation.   ESOPHAGOGASTRODUODENOSCOPY (EGD) WITH PROPOFOL N/A 01/27/2022   Procedure: ESOPHAGOGASTRODUODENOSCOPY (EGD) WITH PROPOFOL;  Surgeon: Corbin Ade, MD;  [facility]: [facility];  Service: Endoscopy;  Laterality: N/A;   EYE SURGERY Bilateral    Cat Sx - Symfony MF IOLs   MALONEY DILATION N/A 05/06/2016   Procedure: MALONEY DILATION;  Surgeon: Corbin Ade, MD;  [facility]: [facility];  Service: Endoscopy;  Laterality: N/A;   MALONEY DILATION N/A 01/27/2022   Procedure: Elease Hashimoto DILATION;  Surgeon: Corbin Ade, MD;  Location: [facility];  Service: Endoscopy;  Laterality: N/A;   NECK SURGERY  10/2012   OVARIAN CYST SURGERY  1997   removed   POLYPECTOMY  01/27/2022   Procedure: POLYPECTOMY;  Surgeon: Corbin Ade, MD;  Location: [facility];  Service: Endoscopy;;  cecal   ROBOTIC ASSISTED LAPAROSCOPIC HYSTERECTOMY AND SALPINGECTOMY Bilateral 12/14/2022   Procedure: XI ROBOTIC ASSISTED LAPAROSCOPIC HYSTERECTOMY AND SALPINGO-OOPHORECTOMY;  Surgeon: Lazaro Arms, MD;  Location: [facility];  Service: Gynecology;  Laterality: Bilateral;   TUBAL LIGATION  2000   VAGINAL PROLAPSE REPAIR N/A 12/14/2022   Procedure: VAGINAL VAULT SUSPENSION;  Surgeon: Lazaro Arms, MD;  Location: [facility];  Service: Gynecology;  Laterality: N/A;   YAG LASER APPLICATION Bilateral     OB History     Gravida  3   Para  2   Term  2   Preterm      AB  1   Living  2      SAB      IAB      Ectopic       Multiple      Live Births              Allergies  Allergen Reactions   Ciprofloxacin Other (See Comments)    Patient states with in five minutes of taking developed a splitting headache,itching all over and burning in mouth and lips.   Augmentin [Amoxicillin-Pot Clavulanate] Diarrhea   Carafate [Sucralfate]     Mouth tingling   Celexa [Citalopram Hydrobromide] Nausea And Vomiting   Chantix [Varenicline] Nausea And Vomiting   Sulfa Antibiotics Itching   Sulfur Itching   Wellbutrin [Bupropion]     Severe weight loss and loss of appetite   Fosamax [Alendronate Sodium] Nausea And Vomiting    GERD can not tolerate   Gabapentin     Sleepy and did not help    Social History   Socioeconomic History   Marital status: Married    Spouse name: Not on file   Number of children: 2   Years of education: GED   Highest education level: 11th grade  Occupational History   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    Employer: [employer]  Tobacco Use   Smoking status: Every Day    Current packs/day: 1.00    Average packs/day: 1 pack/day for 25.0 years (25.0 ttl pk-yrs)    Types: Cigarettes   Smokeless tobacco: Never  Vaping Use   Vaping status: Never Used  Substance and Sexual Activity   Alcohol use: No    Alcohol/week: 0.0 standard drinks of alcohol   Drug use: No   Sexual activity: Yes    Birth control/protection: Post-menopausal, Surgical    Comment: hysterstectomy  Other Topics Concern   Not on file  Social History Narrative   Lives at home w/ her husband   2 children - ages 65 & 67   Right-sided   Caffeine: 2 beverages per day   Social Drivers of Corporate investment banker Strain: Low Risk  (06/08/2022)   Overall Financial Resource Strain (CARDIA)    Difficulty of Paying Living Expenses: Not hard at all  Food Insecurity: No Food Insecurity (03/18/2023)   Hunger Vital Sign    Worried About Running Out of Food in the Last Year: Never true    Ran Out of Food in  the Last Year: Never true  Transportation Needs: No Transportation Needs (03/18/2023)   PRAPARE - Administrator, Civil Service (Medical): No    Lack of Transportation (Non-Medical): No  Physical Activity: Unknown (03/18/2023)   Exercise Vital Sign    Days of Exercise per Week: Patient declined    Minutes of Exercise per Session: Not on file  Stress: No Stress Concern Present (03/18/2023)   [employer] of Occupational Health - Occupational Stress Questionnaire    Feeling of Stress : Not at all  Social Connections: Moderately Integrated (  03/18/2023)   Social Connection and Isolation Panel [NHANES]    Frequency of Communication with Friends and Family: More than three times a week    Frequency of Social Gatherings with Friends and Family: Twice a week    Attends Religious Services: More than 4 times per year    Active Member of [employer] or Organizations: No    Attends Engineer, structural: Not on file    Marital Status: Married    Family History  Problem Relation Age of Onset   Colon cancer Maternal Grandfather 66   Colon polyps Mother 54   Colon cancer Maternal Uncle 40   Hypertension Father    Diabetes Father    Heart disease Father    Hyperlipidemia Father    Stroke Father

## 2023-09-07 ENCOUNTER — Ambulatory Visit: Payer: 59 | Admitting: "Endocrinology

## 2023-09-07 ENCOUNTER — Encounter: Payer: Self-pay | Admitting: "Endocrinology

## 2023-09-07 VITALS — BP 134/66 | HR 92 | Ht 62.0 in | Wt 97.2 lb

## 2023-09-07 DIAGNOSIS — M818 Other osteoporosis without current pathological fracture: Secondary | ICD-10-CM | POA: Diagnosis not present

## 2023-09-07 NOTE — Progress Notes (Signed)
09/07/2023        Endocrinology follow-up note  Past Medical History:  Diagnosis Date   Anxiety    COPD (chronic obstructive pulmonary disease) (HCC)    Dysrhythmia    GERD (gastroesophageal reflux disease)    Hypercholesterolemia    Pre-diabetes    Reactive airway disease    Tachycardia    Past Surgical History:  Procedure Laterality Date   CATARACT EXTRACTION W/PHACO Left 01/17/2017   Procedure: CATARACT EXTRACTION PHACO AND INTRAOCULAR LENS PLACEMENT (IOC);  Surgeon: Gemma Payor, MD;  Location: [facility];  Service: Ophthalmology;  Laterality: Left;  CDE: 6.92   CATARACT EXTRACTION W/PHACO Right 03/07/2020   Procedure: CATARACT EXTRACTION PHACO AND INTRAOCULAR LENS PLACEMENT RIGHT EYE;  Surgeon: Fabio Pierce, MD;  Location: [facility];  Service: Ophthalmology;  Laterality: Right;  CDE: 8.44   COLONOSCOPY  2013   RMR: Normal rectum. single diminutive sigmoid polyp noted above. Normal terminal ileum. Status post segmental biopsy. next TCS in 10 years.    COLONOSCOPY WITH PROPOFOL N/A 01/27/2022   Procedure: COLONOSCOPY WITH PROPOFOL;  Surgeon: Corbin Ade, MD;  [facility]: [facility];  Service: Endoscopy;  Laterality: N/A;  12:45pm   ELBOW SURGERY Right 2023   ESOPHAGOGASTRODUODENOSCOPY  2013   RMR: Possible cervical esophageal web-status post dilation as described above. Small hiatal hernia. Status post biopsy of normal appearing small bowel to screen for celiac disease.    ESOPHAGOGASTRODUODENOSCOPY N/A 05/06/2016   normal esophagus, small hiatal hernia, empiric dilation.   ESOPHAGOGASTRODUODENOSCOPY (EGD) WITH PROPOFOL N/A 01/27/2022   Procedure: ESOPHAGOGASTRODUODENOSCOPY (EGD) WITH PROPOFOL;  Surgeon: Corbin Ade, MD;  [facility];  Service: Endoscopy;  Laterality: N/A;   EYE SURGERY Bilateral    Cat Sx - Symfony MF IOLs   MALONEY DILATION N/A 05/06/2016   Procedure: MALONEY DILATION;  Surgeon:  Corbin Ade, MD;  [facility]: [facility];  Service: Endoscopy;  Laterality: N/A;   MALONEY DILATION N/A 01/27/2022   Procedure: Elease Hashimoto DILATION;  Surgeon: Corbin Ade, MD;  [facility]: [facility];  Service: Endoscopy;  Laterality: N/A;   NECK SURGERY  10/2012   OVARIAN CYST SURGERY  1997   removed   POLYPECTOMY  01/27/2022   Procedure: POLYPECTOMY;  Surgeon: Corbin Ade, MD;  [facility]: [facility];  Service: Endoscopy;;  cecal   ROBOTIC ASSISTED LAPAROSCOPIC HYSTERECTOMY AND SALPINGECTOMY Bilateral 12/14/2022   Procedure: XI ROBOTIC ASSISTED LAPAROSCOPIC HYSTERECTOMY AND SALPINGO-OOPHORECTOMY;  Surgeon: Lazaro Arms, MD;  Location: [facility];  Service: Gynecology;  Laterality: Bilateral;   TUBAL LIGATION  2000   VAGINAL PROLAPSE REPAIR N/A 12/14/2022   Procedure: VAGINAL VAULT SUSPENSION;  Surgeon: Lazaro Arms, MD;  Location: [facility];  Service: Gynecology;  Laterality: N/A;   YAG LASER APPLICATION Bilateral    Social History   Socioeconomic History   Marital status: Married    Spouse name: Not on file   Number of children: 2   Years of education: GED   Highest education level: 11th grade  Occupational History   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    Employer: [employer]  Tobacco Use   Smoking status: Every  Day    Current packs/day: 1.00    Average packs/day: 1 pack/day for 25.0 years (25.0 ttl pk-yrs)    Types: Cigarettes   Smokeless tobacco: Never  Vaping Use   Vaping status: Never Used  Substance and Sexual Activity   Alcohol use: No    Alcohol/week: 0.0 standard drinks of alcohol   Drug use: No   Sexual activity: Yes    Birth control/protection: Post-menopausal, Surgical    Comment: hysterstectomy  Other Topics Concern   Not on file  Social History Narrative   Lives at home w/ her husband   2 children - ages 47 & 33   Right-sided   Caffeine: 2 beverages per day   Social Drivers of Corporate investment banker Strain: Low Risk  (06/08/2022)   Overall  Financial Resource Strain (CARDIA)    Difficulty of Paying Living Expenses: Not hard at all  Food Insecurity: No Food Insecurity (03/18/2023)   Hunger Vital Sign    Worried About Running Out of Food in the Last Year: Never true    Ran Out of Food in the Last Year: Never true  Transportation Needs: No Transportation Needs (03/18/2023)   PRAPARE - Administrator, Civil Service (Medical): No    Lack of Transportation (Non-Medical): No  Physical Activity: Unknown (03/18/2023)   Exercise Vital Sign    Days of Exercise per Week: Patient declined    Minutes of Exercise per Session: Not on file  Stress: No Stress Concern Present (03/18/2023)   [employer] of Occupational Health - Occupational Stress Questionnaire    Feeling of Stress : Not at all  Social Connections: Moderately Integrated (03/18/2023)   Social Connection and Isolation Panel [NHANES]    Frequency of Communication with Friends and Family: More than three times a week    Frequency of Social Gatherings with Friends and Family: Twice a week    Attends Religious Services: More than 4 times per year    Active Member of [employer] or Organizations: No    Attends Banker Meetings: Not on file    Marital Status: Married   Outpatient Encounter Medications as of 09/07/2023  Medication Sig   ALPRAZolam (XANAX) 0.25 MG tablet TAKE 1/2 TO 1 TABLET BY MOUTH TWICE DAILY AS NEEDED FOR ANXIETY.   EPINEPHRINE 0.3 mg/0.3 mL IJ SOAJ injection INJECT INTO OUTER THIGH AND HOLD AGAINST LEG FOR 10 SECONDS FOR SEVERE ALLERGIC REACTION.   HYDROcodone-acetaminophen (NORCO) 10-325 MG tablet 1 twice daily as needed pain   HYDROcodone-acetaminophen (NORCO) 10-325 MG tablet 1 twice daily as needed pain   aspirin EC 81 MG tablet Take 81 mg by mouth daily. Swallow whole.   Bromfenac Sodium (PROLENSA) 0.07 % SOLN Place 1 drop into the right eye daily.   cycloSPORINE (RESTASIS) 0.05 % ophthalmic emulsion Place 1 drop into both eyes 2 (two) times  daily.   denosumab (PROLIA) 60 MG/ML SOSY injection Inject 60 mg into the skin every 6 (six) months.   estradiol (ESTRACE) 0.1 MG/GM vaginal cream Place 0.5 Applicatorfuls vaginally every other day.   HYDROcodone-acetaminophen (NORCO) 10-325 MG tablet 1 bid prn pain   ibuprofen (ADVIL) 600 MG tablet Take 1 tablet (600 mg total) by mouth every 8 (eight) hours as needed.   omeprazole (PRILOSEC) 40 MG capsule Take 1 capsule (40 mg total) by mouth daily as needed.   ondansetron (ZOFRAN-ODT) 8 MG disintegrating tablet Take 8 mg by mouth every 8 (eight) hours as needed for nausea  or vomiting.   rosuvastatin (CRESTOR) 10 MG tablet Take 1 tablet (10 mg total) by mouth daily. (Patient taking differently: Take 10 mg by mouth 3 (three) times a week.)   Triamcinolone Acetonide (NASACORT ALLERGY 24HR NA) Place 2 sprays into the nose daily.   Vitamin D, Ergocalciferol, (DRISDOL) 1.25 MG (50000 UNIT) CAPS capsule Take 1 capsule (50,000 Units total) by mouth every 7 (seven) days.   [DISCONTINUED] CRANBERRY PO Take 1 tablet by mouth daily. Gummy (Patient not taking: Reported on 09/01/2023)   [DISCONTINUED] Misc Natural Products (ELDERBERRY IMMUNE COMPLEX PO) Take 1 tablet by mouth daily. Gummy (Patient not taking: Reported on 09/01/2023)   [DISCONTINUED] Multiple Vitamins-Minerals (MULTIVITAMIN WITH MINERALS) tablet Take 1 tablet by mouth daily.  (Patient not taking: Reported on 09/01/2023)   No facility-administered encounter medications on file as of 09/07/2023.   ALLERGIES: Allergies  Allergen Reactions   Ciprofloxacin Other (See Comments)    Patient states with in five minutes of taking developed a splitting headache,itching all over and burning in mouth and lips.   Augmentin [Amoxicillin-Pot Clavulanate] Diarrhea   Carafate [Sucralfate]     Mouth tingling   Celexa [Citalopram Hydrobromide] Nausea And Vomiting   Chantix [Varenicline] Nausea And Vomiting   Sulfa Antibiotics Itching   Sulfur Itching    Wellbutrin [Bupropion]     Severe weight loss and loss of appetite   Fosamax [Alendronate Sodium] Nausea And Vomiting    GERD can not tolerate   Gabapentin     Sleepy and did not help    VACCINATION STATUS: Immunization History  Administered Date(s) Administered   Influenza, Seasonal, Injecte, Preservative Fre 06/06/2023   Influenza,inj,Quad PF,6+ Mos 09/14/2017, 07/04/2018, 04/25/2019, 07/14/2020, 07/16/2022   PFIZER(Purple Top)SARS-COV-2 Vaccination 10/14/2019, 11/04/2019   PNEUMOCOCCAL CONJUGATE-20 03/22/2023   Pneumococcal Polysaccharide-23 04/25/2019   Tdap 01/19/2018   Zoster Recombinant(Shingrix) 03/04/2022   Zoster, Unspecified 08/10/2022    HPI   Isabel Atkinson is 58 y.o. female who presents today with a medical history as above. she is being seen in follow-up after she was seen in consultation for osteoporosis requested by Babs Sciara, MD. She is status post 4 treatments of Prolia subcutaneous injection. Her last injection was on June 14, 2023.   Her August 08, 2023, she underwent a dental implant extraction.  This procedure went very well.  Procedure note is not received for review. There was no documented history of medication related osteonecrosis of the jaw. She is concerned about the rare complications of osteoporosis medications including osteonecrosis of the jaw including Prolia.    She has no interval falls nor fractures.   Her previsit DEXA scan shows improvement in her osteoporosis. Patient was diagnosed with osteoporosis  approximately  9 years ago. No dizziness/vertigo/orthostasis. She was treated with Fosamax which she did not tolerate.  She was also treated with what appears to be Reclast infusion also did not tolerate prior to 5 years ago. She has history of vitamin D deficiency currently on vitamin D and calcium supplements.  She also eats dairy and green, leafy, vegetables.   No weight bearing exercises.  She denies loss of height. She has  prior exposure to on and off steroids due to COPD/reactive airway disease, however denies chronic exposure to heavy dose steroids. She denies any prior history of parathyroid, thyroid dysfunctions.  No history of nephrolithiasis.  Her most recent calcium level is 9.6.  No h/o CKD. Last BUN/Cr: 15/0.56  Menopause was at 58 y/o.   Pt does  not have a FH of osteoporosis.  She has dental implants, however no scheduled dental procedure in the next near future.   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  She remains an active smoker.  She also has history of irritable bowel syndrome, prediabetes. She has received a steroid injection for hamstring tendinitis today.  Review of Systems  Constitutional: + No recent major weight change, she has always been light build,  + fatigue, no subjective hyperthermia, no subjective hypothermia   Objective:    BP 134/66   Pulse 92   Ht 5\' 2"  (1.575 m)   Wt 97 lb 3.2 oz (44.1 kg)   BMI 17.78 kg/m   Wt Readings from Last 3 Encounters:  09/07/23 97 lb 3.2 oz (44.1 kg)  09/01/23 97 lb (44 kg)  08/09/23 95 lb 3.2 oz (43.2 kg)    Physical Exam  Constitutional: + BMI of 17.78, not in acute distress, normal state of mind Eyes: PERRLA, EOMI, no exophthalmos ENT: moist mucous membranes, no thyromegaly, no cervical lymphadenopathy   CMP ( most recent) CMP     Component Value Date/Time   NA 144 05/25/2023 0908   K 4.0 08/12/2023 0910   CL 106 05/25/2023 0908   CO2 24 05/25/2023 0908   GLUCOSE 87 05/25/2023 0908   GLUCOSE 111 (H) 12/10/2022 1519   BUN 13 05/25/2023 0908   CREATININE 0.60 05/25/2023 0908   CREATININE 0.55 11/03/2013 0951   CALCIUM 8.7 05/25/2023 0908   PROT 6.5 05/25/2023 0908   PROT 7.0 12/09/2011 0900   ALBUMIN 4.5 05/25/2023 0908   AST 19 05/25/2023 0908   AST 18 12/09/2011 0900   ALT 19 05/25/2023 0908   ALKPHOS 69 05/25/2023 0908   ALKPHOS 76 12/09/2011 0900    BILITOT 0.5 05/25/2023 0908   BILITOT 0.3 12/09/2011 0900   GFRNONAA >60 12/10/2022 1519   GFRAA 121 01/11/2020 0811    Lipid Panel     Component Value Date/Time   CHOL 182 08/12/2023 0910   TRIG 97 08/12/2023 0910   TRIG 68 02/02/2023 0838   HDL 67 08/12/2023 0910   HDL 65 02/02/2023 0838   CHOLHDL 2.7 08/12/2023 0910   CHOLHDL 2.5 11/03/2013 0951   VLDL 13 11/03/2013 0951   LDLCALC 98 08/12/2023 0910   LABVLDL 17 08/12/2023 0910      Lab Results  Component Value Date   TSH 1.310 10/23/2020   TSH 1.430 01/11/2020   TSH 1.110 12/01/2017   TSH 1.030 01/14/2016   TSH 1.030 09/10/2015   TSH 0.962 04/25/2015   TSH 0.924 11/03/2013   FREET4 1.32 10/23/2020   FREET4 1.45 01/14/2016    Her previsit bone density on October 13, 2022 [facility] Spine L1-L4 (L2) 10/13/2022 58.4 Osteopenia -2.4 0.883 g/cm2 6.5% Yes AP Spine L1-L4 (L2) 10/08/2020 56.4 Osteoporosis -2.8 0.829 g/cm2 -9.5% Yes AP Spine L1-L4 (L2) 06/26/2015 51.1 Osteopenia -2.1 0.916 g/cm2 4.2% Yes AP Spine L1-L4 (L2) 05/18/2013 49.0 Osteopenia -2.4 0.879 g/cm2 - -   DualFemur Neck Right 10/13/2022 58.4 Osteopenia -2.4 0.708 g/cm2 7.4% Yes DualFemur Neck Right 10/08/2020 56.4 Osteoporosis -2.7 0.659 g/cm2 -7.7% Yes DualFemur Neck Right 06/26/2015 51.1 Osteopenia -2.3 0.714 g/cm2 4.7% - DualFemur Neck Right 05/18/2013 49.0 Osteoporosis -2.6 0.682 g/cm2 - -   DualFemur Total Mean 10/13/2022 58.4 Osteopenia -1.9 0.765 g/cm2 5.4% Yes DualFemur Total Mean 10/08/2020 56.4 Osteopenia -2.2 0.726 g/cm2 -9.5% Yes DualFemur Total Mean 06/26/2015 51.1  Osteopenia -1.6 0.802 g/cm2 3.9% Yes DualFemur Total Mean 05/18/2013 49.0 Osteopenia -1.9 0.772 g/cm2 - -   ASSESSMENT: The BMD measured at Femur Neck Right is 0.708 g/cm2 with a T-score of -2.4. This patient is considered osteopenic according to World Health [facility]) criteria. The scan quality is good. Compared with the prior study on 10/08/20 , the BMD of the total  mean and ap spine show a statistically significant increase. L2 was excluded due to advanced degenerative changes. Patient is not a candidate for FRAX assessment due to being on Prolia.    Assessment: 1. Osteoporosis 2.  Bisphosphonates intolerance, GERD  Plan: 1. Osteoporosis -Likely multifactorial including postmenopausal, steroids, smoking.   -I discussed her available bone density studies including her most recent one on October 13, 2022.  She is benefiting from Prolia treatment.  There is statistically significant gain in her BMD on her latest DEXA scan.  She has received her last Prolia injection in November 2024, due for her next injection in May 2025.    She  has tolerated her previous Prolia injections.      However, in light of her recent dental procedure (which appears to involve only extraction of the implant), she is concerned about potential complications of Prolia and her dental health.  I have requested the official procedure note from her oral surgeon. If there is no indication of osteonecrosis of the jaw, she will still benefit from continued Prolia treatment as scheduled.  I have strongly recommended that she does not avoid this important intervention without a clear contraindication.  She is made aware of the risk of Prolia withdrawal atypical fracture. If there was documented evidence of ONJ, Prolia and bisphosphonates will not be reconsidered.    -She has severe GERD, did not tolerate oral bisphosphonates.  She did not tolerate IV bisphosphonate either.    Unfortunately this will limit her options of treatment.  Patient is a heavy chronic smoker not a suitable candidate for raloxifene.  Her previsit CMP are favorable.      She has no evidence of primary hyperparathyroidism nor thyroid dysfunction.      - I explained the mechanism of action and expected benefits.   - we reviewed her dietary and supplemental calcium and vitamin D intake, which I advised her to increase  her vitamin D to 2000 units daily, calcium 750 mg twice a day.  We also discussed food sources of this elements and vitamin.  - discussed fall precautions   -She remains a chronic active smoker: The patient was counseled on the dangers of tobacco use, and was advised to quit.  Reviewed strategies to maximize success, including removing cigarettes and smoking materials from environment.  - I advised patient to maintain close follow up with Babs Sciara, MD for primary ae needs.   I spent  22  minutes in the care of the patient today including review of labs from Thyroid Function, CMP, and other relevant labs ; imaging/biopsy records (current and previous including abstractions from other facilities); face-to-face time discussing  her lab results and symptoms, medications doses, her options of short and long term treatment based on the latest standards of care / guidelines;   and documenting the encounter.  Beecher Mcardle  participated in the discussions, expressed understanding, and voiced agreement with the above plans.  All questions were answered to her satisfaction. she is encouraged to contact clinic should she have any questions or concerns prior to her return visit.  Follow up plan: Return Patient due for prolia in May, for Keep Reg. Appt. with Pre-visit Labs, Prolia During NV.   Marquis Lunch, MD [facility] [address] [place], [place] [address] Phone: [phone]  Fax: [phone]     09/07/2023, 4:34 PM  This note was partially dictated with voice recognition software. Similar sounding words can be transcribed inadequately or may not  be corrected upon review.

## 2023-09-15 ENCOUNTER — Telehealth: Payer: Self-pay

## 2023-09-15 NOTE — Telephone Encounter (Signed)
 Left a message for Texie File with [facility] requesting a return call to the office.

## 2023-09-29 ENCOUNTER — Other Ambulatory Visit: Payer: Self-pay | Admitting: Internal Medicine

## 2023-10-02 NOTE — Progress Notes (Unsigned)
 Cardiology Office Note   Date:  10/04/2023   ID:  Isabel Atkinson, DOB 11/17/1965, MRN [id number]  PCP:  Babs Sciara, MD  Cardiologist:   Dietrich Pates, MD    Patient presents for follow up of CAD    History of Present Illness: Isabel Atkinson is a 58 y.o. female with a history of CAD (on CT scan), PAD, COPD, tobacco use, GERD, palpitations, neuropathy    I saw her in clinic in Jan 2024  Since seen she denies CP  Breathing is OK   No dizziness  No palpitations Continues to smoke about 1/2 ppd Hx of osteopenia/osteoporosis.    Was on Prolia.   Complicated by dental problems, had to have rework of dental work    Pt says Current Meds  Medication Sig   ALPRAZolam (XANAX) 0.25 MG tablet TAKE 1/2 TO 1 TABLET BY MOUTH TWICE DAILY AS NEEDED FOR ANXIETY.   aspirin EC 81 MG tablet Take 81 mg by mouth daily. Swallow whole.   Bromfenac Sodium (PROLENSA) 0.07 % SOLN Place 1 drop into the right eye daily.   cycloSPORINE (RESTASIS) 0.05 % ophthalmic emulsion Place 1 drop into both eyes 2 (two) times daily.   denosumab (PROLIA) 60 MG/ML SOSY injection Inject 60 mg into the skin every 6 (six) months.   EPINEPHRINE 0.3 mg/0.3 mL IJ SOAJ injection INJECT INTO OUTER THIGH AND HOLD AGAINST LEG FOR 10 SECONDS FOR SEVERE ALLERGIC REACTION.   estradiol (ESTRACE) 0.1 MG/GM vaginal cream Place 0.5 Applicatorfuls vaginally every other day.   HYDROcodone-acetaminophen (NORCO) 10-325 MG tablet 1 bid prn pain   ibuprofen (ADVIL) 600 MG tablet Take 1 tablet (600 mg total) by mouth every 8 (eight) hours as needed.   omeprazole (PRILOSEC) 40 MG capsule Take 1 capsule (40 mg total) by mouth daily as needed.   ondansetron (ZOFRAN-ODT) 8 MG disintegrating tablet Take 8 mg by mouth every 8 (eight) hours as needed for nausea or vomiting.   rosuvastatin (CRESTOR) 10 MG tablet TAKE 1 TABLET BY MOUTH ONCE DAILY.   Triamcinolone Acetonide (NASACORT ALLERGY 24HR NA) Place 2 sprays into the nose daily.   Vitamin  D, Ergocalciferol, (DRISDOL) 1.25 MG (50000 UNIT) CAPS capsule Take 1 capsule (50,000 Units total) by mouth every 7 (seven) days.     Allergies:   Ciprofloxacin, Augmentin [amoxicillin-pot clavulanate], Carafate [sucralfate], Celexa [citalopram hydrobromide], Chantix [varenicline], Sulfa antibiotics, Sulfur, Wellbutrin [bupropion], Fosamax [alendronate sodium], and Gabapentin   Past Medical History:  Diagnosis Date   Anxiety    COPD (chronic obstructive pulmonary disease) (HCC)    Dysrhythmia    GERD (gastroesophageal reflux disease)    Hypercholesterolemia    Pre-diabetes    Reactive airway disease    Tachycardia     Past Surgical History:  Procedure Laterality Date   CATARACT EXTRACTION W/PHACO Left 01/17/2017   Procedure: CATARACT EXTRACTION PHACO AND INTRAOCULAR LENS PLACEMENT (IOC);  Surgeon: Gemma Payor, MD;  Location: [facility];  Service: Ophthalmology;  Laterality: Left;  CDE: 6.92   CATARACT EXTRACTION W/PHACO Right 03/07/2020   Procedure: CATARACT EXTRACTION PHACO AND INTRAOCULAR LENS PLACEMENT RIGHT EYE;  Surgeon: Fabio Pierce, MD;  Location: [facility];  Service: Ophthalmology;  Laterality: Right;  CDE: 8.44   COLONOSCOPY  2013   RMR: Normal rectum. single diminutive sigmoid polyp noted above. Normal terminal ileum. Status post segmental biopsy. next TCS in 10 years.    COLONOSCOPY WITH PROPOFOL N/A 01/27/2022   Procedure: COLONOSCOPY WITH PROPOFOL;  Surgeon: Corbin Ade, MD;  [facility];  Service: Endoscopy;  Laterality: N/A;  12:45pm   ELBOW SURGERY Right 2023   ESOPHAGOGASTRODUODENOSCOPY  2013   RMR: Possible cervical esophageal web-status post dilation as described above. Small hiatal hernia. Status post biopsy of normal appearing small bowel to screen for celiac disease.    ESOPHAGOGASTRODUODENOSCOPY N/A 05/06/2016   normal esophagus, small hiatal hernia, empiric dilation.   ESOPHAGOGASTRODUODENOSCOPY (EGD) WITH PROPOFOL N/A 01/27/2022   Procedure:  ESOPHAGOGASTRODUODENOSCOPY (EGD) WITH PROPOFOL;  Surgeon: Corbin Ade, MD;  [facility];  Service: Endoscopy;  Laterality: N/A;   EYE SURGERY Bilateral    Cat Sx - Symfony MF IOLs   MALONEY DILATION N/A 05/06/2016   Procedure: MALONEY DILATION;  Surgeon: Corbin Ade, MD;  [facility];  Service: Endoscopy;  Laterality: N/A;   MALONEY DILATION N/A 01/27/2022   Procedure: Elease Hashimoto DILATION;  Surgeon: Corbin Ade, MD;  [facility];  Service: Endoscopy;  Laterality: N/A;   NECK SURGERY  10/2012   OVARIAN CYST SURGERY  1997   removed   POLYPECTOMY  01/27/2022   Procedure: POLYPECTOMY;  Surgeon: Corbin Ade, MD;  [facility];  Service: Endoscopy;;  cecal   ROBOTIC ASSISTED LAPAROSCOPIC HYSTERECTOMY AND SALPINGECTOMY Bilateral 12/14/2022   Procedure: XI ROBOTIC ASSISTED LAPAROSCOPIC HYSTERECTOMY AND SALPINGO-OOPHORECTOMY;  Surgeon: Lazaro Arms, MD;  Location: [facility];  Service: Gynecology;  Laterality: Bilateral;   TUBAL LIGATION  2000   VAGINAL PROLAPSE REPAIR N/A 12/14/2022   Procedure: VAGINAL VAULT SUSPENSION;  Surgeon: Lazaro Arms, MD;  Location: [facility];  Service: Gynecology;  Laterality: N/A;   YAG LASER APPLICATION Bilateral      Social History:  The patient  reports that she has been smoking cigarettes. She has a 25 pack-year smoking history. She has never used smokeless tobacco. She reports that she does not drink alcohol and does not use drugs.   Family History:  The patient's family history includes Colon cancer (age of onset: 21) in her maternal uncle; Colon cancer (age of onset: 58) in her maternal grandfather; Colon polyps (age of onset: 59) in her mother; Diabetes in her father; Heart disease in her father; Hyperlipidemia in her father; Hypertension in her father; Stroke in her father.    ROS:  Please see the history of present illness. All other systems are reviewed and  Negative to the above problem except as noted.     PHYSICAL EXAM: VS:  BP 130/80   Pulse 80   Ht 5\' 2"  (1.575 m)   Wt 95 lb 3.2 oz (43.2 kg)   SpO2 100%   BMI 17.41 kg/m   GEN: Thin 58 yo  in no acute distress  HEENT: normal  Neck: no JVD, carotid bruit Cardiac: RRR; no murmur  No LE edema  Respiratory: Minimal rhonchi otherwise clear to auscultation GI: soft, nontender, no masses  No hepatomegaly  MS: no deformity Moving all extremities   Skin: warm and dry, no rash Neuro:  Strength and sensation are intact Psych: euthymic mood, full affect   EKG:  EKG is ordered today.  NSR 77 bpm    Lipid Panel    Component Value Date/Time   CHOL 182 08/12/2023 0910   TRIG 97 08/12/2023 0910   TRIG 68 02/02/2023 0838   HDL 67 08/12/2023 0910   HDL 65 02/02/2023 0838   CHOLHDL 2.7 08/12/2023 0910   CHOLHDL 2.5 11/03/2013 0951  VLDL 13 11/03/2013 0951   LDLCALC 98 08/12/2023 0910      Wt Readings from Last 3 Encounters:  10/04/23 95 lb 3.2 oz (43.2 kg)  09/07/23 97 lb 3.2 oz (44.1 kg)  09/01/23 97 lb (44 kg)      ASSESSMENT AND PLAN:  1  CAD  Pt remains asymptomatic   Follow   Rx risk factors  2  HL   Pt on Crestor 3x per week   Recent lipids LDL over 90    Last summer lipomed was excellent   Will repeat  Reviewed diet    3 Hx palpitations   PT without complaints  4  Tobacco abuse    Continues to smoke some  Again, counselled on cessation  5  Osteopenia/porosis.  Counselled on weight bearing activity   6  Diet  Cut back on SSBs.     7  Vit D  Will recheck levels     Current medicines are reviewed at length with the patient today.  The patient does not have concerns regarding medicines.  Signed, Dietrich Pates, MD  10/04/2023 2:35 PM    [facility] [address] [place], [place], [place]  [address] Phone: [phone]; Fax: [phone]   Pt presents for eval of CAD    History of Present Illness: Isabel Atkinson is a 58 y.o. female with a history of palpitations, COPD, GERD, and  neuropathy.  Event monitor was neg for arrhythmia, Myoview was normal.  The pt was last in cardiology clinc in Jan 2021   Since then she says her palpitations have gone away   She is off of her b blocker  In Aug 2022 she had a CT of chest for  lung cancer screening   Coronary calcifications noted as well as aorta calcifications  She is here today to discuss  Pt denies CP   She stays very busy   Works in [employer] care of grandkids   Says activty is good    No SOB   No palpitations   Takes Crestor 2x per week   Slows her down if she takes more often   She says she is still smoking about 1ppd   Trying to quit   Outpatient Medications Prior to Visit  Medication Sig Dispense Refill   ALPRAZolam (XANAX) 0.25 MG tablet TAKE 1/2 TO 1 TABLET BY MOUTH TWICE DAILY AS NEEDED FOR ANXIETY. 30 tablet 0   aspirin EC 81 MG tablet Take 81 mg by mouth daily. Swallow whole.     Bromfenac Sodium (PROLENSA) 0.07 % SOLN Place 1 drop into the right eye daily. 3 mL 10   cycloSPORINE (RESTASIS) 0.05 % ophthalmic emulsion Place 1 drop into both eyes 2 (two) times daily.     denosumab (PROLIA) 60 MG/ML SOSY injection Inject 60 mg into the skin every 6 (six) months. 1 mL 1   EPINEPHRINE 0.3 mg/0.3 mL IJ SOAJ injection INJECT INTO OUTER THIGH AND HOLD AGAINST LEG FOR 10 SECONDS FOR SEVERE ALLERGIC REACTION. 2 each 3   estradiol (ESTRACE) 0.1 MG/GM vaginal cream Place 0.5 Applicatorfuls vaginally every other day.     HYDROcodone-acetaminophen (NORCO) 10-325 MG tablet 1 bid prn pain 32 tablet 0   ibuprofen (ADVIL) 600 MG tablet Take 1 tablet (600 mg total) by mouth every 8 (eight) hours as needed. 3045 tablet 1   omeprazole (PRILOSEC) 40 MG capsule Take 1 capsule (40 mg total) by mouth daily as needed.  90 capsule 1   ondansetron (ZOFRAN-ODT) 8 MG disintegrating tablet Take 8 mg by mouth every 8 (eight) hours as needed for nausea or vomiting.     rosuvastatin (CRESTOR) 10 MG tablet TAKE 1 TABLET BY MOUTH ONCE DAILY. 30  tablet 0   Triamcinolone Acetonide (NASACORT ALLERGY 24HR NA) Place 2 sprays into the nose daily.     Vitamin D, Ergocalciferol, (DRISDOL) 1.25 MG (50000 UNIT) CAPS capsule Take 1 capsule (50,000 Units total) by mouth every 7 (seven) days. 5 capsule 11   HYDROcodone-acetaminophen (NORCO) 10-325 MG tablet 1 twice daily as needed pain 32 tablet 0   HYDROcodone-acetaminophen (NORCO) 10-325 MG tablet 1 twice daily as needed pain 32 tablet 0   No facility-administered medications prior to visit.     Allergies:   Ciprofloxacin, Augmentin [amoxicillin-pot clavulanate], Carafate [sucralfate], Celexa [citalopram hydrobromide], Chantix [varenicline], Sulfa antibiotics, Sulfur, Wellbutrin [bupropion], Fosamax [alendronate sodium], and Gabapentin   Past Medical History:  Diagnosis Date   Anxiety    COPD (chronic obstructive pulmonary disease) (HCC)    Dysrhythmia    GERD (gastroesophageal reflux disease)    Hypercholesterolemia    Pre-diabetes    Reactive airway disease    Tachycardia     Past Surgical History:  Procedure Laterality Date   CATARACT EXTRACTION W/PHACO Left 01/17/2017   Procedure: CATARACT EXTRACTION PHACO AND INTRAOCULAR LENS PLACEMENT (IOC);  Surgeon: Gemma Payor, MD;  Location: [facility];  Service: Ophthalmology;  Laterality: Left;  CDE: 6.92   CATARACT EXTRACTION W/PHACO Right 03/07/2020   Procedure: CATARACT EXTRACTION PHACO AND INTRAOCULAR LENS PLACEMENT RIGHT EYE;  Surgeon: Fabio Pierce, MD;  Location: [facility];  Service: Ophthalmology;  Laterality: Right;  CDE: 8.44   COLONOSCOPY  2013   RMR: Normal rectum. single diminutive sigmoid polyp noted above. Normal terminal ileum. Status post segmental biopsy. next TCS in 10 years.    COLONOSCOPY WITH PROPOFOL N/A 01/27/2022   Procedure: COLONOSCOPY WITH PROPOFOL;  Surgeon: Corbin Ade, MD;  [facility];  Service: Endoscopy;  Laterality: N/A;  12:45pm   ELBOW SURGERY Right 2023   ESOPHAGOGASTRODUODENOSCOPY  2013    RMR: Possible cervical esophageal web-status post dilation as described above. Small hiatal hernia. Status post biopsy of normal appearing small bowel to screen for celiac disease.    ESOPHAGOGASTRODUODENOSCOPY N/A 05/06/2016   normal esophagus, small hiatal hernia, empiric dilation.   ESOPHAGOGASTRODUODENOSCOPY (EGD) WITH PROPOFOL N/A 01/27/2022   Procedure: ESOPHAGOGASTRODUODENOSCOPY (EGD) WITH PROPOFOL;  Surgeon: Corbin Ade, MD;  [facility];  Service: Endoscopy;  Laterality: N/A;   EYE SURGERY Bilateral    Cat Sx - Symfony MF IOLs   MALONEY DILATION N/A 05/06/2016   Procedure: MALONEY DILATION;  Surgeon: Corbin Ade, MD;  [facility];  Service: Endoscopy;  Laterality: N/A;   MALONEY DILATION N/A 01/27/2022   Procedure: Elease Hashimoto DILATION;  Surgeon: Corbin Ade, MD;  [facility];  Service: Endoscopy;  Laterality: N/A;   NECK SURGERY  10/2012   OVARIAN CYST SURGERY  1997   removed   POLYPECTOMY  01/27/2022   Procedure: POLYPECTOMY;  Surgeon: Corbin Ade, MD;  [facility];  Service: Endoscopy;;  cecal   ROBOTIC ASSISTED LAPAROSCOPIC HYSTERECTOMY AND SALPINGECTOMY Bilateral 12/14/2022   Procedure: XI ROBOTIC ASSISTED LAPAROSCOPIC HYSTERECTOMY AND SALPINGO-OOPHORECTOMY;  Surgeon: Lazaro Arms, MD;  Location: [facility];  Service: Gynecology;  Laterality: Bilateral;   TUBAL LIGATION  2000   VAGINAL PROLAPSE REPAIR N/A 12/14/2022  Procedure: VAGINAL VAULT SUSPENSION;  Surgeon: Lazaro Arms, MD;  Location: [facility];  Service: Gynecology;  Laterality: N/A;   YAG LASER APPLICATION Bilateral      Social History:  The patient  reports that she has been smoking cigarettes. She has a 25 pack-year smoking history. She has never used smokeless tobacco. She reports that she does not drink alcohol and does not use drugs.   Family History:  The patient's family history includes Colon cancer (age of onset: 51) in her maternal uncle; Colon cancer (age  of onset: 4) in her maternal grandfather; Colon polyps (age of onset: 17) in her mother; Diabetes in her father; Heart disease in her father; Hyperlipidemia in her father; Hypertension in her father; Stroke in her father.    ROS:  Please see the history of present illness. All other systems are reviewed and  Negative to the above problem except as noted.    PHYSICAL EXAM: VS:  BP 130/80   Pulse 80   Ht 5\' 2"  (1.575 m)   Wt 95 lb 3.2 oz (43.2 kg)   SpO2 100%   BMI 17.41 kg/m   GEN: Thin 58 yo  in no acute distress  HEENT: normal  Neck: no JVD, no carotid bruits Cardiac: RRR; no murmurs,  No LE edema  Respiratory:  clear to auscultation bilaterally, normal work of breathing GI: soft, nontender, nondistended, + BS  No hepatomegaly  MS: no deformity Moving all extremities   Skin: warm and dry, no rash Neuro:  Strength and sensation are intact Psych: euthymic mood, full affect  EKG  SR 90  Nonspecific ST changes     Lipid Panel    Component Value Date/Time   CHOL 182 08/12/2023 0910   TRIG 97 08/12/2023 0910   TRIG 68 02/02/2023 0838   HDL 67 08/12/2023 0910   HDL 65 02/02/2023 0838   CHOLHDL 2.7 08/12/2023 0910   CHOLHDL 2.5 11/03/2013 0951   VLDL 13 11/03/2013 0951   LDLCALC 98 08/12/2023 0910      Wt Readings from Last 3 Encounters:  10/04/23 95 lb 3.2 oz (43.2 kg)  09/07/23 97 lb 3.2 oz (44.1 kg)  09/01/23 97 lb (44 kg)    EKG   Not done today   ASSESSMENT AND PLAN:  1  CAD  Pt with CAD on CT scan   Reviewed images with her    Nothing sounds flow limiting   She is active   Follow   Rx risk factors Lipids   BP    Take ecASA 81 mg   2  HTN  Pt says her BP is never this high   FOllow   Get cuff at home compared to other cuffs  Call if high    Plan to follow in summer   3  HL   Last lpids in 2021  LDL 93  Will get lipomed   Need tight control  4 Palpitations   Pt denies   Follow   5   Tob use    Counselled on cessaton     F/U will be July 2023  Current  medicines are reviewed at length with the patient today.  The patient does not have concerns regarding medicines.  Signed, Dietrich Pates, MD  10/04/2023 2:35 PM    [facility] [address] [place], [place], [place]  [address] Phone: [phone]; Fax: [phone]

## 2023-10-04 ENCOUNTER — Other Ambulatory Visit (HOSPITAL_COMMUNITY)
Admission: RE | Admit: 2023-10-04 | Discharge: 2023-10-04 | Disposition: A | Discharge status: Home / Self Care | Payer: 59 | Source: Ambulatory Visit | Attending: Internal Medicine | Admitting: Internal Medicine

## 2023-10-04 ENCOUNTER — Ambulatory Visit: Payer: 59 | Attending: Internal Medicine | Admitting: Internal Medicine

## 2023-10-04 ENCOUNTER — Encounter: Payer: Self-pay | Admitting: Internal Medicine

## 2023-10-04 VITALS — BP 130/80 | HR 80 | Ht 62.0 in | Wt 95.2 lb

## 2023-10-04 DIAGNOSIS — E785 Hyperlipidemia, unspecified: Secondary | ICD-10-CM | POA: Diagnosis not present

## 2023-10-04 DIAGNOSIS — Z79899 Other long term (current) drug therapy: Secondary | ICD-10-CM | POA: Diagnosis not present

## 2023-10-04 DIAGNOSIS — R002 Palpitations: Secondary | ICD-10-CM | POA: Diagnosis not present

## 2023-10-04 LAB — VITAMIN D 25 HYDROXY (VIT D DEFICIENCY, FRACTURES): Vit D, 25-Hydroxy: 47.07 ng/mL (ref 30–100)

## 2023-10-04 NOTE — Patient Instructions (Signed)
 Medication Instructions:  Your physician recommends that you continue on your current medications as directed. Please refer to the Current Medication list given to you today.  *If you need a refill on your cardiac medications before your next appointment, please call your pharmacy*   Lab Work: Your physician recommends that you return for lab work in: Today ( Vit D, NMR)   If you have labs (blood work) drawn today and your tests are completely normal, you will receive your results only by: MyChart Message (if you have MyChart) OR A paper copy in the mail If you have any lab test that is abnormal or we need to change your treatment, we will call you to review the results.   Testing/Procedures: NONE    Follow-Up: At [facility], you and your health needs are our priority.  As part of our continuing mission to provide you with exceptional heart care, we have created designated Provider Care Teams.  These Care Teams include your primary Cardiologist (physician) and Advanced Practice Providers (APPs -  Physician Assistants and Nurse Practitioners) who all work together to provide you with the care you need, when you need it.  We recommend signing up for the patient portal called "MyChart".  Sign up information is provided on this After Visit Summary.  MyChart is used to connect with patients for Virtual Visits (Telemedicine).  Patients are able to view lab/test results, encounter notes, upcoming appointments, etc.  Non-urgent messages can be sent to your provider as well.   To learn more about what you can do with MyChart, go to [email or web address].    Your next appointment:    January / February   Provider:   You may see Dietrich Pates, MD or one of the following Advanced Practice Providers on your designated Care Team:   Randall An, PA-C  Jacolyn Reedy, PA-C     Other Instructions Thank you for choosing [facility]!

## 2023-10-05 LAB — NMR, LIPOPROFILE
Cholesterol, Total: 135 mg/dL (ref 100–199)
HDL Cholesterol by NMR: 70 mg/dL (ref >39)
HDL Particle Number: 36.9 umol/L (ref >=30.5)
LDL Particle Number: 518 nmol/L (ref <1000)
LDL Size: 21.1 nm (ref >20.5)
LDL-C (NIH Calc): 51 mg/dL (ref 0–99)
LP-IR Score: 32 (ref <=45)
Small LDL Particle Number: <90 nmol/L (ref <=527)
Triglycerides by NMR: 66 mg/dL (ref 0–149)

## 2023-10-07 ENCOUNTER — Other Ambulatory Visit: Payer: Self-pay

## 2023-10-07 MED ORDER — VITAMIN D3 125 MCG (5000 UT) PO TABS: 1.0000 | ORAL_TABLET | Freq: Every day | ORAL | 11 refills | Status: AC

## 2023-10-19 ENCOUNTER — Ambulatory Visit: Admitting: Physician Assistant

## 2023-10-19 ENCOUNTER — Encounter: Payer: Self-pay | Admitting: Physician Assistant

## 2023-10-19 VITALS — BP 132/64 | HR 93 | Temp 96.4°F | Ht 62.0 in | Wt 95.8 lb

## 2023-10-19 DIAGNOSIS — J069 Acute upper respiratory infection, unspecified: Secondary | ICD-10-CM | POA: Diagnosis not present

## 2023-10-19 DIAGNOSIS — R3121 Asymptomatic microscopic hematuria: Secondary | ICD-10-CM | POA: Diagnosis not present

## 2023-10-19 DIAGNOSIS — B9689 Other specified bacterial agents as the cause of diseases classified elsewhere: Secondary | ICD-10-CM | POA: Diagnosis not present

## 2023-10-19 DIAGNOSIS — R300 Dysuria: Secondary | ICD-10-CM

## 2023-10-19 LAB — POCT URINALYSIS DIP (CLINITEK)
Bilirubin, UA: NEGATIVE
Glucose, UA: NEGATIVE mg/dL
Ketones, POC UA: NEGATIVE mg/dL
Leukocytes, UA: NEGATIVE
Nitrite, UA: NEGATIVE
POC PROTEIN,UA: NEGATIVE
Spec Grav, UA: 1.025 (ref 1.010–1.025)
Urobilinogen, UA: 0.2 U/dL
pH, UA: 6.5 (ref 5.0–8.0)

## 2023-10-19 MED ORDER — DOXYCYCLINE HYCLATE 100 MG PO TABS: 100.0000 mg | ORAL_TABLET | Freq: Two times a day (BID) | ORAL | 0 refills | Status: AC

## 2023-10-19 NOTE — Assessment & Plan Note (Signed)
 Patient appears stable today. Benign exam, lungs clear to auscultation bilaterally, no indication for chest XR at this time. Discussed that this fits the picture of viral vs bacterial URI and that due to type and duration of symptoms and history of COPD, we will treat as bacterial URI. No evidence of other bacterial infections including pneumonia, pharyngitis, sinusitis, or otitis media. Supportive care reviewed with patient. Tylenol or ibuprofen for pain as needed. May continue with OTC cold medications. Patient instructed to return to clinic if worsening shortness of breath, chest pain, hypoxia, or other concerns. Patient agreeable to plan.

## 2023-10-19 NOTE — Assessment & Plan Note (Signed)
 Patient with microscopic hematuria on dipstick today.  No suprapubic tenderness or CVA tenderness.  Minimal dysuria.  No pyuria or nitrites on dipstick today.  Will send urine for microscopy and culture and follow-up with patient based on results.  Patient has history of microscopic hematuria.  Patient understands and agrees to plan.

## 2023-10-19 NOTE — Progress Notes (Signed)
 Acute Office Visit  Subjective:     Patient ID: Isabel Atkinson, female    DOB: [birth year], 58 y.o.   MRN: [id number]   Patient is in today for concerns of cough, fatigue, wheezing, headaches.  She states symptoms started on Saturday.  She reports sick contacts after being around her grandkids.  She describes cough as productive.  She states no fevers, denies shortness with or chest pain.  She reports eating and drinking as normally.  She has been taking Mucinex and cough syrup over-the-counter with minimal help.  She also reports some discomfort with urination for approximately 1 week.  She denies suprapubic tenderness or flank pain.  She denies gross hematuria.  She has not taken Azo for symptoms.  She denies nausea, vomiting, and fevers.  Review of Systems  Constitutional:  Positive for diaphoresis and malaise/fatigue. Negative for fever.  HENT:  Positive for congestion and sinus pain. Negative for ear pain, nosebleeds and sore throat.   Respiratory:  Positive for cough, sputum production and wheezing. Negative for shortness of breath.   Cardiovascular:  Negative for chest pain and palpitations.  Gastrointestinal:  Negative for nausea and vomiting.  Genitourinary:  Positive for dysuria. Negative for frequency, hematuria and urgency.  Neurological:  Positive for headaches.        Objective:     BP 132/64   Pulse 93   Temp (!) 96.4 F (35.8 C)   Ht 5\' 2"  (1.575 m)   Wt 95 lb 12.8 oz (43.5 kg)   SpO2 100%   BMI 17.52 kg/m   Physical Exam Vitals reviewed.  Constitutional:      General: She is not in acute distress.    Appearance: Normal appearance.  HENT:     Nose: Congestion present.     Mouth/Throat:     Mouth: Mucous membranes are moist.     Pharynx: Oropharynx is clear.  Eyes:     Extraocular Movements: Extraocular movements intact.     Conjunctiva/sclera: Conjunctivae normal.  Cardiovascular:     Rate and Rhythm: Normal rate and regular rhythm.     Heart  sounds: Normal heart sounds. No murmur heard. Pulmonary:     Effort: Pulmonary effort is normal.     Breath sounds: Normal breath sounds. No stridor. No wheezing, rhonchi or rales.  Abdominal:     General: Abdomen is flat.     Palpations: Abdomen is soft.     Tenderness: There is no abdominal tenderness. There is no right CVA tenderness or left CVA tenderness.  Musculoskeletal:        General: Normal range of motion.  Skin:    General: Skin is warm and dry.     Capillary Refill: Capillary refill takes less than 2 seconds.  Neurological:     General: No focal deficit present.     Mental Status: She is alert and oriented to person, place, and time.  Psychiatric:        Mood and Affect: Mood normal.        Behavior: Behavior normal.     Results for orders placed or performed in visit on 10/19/23  POCT URINALYSIS DIP (CLINITEK)  Result Value Ref Range   Color, UA yellow yellow   Clarity, UA cloudy (A) clear   Glucose, UA negative negative mg/dL   Bilirubin, UA negative negative   Ketones, POC UA negative negative mg/dL   Spec Grav, UA 4.098 1.191 - 1.025   Blood, UA moderate (A) negative  pH, UA 6.5 5.0 - 8.0   POC PROTEIN,UA negative negative, trace   Urobilinogen, UA 0.2 0.2 or 1.0 E.U./dL   Nitrite, UA Negative Negative   Leukocytes, UA Negative Negative        Assessment & Plan:  Bacterial URI Assessment & Plan: Patient appears stable today. Benign exam, lungs clear to auscultation bilaterally, no indication for chest XR at this time. Discussed that this fits the picture of viral vs bacterial URI and that due to type and duration of symptoms and history of COPD, we will treat as bacterial URI. No evidence of other bacterial infections including pneumonia, pharyngitis, sinusitis, or otitis media. Supportive care reviewed with patient. Tylenol or ibuprofen for pain as needed. May continue with OTC cold medications. Patient instructed to return to clinic if worsening  shortness of breath, chest pain, hypoxia, or other concerns. Patient agreeable to plan.   Orders: -     Doxycycline Hyclate; Take 1 tablet (100 mg total) by mouth 2 (two) times daily for 7 days.  Dispense: 14 tablet; Refill: 0  Dysuria -     POCT URINALYSIS DIP (CLINITEK) -     Urine Culture  Asymptomatic microscopic hematuria Assessment & Plan: Patient with microscopic hematuria on dipstick today.  No suprapubic tenderness or CVA tenderness.  Minimal dysuria.  No pyuria or nitrites on dipstick today.  Will send urine for microscopy and culture and follow-up with patient based on results.  Patient has history of microscopic hematuria.  Patient understands and agrees to plan.  Orders: -     Urinalysis, Routine w reflex microscopic    Return if symptoms worsen or fail to improve.  Toni Amend Alexis Reber, PA-C

## 2023-10-20 ENCOUNTER — Encounter: Payer: Self-pay | Admitting: Physician Assistant

## 2023-10-20 ENCOUNTER — Encounter: Payer: Self-pay | Admitting: Internal Medicine

## 2023-10-20 LAB — MICROSCOPIC EXAMINATION
Casts: NONE SEEN /LPF
Epithelial Cells (non renal): >10 /HPF — AB (ref 0–10)
WBC, UA: NONE SEEN /HPF (ref 0–5)

## 2023-10-20 LAB — URINALYSIS, ROUTINE W REFLEX MICROSCOPIC
Bilirubin, UA: NEGATIVE
Glucose, UA: NEGATIVE
Ketones, UA: NEGATIVE
Leukocytes,UA: NEGATIVE
Nitrite, UA: NEGATIVE
Protein,UA: NEGATIVE
Specific Gravity, UA: 1.021 (ref 1.005–1.030)
Urobilinogen, Ur: 0.2 mg/dL (ref 0.2–1.0)
pH, UA: 5.5 (ref 5.0–7.5)

## 2023-10-20 LAB — SPECIMEN STATUS REPORT

## 2023-11-07 ENCOUNTER — Ambulatory Visit: Admitting: Nurse Practitioner

## 2023-11-07 ENCOUNTER — Ambulatory Visit: Payer: Self-pay

## 2023-11-07 ENCOUNTER — Encounter: Payer: Self-pay | Admitting: Nurse Practitioner

## 2023-11-07 VITALS — BP 112/76 | Temp 98.8°F | Ht 62.0 in | Wt 93.0 lb

## 2023-11-07 DIAGNOSIS — R509 Fever, unspecified: Secondary | ICD-10-CM

## 2023-11-07 DIAGNOSIS — B349 Viral infection, unspecified: Secondary | ICD-10-CM | POA: Diagnosis not present

## 2023-11-07 MED ORDER — PREDNISONE 20 MG PO TABS: ORAL_TABLET | ORAL | 0 refills | Status: DC

## 2023-11-07 NOTE — Progress Notes (Signed)
   Subjective:    Patient ID: Isabel Atkinson, female    DOB: 05/30/1966, 58 y.o.   MRN: [id number]  HPI Presents for complaints of fever, fatigue, body aches, cough and chest congestion, nasal congestion that began 3 days ago.  Began running a temp of 100.3.  Minimal wheezing mainly in the upper anterior area of the chest.  No shortness of breath.  Frequent cough.  Occasionally productive.  Sore throat.  Bilateral ear pain.  No nausea vomiting or diarrhea.  Taking fluids well.  Voiding normal limit.  Has taken Tylenol and Mucinex for her symptoms. Social History   Tobacco Use   Smoking status: Every Day    Current packs/day: 1.00    Average packs/day: 1 pack/day for 25.0 years (25.0 ttl pk-yrs)    Types: Cigarettes   Smokeless tobacco: Never  Vaping Use   Vaping status: Never Used  Substance Use Topics   Alcohol use: No    Alcohol/week: 0.0 standard drinks of alcohol   Drug use: No        Objective:   Physical Exam NAD.  Alert, oriented.  TMs minimal clear effusion, no erythema.  Pharynx clear and moist.  Neck supple with mild soft anterior cervical adenopathy.  Lungs clear.  No tachypnea.  Normal color.  Heart regular rate rhythm. Today's Vitals   11/07/23 1523  BP: 112/76  Temp: 98.8 F (37.1 C)  SpO2: 98%  Weight: 93 lb (42.2 kg)  Height: 5\' 2"  (1.575 m)   Body mass index is 17.01 kg/m.        Assessment & Plan:  Viral illness - Plan: COVID-19, Flu A+B and RSV  Febrile illness - Plan: COVID-19, Flu A+B and RSV Meds ordered this encounter  Medications   predniSONE (DELTASONE) 20 MG tablet    Sig: Take 2 tabs po once daily x 5 days    Dispense:  10 tablet    Refill:  0    Supervising Provider:   Lilyan Punt A [[id number]]   Short prednisone taper prescribed.  OTC cough medicine as directed.  Defers prescription cough medicine at this time.  Patient tested for COVID flu and RSV, test pending.  Warning signs reviewed.  Call back by the end of the week if no  improvement, sooner if worse.

## 2023-11-10 ENCOUNTER — Ambulatory Visit: Payer: Self-pay | Admitting: Family Medicine

## 2023-11-10 ENCOUNTER — Encounter: Payer: Self-pay | Admitting: Nurse Practitioner

## 2023-11-10 ENCOUNTER — Encounter: Payer: Self-pay | Admitting: Family Medicine

## 2023-11-10 VITALS — BP 138/86 | HR 74 | Temp 98.4°F | Ht 62.0 in | Wt 95.0 lb

## 2023-11-10 DIAGNOSIS — E782 Mixed hyperlipidemia: Secondary | ICD-10-CM

## 2023-11-10 DIAGNOSIS — M25559 Pain in unspecified hip: Secondary | ICD-10-CM | POA: Diagnosis not present

## 2023-11-10 DIAGNOSIS — Z720 Tobacco use: Secondary | ICD-10-CM | POA: Diagnosis not present

## 2023-11-10 DIAGNOSIS — Z79891 Long term (current) use of opiate analgesic: Secondary | ICD-10-CM | POA: Diagnosis not present

## 2023-11-10 LAB — COVID-19, FLU A+B AND RSV
Influenza A, NAA: NOT DETECTED
Influenza B, NAA: NOT DETECTED
RSV, NAA: NOT DETECTED
SARS-CoV-2, NAA: DETECTED — AB

## 2023-11-10 LAB — SPECIMEN STATUS REPORT

## 2023-11-10 MED ORDER — HYDROCODONE-ACETAMINOPHEN 10-325 MG PO TABS: ORAL_TABLET | ORAL | 0 refills | Status: DC

## 2023-11-10 MED ORDER — ROSUVASTATIN CALCIUM 10 MG PO TABS: 10.0000 mg | ORAL_TABLET | Freq: Every day | ORAL | 3 refills | Status: AC

## 2023-11-10 NOTE — Progress Notes (Signed)
 Subjective:    Patient ID: Isabel Atkinson, female    DOB: November 25, 1965, 58 y.o.   MRN: [id number]  HPI Pain management follow up This patient was seen today for chronic pain  The medication list was reviewed and updated.   Location of Pain for which the patient has been treated with regarding narcotics: Left hip  Onset of this pain: Present for the past 18 to 24 months   -Compliance with medication: Good compliance  - Number patient states they take daily: 1 or less daily  -Reason for ongoing use of opioids takes Tylenol and NSAIDs but that does not do enough  What other measures have been tried outside of opioids Tylenol, NSAIDs, injections  In the ongoing specialists regarding this condition orthopedic  -when was the last dose patient took?  Yesterday  The patient was advised the importance of maintaining medication and not using illegal substances with these.  Here for refills and follow up  The patient was educated that we can provide 3 monthly scripts for their medication, it is their responsibility to follow the instructions.  Side effects or complications from medications: Denies side effects  Patient is aware that pain medications are meant to minimize the severity of the pain to allow their pain levels to improve to allow for better function. They are aware of that pain medications cannot totally remove their pain.  Due for UDT ( at least once per year) (pain management contract is also completed at the time of the UDT): Most recent 1 fall 2024  Scale of 1 to 10 ( 1 is least 10 is most) Your pain level without the medicine: 8-9 Your pain level with medication 4-5  Scale 1 to 10 ( 1-helps very little, 10 helps very well) How well does your pain medication reduce your pain so you can function better through out the day?  8  Quality of the pain: Aching  Persistence of the pain: Present all the time  Modifying factors: Worse with activity  Discussed the use  of AI scribe software for clinical note transcription with the patient, who gave verbal consent to proceed.  History of Present Illness   Isabel Atkinson is a 58 year old female who presents for follow-up on hip pain treatment efficacy.  She has been experiencing persistent hip pain since receiving another injection last week. The pain tends to exacerbate when she becomes ill. Previous discussions with her orthopedist considered surgery, but the current approach focuses on injections targeting the hamstring tendon. She has received two injections so far, with a potential third involving plasma treatment if necessary. She plans to delay further treatment until June due to post-injection lifting restrictions.  She manages her hip pain with 600 mg of ibuprofen and hydrocodone, which she takes after returning home with her grandchildren. She uses approximately 32 hydrocodone tablets monthly, noting a decrease in usage compared to the previous month. She is cautious with medication storage due to her grandchildren's presence.  She smokes about a pack of cigarettes daily and acknowledges the need to reduce her smoking. She experienced a recent illness with fever starting last Saturday but has been fever-free for over 24 hours. She reports a residual cough but denies any wheezing or pneumonia symptoms.  She is currently taking Crestor for cholesterol management, with recent tests showing improved LDL levels compared to previous results.  She takes vitamin D3 and has concerns about her dental implant stability, which is influencing her consideration of discontinuing  Prolia shots.          Review of Systems     Objective:   Physical Exam General-in no acute distress Eyes-no discharge Lungs-respiratory rate normal, CTA CV-no murmurs,RRR Extremities skin warm dry no edema Neuro grossly normal Behavior normal, alert        Assessment & Plan:  Assessment and Plan    Hamstring  Tendinopathy Chronic hamstring tendon pain with limited relief from steroid injections. PRP injection suggested by orthopedist for healing stimulation. Treatment delayed until June due to post-injection lifting restrictions. PRP not covered by insurance, costs $600. - Continue ibuprofen 600 mg as needed and hydrocodone as prescribed. - Consider PRP injection in June after orthopedist consultation.  Acute Upper Respiratory Infection Symptoms resolved, afebrile for over 24 hours, cleared for work with mask. - Advise mask use at work for five days per guidelines.  Osteoporosis Considering Prolia discontinuation due to dental implant concerns. Vitamin D3 and weight-bearing exercises recommended. Drug holiday considered for dental healing. - Discuss Prolia drug holiday with Dr. Janett Billow. - Encourage weight-bearing exercises. - Continue vitamin D3 supplementation.  Hyperlipidemia LDL improved with Crestor, favorable cholesterol results in February, adherent to medication. - Continue Crestor as prescribed. - Send 90-day prescription refill to [facility].  Nicotine Dependence Continues smoking one pack per day, difficulty reducing, acknowledges need to cut back. - Encourage reduction to half a pack per day. - Discuss smoking cessation strategies and support options.      1. Encounter for long-term opiate analgesic use (Primary) The patient was seen in followup for chronic pain. A review over at their current pain status was discussed. Drug registry was checked. Prescriptions were given.  Regular follow-up recommended. Discussion was held regarding the importance of compliance with medication as well as pain medication contract.  Patient was informed that medication may cause drowsiness and should not be combined  with other medications/alcohol or street drugs. If the patient feels medication is causing altered alertness then do not drive or operate dangerous equipment.  Should be noted  that the patient appears to be meeting appropriate use of opioids and response.  Evidenced by improved function and decent pain control without significant side effects and no evidence of overt aberrancy issues.  Upon discussion with the patient today they understand that opioid therapy is optional and they feel that the pain has been refractory to reasonable conservative measures and is significant and affecting quality of life enough to warrant ongoing therapy and wishes to continue opioids.  Refills were provided.  [facility] medical Board guidelines regarding the pain medicine has been reviewed.  CDC guidelines most updated 2022 has been reviewed by the prescriber.  PDMP is checked on a regular basis yearly urine drug screen and pain management contract  Treatment plan for this patient includes #1-gentle stretching exercises as shown daily basis 2.  Mild strength exercises 3 times per week #3 continue pain medications #4 notify us if any digression   2. Hip pain, unspecified laterality Continue care with orthopedics  3. Tobacco use Patient counseled to try to quit  4. Mixed hyperlipidemia Continue current measures recent labs look very good

## 2023-11-14 ENCOUNTER — Other Ambulatory Visit: Payer: Self-pay | Admitting: "Endocrinology

## 2023-11-14 DIAGNOSIS — M818 Other osteoporosis without current pathological fracture: Secondary | ICD-10-CM

## 2023-11-15 ENCOUNTER — Encounter: Payer: Self-pay | Admitting: Family Medicine

## 2023-11-15 ENCOUNTER — Ambulatory Visit: Payer: Self-pay

## 2023-11-15 ENCOUNTER — Ambulatory Visit: Admitting: Family Medicine

## 2023-11-15 ENCOUNTER — Ambulatory Visit (HOSPITAL_COMMUNITY)
Admission: RE | Admit: 2023-11-15 | Discharge: 2023-11-15 | Disposition: A | Discharge status: Home / Self Care | Source: Ambulatory Visit | Attending: Family Medicine | Admitting: Family Medicine

## 2023-11-15 VITALS — BP 122/78 | HR 90 | Temp 98.1°F | Ht 62.0 in | Wt 94.0 lb

## 2023-11-15 DIAGNOSIS — R5383 Other fatigue: Secondary | ICD-10-CM

## 2023-11-15 DIAGNOSIS — B9689 Other specified bacterial agents as the cause of diseases classified elsewhere: Secondary | ICD-10-CM

## 2023-11-15 DIAGNOSIS — J22 Unspecified acute lower respiratory infection: Secondary | ICD-10-CM

## 2023-11-15 DIAGNOSIS — J208 Acute bronchitis due to other specified organisms: Secondary | ICD-10-CM | POA: Diagnosis present

## 2023-11-15 DIAGNOSIS — R300 Dysuria: Secondary | ICD-10-CM | POA: Diagnosis not present

## 2023-11-15 LAB — POCT URINALYSIS DIP (CLINITEK)
Bilirubin, UA: NEGATIVE
Glucose, UA: NEGATIVE mg/dL
Ketones, POC UA: NEGATIVE mg/dL
Leukocytes, UA: NEGATIVE
Nitrite, UA: NEGATIVE
Spec Grav, UA: 1.015 (ref 1.010–1.025)
Urobilinogen, UA: 0.2 U/dL
pH, UA: 6 (ref 5.0–8.0)

## 2023-11-15 MED ORDER — AZITHROMYCIN 250 MG PO TABS: ORAL_TABLET | ORAL | 0 refills | Status: AC

## 2023-11-15 MED ORDER — CEFDINIR 300 MG PO CAPS: 300.0000 mg | ORAL_CAPSULE | Freq: Two times a day (BID) | ORAL | 0 refills | Status: DC

## 2023-11-15 NOTE — Telephone Encounter (Signed)
 Copied from CRM [id number]. Topic: Clinical - Red Word Triage >> Nov 15, 2023 10:21 AM Izetta Dakin wrote: [facility] that prompted transfer to Nurse Triage: Increased cough, fatigue. Tested positive for COVID.  Chief Complaint: increasing cough, fatigue, headache, sore throat Symptoms: see above Frequency: constant Pertinent Negatives: Patient denies fever, sob, cp Disposition: [] ED /[] Urgent Care (no appt availability in office) / [x] Appointment(In office/virtual)/ []  [facility]/ [] Home Care/ [] Refused Recommended Disposition /[] [facility]/ []  Follow-up with PCP Additional Notes: per protocol apt made for this afternoon.  Care advice given, denies questions; instructed to go to ER if becomes worse.   Reason for Disposition  [1] COVID-19 infection suspected by caller or triager AND [2] mild symptoms (cough, fever, or others) AND [3] negative COVID-19 rapid test  Answer Assessment - Initial Assessment Questions 1. COVID-19 DIAGNOSIS: "How do you know that you have COVID?" (e.g., positive lab test or self-test, diagnosed by doctor or NP/PA, symptoms after exposure).     At the doctors office on Monday 2. COVID-19 EXPOSURE: "Was there any known exposure to COVID before the symptoms began?" CDC Definition of close contact: within 6 feet (2 meters) for a total of 15 minutes or more over a 24-hour period.      Positive for covid 3. ONSET: "When did the COVID-19 symptoms start?"      The  Saturday before 4. WORST SYMPTOM: "What is your worst symptom?" (e.g., cough, fever, shortness of breath, muscle aches)     Cough, fatigue 5. COUGH: "Do you have a cough?" If Yes, ask: "How bad is the cough?"       Yes, moderate 6. FEVER: "Do you have a fever?" If Yes, ask: "What is your temperature, how was it measured, and when did it start?"     denies 7. RESPIRATORY STATUS: "Describe your breathing?" (e.g., normal; shortness of breath, wheezing, unable to speak)      denies 8.  BETTER-SAME-WORSE: "Are you getting better, staying the same or getting worse compared to yesterday?"  If getting worse, ask, "In what way?"     Worse with cough 9. OTHER SYMPTOMS: "Do you have any other symptoms?"  (e.g., chills, fatigue, headache, loss of smell or taste, muscle pain, sore throat)     Fatigue, sore throat, loss of smell, headache 10. HIGH RISK DISEASE: "Do you have any chronic medical problems?" (e.g., asthma, heart or lung disease, weak immune system, obesity, etc.)       copd 11. VACCINE: "Have you had the COVID-19 vaccine?" If Yes, ask: "Which one, how many shots, when did you get it?"       denies 12. PREGNANCY: "Is there any chance you are pregnant?" "When was your last menstrual period?"       na 13. O2 SATURATION MONITOR:  "Do you use an oxygen saturation monitor (pulse oximeter) at home?" If Yes, ask "What is your reading (oxygen level) today?" "What is your usual oxygen saturation reading?" (e.g., 95%)       na  Protocols used: Coronavirus (COVID-19) Diagnosed or Suspected-A-AH

## 2023-11-15 NOTE — Progress Notes (Signed)
   Subjective:    Patient ID: Isabel Atkinson, female    DOB: [birth year], 58 y.o.   MRN: [id number]  HPI  Cough and chest congestion productive yellow /green Weakness , dizziness- throat sore from coughing - missing work  Discussed the use of AI scribe software for clinical note transcription with the patient, who gave verbal consent to proceed.  History of Present Illness   Isabel Atkinson is a 58 year old female who presents with severe fatigue and increased coughing following a recent COVID-19 infection.  She experiences severe fatigue and increased coughing following a recent COVID-19 infection. She was able to work on Thursday but felt unwell and could not work on Friday. She attempted to work yesterday but had to leave due to feeling unwell. She describes waking up in the middle of the night feeling dizzy, which resolved by the time she reached the bathroom. Her energy levels are described as 'awful,' and she finds the situation 'scary.' Despite these symptoms, her appetite and hydration appear adequate, as she is eating and drinking well, and her urine output is normal.  She is able to breathe well but notes increased wheezing and coughing more than usual. The cough is productive, with green sputum in the mornings that changes color as the day progresses. No sweats, chills, or fever, and she has not had a fever since last year. She has not used an inhaler for wheezing, as it does not make her feel short of breath.  She has experienced COVID-19 three times, with the first occurrence when COVID-19 initially emerged. She mentions using maximum strength Mucinex for her symptoms.      Review of Systems     Objective:   Physical Exam Gen-NAD not toxic TMS-normal bilateral T- normal no redness Chest-CTA respiratory rate normal no crackles CV RRR no murmur Skin-warm dry Neuro-grossly normal        Assessment & Plan:  1. Dysuria (Primary) I doubt urinary tract infection based  on what I am seeing - POCT URINALYSIS DIP (CLINITEK)  2. Acute bacterial bronchitis Antibiotics prescribed Patient is post COVID now having secondary infection could well be bacterial chest x-ray lab work ordered - DG Chest 2 View - CBC with Differential - Comprehensive metabolic panel with GFR  3. Other fatigue Lab work ordered - CBC with Differential - Comprehensive metabolic panel with GFR  Warning signs discussed

## 2023-11-16 ENCOUNTER — Other Ambulatory Visit: Payer: Self-pay | Admitting: Family Medicine

## 2023-11-16 ENCOUNTER — Telehealth: Payer: Self-pay

## 2023-11-16 ENCOUNTER — Encounter: Payer: Self-pay | Admitting: Family Medicine

## 2023-11-16 LAB — COMPREHENSIVE METABOLIC PANEL WITH GFR
ALT: 14 IU/L (ref 0–32)
AST: 13 IU/L (ref 0–40)
Albumin: 4.5 g/dL (ref 3.8–4.9)
Alkaline Phosphatase: 67 IU/L (ref 44–121)
BUN/Creatinine Ratio: 22 (ref 9–23)
BUN: 12 mg/dL (ref 6–24)
Bilirubin Total: 0.2 mg/dL (ref 0.0–1.2)
CO2: 23 mmol/L (ref 20–29)
Calcium: 9.4 mg/dL (ref 8.7–10.2)
Chloride: 105 mmol/L (ref 96–106)
Creatinine, Ser: 0.55 mg/dL — ABNORMAL LOW (ref 0.57–1.00)
Globulin, Total: 2.1 g/dL (ref 1.5–4.5)
Glucose: 72 mg/dL (ref 70–99)
Potassium: 3.8 mmol/L (ref 3.5–5.2)
Sodium: 143 mmol/L (ref 134–144)
Total Protein: 6.6 g/dL (ref 6.0–8.5)
eGFR: 106 mL/min/{1.73_m2} (ref >59)

## 2023-11-16 LAB — CBC WITH DIFFERENTIAL/PLATELET
Basophils Absolute: 0 10*3/uL (ref 0.0–0.2)
Basos: 0 %
EOS (ABSOLUTE): 0.2 10*3/uL (ref 0.0–0.4)
Eos: 2 %
Hematocrit: 43 % (ref 34.0–46.6)
Hemoglobin: 13.8 g/dL (ref 11.1–15.9)
Immature Grans (Abs): 0 10*3/uL (ref 0.0–0.1)
Immature Granulocytes: 0 %
Lymphocytes Absolute: 3.2 10*3/uL — ABNORMAL HIGH (ref 0.7–3.1)
Lymphs: 31 %
MCH: 29.2 pg (ref 26.6–33.0)
MCHC: 32.1 g/dL (ref 31.5–35.7)
MCV: 91 fL (ref 79–97)
Monocytes Absolute: 0.8 10*3/uL (ref 0.1–0.9)
Monocytes: 8 %
Neutrophils Absolute: 6.2 10*3/uL (ref 1.4–7.0)
Neutrophils: 59 %
Platelets: 353 10*3/uL (ref 150–450)
RBC: 4.72 x10E6/uL (ref 3.77–5.28)
RDW: 12.7 % (ref 11.7–15.4)
WBC: 10.4 10*3/uL (ref 3.4–10.8)

## 2023-11-16 MED ORDER — DOXYCYCLINE HYCLATE 100 MG PO TABS: 100.0000 mg | ORAL_TABLET | Freq: Two times a day (BID) | ORAL | 0 refills | Status: DC

## 2023-11-16 NOTE — Telephone Encounter (Signed)
 Certainly sorry she is having such trouble Please write her a work note through the weekend Hopefully she will have the energy to go back to work on Monday if not she will let us know

## 2023-11-16 NOTE — Telephone Encounter (Signed)
 Received a Rx refill request for Prolia. Spoke with pt, she requested to post pone Prolia injection until she discusses other options with Dr.Nida. Made pt aware that Dr.Nida unexpectedly has to be out of the office and discussed with her the possible affects of discontinuing Prolia. Pt voiced understanding and stated she does not want to continue treatment with Prolia until she is able to discuss other options with Dr.Nida.

## 2023-11-17 NOTE — Telephone Encounter (Signed)
--  spoke with Marylene Land and she states, the insurance has denied the toxassure drug test from 07/12/24 with the current Dx code which was originally given. Additional specific Dx code is needed, please advise. Refer to spec # [id number]  Copied from CRM [id number]. Topic: Clinical - Lab/Test Results >> Nov 16, 2023  4:27 PM Tiffany S wrote: Reason for CRM: Marylene Land from [facility] calling to get additional DX codes for test 2024-07-12 [id number]

## 2023-11-17 NOTE — Telephone Encounter (Signed)
 So this is the conundrum  The government/medical Board/DEA-require [place] to do at the minimum a yearly urine drug screen when patients are on chronic pain medications  The insurance companies are routinely denying to cover these tests  My request-please connect with [facility] and asked them to let us know what diagnosis might be covered.  I am submitting the proper diagnosis codes for these test but obviously her insurance company wants to reject it.  An additional measure the patient can do is to call her insurance company and let them know that she is on chronic pain medicine and that her doctor is required to do a yearly drug screen and she would like to know what diagnosis code could be used to help get the urine drug screen covered  Personally-I am at a loss of what else to submit-I feel that I have already submitted the proper codes but insurance company are doing the things that insurance companies do best which is making things hard to be covered  I am more than willing to resubmit with additional codes if given further guidance either from [facility] or from the patient after she talks with her insurance company

## 2023-11-24 ENCOUNTER — Other Ambulatory Visit: Payer: Self-pay

## 2023-11-24 ENCOUNTER — Emergency Department (HOSPITAL_COMMUNITY)
Admission: EM | Admit: 2023-11-24 | Discharge: 2023-11-25 | Discharge status: Against Medical Advice | Attending: Emergency Medicine | Admitting: Emergency Medicine

## 2023-11-24 ENCOUNTER — Emergency Department (HOSPITAL_COMMUNITY)

## 2023-11-24 DIAGNOSIS — R531 Weakness: Secondary | ICD-10-CM | POA: Diagnosis present

## 2023-11-24 DIAGNOSIS — R42 Dizziness and giddiness: Secondary | ICD-10-CM | POA: Diagnosis not present

## 2023-11-24 DIAGNOSIS — Z8616 Personal history of COVID-19: Secondary | ICD-10-CM | POA: Insufficient documentation

## 2023-11-24 DIAGNOSIS — R002 Palpitations: Secondary | ICD-10-CM | POA: Diagnosis not present

## 2023-11-24 DIAGNOSIS — Z5321 Procedure and treatment not carried out due to patient leaving prior to being seen by health care provider: Secondary | ICD-10-CM | POA: Diagnosis not present

## 2023-11-24 LAB — CBC
HCT: 40.1 % (ref 36.0–46.0)
Hemoglobin: 12.9 g/dL (ref 12.0–15.0)
MCH: 30.4 pg (ref 26.0–34.0)
MCHC: 32.2 g/dL (ref 30.0–36.0)
MCV: 94.4 fL (ref 80.0–100.0)
Platelets: 293 10*3/uL (ref 150–400)
RBC: 4.25 MIL/uL (ref 3.87–5.11)
RDW: 13.4 % (ref 11.5–15.5)
WBC: 8.4 10*3/uL (ref 4.0–10.5)
nRBC: 0 % (ref 0.0–0.2)

## 2023-11-24 LAB — URINALYSIS, ROUTINE W REFLEX MICROSCOPIC
Bacteria, UA: NONE SEEN
Bilirubin Urine: NEGATIVE
Glucose, UA: NEGATIVE mg/dL
Ketones, ur: 5 mg/dL — AB
Leukocytes,Ua: NEGATIVE
Nitrite: NEGATIVE
Protein, ur: 30 mg/dL — AB
Specific Gravity, Urine: 1.021 (ref 1.005–1.030)
pH: 6 (ref 5.0–8.0)

## 2023-11-24 LAB — BASIC METABOLIC PANEL WITH GFR
Anion gap: 8 (ref 5–15)
BUN: 14 mg/dL (ref 6–20)
CO2: 27 mmol/L (ref 22–32)
Calcium: 9.1 mg/dL (ref 8.9–10.3)
Chloride: 102 mmol/L (ref 98–111)
Creatinine, Ser: 0.54 mg/dL (ref 0.44–1.00)
GFR, Estimated: >60 mL/min (ref >60)
Glucose, Bld: 105 mg/dL — ABNORMAL HIGH (ref 70–99)
Potassium: 3.6 mmol/L (ref 3.5–5.1)
Sodium: 137 mmol/L (ref 135–145)

## 2023-11-24 LAB — TROPONIN I (HIGH SENSITIVITY)
Troponin I (High Sensitivity): 6 ng/L (ref <18)
Troponin I (High Sensitivity): 7 ng/L (ref <18)

## 2023-11-24 NOTE — ED Triage Notes (Signed)
 Pt states she had COVID 3 weeks ago and "hasn't been right since". Reports she has felt weak and dizzy, feels that her BP and HR increase with any activity.  Pt states her HR has been as high as 112 and highest BP was 136/101.

## 2023-12-06 ENCOUNTER — Telehealth: Payer: Self-pay

## 2023-12-06 NOTE — Telephone Encounter (Signed)
 Left a message requesting pt return call to the office.

## 2023-12-12 ENCOUNTER — Ambulatory Visit: Payer: 59

## 2023-12-19 ENCOUNTER — Other Ambulatory Visit: Payer: Self-pay | Admitting: Family Medicine

## 2023-12-20 ENCOUNTER — Other Ambulatory Visit: Payer: Self-pay | Admitting: Nurse Practitioner

## 2023-12-20 ENCOUNTER — Telehealth: Payer: Self-pay | Admitting: *Deleted

## 2023-12-20 ENCOUNTER — Encounter: Payer: Self-pay | Admitting: Family Medicine

## 2023-12-20 MED ORDER — HYDROCODONE-ACETAMINOPHEN 10-325 MG PO TABS: ORAL_TABLET | ORAL | 0 refills | Status: DC

## 2023-12-20 MED ORDER — NYSTATIN 100000 UNIT/ML MT SUSP: 5.0000 mL | Freq: Four times a day (QID) | OROMUCOSAL | 0 refills | Status: AC | PRN

## 2023-12-20 NOTE — Telephone Encounter (Signed)
 Copied from CRM [id number]. Topic: Clinical - Prescription Issue >> Dec 20, 2023  2:26 PM Elle L wrote: Reason for CRM: Pharmacist Brandon with [facility] is requesting to speak to NP Michelle Aid regarding substituting ingredients in the patient's magic mouthwash (nystatin , hydrocortisone , diphenhydrAMINE , lidocaine ) suspension prescription.

## 2023-12-20 NOTE — Telephone Encounter (Signed)
 Pharmacy contacted. Dexamethasone  substituted

## 2023-12-30 ENCOUNTER — Encounter: Payer: Self-pay | Admitting: Family Medicine

## 2023-12-30 ENCOUNTER — Ambulatory Visit: Payer: Self-pay | Admitting: Family Medicine

## 2023-12-30 ENCOUNTER — Ambulatory Visit: Admitting: Family Medicine

## 2023-12-30 VITALS — BP 132/79 | HR 78 | Temp 98.3°F | Ht 62.0 in | Wt 97.4 lb

## 2023-12-30 DIAGNOSIS — J029 Acute pharyngitis, unspecified: Secondary | ICD-10-CM

## 2023-12-30 DIAGNOSIS — K1370 Unspecified lesions of oral mucosa: Secondary | ICD-10-CM

## 2023-12-30 DIAGNOSIS — R319 Hematuria, unspecified: Secondary | ICD-10-CM

## 2023-12-30 DIAGNOSIS — K1379 Other lesions of oral mucosa: Secondary | ICD-10-CM

## 2023-12-30 LAB — POCT URINE DIPSTICK
Spec Grav, UA: 1.015 (ref 1.010–1.025)
pH, UA: 6 (ref 5.0–8.0)

## 2023-12-30 LAB — POCT RAPID STREP A (OFFICE): Rapid Strep A Screen: NEGATIVE

## 2023-12-30 MED ORDER — NYSTATIN 100000 UNIT/ML MT SUSP: 5.0000 mL | Freq: Four times a day (QID) | OROMUCOSAL | 0 refills | Status: AC

## 2023-12-30 NOTE — Progress Notes (Signed)
   Subjective:    Patient ID: Isabel Atkinson, female    DOB: 03/17/1966, 58 y.o.   MRN: [id number]  HPI [place] symptoms over the past week with soreness in the roof of the mouth and in the throat also soreness in the anterior portion of the neck where the lymph nodes are in addition to this denies high fever chills sweats relate some head congestion postnasal drainage some coughing some congestion no wheezing or difficulty breathing has history of COPD  Patient also has history of chronic back pain and pain into her hips and legs she takes 1 or 1-1/2 tablets on a daily basis when necessary but she tries to minimize her use of pain medicine she denies any drowsiness with the medicine She relates that her medication does not help alleviate the pain to bring down to a tolerable level for which she can function  She also relates she is has been to various doctor offices for various issues when they check her urine shows blood reviewing the record it shows multiple dipsticks that show blood but 1 microscopic exam that showed blood she is seeing Dr. Inga Manges previously he has done a cystoscopy which did not show any lesions she is a smoker Review of Systems     Objective:   Physical Exam  Gen-NAD not toxic TMS-normal bilateral T- normal no redness Chest-CTA respiratory rate normal no crackles CV RRR no murmur Skin-warm dry Neuro-grossly normal On mouth exam she is tender on the roof of her mouth and she has a hard nodule on the top of the roof of her mouth  Under the microscope 2-3 RBCs per hpf    Assessment & Plan:   1. Hematuria, unspecified type (Primary) Under the microscope 2-3 RBC per hpf Recommend referral to Dr.Wrenn--patient states she is a patient of their practices and she will go ahead and call them for an appointment-with the patient being a smoker she is at higher risk of bladder cancer she may need to have another cystoscopy - POCT URINE DIPSTICK  2. Sore  throat Strep test negative - POCT rapid strep A  3. Mouth pain Recommend nystatin  oral solution see below - Ambulatory referral to ENT-referral sent to Dr.Teoh to help address the nodule on the roof of her mouth  4. Mouth lesion The small lesion on the top of the roof of the mouth it is hard to know if it is because that might be scar tissue from previous infection but because she is a smoker I recommend for this to be seen

## 2024-01-08 HISTORY — PX: DENTAL SURGERY: SHX609

## 2024-01-16 ENCOUNTER — Encounter: Payer: Self-pay | Admitting: Internal Medicine

## 2024-01-16 DIAGNOSIS — R002 Palpitations: Secondary | ICD-10-CM

## 2024-01-17 ENCOUNTER — Ambulatory Visit: Attending: Internal Medicine

## 2024-01-17 DIAGNOSIS — R002 Palpitations: Secondary | ICD-10-CM

## 2024-01-17 NOTE — Telephone Encounter (Signed)
 Please set patient up for a 2 wk monitor   Evaluate for palpitaitons

## 2024-01-20 ENCOUNTER — Other Ambulatory Visit: Payer: Self-pay | Admitting: Family Medicine

## 2024-01-20 ENCOUNTER — Encounter: Payer: Self-pay | Admitting: Family Medicine

## 2024-01-20 MED ORDER — HYDROCODONE-ACETAMINOPHEN 10-325 MG PO TABS: ORAL_TABLET | ORAL | 0 refills | Status: DC

## 2024-01-20 NOTE — Telephone Encounter (Signed)
 2 refills was sent regarding hydrocodone  This will cover her until approximately August 12 She needs a pain management visit with me late July or early August. These are required by state law every 3 months thank you

## 2024-01-25 ENCOUNTER — Telehealth: Payer: Self-pay

## 2024-01-25 NOTE — Telephone Encounter (Signed)
 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

## 2024-01-25 NOTE — Telephone Encounter (Signed)
 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  Is she capable of driving this type of vehicle? 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

## 2024-01-26 LAB — COMPREHENSIVE METABOLIC PANEL WITH GFR
ALT: 13 IU/L (ref 0–32)
AST: 21 IU/L (ref 0–40)
Albumin: 4.3 g/dL (ref 3.8–4.9)
Alkaline Phosphatase: 61 IU/L (ref 44–121)
BUN/Creatinine Ratio: 26 — ABNORMAL HIGH (ref 9–23)
BUN: 15 mg/dL (ref 6–24)
Bilirubin Total: 0.3 mg/dL (ref 0.0–1.2)
CO2: 22 mmol/L (ref 20–29)
Calcium: 9.4 mg/dL (ref 8.7–10.2)
Chloride: 105 mmol/L (ref 96–106)
Creatinine, Ser: 0.57 mg/dL (ref 0.57–1.00)
Globulin, Total: 1.9 g/dL (ref 1.5–4.5)
Glucose: 83 mg/dL (ref 70–99)
Potassium: 4 mmol/L (ref 3.5–5.2)
Sodium: 143 mmol/L (ref 134–144)
Total Protein: 6.2 g/dL (ref 6.0–8.5)
eGFR: 105 mL/min/{1.73_m2} (ref >59)

## 2024-01-30 ENCOUNTER — Ambulatory Visit: Admitting: "Endocrinology

## 2024-01-30 ENCOUNTER — Telehealth: Payer: Self-pay | Admitting: Family Medicine

## 2024-01-30 ENCOUNTER — Other Ambulatory Visit: Payer: Self-pay | Admitting: Obstetrics & Gynecology

## 2024-01-30 NOTE — Telephone Encounter (Signed)
 DOT form was filled out  This will require a printed copy of her medication list to be attached to this form  Then please fill out the administrative portion at the bottom of the form I have already signed it  Then the patient can pick this up I would recommend that the patient read over this if there is any changes that need to be made to let us  know  Thanks-Dr. Glendia

## 2024-02-06 ENCOUNTER — Encounter: Payer: Self-pay | Admitting: "Endocrinology

## 2024-02-06 ENCOUNTER — Ambulatory Visit: Admitting: "Endocrinology

## 2024-02-06 VITALS — BP 118/56 | HR 92 | Ht 60.25 in | Wt 96.0 lb

## 2024-02-06 DIAGNOSIS — F17200 Nicotine dependence, unspecified, uncomplicated: Secondary | ICD-10-CM

## 2024-02-06 DIAGNOSIS — Z789 Other specified health status: Secondary | ICD-10-CM | POA: Insufficient documentation

## 2024-02-06 DIAGNOSIS — M818 Other osteoporosis without current pathological fracture: Secondary | ICD-10-CM

## 2024-02-06 MED ORDER — TERIPARATIDE 560 MCG/2.24ML ~~LOC~~ SOPN: 20.0000 ug | PEN_INJECTOR | Freq: Every day | SUBCUTANEOUS | 1 refills | Status: DC

## 2024-02-06 NOTE — Progress Notes (Signed)
 02/06/2024        Endocrinology follow-up note  Past Medical History:  Diagnosis Date   Anxiety    COPD (chronic obstructive pulmonary disease) (HCC)    Dysrhythmia    GERD (gastroesophageal reflux disease)    Hypercholesterolemia    Pre-diabetes    Reactive airway disease    Tachycardia    Past Surgical History:  Procedure Laterality Date   CATARACT EXTRACTION W/PHACO Left 01/17/2017   Procedure: CATARACT EXTRACTION PHACO AND INTRAOCULAR LENS PLACEMENT (IOC);  Surgeon: Perley Hamilton, MD;  Location: [facility];  Service: Ophthalmology;  Laterality: Left;  CDE: 6.92   CATARACT EXTRACTION W/PHACO Right 03/07/2020   Procedure: CATARACT EXTRACTION PHACO AND INTRAOCULAR LENS PLACEMENT RIGHT EYE;  Surgeon: Harrie Agent, MD;  Location: [facility];  Service: Ophthalmology;  Laterality: Right;  CDE: 8.44   COLONOSCOPY  2013   RMR: Normal rectum. single diminutive sigmoid polyp noted above. Normal terminal ileum. Status post segmental biopsy. next TCS in 10 years.    COLONOSCOPY WITH PROPOFOL  N/A 01/27/2022   Procedure: COLONOSCOPY WITH PROPOFOL ;  Surgeon: Shaaron Lamar HERO, MD;  [facility];  Service: Endoscopy;  Laterality: N/A;  12:45pm   DENTAL SURGERY  01/2024   ELBOW SURGERY Right 2023   ESOPHAGOGASTRODUODENOSCOPY  2013   RMR: Possible cervical esophageal web-status post dilation as described above. Small hiatal hernia. Status post biopsy of normal appearing small bowel to screen for celiac disease.    ESOPHAGOGASTRODUODENOSCOPY N/A 05/06/2016   normal esophagus, small hiatal hernia, empiric dilation.   ESOPHAGOGASTRODUODENOSCOPY (EGD) WITH PROPOFOL  N/A 01/27/2022   Procedure: ESOPHAGOGASTRODUODENOSCOPY (EGD) WITH PROPOFOL ;  Surgeon: Shaaron Lamar HERO, MD;  [facility];  Service: Endoscopy;  Laterality: N/A;   EYE SURGERY Bilateral    Cat Sx - Symfony MF IOLs   MALONEY DILATION N/A 05/06/2016   Procedure:  MALONEY DILATION;  Surgeon: Lamar HERO Shaaron, MD;  [facility];  Service: Endoscopy;  Laterality: N/A;   MALONEY DILATION N/A 01/27/2022   Procedure: AGAPITO DILATION;  Surgeon: Shaaron Lamar HERO, MD;  [facility];  Service: Endoscopy;  Laterality: N/A;   NECK SURGERY  10/2012   OVARIAN CYST SURGERY  1997   removed   POLYPECTOMY  01/27/2022   Procedure: POLYPECTOMY;  Surgeon: Shaaron Lamar HERO, MD;  [facility];  Service: Endoscopy;;  cecal   ROBOTIC ASSISTED LAPAROSCOPIC HYSTERECTOMY AND SALPINGECTOMY Bilateral 12/14/2022   Procedure: XI ROBOTIC ASSISTED LAPAROSCOPIC HYSTERECTOMY AND SALPINGO-OOPHORECTOMY;  Surgeon: Jayne Vonn DEL, MD;  Location: [facility];  Service: Gynecology;  Laterality: Bilateral;   TUBAL LIGATION  2000   VAGINAL PROLAPSE REPAIR N/A 12/14/2022   Procedure: VAGINAL VAULT SUSPENSION;  Surgeon: Jayne Vonn DEL, MD;  Location: [facility];  Service: Gynecology;  Laterality: N/A;   YAG LASER APPLICATION Bilateral    Social History   Socioeconomic History   Marital status: Married    Spouse name: Not on file   Number of children: 2   Years of education: GED   Highest education level: 11th grade  Occupational History   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    Employer: [employer]  Tobacco  Use   Smoking status: Every Day    Current packs/day: 1.00    Average packs/day: 1 pack/day for 25.0 years (25.0 ttl pk-yrs)    Types: Cigarettes   Smokeless tobacco: Never  Vaping Use   Vaping status: Never Used  Substance and Sexual Activity   Alcohol use: No    Alcohol/week: 0.0 standard drinks of alcohol   Drug use: No   Sexual activity: Yes    Birth control/protection: Post-menopausal, Surgical    Comment: hysterstectomy  Other Topics Concern   Not on file  Social History Narrative   Lives at home w/ her husband   2 children - ages 44 & 19   Right-sided   Caffeine: 2 beverages per day   Social Drivers of Corporate investment banker Strain:  Low Risk  (06/08/2022)   Overall Financial Resource Strain (CARDIA)    Difficulty of Paying Living Expenses: Not hard at all  Food Insecurity: No Food Insecurity (03/18/2023)   Hunger Vital Sign    Worried About Running Out of Food in the Last Year: Never true    Ran Out of Food in the Last Year: Never true  Transportation Needs: No Transportation Needs (03/18/2023)   PRAPARE - Administrator, Civil Service (Medical): No    Lack of Transportation (Non-Medical): No  Physical Activity: Unknown (03/18/2023)   Exercise Vital Sign    Days of Exercise per Week: Patient declined    Minutes of Exercise per Session: Not on file  Stress: No Stress Concern Present (03/18/2023)   [employer] of Occupational Health - Occupational Stress Questionnaire    Feeling of Stress : Not at all  Social Connections: Moderately Integrated (03/18/2023)   Social Connection and Isolation Panel    Frequency of Communication with Friends and Family: More than three times a week    Frequency of Social Gatherings with Friends and Family: Twice a week    Attends Religious Services: More than 4 times per year    Active Member of [employer] or Organizations: No    Attends Banker Meetings: Not on file    Marital Status: Married   Outpatient Encounter Medications as of 02/06/2024  Medication Sig   Teriparatide (FORTEO) 560 MCG/2.24ML SOPN Inject 20 mcg into the skin daily at 2 PM.   ALPRAZolam  (XANAX ) 0.25 MG tablet TAKE 1/2 TO 1 TABLET BY MOUTH TWICE DAILY AS NEEDED FOR ANXIETY.   aspirin  EC 81 MG tablet Take 81 mg by mouth daily. Swallow whole.   Bromfenac  Sodium (PROLENSA ) 0.07 % SOLN Place 1 drop into the right eye daily.   Cholecalciferol (VITAMIN D3) 125 MCG (5000 UT) TABS Take 1 tablet (5,000 Units total) by mouth daily.   cycloSPORINE (RESTASIS) 0.05 % ophthalmic emulsion Place 1 drop into both eyes 2 (two) times daily.   doxycycline  (VIBRA -TABS) 100 MG tablet Take 1 tablet (100 mg total) by  mouth 2 (two) times daily.   EPINEPHRINE  0.3 mg/0.3 mL IJ SOAJ injection INJECT INTO OUTER THIGH AND HOLD AGAINST LEG FOR 10 SECONDS FOR SEVERE ALLERGIC REACTION.   estradiol  (ESTRACE ) 0.1 MG/GM vaginal cream Place 0.5 Applicatorfuls vaginally every other day.   HYDROcodone -acetaminophen  (NORCO) 10-325 MG tablet 1 twice daily as needed pain   HYDROcodone -acetaminophen  (NORCO) 10-325 MG tablet 1 twice daily as needed pain   HYDROcodone -acetaminophen  (NORCO) 10-325 MG tablet 1 bid prn pain   ibuprofen  (ADVIL ) 600 MG tablet Take 1 tablet (600 mg total) by mouth every 8 (eight)  hours as needed.   magic mouthwash (nystatin , hydrocortisone , diphenhydrAMINE , lidocaine ) suspension Swish and spit 5 mLs 4 (four) times daily as needed for mouth pain.   nystatin  (MYCOSTATIN ) 100000 UNIT/ML suspension Take 5 mLs (500,000 Units total) by mouth 4 (four) times daily.   omeprazole  (PRILOSEC) 40 MG capsule Take 1 capsule (40 mg total) by mouth daily as needed.   ondansetron  (ZOFRAN -ODT) 8 MG disintegrating tablet DISSOLVE 1 TABLET ON TONGUE EVERY 8 HOURS AS NEEDED FOR NAUSEA/VOMITING   OVER THE COUNTER MEDICATION mucinex   rosuvastatin  (CRESTOR ) 10 MG tablet Take 1 tablet (10 mg total) by mouth daily.   Triamcinolone  Acetonide (NASACORT  ALLERGY 24HR NA) Place 2 sprays into the nose daily.   [DISCONTINUED] denosumab  (PROLIA ) 60 MG/ML SOSY injection Inject 60 mg into the skin every 6 (six) months.   No facility-administered encounter medications on file as of 02/06/2024.   ALLERGIES: Allergies  Allergen Reactions   Ciprofloxacin  Other (See Comments)    Patient states with in five minutes of taking developed a splitting headache,itching all over and burning in mouth and lips.   Augmentin  [Amoxicillin -Pot Clavulanate] Diarrhea   Carafate  [Sucralfate ]     Mouth tingling   Celexa  [Citalopram  Hydrobromide] Nausea And Vomiting   Chantix [Varenicline] Nausea And Vomiting   Sulfa Antibiotics Itching   Sulfur Itching    Wellbutrin  [Bupropion ]     Severe weight loss and loss of appetite   Cefdinir  Diarrhea    Severe diarrhea   Fosamax  [Alendronate  Sodium] Nausea And Vomiting    GERD can not tolerate   Gabapentin      Sleepy and did not help    VACCINATION STATUS: Immunization History  Administered Date(s) Administered   Influenza, Seasonal, Injecte, Preservative Fre 06/06/2023   Influenza,inj,Quad PF,6+ Mos 09/14/2017, 07/04/2018, 04/25/2019, 07/14/2020, 07/16/2022   PFIZER(Purple Top)SARS-COV-2 Vaccination 10/14/2019, 11/04/2019   PNEUMOCOCCAL CONJUGATE-20 03/22/2023   Pneumococcal Polysaccharide-23 04/25/2019   Tdap 01/19/2018   Zoster Recombinant(Shingrix) 03/04/2022   Zoster, Unspecified 08/10/2022    HPI   Isabel Atkinson is 58 y.o. female who presents today with a medical history as above. she is being seen in follow-up after she was seen in consultation for osteoporosis requested by Alphonsa Glendia LABOR, MD. She is status post 4 treatments of Prolia  subcutaneous injection. Her last injection was on June 14, 2023.  She underwent dental extraction in December 2024.   This procedure went very well.   There was no documented history of medication related osteonecrosis of the jaw. She is concerned about the rare complications of osteoporosis medications including osteonecrosis of the jaw including Prolia , and she would like to avoid this intervention, and skipped  her May 2025 injection.  She is asking for her other options. She has no interval falls nor fractures.   Her March 2024  DEXA scan showed improvement in her osteoporosis.  Patient was diagnosed with osteoporosis  approximately  10 years ago. No dizziness/vertigo/orthostasis. She was previously treated with Fosamax  which she did not tolerate.  She was also treated with what appears to be Reclast infusion also did not tolerate prior to 5 years ago. She has history of vitamin D  deficiency currently on vitamin D  and calcium   supplements.  She also eats dairy and green, leafy, vegetables.   No weight bearing exercises.  She denies loss of height. She has prior exposure to on and off steroids due to COPD/reactive airway disease, however denies chronic exposure to heavy dose steroids. She denies any prior history of parathyroid, thyroid  dysfunctions.  No history of nephrolithiasis.  Her most recent calcium  level is 9.6.  No h/o CKD. Last BUN/Cr: 15/0.56  Menopause was at 58 y/o.   Pt does not have a FH of osteoporosis.  She has dental implants, however no scheduled dental procedure in the next near future.   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  She remains an active smoker.  She also has history of irritable bowel syndrome, prediabetes. She has received a steroid injection for hamstring tendinitis today.  Review of Systems  Constitutional: + No recent major weight change, she has always been light build,  + fatigue, no subjective hyperthermia, no subjective hypothermia   Objective:    BP (!) 118/56   Pulse 92   Ht 5' 0.25 (1.53 m)   Wt 96 lb (43.5 kg)   BMI 18.59 kg/m   Wt Readings from Last 3 Encounters:  02/06/24 96 lb (43.5 kg)  12/30/23 97 lb 6.4 oz (44.2 kg)  11/15/23 94 lb (42.6 kg)    Physical Exam  Constitutional: + BMI of 17.78, not in acute distress, normal state of mind Eyes: PERRLA, EOMI, no exophthalmos ENT: moist mucous membranes, no thyromegaly, no cervical lymphadenopathy   CMP ( most recent) CMP     Component Value Date/Time   NA 143 01/25/2024 0910   K 4.0 01/25/2024 0910   CL 105 01/25/2024 0910   CO2 22 01/25/2024 0910   GLUCOSE 83 01/25/2024 0910   GLUCOSE 105 (H) 11/24/2023 1946   BUN 15 01/25/2024 0910   CREATININE 0.57 01/25/2024 0910   CREATININE 0.55 11/03/2013 0951   CALCIUM  9.4 01/25/2024 0910   PROT 6.2 01/25/2024 0910   PROT 7.0 12/09/2011 0900   ALBUMIN 4.3 01/25/2024 0910   AST 21  01/25/2024 0910   AST 18 12/09/2011 0900   ALT 13 01/25/2024 0910   ALKPHOS 61 01/25/2024 0910   ALKPHOS 76 12/09/2011 0900   BILITOT 0.3 01/25/2024 0910   BILITOT 0.3 12/09/2011 0900   GFRNONAA >60 11/24/2023 1946   GFRAA 121 01/11/2020 0811    Lipid Panel     Component Value Date/Time   CHOL 182 08/12/2023 0910   TRIG 66 10/04/2023 1509   HDL 70 10/04/2023 1509   CHOLHDL 2.7 08/12/2023 0910   CHOLHDL 2.5 11/03/2013 0951   VLDL 13 11/03/2013 0951   LDLCALC 98 08/12/2023 0910   LABVLDL 17 08/12/2023 0910      Lab Results  Component Value Date   TSH 1.310 10/23/2020   TSH 1.430 01/11/2020   TSH 1.110 12/01/2017   TSH 1.030 01/14/2016   TSH 1.030 09/10/2015   TSH 0.962 04/25/2015   TSH 0.924 11/03/2013   FREET4 1.32 10/23/2020   FREET4 1.45 01/14/2016    Her previsit bone density on October 13, 2022 [facility] Spine L1-L4 (L2) 10/13/2022 58.4 Osteopenia -2.4 0.883 g/cm2 6.5% Yes AP Spine L1-L4 (L2) 10/08/2020 56.4 Osteoporosis -2.8 0.829 g/cm2 -9.5% Yes AP Spine L1-L4 (L2) 06/26/2015 51.1 Osteopenia -2.1 0.916 g/cm2 4.2% Yes AP Spine L1-L4 (L2) 05/18/2013 49.0 Osteopenia -2.4 0.879 g/cm2 - -   DualFemur Neck Right 10/13/2022 58.4 Osteopenia -2.4 0.708 g/cm2 7.4% Yes DualFemur Neck Right 10/08/2020 56.4 Osteoporosis -2.7 0.659 g/cm2 -7.7% Yes DualFemur Neck Right 06/26/2015 51.1 Osteopenia -2.3 0.714 g/cm2 4.7% - DualFemur Neck Right 05/18/2013 49.0 Osteoporosis -2.6 0.682 g/cm2 - -   DualFemur Total Mean 10/13/2022 58.4 Osteopenia -1.9 0.765 g/cm2 5.4% Yes  DualFemur Total Mean 10/08/2020 56.4 Osteopenia -2.2 0.726 g/cm2 -9.5% Yes DualFemur Total Mean 06/26/2015 51.1 Osteopenia -1.6 0.802 g/cm2 3.9% Yes DualFemur Total Mean 05/18/2013 49.0 Osteopenia -1.9 0.772 g/cm2 - -   ASSESSMENT: The BMD measured at Femur Neck Right is 0.708 g/cm2 with a T-score of -2.4. This patient is considered osteopenic according to World Health Organization [facility]) criteria. The scan  quality is good. Compared with the prior study on 10/08/20 , the BMD of the total mean and ap spine show a statistically significant increase. L2 was excluded due to advanced degenerative changes. Patient is not a candidate for FRAX assessment due to being on Prolia .    Assessment: 1. Osteoporosis 2.  Bisphosphonates intolerance, GERD  Plan: 1. Osteoporosis -Likely multifactorial including postmenopausal, steroids, smoking.   -I discussed her available bone density studies including her most recent one on October 13, 2022.    It appears that she has benefited from the treatment she has received over the years.  There is documented/intolerance to bisphosphonates oral and IV.  Due to her concern in ONJ, she would like to avoid Prolia  injection after receiving 4 treatments, she has skipped her May 2025 treatment. -She has severe GERD, did not tolerate oral bisphosphonates.  She did not tolerate IV bisphosphonate either.  she is concerned about potential complications of Prolia  in  her dental health, and she is asking for her next options.  She is at risk of losing what she has gained with Prolia  treatment since she has not backed up antiresorptive treatments. I did discuss her options including Forteo.  She does not have contraindications for Forteo specifically no history of bone malignancy or radiation , has normal CMP including calcium  levels. she has not tried this medication before.  If her insurance provides coverage, she will benefit from this intervention.  I discussed initiated Forteo 20 mcg subcutaneously daily.  She will have repeat bone density in March 2026.    If her insurance does not pay for Forteo, unfortunately this will limit her options of treatment.  Patient is a heavy chronic smoker not a suitable candidate for raloxifene.  She is advised to consider smoking cessation and continue resistance exercise     She has no evidence of primary hyperparathyroidism nor thyroid  dysfunction.    - we reviewed her dietary and supplemental calcium  and vitamin D  intake, which I advised her to increase her vitamin D  to 2000 units daily, calcium  750 mg twice a day.  We also discussed food sources of this elements and vitamin.  - discussed fall precautions   The patient was counseled on the dangers of tobacco use, and was advised to quit.  Reviewed strategies to maximize success, including removing cigarettes and smoking materials from environment.   - I advised patient to maintain close follow up with Alphonsa Glendia LABOR, MD for primary ae needs.   I spent  26  minutes in the care of the patient today including review of labs from Thyroid Function, CMP, and other relevant labs ; imaging/biopsy records (current and previous including abstractions from other facilities); face-to-face time discussing  her lab results and symptoms, medications doses, her options of short and long term treatment based on the latest standards of care / guidelines;   and documenting the encounter.  Jori GORMAN Anon  participated in the discussions, expressed understanding, and voiced agreement with the above plans.  All questions were answered to her satisfaction. she is encouraged to contact clinic should she have any  questions or concerns prior to her return visit.     Follow up plan: Return in about 6 months (around 08/07/2024) for F/U with Pre-visit Labs.   Ranny Earl, MD [facility] [address] [place], [place] [address] Phone: [phone]  Fax: [phone]     02/06/2024, 5:07 PM  This note was partially dictated with voice recognition software. Similar sounding words can be transcribed inadequately or may not  be corrected upon review.

## 2024-02-07 ENCOUNTER — Telehealth: Payer: Self-pay

## 2024-02-07 NOTE — Telephone Encounter (Signed)
 Left a message requesting pt return call to the office.

## 2024-02-08 ENCOUNTER — Other Ambulatory Visit: Payer: Self-pay

## 2024-02-08 DIAGNOSIS — M818 Other osteoporosis without current pathological fracture: Secondary | ICD-10-CM

## 2024-02-08 MED ORDER — TERIPARATIDE 560 MCG/2.24ML ~~LOC~~ SOPN: 20.0000 ug | PEN_INJECTOR | Freq: Every day | SUBCUTANEOUS | 1 refills | Status: AC

## 2024-02-08 NOTE — Telephone Encounter (Signed)
 Left a message requesting pt return call to the office.

## 2024-02-13 ENCOUNTER — Other Ambulatory Visit (HOSPITAL_COMMUNITY): Payer: Self-pay

## 2024-02-13 ENCOUNTER — Telehealth: Payer: Self-pay

## 2024-02-13 ENCOUNTER — Ambulatory Visit: Payer: Self-pay | Admitting: Internal Medicine

## 2024-02-13 DIAGNOSIS — R002 Palpitations: Secondary | ICD-10-CM | POA: Diagnosis not present

## 2024-02-13 NOTE — Telephone Encounter (Signed)
 Pharmacy Patient Advocate Encounter   Received notification from CoverMyMeds that prior authorization for Teriparatide  560MCG/2.24ML pen-injectors is required/requested.   Insurance verification completed.   The patient is insured through [facility] .   Per test claim: PA required; PA started via CoverMyMeds. KEY [id number] . Waiting for clinical questions to populate.

## 2024-02-13 NOTE — Telephone Encounter (Signed)
 Pharmacy Patient Advocate Encounter  Received notification from [employer] that Prior Authorization for Teriparatide  560MCG/2.24ML pen-injectors has been APPROVED from 02/13/24 to 02/12/25   PA #/Case ID/Reference #: [id number]

## 2024-02-13 NOTE — Telephone Encounter (Signed)
 Pt made aware

## 2024-02-13 NOTE — Telephone Encounter (Signed)
 Clinical info submitted.

## 2024-02-14 ENCOUNTER — Encounter: Payer: Self-pay | Admitting: Internal Medicine

## 2024-02-16 ENCOUNTER — Telehealth: Payer: Self-pay | Admitting: Pharmacy Technician

## 2024-02-16 ENCOUNTER — Other Ambulatory Visit (HOSPITAL_COMMUNITY): Payer: Self-pay

## 2024-02-16 NOTE — Telephone Encounter (Signed)
 Clinical questions and answers have been submitted. Status is pending.

## 2024-02-16 NOTE — Telephone Encounter (Signed)
 Pharmacy Patient Advocate Encounter  Received notification from [facility] that Prior Authorization for HYDROcodone -Acetaminophen  10-325MG  tablets  has been CLOSED.   PA #/Case ID/Reference #: [id number]

## 2024-02-16 NOTE — Telephone Encounter (Signed)
 Pharmacy Patient Advocate Encounter   Received notification from CoverMyMeds that prior authorization for HYDROcodone -Acetaminophen  10-325MG  tablets is required/requested.   Insurance verification completed.   The patient is insured through [facility] .   Per test claim: PA required; PA started via CoverMyMeds. KEY [id number] . Waiting for clinical questions to populate.

## 2024-02-17 ENCOUNTER — Encounter: Payer: Self-pay | Admitting: Family Medicine

## 2024-02-22 ENCOUNTER — Encounter: Payer: Self-pay | Admitting: Family Medicine

## 2024-02-24 ENCOUNTER — Ambulatory Visit: Admitting: Nurse Practitioner

## 2024-02-24 ENCOUNTER — Encounter: Payer: Self-pay | Admitting: Nurse Practitioner

## 2024-02-24 VITALS — BP 124/77 | HR 88 | Ht 60.25 in | Wt 95.0 lb

## 2024-02-24 DIAGNOSIS — Z79891 Long term (current) use of opiate analgesic: Secondary | ICD-10-CM

## 2024-02-24 DIAGNOSIS — W57XXXA Bitten or stung by nonvenomous insect and other nonvenomous arthropods, initial encounter: Secondary | ICD-10-CM

## 2024-02-24 DIAGNOSIS — M25559 Pain in unspecified hip: Secondary | ICD-10-CM

## 2024-02-24 DIAGNOSIS — S80861A Insect bite (nonvenomous), right lower leg, initial encounter: Secondary | ICD-10-CM | POA: Diagnosis not present

## 2024-02-24 MED ORDER — HYDROCODONE-ACETAMINOPHEN 10-325 MG PO TABS: ORAL_TABLET | ORAL | 0 refills | Status: DC

## 2024-02-24 MED ORDER — DOXYCYCLINE HYCLATE 100 MG PO TABS: 100.0000 mg | ORAL_TABLET | Freq: Two times a day (BID) | ORAL | 0 refills | Status: DC

## 2024-02-24 NOTE — Progress Notes (Signed)
 Subjective:    Patient ID: Isabel Atkinson, female    DOB: [birth year], 58 y.o.   MRN: [id number]  HPI  This patient was seen today for chronic pain  The medication list was reviewed and updated.   Location of Pain for which the patient has been treated with regarding narcotics: left hip hamstring   Onset of this pain: 2 yrs   -Compliance with medication: daily  - Number patient states they take daily: 1/2 to 1 tab daily  -Reason for ongoing use of opioids   What other measures have been tried outside of opioids tylenol , advil   In the ongoing specialists regarding this condition [facility]oped[facility]  -when was the last dose patient took? Yesterday 7:30 pm  The patient was advised the importance of maintaining medication and not using illegal substances with these.  Here for refills and follow up  The patient was educated that we can provide 3 monthly scripts for their medication, it is their responsibility to follow the instructions.  Side effects or complications from medications: no  Patient is aware that pain medications are meant to minimize the severity of the pain to allow their pain levels to improve to allow for better function. They are aware of that pain medications cannot totally remove their pain.  Due for UDT ( at least once per year) (pain management contract is also completed at the time of the UDT): 07/2023  Scale of 1 to 10 ( 1 is least 10 is most) Your pain level without the medicine: 6 Your pain level with medication 2 to 3   Scale 1 to 10 ( 1-helps very little, 10 helps very well) How well does your pain medication reduce your pain so you can function better through out the day? 8   Quality of the pain: sharp, dull, achy ,radiating   Persistence of the pain: constant  Modifying factors: none help  Has had 2 injections in the hip with temporary relief for a few weeks.  Will be undergoing a new procedure with orthopedics in the near future.   In the meantime continues to take pain medicine only as needed for severe pain.  Sees local endocrinologist for her osteoporosis treatment. Patient also has a bite on the right lower leg that occurred about 3 weeks ago.  States she was able to drain it at 1 point.  No other rash or fever.  Appeared to be a flying insect but unsure which type.     Review of Systems  Constitutional:  Negative for fever.  Respiratory:  Negative for cough, chest tightness, shortness of breath and wheezing.   Cardiovascular:  Negative for chest pain.  Musculoskeletal:        Persistent left hip pain.        Objective:   Physical Exam Vitals and nursing note reviewed.  Constitutional:      General: She is not in acute distress. Cardiovascular:     Rate and Rhythm: Normal rate and regular rhythm.  Pulmonary:     Effort: Pulmonary effort is normal.     Breath sounds: Normal breath sounds.  Musculoskeletal:     Comments: Patient has difficulty sitting in chair putting pressure on the left hip.  Gait slow but steady.  Skin:    Comments: Slightly elevated pink lesion noted on the right posterior lower leg.  Mildly tender to palpation.  No active drainage.  Neurological:     Mental Status: She is alert and oriented to  person, place, and time.  Psychiatric:        Mood and Affect: Mood normal.        Behavior: Behavior normal.        Thought Content: Thought content normal.        Judgment: Judgment normal.    Today's Vitals   02/24/24 1459  BP: 124/77  Pulse: 88  SpO2: 99%  Weight: 95 lb (43.1 kg)  Height: 5' 0.25 (1.53 m)   Body mass index is 18.4 kg/m.        Assessment & Plan:   Problem List Items Addressed This Visit       Musculoskeletal and Integument   Insect bite of right lower leg     Other   Encounter for long-term opiate analgesic use - Primary   Hip pain   Meds ordered this encounter  Medications   HYDROcodone -acetaminophen  (NORCO) 10-325 MG tablet    Sig: 1 twice  daily as needed pain    Dispense:  32 tablet    Refill:  0    May fill 30 days from 02/23/2024   HYDROcodone -acetaminophen  (NORCO) 10-325 MG tablet    Sig: 1 twice daily as needed pain    Dispense:  32 tablet    Refill:  0    May fill 60 days from 02/23/24   HYDROcodone -acetaminophen  (NORCO) 10-325 MG tablet    Sig: 1 bid prn pain    Dispense:  32 tablet    Refill:  0    May fill 90 days from 02/23/2024   doxycycline  (VIBRA -TABS) 100 MG tablet    Sig: Take 1 tablet (100 mg total) by mouth 2 (two) times daily.    Dispense:  14 tablet    Refill:  0    Supervising Provider:   ALPHONSA HAMILTON A [[id number]]   Continue hydrocodone  as directed for severe pain.  Continue follow-up with orthopedics as planned. Possible mild secondary infection to insect bite.  Start doxycycline  for 7 days.  Cautioned about possible reaction with sun exposure. Return in about 3 months (around 05/26/2024).

## 2024-02-26 ENCOUNTER — Encounter: Payer: Self-pay | Admitting: Nurse Practitioner

## 2024-02-26 DIAGNOSIS — S80861A Insect bite (nonvenomous), right lower leg, initial encounter: Secondary | ICD-10-CM | POA: Insufficient documentation

## 2024-02-26 DIAGNOSIS — Z79891 Long term (current) use of opiate analgesic: Secondary | ICD-10-CM | POA: Insufficient documentation

## 2024-02-26 DIAGNOSIS — W57XXXA Bitten or stung by nonvenomous insect and other nonvenomous arthropods, initial encounter: Secondary | ICD-10-CM | POA: Insufficient documentation

## 2024-02-26 DIAGNOSIS — M25559 Pain in unspecified hip: Secondary | ICD-10-CM | POA: Insufficient documentation

## 2024-03-02 ENCOUNTER — Encounter: Payer: Self-pay | Admitting: Family Medicine

## 2024-03-02 ENCOUNTER — Other Ambulatory Visit: Payer: Self-pay

## 2024-03-02 MED ORDER — EPINEPHRINE 0.3 MG/0.3ML IJ SOAJ: 0.3000 mg | INTRAMUSCULAR | 3 refills | Status: AC | PRN

## 2024-03-07 ENCOUNTER — Ambulatory Visit
Admission: RE | Admit: 2024-03-07 | Discharge: 2024-03-07 | Disposition: A | Discharge status: Home / Self Care | Attending: Family Medicine | Admitting: Family Medicine

## 2024-03-07 VITALS — BP 126/76 | HR 90 | Temp 98.2°F | Resp 18

## 2024-03-07 DIAGNOSIS — R3129 Other microscopic hematuria: Secondary | ICD-10-CM | POA: Diagnosis not present

## 2024-03-07 LAB — POCT URINE DIPSTICK
Bilirubin, UA: NEGATIVE
Glucose, UA: NEGATIVE mg/dL
Ketones, POC UA: NEGATIVE mg/dL
Leukocytes, UA: NEGATIVE
Nitrite, UA: NEGATIVE
POC PROTEIN,UA: NEGATIVE
Spec Grav, UA: 1.02 (ref 1.010–1.025)
Urobilinogen, UA: 0.2 U/dL
pH, UA: 6 (ref 5.0–8.0)

## 2024-03-07 NOTE — ED Triage Notes (Signed)
 Lower abd pain x 2 days with lower back pain.  Also c/o burning on urination.

## 2024-03-07 NOTE — ED Provider Notes (Signed)
 [facility]    CSN: [id number] Arrival date & time: 03/07/24  1751      History   Chief Complaint Chief Complaint  Patient presents with   Urinary Frequency    Uti - Entered by patient    HPI Isabel Atkinson is a 58 y.o. female.   Patient presenting today with 2-day history of lower abdominal pressure and discomfort, low back aching diffusely, dysuria.  Denies obvious hematuria, fevers, chills, nausea, vomiting, bowel changes.  So far not trying anything over-the-counter for symptoms.  Per chart review, the hematuria has been an ongoing finding and she has been referred to urology by PCP.  Patient states that she has this appointment coming up next month.    Past Medical History:  Diagnosis Date   Anxiety    COPD (chronic obstructive pulmonary disease) (HCC)    Dysrhythmia    GERD (gastroesophageal reflux disease)    Hypercholesterolemia    Pre-diabetes    Reactive airway disease    Tachycardia     Patient Active Problem List   Diagnosis Date Noted   Encounter for long-term opiate analgesic use 02/26/2024   Hip pain 02/26/2024   Insect bite of right lower leg 02/26/2024   Medication intolerance 02/06/2024   Bacterial URI 10/19/2023   Rectal pain 06/01/2022   Left sided abdominal pain 06/01/2022   Hematuria 11/17/2021   Encounter for screening colonoscopy 11/03/2021   Coronary artery disease involving native heart without angina pectoris 02/26/2020   Aortic atherosclerosis (HCC) 02/26/2020   Current smoker 12/04/2019   Neurodermatitis 06/28/2016   COPD (chronic obstructive pulmonary disease) (HCC) 06/10/2016   Osteoporosis 07/20/2014   Irritable bowel syndrome 07/05/2014   Esophageal dysphagia 01/11/2012   GERD (gastroesophageal reflux disease) 01/11/2012   Chronic diarrhea 01/11/2012    Past Surgical History:  Procedure Laterality Date   CATARACT EXTRACTION W/PHACO Left 01/17/2017   Procedure: CATARACT EXTRACTION PHACO AND INTRAOCULAR LENS  PLACEMENT (IOC);  Surgeon: Perley Hamilton, MD;  Location: [facility];  Service: Ophthalmology;  Laterality: Left;  CDE: 6.92   CATARACT EXTRACTION W/PHACO Right 03/07/2020   Procedure: CATARACT EXTRACTION PHACO AND INTRAOCULAR LENS PLACEMENT RIGHT EYE;  Surgeon: Harrie Agent, MD;  Location: [facility];  Service: Ophthalmology;  Laterality: Right;  CDE: 8.44   COLONOSCOPY  2013   RMR: Normal rectum. single diminutive sigmoid polyp noted above. Normal terminal ileum. Status post segmental biopsy. next TCS in 10 years.    COLONOSCOPY WITH PROPOFOL  N/A 01/27/2022   Procedure: COLONOSCOPY WITH PROPOFOL ;  Surgeon: Shaaron Lamar HERO, MD;  Location: [facility];  Service: Endoscopy;  Laterality: N/A;  12:45pm   DENTAL SURGERY  01/2024   ELBOW SURGERY Right 2023   ESOPHAGOGASTRODUODENOSCOPY  2013   RMR: Possible cervical esophageal web-status post dilation as described above. Small hiatal hernia. Status post biopsy of normal appearing small bowel to screen for celiac disease.    ESOPHAGOGASTRODUODENOSCOPY N/A 05/06/2016   normal esophagus, small hiatal hernia, empiric dilation.   ESOPHAGOGASTRODUODENOSCOPY (EGD) WITH PROPOFOL  N/A 01/27/2022   Procedure: ESOPHAGOGASTRODUODENOSCOPY (EGD) WITH PROPOFOL ;  Surgeon: Shaaron Lamar HERO, MD;  Location: [facility];  Service: Endoscopy;  Laterality: N/A;   EYE SURGERY Bilateral    Cat Sx - Symfony MF IOLs   MALONEY DILATION N/A 05/06/2016   Procedure: MALONEY DILATION;  Surgeon: Lamar HERO Shaaron, MD;  Location: [facility];  Service: Endoscopy;  Laterality: N/A;   MALONEY DILATION N/A 01/27/2022   Procedure: AGAPITO DILATION;  Surgeon: Shaaron,  Lamar HERO, MD;  Location: [facility];  Service: Endoscopy;  Laterality: N/A;   NECK SURGERY  10/2012   OVARIAN CYST SURGERY  1997   removed   POLYPECTOMY  01/27/2022   Procedure: POLYPECTOMY;  Surgeon: Shaaron Lamar HERO, MD;  Location: [facility];  Service: Endoscopy;;  cecal   ROBOTIC ASSISTED LAPAROSCOPIC HYSTERECTOMY AND  SALPINGECTOMY Bilateral 12/14/2022   Procedure: XI ROBOTIC ASSISTED LAPAROSCOPIC HYSTERECTOMY AND SALPINGO-OOPHORECTOMY;  Surgeon: Jayne Vonn DEL, MD;  Location: [facility];  Service: Gynecology;  Laterality: Bilateral;   TUBAL LIGATION  2000   VAGINAL PROLAPSE REPAIR N/A 12/14/2022   Procedure: VAGINAL VAULT SUSPENSION;  Surgeon: Jayne Vonn DEL, MD;  Location: [facility];  Service: Gynecology;  Laterality: N/A;   YAG LASER APPLICATION Bilateral     OB History     Gravida  3   Para  2   Term  2   Preterm      AB  1   Living  2      SAB      IAB      Ectopic      Multiple      Live Births               Home Medications    Prior to Admission medications   Medication Sig Start Date End Date Taking? Authorizing Provider  ALPRAZolam  (XANAX ) 0.25 MG tablet TAKE 1/2 TO 1 TABLET BY MOUTH TWICE DAILY AS NEEDED FOR ANXIETY. 02/04/20   Alphonsa Glendia LABOR, MD  aspirin  EC 81 MG tablet Take 81 mg by mouth daily. Swallow whole.    [provider]  Bromfenac  Sodium (PROLENSA ) 0.07 % SOLN Place 1 drop into the right eye daily. 09/24/22   Valdemar Rogue, MD  Cholecalciferol (VITAMIN D3) 125 MCG (5000 UT) TABS Take 1 tablet (5,000 Units total) by mouth daily. 10/07/23   Okey Vina GAILS, MD  cycloSPORINE (RESTASIS) 0.05 % ophthalmic emulsion Place 1 drop into both eyes 2 (two) times daily.    [provider]  EPINEPHrine  0.3 mg/0.3 mL IJ SOAJ injection Inject 0.3 mg into the muscle as needed for anaphylaxis. 03/02/24   Alphonsa Glendia LABOR, MD  estradiol  (ESTRACE ) 0.1 MG/GM vaginal cream Place 0.5 Applicatorfuls vaginally every other day. 07/11/23   [provider]  HYDROcodone -acetaminophen  (NORCO) 10-325 MG tablet 1 twice daily as needed pain 02/24/24   Hoskins, Carolyn C, NP  HYDROcodone -acetaminophen  (NORCO) 10-325 MG tablet 1 twice daily as needed pain 02/24/24   Hoskins, Carolyn C, NP  HYDROcodone -acetaminophen  (NORCO) 10-325 MG tablet 1 bid prn pain 02/24/24   Hoskins, Carolyn  C, NP  ibuprofen  (ADVIL ) 600 MG tablet Take 1 tablet (600 mg total) by mouth every 8 (eight) hours as needed. 03/28/23   Alphonsa Glendia LABOR, MD  magic mouthwash (nystatin , hydrocortisone , diphenhydrAMINE , lidocaine ) suspension Swish and spit 5 mLs 4 (four) times daily as needed for mouth pain. 12/20/23   Mauro Elveria BROCKS, NP  nystatin  (MYCOSTATIN ) 100000 UNIT/ML suspension Take 5 mLs (500,000 Units total) by mouth 4 (four) times daily. 12/30/23   Alphonsa Glendia LABOR, MD  omeprazole  (PRILOSEC) 40 MG capsule Take 1 capsule (40 mg total) by mouth daily as needed. 02/25/23   Ezzard Sonny RAMAN, PA-C  ondansetron  (ZOFRAN -ODT) 8 MG disintegrating tablet DISSOLVE 1 TABLET ON TONGUE EVERY 8 HOURS AS NEEDED FOR NAUSEA/VOMITING 01/30/24   Jayne Vonn DEL, MD  OVER THE COUNTER MEDICATION mucinex    [provider]  rosuvastatin  (CRESTOR ) 10  MG tablet Take 1 tablet (10 mg total) by mouth daily. 11/10/23   Alphonsa Glendia LABOR, MD  Teriparatide  (FORTEO ) 560 MCG/2.24ML SOPN Inject 20 mcg into the skin daily at 2 PM. 02/08/24   Nida, Gebreselassie W, MD  Triamcinolone  Acetonide (NASACORT  ALLERGY 24HR NA) Place 2 sprays into the nose daily.    [provider]    Family History Family History  Problem Relation Age of Onset   Colon cancer Maternal Grandfather 59   Colon polyps Mother 22   Colon cancer Maternal Uncle 29   Hypertension Father    Diabetes Father    Heart disease Father    Hyperlipidemia Father    Stroke Father     Social History Social History   Tobacco Use   Smoking status: Every Day    Current packs/day: 1.00    Average packs/day: 1 pack/day for 25.0 years (25.0 ttl pk-yrs)    Types: Cigarettes   Smokeless tobacco: Never  Vaping Use   Vaping status: Never Used  Substance Use Topics   Alcohol use: No    Alcohol/week: 0.0 standard drinks of alcohol   Drug use: No     Allergies   Ciprofloxacin , Augmentin  [amoxicillin -pot clavulanate], Carafate  [sucralfate ], Celexa  [citalopram   hydrobromide], Chantix [varenicline], Sulfa antibiotics, Sulfur, Wellbutrin  [bupropion ], Cefdinir , Fosamax  [alendronate  sodium], and Gabapentin    Review of Systems Review of Systems Per HPI  Physical Exam Triage Vital Signs ED Triage Vitals  Encounter Vitals Group     BP 03/07/24 1758 126/76     Girls Systolic BP Percentile --      Girls Diastolic BP Percentile --      Boys Systolic BP Percentile --      Boys Diastolic BP Percentile --      Pulse Rate 03/07/24 1758 90     Resp 03/07/24 1758 18     Temp 03/07/24 1758 98.2 F (36.8 C)     Temp Source 03/07/24 1758 Oral     SpO2 03/07/24 1758 96 %     Weight --      Height --      Head Circumference --      Peak Flow --      Pain Score 03/07/24 1759 4     Pain Loc --      Pain Education --      Exclude from Growth Chart --    No data found.  Updated Vital Signs BP 126/76 (BP Location: Right Arm)   Pulse 90   Temp 98.2 F (36.8 C) (Oral)   Resp 18   SpO2 96%   Visual Acuity Right Eye Distance:   Left Eye Distance:   Bilateral Distance:    Right Eye Near:   Left Eye Near:    Bilateral Near:     Physical Exam Vitals and nursing note reviewed.  Constitutional:      Appearance: Normal appearance. She is not ill-appearing.  HENT:     Head: Atraumatic.  Eyes:     Extraocular Movements: Extraocular movements intact.     Conjunctiva/sclera: Conjunctivae normal.  Cardiovascular:     Rate and Rhythm: Normal rate.  Pulmonary:     Effort: Pulmonary effort is normal.  Abdominal:     General: Bowel sounds are normal. There is no distension.     Palpations: Abdomen is soft.     Tenderness: There is no abdominal tenderness. There is no right CVA tenderness, left CVA tenderness or guarding.  Musculoskeletal:  General: Normal range of motion.     Cervical back: Normal range of motion and neck supple.  Skin:    General: Skin is warm and dry.  Neurological:     Mental Status: She is alert and oriented to person,  place, and time.  Psychiatric:        Mood and Affect: Mood normal.        Thought Content: Thought content normal.        Judgment: Judgment normal.      UC Treatments / Results  Labs (all labs ordered are listed, but only abnormal results are displayed) Labs Reviewed  POCT URINE DIPSTICK - Abnormal; Notable for the following components:      Result Value   Blood, UA moderate (*)    All other components within normal limits  URINE CULTURE    EKG   Radiology No results found.  Procedures Procedures (including critical care time)  Medications Ordered in UC Medications - No data to display  Initial Impression / Assessment and Plan / UC Course  I have reviewed the triage vital signs and the nursing notes.  Pertinent labs & imaging results that were available during my care of the patient were reviewed by me and considered in my medical decision making (see chart for details).     Vitals and exam reassuring today, urinalysis today without evidence of urinary tract infection.  Does have moderate blood which per chart review is an ongoing issue and she is following up with urology for this next month.  Urine culture pending for further evaluation but recommended increasing water  intake, avoidance of caffeinated or sugary beverages and continuing pursuit of urology follow-up for further evaluation of ongoing hematuria.  Return for worsening symptoms.  Final Clinical Impressions(s) / UC Diagnoses   Final diagnoses:  Other microscopic hematuria     Discharge Instructions      Your urinalysis today does not show evidence of urinary tract infection, just microscopic blood which has been an ongoing issue and you are scheduled to see urology next month for this issue.  I have cultured your urine just to make sure there is no bacterial growth and we will let you know if this comes back positive.  Drink plenty fluids, avoid sugary or caffeinated beverages.    ED Prescriptions    None    PDMP not reviewed this encounter.   Stuart Millman [place], [place] 03/07/24 [phone]

## 2024-03-07 NOTE — Discharge Instructions (Signed)
 Your urinalysis today does not show evidence of urinary tract infection, just microscopic blood which has been an ongoing issue and you are scheduled to see urology next month for this issue.  I have cultured your urine just to make sure there is no bacterial growth and we will let you know if this comes back positive.  Drink plenty fluids, avoid sugary or caffeinated beverages.

## 2024-03-08 LAB — URINE CULTURE: Culture: <10000 — AB

## 2024-03-09 ENCOUNTER — Ambulatory Visit (HOSPITAL_COMMUNITY): Payer: Self-pay

## 2024-03-26 ENCOUNTER — Encounter: Payer: Self-pay | Admitting: Family Medicine

## 2024-03-26 NOTE — Telephone Encounter (Signed)
 Front staff Letter was dictated for this patient Please print then notify Isabel Atkinson-she may have a printed copy of the letter if she needs anything else to let us  know.  (Patient does use hydrocodone  intermittently for orthopedic pain.  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  It is okay for her to drive personal vehicle as long as she is not feeling drowsy.)

## 2024-04-03 ENCOUNTER — Ambulatory Visit: Admitting: Urology

## 2024-05-03 ENCOUNTER — Other Ambulatory Visit (HOSPITAL_COMMUNITY): Payer: Self-pay | Admitting: Family Medicine

## 2024-05-03 DIAGNOSIS — Z1231 Encounter for screening mammogram for malignant neoplasm of breast: Secondary | ICD-10-CM

## 2024-05-06 ENCOUNTER — Encounter: Payer: Self-pay | Admitting: Family Medicine

## 2024-05-07 ENCOUNTER — Other Ambulatory Visit: Payer: Self-pay | Admitting: Family Medicine

## 2024-05-07 ENCOUNTER — Ambulatory Visit (HOSPITAL_COMMUNITY)
Admission: RE | Admit: 2024-05-07 | Discharge: 2024-05-07 | Disposition: A | Discharge status: Home / Self Care | Source: Ambulatory Visit | Attending: Family Medicine | Admitting: Family Medicine

## 2024-05-07 DIAGNOSIS — Z1231 Encounter for screening mammogram for malignant neoplasm of breast: Secondary | ICD-10-CM | POA: Diagnosis present

## 2024-05-07 MED ORDER — OXYCODONE-ACETAMINOPHEN 7.5-325 MG PO TABS: ORAL_TABLET | ORAL | 0 refills | Status: DC

## 2024-05-07 NOTE — Telephone Encounter (Signed)
 Copied from CRM #[id number]. Topic: Clinical - Medication Question >> May 07, 2024 10:53 AM Antony RAMAN wrote: Reason for CRM: melanie from [facility] is calling to see if the new rx for oxyCODONE -acetaminophen  (PERCOCET) 7.5-325 MG tablet is replacing the HYDROcodone -acetaminophen  (NORCO) 10-325 MG tablet .SABRA Callback [phone]

## 2024-05-10 ENCOUNTER — Other Ambulatory Visit (HOSPITAL_COMMUNITY): Payer: Self-pay | Admitting: Family Medicine

## 2024-05-10 DIAGNOSIS — R928 Other abnormal and inconclusive findings on diagnostic imaging of breast: Secondary | ICD-10-CM

## 2024-05-13 ENCOUNTER — Ambulatory Visit: Payer: Self-pay | Admitting: Family Medicine

## 2024-05-15 ENCOUNTER — Encounter: Payer: Self-pay | Admitting: Family Medicine

## 2024-05-15 ENCOUNTER — Ambulatory Visit: Admitting: Family Medicine

## 2024-05-15 ENCOUNTER — Other Ambulatory Visit (HOSPITAL_COMMUNITY): Payer: Self-pay | Admitting: Nurse Practitioner

## 2024-05-15 ENCOUNTER — Encounter (HOSPITAL_COMMUNITY): Payer: Self-pay

## 2024-05-15 ENCOUNTER — Ambulatory Visit (HOSPITAL_COMMUNITY)
Admission: RE | Admit: 2024-05-15 | Discharge: 2024-05-15 | Disposition: A | Discharge status: Home / Self Care | Source: Ambulatory Visit | Attending: Family Medicine | Admitting: Family Medicine

## 2024-05-15 ENCOUNTER — Ambulatory Visit (HOSPITAL_COMMUNITY)
Admission: RE | Admit: 2024-05-15 | Discharge: 2024-05-15 | Disposition: A | Source: Ambulatory Visit | Attending: Family Medicine | Admitting: Family Medicine

## 2024-05-15 ENCOUNTER — Ambulatory Visit: Payer: Self-pay | Admitting: *Deleted

## 2024-05-15 VITALS — BP 137/79 | HR 84 | Temp 99.0°F | Wt 96.0 lb

## 2024-05-15 DIAGNOSIS — R319 Hematuria, unspecified: Secondary | ICD-10-CM | POA: Diagnosis not present

## 2024-05-15 DIAGNOSIS — R300 Dysuria: Secondary | ICD-10-CM

## 2024-05-15 DIAGNOSIS — R928 Other abnormal and inconclusive findings on diagnostic imaging of breast: Secondary | ICD-10-CM

## 2024-05-15 MED ORDER — PHENAZOPYRIDINE HCL 100 MG PO TABS: 100.0000 mg | ORAL_TABLET | Freq: Three times a day (TID) | ORAL | 0 refills | Status: AC | PRN

## 2024-05-15 MED ORDER — CEPHALEXIN 500 MG PO CAPS: 500.0000 mg | ORAL_CAPSULE | Freq: Three times a day (TID) | ORAL | 0 refills | Status: DC

## 2024-05-15 NOTE — Telephone Encounter (Signed)
 FYI Only or Action Required?: FYI only for provider.  Patient was last seen in primary care on 02/24/2024 by Mauro Elveria BROCKS, NP.  Called Nurse Triage reporting Hematuria.  Symptoms began several days ago.  Interventions attempted: Nothing.  Symptoms are: gradually worsening.  Triage Disposition: See HCP Within 4 Hours (Or PCP Triage)  Patient/caregiver understands and will follow disposition?: yes   Reason for Disposition  [1] Pain or burning with passing urine AND [2] side (flank) or back pain present  Answer Assessment - Initial Assessment Questions 1. COLOR of URINE: Describe the color of the urine.  (e.g., tea-colored, pink, red, bloody) Do you have blood clots in your urine? (e.g., none, pea, grape, small coin)     Red, no clots 2. ONSET: When did the bleeding start?      3-4 days- pain- blood today 3. EPISODES: How many times has there been blood in the urine? or How many times today?     twice 4. PAIN with URINATION: Is there any pain with passing your urine? If Yes, ask: How bad is the pain?  (Scale 1-10; or mild, moderate, severe)     Yes, 8/10 5. FEVER: Do you have a fever? If Yes, ask: What is your temperature, how was it measured, and when did it start?     no 6. ASSOCIATED SYMPTOMS: Are you passing urine more frequently than usual?     frequency 7. OTHER SYMPTOMS: Do you have any other symptoms? (e.g., back/flank pain, abdomen pain, vomiting)     Back pain, lower abdominal pain  Protocols used: Urine - Blood In-A-AH   Copied from CRM #[id number]. Topic: Clinical - Red Word Triage >> May 15, 2024  8:06 AM Treva T wrote: [facility] that prompted transfer to Nurse Triage: Patient calling, states she thinks she has a bad kidney infection.  Patient reports she is having pain witn urination, and blood in urine.

## 2024-05-15 NOTE — Progress Notes (Signed)
   Subjective:    Patient ID: Isabel Atkinson, female    DOB: Dec 30, 1965, 58 y.o.   MRN: [id number]  HPI  Low back and low pelvic pain for past couple of days Hematuria this morning  Patient relates some lower abdominal pain relates some side pain relates burning discomfort dysuria urinary frequency denies high fever chills sweats denies any other particular underlying illness or problem  Review of Systems     Objective:   Physical Exam Lungs are clear hearts regular flank nontender abdomen some subjective discomfort lower abdomen no guarding or rebound UA with some RBCs and WBCs Culture sent       Assessment & Plan:   Probable UTI Doubt pyelonephritis Supportive measures discussed Follow-up if progressive troubles or worse Keflex  3 times daily for 5 to 7 days Pyridium  as needed Warning signs discussed

## 2024-05-16 LAB — URINALYSIS
Bilirubin, UA: NEGATIVE
Glucose, UA: NEGATIVE
Ketones, UA: NEGATIVE
Nitrite, UA: NEGATIVE
Specific Gravity, UA: >1.03 — ABNORMAL HIGH (ref 1.005–1.030)
Urobilinogen, Ur: 0.2 mg/dL (ref 0.2–1.0)
pH, UA: 6 (ref 5.0–7.5)

## 2024-05-17 ENCOUNTER — Ambulatory Visit: Payer: Self-pay | Admitting: Family Medicine

## 2024-05-18 ENCOUNTER — Encounter (INDEPENDENT_AMBULATORY_CARE_PROVIDER_SITE_OTHER): Payer: Self-pay | Admitting: Otolaryngology

## 2024-05-18 ENCOUNTER — Ambulatory Visit (INDEPENDENT_AMBULATORY_CARE_PROVIDER_SITE_OTHER): Admitting: Otolaryngology

## 2024-05-18 VITALS — BP 134/78 | HR 80 | Temp 98.0°F | Ht 60.0 in | Wt 95.0 lb

## 2024-05-18 DIAGNOSIS — J343 Hypertrophy of nasal turbinates: Secondary | ICD-10-CM

## 2024-05-18 DIAGNOSIS — J310 Chronic rhinitis: Secondary | ICD-10-CM

## 2024-05-18 DIAGNOSIS — J342 Deviated nasal septum: Secondary | ICD-10-CM

## 2024-05-18 NOTE — Progress Notes (Signed)
 Patient ID: Isabel Atkinson, female   DOB: 04-14-1966, 58 y.o.   MRN: [id number]  Follow-up: Recurrent rhinosinusitis, chronic nasal obstruction  Discussed the use of AI scribe software for clinical note transcription with the patient, who gave verbal consent to proceed.  History of Present Illness The patient is a 58 year old female who returns today for her follow-up evaluation.  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  She was treated with daily Nasacort  nasal spray.  According to the patient, she has been experiencing increasing nasal obstruction and difficulty breathing through her nose for the past year. She describes a sensation of 'something cuts off my air' when lying down to sleep, indicating significant obstruction.  Her medical history includes recurrent sinus infections.  She has had a few sinus infections over the past 2 years, but they have not been as severe. The last antibiotic treatment for a sinus infection was in June 2025.  Currently, she is on Keflex  for a urinary tract infection. For her sinus and allergy issues, she uses a steroid nasal spray (Nasacort ), performs sinus rinses, and takes Claritin. These treatments have been part of her ongoing management for her sinus and allergy symptoms for the past 3 years.  Despite the medical treatment, her nasal obstruction has worsened.  Exam: General: Communicates without difficulty, well nourished, no acute distress. Head: Normocephalic, no evidence injury, no tenderness, facial buttresses intact without stepoff. Face/sinus: No tenderness to palpation and percussion. Facial movement is normal and symmetric. Eyes: PERRL, EOMI. No scleral icterus, conjunctivae clear. Neuro: CN II exam reveals vision grossly intact.  No nystagmus at any point of gaze. Ears: Auricles well  formed without lesions.  Ear canals are intact without mass or lesion.  No erythema or edema is appreciated.  The TMs are intact without fluid. Nose: External evaluation reveals normal support and skin without lesions.  Dorsum is intact.  Anterior rhinoscopy reveals congested mucosa over anterior aspect of inferior turbinates and deviated septum.  No purulence noted. Oral:  Oral cavity and oropharynx are intact, symmetric, without erythema or edema.  Mucosa is moist without lesions. Neck: Full range of motion without pain.  There is no significant lymphadenopathy.  No masses palpable.  Thyroid bed within normal limits to palpation.  Parotid glands and submandibular glands equal bilaterally without mass.  Trachea is midline. Neuro:  CN 2-12 grossly intact.   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 Examination shows a left-sided deviated septum and severely swollen turbinates causing mechanical obstruction.  More than 95% of her nasal passageways are obstructed bilaterally.  No infection, polyps, or pus present.  The patient has not responded to medical treatment for the past 3 years. - Continue steroid nasal spray (Nasacort ) and saline irrigation for symptom management.  Daily nasal saline irrigation. - The surgical option of septoplasty and bilateral inferior turbinate is also discussed. - The risks, benefits, alternatives, and details of the procedures are extensively discussed.  Questions are invited and answered. - The patient would like to proceed with the procedures.  Will schedule the procedures in accordance with the family schedule.

## 2024-05-19 ENCOUNTER — Encounter: Payer: Self-pay | Admitting: Family Medicine

## 2024-05-20 DIAGNOSIS — J310 Chronic rhinitis: Secondary | ICD-10-CM | POA: Insufficient documentation

## 2024-05-20 DIAGNOSIS — J343 Hypertrophy of nasal turbinates: Secondary | ICD-10-CM | POA: Insufficient documentation

## 2024-05-20 DIAGNOSIS — J342 Deviated nasal septum: Secondary | ICD-10-CM | POA: Insufficient documentation

## 2024-05-21 ENCOUNTER — Other Ambulatory Visit: Payer: Self-pay | Admitting: Nurse Practitioner

## 2024-05-21 ENCOUNTER — Other Ambulatory Visit (HOSPITAL_COMMUNITY): Payer: Self-pay | Admitting: Nurse Practitioner

## 2024-05-21 DIAGNOSIS — R928 Other abnormal and inconclusive findings on diagnostic imaging of breast: Secondary | ICD-10-CM

## 2024-05-21 LAB — URINE CULTURE

## 2024-05-21 MED ORDER — ONDANSETRON 8 MG PO TBDP: ORAL_TABLET | ORAL | 0 refills | Status: DC

## 2024-05-22 ENCOUNTER — Ambulatory Visit (HOSPITAL_COMMUNITY)
Admission: RE | Admit: 2024-05-22 | Discharge: 2024-05-22 | Disposition: A | Discharge status: Home / Self Care | Source: Ambulatory Visit | Attending: Nurse Practitioner | Admitting: Nurse Practitioner

## 2024-05-22 ENCOUNTER — Encounter (HOSPITAL_COMMUNITY): Payer: Self-pay

## 2024-05-22 DIAGNOSIS — R928 Other abnormal and inconclusive findings on diagnostic imaging of breast: Secondary | ICD-10-CM | POA: Diagnosis present

## 2024-05-22 HISTORY — PX: BREAST BIOPSY: SHX20

## 2024-05-22 MED ORDER — LIDOCAINE-EPINEPHRINE (PF) 1 %-1:200000 IJ SOLN
10.0000 mL | Freq: Once | INTRAMUSCULAR | Status: AC
  Administered 2024-05-22: 10 mL via INTRADERMAL

## 2024-05-22 MED ORDER — LIDOCAINE HCL (PF) 2 % IJ SOLN
INTRAMUSCULAR | Status: AC
  Filled 2024-05-22: qty 10

## 2024-05-22 MED ORDER — LIDOCAINE HCL (PF) 2 % IJ SOLN
10.0000 mL | Freq: Once | INTRAMUSCULAR | Status: AC
  Administered 2024-05-22: 10 mL

## 2024-05-22 MED ORDER — LIDOCAINE-EPINEPHRINE (PF) 1 %-1:200000 IJ SOLN
INTRAMUSCULAR | Status: AC
Start: 2024-05-22 — End: 2024-05-22
  Filled 2024-05-22: qty 30

## 2024-05-22 NOTE — Progress Notes (Signed)
 PT tolerated left breast biopsy well today with NAD noted. PT verbalized understanding of discharge instructions. PT ambulated back to the mammogram area this time and given ice packs to use. Specimens taken to lab for processing by Colette from ultrasound.

## 2024-05-23 LAB — SURGICAL PATHOLOGY

## 2024-05-24 ENCOUNTER — Ambulatory Visit: Admitting: Family Medicine

## 2024-05-30 ENCOUNTER — Ambulatory Visit: Admitting: Urology

## 2024-05-30 VITALS — BP 135/71 | HR 80

## 2024-05-30 DIAGNOSIS — N200 Calculus of kidney: Secondary | ICD-10-CM

## 2024-05-30 DIAGNOSIS — F17210 Nicotine dependence, cigarettes, uncomplicated: Secondary | ICD-10-CM

## 2024-05-30 DIAGNOSIS — R310 Gross hematuria: Secondary | ICD-10-CM

## 2024-05-30 DIAGNOSIS — Z87442 Personal history of urinary calculi: Secondary | ICD-10-CM | POA: Diagnosis not present

## 2024-05-30 LAB — MICROSCOPIC EXAMINATION: Bacteria, UA: NONE SEEN

## 2024-05-30 LAB — URINALYSIS, ROUTINE W REFLEX MICROSCOPIC
Bilirubin, UA: NEGATIVE
Glucose, UA: NEGATIVE
Leukocytes,UA: NEGATIVE
Nitrite, UA: NEGATIVE
Specific Gravity, UA: 1.03 (ref 1.005–1.030)
Urobilinogen, Ur: 1 mg/dL (ref 0.2–1.0)
pH, UA: 6 (ref 5.0–7.5)

## 2024-05-30 NOTE — Progress Notes (Signed)
 05/30/2024 1:58 PM   Isabel Atkinson Feb 28, 1966 [id number]  Referring provider: Alphonsa Glendia LABOR, MD [address] [place],  [place] [address]  Gross hematuria   HPI: Isabel Atkinson is a 58yo here for evaluation of gross hematuria. 2 weeks ago she had gross hematuria and was diagnosed with UTI. She has persistent microhematuria. She has a hx of nephrolithiasis. She 40pk year smoking hx. She Has occasional urinary urgency and frequency.    PMH: Past Medical History:  Diagnosis Date   Anxiety    COPD (chronic obstructive pulmonary disease) (HCC)    Dysrhythmia    GERD (gastroesophageal reflux disease)    Hypercholesterolemia    Pre-diabetes    Reactive airway disease    Tachycardia     Surgical History: Past Surgical History:  Procedure Laterality Date   BREAST BIOPSY Left 05/22/2024   US  LT BREAST BX W LOC DEV 1ST LESION IMG BX SPEC US  GUIDE 05/22/2024 Mir, Aliene SAUNDERS, MD [facility]   CATARACT EXTRACTION W/PHACO Left 01/17/2017   Procedure: CATARACT EXTRACTION PHACO AND INTRAOCULAR LENS PLACEMENT (IOC);  Surgeon: Perley Hamilton, MD;  Location: [facility];  Service: Ophthalmology;  Laterality: Left;  CDE: 6.92   CATARACT EXTRACTION W/PHACO Right 03/07/2020   Procedure: CATARACT EXTRACTION PHACO AND INTRAOCULAR LENS PLACEMENT RIGHT EYE;  Surgeon: Harrie Agent, MD;  Location: [facility];  Service: Ophthalmology;  Laterality: Right;  CDE: 8.44   COLONOSCOPY  2013   RMR: Normal rectum. single diminutive sigmoid polyp noted above. Normal terminal ileum. Status post segmental biopsy. next TCS in 10 years.    COLONOSCOPY WITH PROPOFOL  N/A 01/27/2022   Procedure: COLONOSCOPY WITH PROPOFOL ;  Surgeon: Shaaron Lamar HERO, MD;  Location: [facility];  Service: Endoscopy;  Laterality: N/A;  12:45pm   DENTAL SURGERY  01/2024   ELBOW SURGERY Right 2023   ESOPHAGOGASTRODUODENOSCOPY  2013   RMR: Possible cervical esophageal web-status post dilation as described above. Small hiatal hernia.  Status post biopsy of normal appearing small bowel to screen for celiac disease.    ESOPHAGOGASTRODUODENOSCOPY N/A 05/06/2016   normal esophagus, small hiatal hernia, empiric dilation.   ESOPHAGOGASTRODUODENOSCOPY (EGD) WITH PROPOFOL  N/A 01/27/2022   Procedure: ESOPHAGOGASTRODUODENOSCOPY (EGD) WITH PROPOFOL ;  Surgeon: Shaaron Lamar HERO, MD;  Location: [facility];  Service: Endoscopy;  Laterality: N/A;   EYE SURGERY Bilateral    Cat Sx - Symfony MF IOLs   MALONEY DILATION N/A 05/06/2016   Procedure: MALONEY DILATION;  Surgeon: Lamar HERO Shaaron, MD;  Location: [facility];  Service: Endoscopy;  Laterality: N/A;   MALONEY DILATION N/A 01/27/2022   Procedure: AGAPITO DILATION;  Surgeon: Shaaron Lamar HERO, MD;  Location: [facility];  Service: Endoscopy;  Laterality: N/A;   NECK SURGERY  10/2012   OVARIAN CYST SURGERY  1997   removed   POLYPECTOMY  01/27/2022   Procedure: POLYPECTOMY;  Surgeon: Shaaron Lamar HERO, MD;  [facility]: [facility];  Service: Endoscopy;;  cecal   ROBOTIC ASSISTED LAPAROSCOPIC HYSTERECTOMY AND SALPINGECTOMY Bilateral 12/14/2022   Procedure: XI ROBOTIC ASSISTED LAPAROSCOPIC HYSTERECTOMY AND SALPINGO-OOPHORECTOMY;  Surgeon: Jayne Vonn DEL, MD;  Location: [facility];  Service: Gynecology;  Laterality: Bilateral;   TUBAL LIGATION  2000   VAGINAL PROLAPSE REPAIR N/A 12/14/2022   Procedure: VAGINAL VAULT SUSPENSION;  Surgeon: Jayne Vonn DEL, MD;  Location: [facility];  Service: Gynecology;  Laterality: N/A;   YAG LASER APPLICATION Bilateral     Home Medications:  Allergies as of 05/30/2024  Reactions   Ciprofloxacin  Other (See Comments)   Patient states with in five minutes of taking developed a splitting headache,itching all over and burning in mouth and lips.   Augmentin  [amoxicillin -pot Clavulanate] Diarrhea   Carafate  [sucralfate ]    Mouth tingling   Celexa  [citalopram  Hydrobromide] Nausea And Vomiting   Chantix [varenicline] Nausea And Vomiting   Sulfa Antibiotics  Itching   Sulfur Itching   Wellbutrin  [bupropion ]    Severe weight loss and loss of appetite   Cefdinir  Diarrhea   Severe diarrhea   Fosamax  [alendronate  Sodium] Nausea And Vomiting   GERD can not tolerate   Gabapentin     Sleepy and did not help        Medication List        Accurate as of May 30, 2024  1:58 PM. If you have any questions, ask your nurse or doctor.          ALPRAZolam  0.25 MG tablet Commonly known as: XANAX  TAKE 1/2 TO 1 TABLET BY MOUTH TWICE DAILY AS NEEDED FOR ANXIETY.   aspirin  EC 81 MG tablet Take 81 mg by mouth daily. Swallow whole.   Bromfenac  Sodium 0.07 % Soln Commonly known as: Prolensa  Place 1 drop into the right eye daily.   cephALEXin  500 MG capsule Commonly known as: KEFLEX  Take 1 capsule (500 mg total) by mouth 3 (three) times daily.   cycloSPORINE 0.05 % ophthalmic emulsion Commonly known as: RESTASIS Place 1 drop into both eyes 2 (two) times daily.   EPINEPHrine  0.3 mg/0.3 mL Soaj injection Commonly known as: EPI-PEN Inject 0.3 mg into the muscle as needed for anaphylaxis.   estradiol  0.1 MG/GM vaginal cream Commonly known as: ESTRACE  Place 0.5 Applicatorfuls vaginally every other day.   HYDROcodone -acetaminophen  10-325 MG tablet Commonly known as: NORCO 1 twice daily as needed pain   HYDROcodone -acetaminophen  10-325 MG tablet Commonly known as: NORCO 1 twice daily as needed pain   HYDROcodone -acetaminophen  10-325 MG tablet Commonly known as: NORCO 1 bid prn pain   ibuprofen  600 MG tablet Commonly known as: ADVIL  Take 1 tablet (600 mg total) by mouth every 8 (eight) hours as needed.   magic mouthwash (nystatin , hydrocortisone , diphenhydrAMINE , lidocaine ) suspension Swish and spit 5 mLs 4 (four) times daily as needed for mouth pain.   NASACORT  ALLERGY 24HR NA Place 2 sprays into the nose daily.   nystatin  100000 UNIT/ML suspension Commonly known as: MYCOSTATIN  Take 5 mLs (500,000 Units total) by mouth 4  (four) times daily.   omeprazole  40 MG capsule Commonly known as: PRILOSEC Take 1 capsule (40 mg total) by mouth daily as needed.   ondansetron  8 MG disintegrating tablet Commonly known as: ZOFRAN -ODT DISSOLVE 1 TABLET ON TONGUE EVERY 8 HOURS AS NEEDED FOR NAUSEA/VOMITING   OVER THE COUNTER MEDICATION mucinex   oxyCODONE -acetaminophen  7.5-325 MG tablet Commonly known as: Percocet 1 twice daily as needed pain caution drowsiness home use only   phenazopyridine  100 MG tablet Commonly known as: Pyridium  Take 1 tablet (100 mg total) by mouth 3 (three) times daily as needed for pain.   rosuvastatin  10 MG tablet Commonly known as: CRESTOR  Take 1 tablet (10 mg total) by mouth daily.   Teriparatide  560 MCG/2.24ML Sopn Commonly known as: Forteo  Inject 20 mcg into the skin daily at 2 PM.   Vitamin D3 125 MCG (5000 UT) Tabs Take 1 tablet (5,000 Units total) by mouth daily.        Allergies:  Allergies  Allergen Reactions   Ciprofloxacin  Other (See  Comments)    Patient states with in five minutes of taking developed a splitting headache,itching all over and burning in mouth and lips.   Augmentin  [Amoxicillin -Pot Clavulanate] Diarrhea   Carafate  [Sucralfate ]     Mouth tingling   Celexa  [Citalopram  Hydrobromide] Nausea And Vomiting   Chantix [Varenicline] Nausea And Vomiting   Sulfa Antibiotics Itching   Sulfur Itching   Wellbutrin  [Bupropion ]     Severe weight loss and loss of appetite   Cefdinir  Diarrhea    Severe diarrhea   Fosamax  [Alendronate  Sodium] Nausea And Vomiting    GERD can not tolerate   Gabapentin      Sleepy and did not help    Family History: Family History  Problem Relation Age of Onset   Colon cancer Maternal Grandfather 24   Colon polyps Mother 62   Colon cancer Maternal Uncle 13   Hypertension Father    Diabetes Father    Heart disease Father    Hyperlipidemia Father    Stroke Father     Social History:  reports that she has been smoking  cigarettes. She has a 25 pack-year smoking history. She has never used smokeless tobacco. She reports that she does not drink alcohol and does not use drugs.  ROS: All other review of systems were reviewed and are negative except what is noted above in HPI  Physical Exam: BP 135/71   Pulse 80   Constitutional:  Alert and oriented, No acute distress. HEENT: [place] AT, moist mucus membranes.  Trachea midline, no masses. Cardiovascular: No clubbing, cyanosis, or edema. Respiratory: Normal respiratory effort, no increased work of breathing. GI: Abdomen is soft, nontender, nondistended, no abdominal masses GU: No CVA tenderness.  Lymph: No cervical or inguinal lymphadenopathy. Skin: No rashes, bruises or suspicious lesions. Neurologic: Grossly intact, no focal deficits, moving all 4 extremities. Psychiatric: Normal mood and affect.  Laboratory Data: Lab Results  Component Value Date   WBC 8.4 11/24/2023   HGB 12.9 11/24/2023   HCT 40.1 11/24/2023   MCV 94.4 11/24/2023   PLT 293 11/24/2023    Lab Results  Component Value Date   CREATININE 0.57 01/25/2024    No results found for: PSA  No results found for: TESTOSTERONE  Lab Results  Component Value Date   HGBA1C 5.7 (H) 12/10/2022    Urinalysis    Component Value Date/Time   COLORURINE YELLOW 11/24/2023 2115   APPEARANCEUR Clear 05/15/2024 1643   LABSPEC 1.021 11/24/2023 2115   PHURINE 6.0 11/24/2023 2115   GLUCOSEU Negative 05/15/2024 1643   HGBUR MODERATE (A) 11/24/2023 2115   BILIRUBINUR Negative 05/15/2024 1643   KETONESUR negative 03/07/2024 1804   KETONESUR 5 (A) 11/24/2023 2115   PROTEINUR 1+ (A) 05/15/2024 1643   PROTEINUR 30 (A) 11/24/2023 2115   UROBILINOGEN 0.2 03/07/2024 1804   NITRITE Negative 05/15/2024 1643   NITRITE NEGATIVE 11/24/2023 2115   LEUKOCYTESUR Trace (A) 05/15/2024 1643   LEUKOCYTESUR NEGATIVE 11/24/2023 2115    Lab Results  Component Value Date   LABMICR See below: 10/19/2023    WBCUA None seen 10/19/2023   LABEPIT >10 (A) 10/19/2023   MUCUS Present 03/03/2022   BACTERIA NONE SEEN 11/24/2023    Pertinent Imaging:  Results for orders placed during the hospital encounter of 04/27/22  DG Abd 1 View  Narrative CLINICAL DATA:  left flank pain, hematuria, dysuria, hx of kidney stones  EXAM: ABDOMEN - 1 VIEW  COMPARISON:  11/17/2021  FINDINGS: The bowel gas pattern is normal. Hepatomegaly. No  radio-opaque calculi or other significant radiographic abnormality are seen. Moderate-large volume of stool throughout the colon.  IMPRESSION: 1. No radiopaque calculi identified. 2. Hepatomegaly. 3. Moderate-large volume of stool throughout the colon.   Electronically Signed By: Mabel Converse D.O. On: 04/27/2022 09:35  No results found for this or any previous visit.  No results found for this or any previous visit.  No results found for this or any previous visit.  Results for orders placed in visit on 03/28/17  US  Renal  Narrative CLINICAL DATA:  2-3 weeks of left sided renal colic symptoms. Known bilateral kidney stones.  EXAM: RENAL / URINARY TRACT ULTRASOUND COMPLETE  COMPARISON:  [facility] of March 25, 2017  FINDINGS: Right Kidney:  Length: 10.8 cm. The renal cortical echotexture remains lower than that of the liver. There is no hydronephrosis. No discrete stones are observed.  Left Kidney:  Length: 12.2 cm. The renal cortical echotexture is similar to that on the right. There is no hydronephrosis. No stones are evident.  Bladder:  Appears normal for degree of bladder distention.  IMPRESSION: Normal renal ultrasound examination.  No hydronephrosis.   Electronically Signed By: David  Jordan M.D. On: 03/29/2017 14:53  No results found for this or any previous visit.  No results found for this or any previous visit.  Results for orders placed in visit on 11/17/21  CT RENAL STONE STUDY  Narrative CLINICAL DATA:   Left-sided flank pain with dysuria.  EXAM: CT ABDOMEN AND PELVIS WITHOUT CONTRAST  TECHNIQUE: Multidetector CT imaging of the abdomen and pelvis was performed following the standard protocol without IV contrast.  RADIATION DOSE REDUCTION: This exam was performed according to the departmental dose-optimization program which includes automated exposure control, adjustment of the mA and/or kV according to patient size and/or use of iterative reconstruction technique.  COMPARISON:  10/09/2015  FINDINGS: Lower chest: Unremarkable.  Hepatobiliary: Stable 7 mm hypodensity medial segment left liver consistent with benign etiology. No followup recommended. There is no evidence for gallstones, gallbladder wall thickening, or pericholecystic fluid. No intrahepatic or extrahepatic biliary dilation.  Pancreas: No focal mass lesion. No dilatation of the main duct. No intraparenchymal cyst. No peripancreatic edema.  Spleen: No splenomegaly. No focal mass lesion.  Adrenals/Urinary Tract: No adrenal nodule or mass. Kidneys unremarkable. No evidence for hydroureter. The urinary bladder appears normal for the degree of distention.  Stomach/Bowel: Stomach is unremarkable. No gastric wall thickening. No evidence of outlet obstruction. Duodenum is normally positioned as is the ligament of Treitz. No small bowel wall thickening. No small bowel dilatation. The terminal ileum is normal. The appendix is normal. No gross colonic mass. No colonic wall thickening.  Vascular/Lymphatic: There is mild atherosclerotic calcification of the abdominal aorta without aneurysm. There is no gastrohepatic or hepatoduodenal ligament lymphadenopathy. No retroperitoneal or mesenteric lymphadenopathy. No pelvic sidewall lymphadenopathy.  Reproductive: Unremarkable.  Other: No intraperitoneal free fluid.  Musculoskeletal: No worrisome lytic or sclerotic osseous abnormality.  IMPRESSION: No acute findings in  the abdomen or pelvis. Specifically, no findings to explain the patient's history of left flank pain.  Aortic Atherosclerosis (ICD10-I70.0).   Electronically Signed By: Camellia Candle M.D. On: 11/17/2021 15:55   Assessment & Plan:    1. Gross hematuria (Primary) CT stone study Urine for cytology - Urinalysis, Routine w reflex microscopic   No follow-ups on file.  Belvie Clara, MD  [facility] [place]

## 2024-05-31 ENCOUNTER — Ambulatory Visit: Admitting: Nurse Practitioner

## 2024-05-31 ENCOUNTER — Encounter: Payer: Self-pay | Admitting: Nurse Practitioner

## 2024-05-31 VITALS — BP 129/78 | HR 79 | Temp 97.5°F | Ht 60.0 in | Wt 97.0 lb

## 2024-05-31 DIAGNOSIS — Z79891 Long term (current) use of opiate analgesic: Secondary | ICD-10-CM | POA: Diagnosis not present

## 2024-05-31 DIAGNOSIS — S29012A Strain of muscle and tendon of back wall of thorax, initial encounter: Secondary | ICD-10-CM | POA: Insufficient documentation

## 2024-05-31 DIAGNOSIS — M62838 Other muscle spasm: Secondary | ICD-10-CM | POA: Diagnosis not present

## 2024-05-31 DIAGNOSIS — M25552 Pain in left hip: Secondary | ICD-10-CM

## 2024-05-31 DIAGNOSIS — Z23 Encounter for immunization: Secondary | ICD-10-CM | POA: Diagnosis not present

## 2024-05-31 DIAGNOSIS — F17200 Nicotine dependence, unspecified, uncomplicated: Secondary | ICD-10-CM

## 2024-05-31 LAB — CYTOLOGY, URINE

## 2024-05-31 MED ORDER — OXYCODONE-ACETAMINOPHEN 7.5-325 MG PO TABS: ORAL_TABLET | ORAL | 0 refills | Status: AC

## 2024-05-31 MED ORDER — OXYCODONE-ACETAMINOPHEN 7.5-325 MG PO TABS: ORAL_TABLET | ORAL | 0 refills | Status: DC

## 2024-05-31 NOTE — Patient Instructions (Addendum)
 [facility] Massage therapist [phone]

## 2024-06-01 ENCOUNTER — Encounter: Payer: Self-pay | Admitting: Nurse Practitioner

## 2024-06-01 NOTE — Progress Notes (Signed)
 Subjective:    Patient ID: Isabel Atkinson, female    DOB: [birth year], 58 y.o.   MRN: [id number]  HPI Discussed the use of AI scribe software for clinical note transcription with the patient, who gave verbal consent to proceed.  History of Present Illness Isabel Atkinson is a 58 year old female who presents for pain management.  She experiences significant chronic pain, primarily in her neck and left side of her back. The neck pain is severe, reaching a level of 10 out of 10.  She reports a history of neck surgery. Has an appointment with her neurosurgeon late November for recheck. She was previously on hydrocodone  but has been switched to oxycodone  7.5 mg, which she takes at night as needed for pain, and occasionally a second dose on weekends when she is not driving or caring for her grandchildren. The oxycodone  reduces her pain from a 10 to a 3 or 4, allowing her to rest at night. During the day, she manages her pain with Advil  and Tylenol , as she avoids taking stronger pain medication while driving or caring for her grandchildren. UDS up to date.   She also reports chronic pain in her left hip and has been diagnosed with a possible kidney stone, contributing to her discomfort. Followed by urology. She received a plasma injection in her hamstring three months ago, which was expected to take time to show improvement, but still causing pain.  She smokes about a pack of cigarettes a day. No breathing difficulties.    Review of Systems  Constitutional:  Positive for fatigue.  Respiratory:  Negative for cough, chest tightness, shortness of breath and wheezing.   Cardiovascular:  Negative for chest pain.  Musculoskeletal:  Positive for neck pain and neck stiffness.   Social History   Tobacco Use   Smoking status: Every Day    Current packs/day: 1.00    Average packs/day: 1 pack/day for 25.0 years (25.0 ttl pk-yrs)    Types: Cigarettes   Smokeless tobacco: Never  Vaping Use   Vaping  status: Never Used  Substance Use Topics   Alcohol use: No    Alcohol/week: 0.0 standard drinks of alcohol   Drug use: No        Objective:   Physical Exam Vitals and nursing note reviewed.  Constitutional:      General: She is not in acute distress. Cardiovascular:     Rate and Rhythm: Normal rate and regular rhythm.  Pulmonary:     Effort: Pulmonary effort is normal.     Breath sounds: Normal breath sounds.  Musculoskeletal:     Comments: Severe muscle spasms and tenderness noted along the lateral neck area, trapezius and rhomboids bilaterally. Limited ROM of the neck produces tenderness in these areas.   Neurological:     Mental Status: She is alert and oriented to person, place, and time.  Psychiatric:        Mood and Affect: Mood normal.        Behavior: Behavior normal.        Thought Content: Thought content normal.        Judgment: Judgment normal.    Today's Vitals   05/31/24 1412  BP: 129/78  Pulse: 79  Temp: (!) 97.5 F (36.4 C)  SpO2: 98%  Weight: 97 lb (44 kg)  Height: 5' (1.524 m)   Body mass index is 18.94 kg/m.        Assessment & Plan:  1. Muscle spasms of  neck (Primary) Chronic neck pain with severe muscle spasms in rhomboid and trapezius muscles, post-surgical. Pain 10/10, spasms limit motion. - Prescribe TENS unit for spasms. - Recommend massage therapy for spasms. - Advise ice or heat applications as tolerated. - Consider muscle relaxants if needed.  2. Muscle strain of upper back - Prescribe TENS unit for spasms. - Recommend massage therapy for spasms. - Advise ice or heat applications as tolerated. - Consider muscle relaxants if ne  3. Immunization due  - Flu vaccine trivalent PF, 6mos and older(Flulaval,Afluria,Fluarix,Fluzone)  4. 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 Pain management complicated by need to avoid daytime sedation. Current regimen: oxycodone  at night,  Advil  during day. - Continue oxycodone  at night and Advil  during day. - Explore non-pharmacological options like TENS and massage therapy.  - oxyCODONE -acetaminophen  (PERCOCET) 7.5-325 MG tablet; 1 twice daily as needed pain caution drowsiness home use only  Dispense: 38 tablet; Refill: 0 - oxyCODONE -acetaminophen  (PERCOCET) 7.5-325 MG tablet; Take one tab twice daily as needed for pain. Caution drowsiness. Home use only. May fill 30 days from 05/31/24  Dispense: 38 tablet; Refill: 0 - oxyCODONE -acetaminophen  (PERCOCET) 7.5-325 MG tablet; Take one tab po BID prn pain. Caution drowsiness. Home use only.  Dispense: 38 tablet; Refill: 0  5. Pain of left hip Continue current pain medication as directed.   6. Current smoker Smokes one pack per day. Discussed risks of COPD and lung cancer. Encouraged smoking reduction. - Encourage reduction to half a pack per day. - Discuss health risks and benefits of reduction.  Return in about 3 months (around 08/31/2024).

## 2024-06-02 ENCOUNTER — Ambulatory Visit (HOSPITAL_COMMUNITY)
Admission: RE | Admit: 2024-06-02 | Discharge: 2024-06-02 | Disposition: A | Discharge status: Home / Self Care | Source: Ambulatory Visit | Attending: Urology | Admitting: Urology

## 2024-06-02 DIAGNOSIS — N200 Calculus of kidney: Secondary | ICD-10-CM | POA: Diagnosis present

## 2024-06-05 ENCOUNTER — Ambulatory Visit: Payer: Self-pay | Admitting: Urology

## 2024-06-05 ENCOUNTER — Encounter: Payer: Self-pay | Admitting: Urology

## 2024-06-05 NOTE — Patient Instructions (Signed)
 Blood in the Pee (Hematuria) in Adults: What to Know  Hematuria is blood in the pee. You may be able to see blood in the pee. In some cases, a health care provider may find blood with a test.  Blood in the pee can be caused by infections of the kidney, bladder, or the urethra. The urethra is the tube that drains pee from the bladder.  Other causes may include: Kidney stones. Infection of the prostate. Cancer. Too much calcium in the pee. Conditions that are passed from parent to child. Too much exercise. Infections can be treated with medicine. A kidney stone will usually leave your body when you pee. If infections or kidney stones didn't cause the blood in the urine, then more tests may be needed. It is very important to tell your provider about any blood in your pee, even if you have no pain or the blood stops with no treatment. Blood in the pee can be a sign of a very serious problem, such as cancer. Follow these instructions at home: Medicines Take your medicines only as told. If you were given antibiotics, take them as told. Do not stop taking them even if you start to feel better. Eating and drinking Drink more fluids as told. Aim to drink 3-4 quarts (2.8-3.8 L) a day. Avoid caffeine, tea, and carbonated drinks. These can bother the bladder. Avoid alcohol if a female because it may irritate the prostate. General instructions If you have been diagnosed with a kidney stone, strain your pee to catch the stone if told by your provider. Empty your bladder often. Avoid holding pee for a long time. If you're female, make sure that: You wipe from front to back after using the bathroom. You use each piece of toilet paper only once. You pee before and after sex. It's up to you to get the results of any tests. Ask when your results will be ready and how to get them. You may need to call or meet with your provider to get your results. Keep all follow-up visits. Your provider will need to know  about any changes or any new symptoms. Contact a health care provider if: Your symptoms don't get better after 3 days. Your symptoms get worse. You have back pain or belly pain. You have a fever or chills. You throw up or feel like you may throw up. You throw up every time you take medicine. Get help right away if: You pass blood clots in your pee. You pass out. These symptoms may be an emergency. Call 911 right away. Do not wait to see if the symptoms will go away. Do not drive yourself to the hospital. This information is not intended to replace advice given to you by your health care provider. Make sure you discuss any questions you have with your health care provider. Document Revised: 05/12/2023 Document Reviewed: 04/21/2023 Elsevier Patient Education  2024 [employer].

## 2024-06-13 ENCOUNTER — Ambulatory Visit
Admission: RE | Admit: 2024-06-13 | Discharge: 2024-06-13 | Disposition: A | Discharge status: Home / Self Care | Payer: Self-pay | Attending: Nurse Practitioner | Admitting: Nurse Practitioner

## 2024-06-13 VITALS — BP 144/87 | HR 92 | Temp 98.3°F | Resp 16

## 2024-06-13 DIAGNOSIS — J069 Acute upper respiratory infection, unspecified: Secondary | ICD-10-CM | POA: Diagnosis not present

## 2024-06-13 DIAGNOSIS — J441 Chronic obstructive pulmonary disease with (acute) exacerbation: Secondary | ICD-10-CM

## 2024-06-13 LAB — POC COVID19/FLU A&B COMBO
Covid Antigen, POC: NEGATIVE
Influenza A Antigen, POC: NEGATIVE
Influenza B Antigen, POC: NEGATIVE

## 2024-06-13 MED ORDER — PROMETHAZINE-DM 6.25-15 MG/5ML PO SYRP: 5.0000 mL | ORAL_SOLUTION | Freq: Four times a day (QID) | ORAL | 0 refills | Status: DC | PRN

## 2024-06-13 MED ORDER — PREDNISONE 20 MG PO TABS: 40.0000 mg | ORAL_TABLET | Freq: Every day | ORAL | 0 refills | Status: DC

## 2024-06-13 MED ORDER — ALBUTEROL SULFATE HFA 108 (90 BASE) MCG/ACT IN AERS: 2.0000 | INHALATION_SPRAY | RESPIRATORY_TRACT | 0 refills | Status: AC | PRN

## 2024-06-13 NOTE — Discharge Instructions (Signed)
 In addition to the prescribed medications, you may take Coricidin HBP, plain Mucinex, use saline sinus rinses and over-the-counter pain relievers as needed.

## 2024-06-13 NOTE — ED Triage Notes (Signed)
 Pt reports cough , congestion, headache, green colored mucus  had clindamycin  was taking it felt better, stopped that and started feeling worse all over again

## 2024-06-13 NOTE — ED Provider Notes (Signed)
 [facility]    CSN: [id number] Arrival date & time: 06/13/24  0855      History   Chief Complaint Chief Complaint  Patient presents with   Cough    sick - Entered by patient    HPI Isabel Atkinson is a 58 y.o. female.   Patient presenting today with a little over a week of congestion, cough, headache, chest tightness, wheezing.  She states she took a 5-day course of leftover clindamycin  and symptoms seem to get a bit better while taking that but symptoms have worsened again since being off of it.  Denies chest pain, shortness of breath, vomiting, diarrhea, rashes.  Try Mucinex with minimal relief.  History of COPD and reactive airway disease not currently on any inhalers.    Past Medical History:  Diagnosis Date   Anxiety    COPD (chronic obstructive pulmonary disease) (HCC)    Dysrhythmia    GERD (gastroesophageal reflux disease)    Hypercholesterolemia    Pre-diabetes    Reactive airway disease    Tachycardia     Patient Active Problem List   Diagnosis Date Noted   Muscle spasms of neck 05/31/2024   Muscle strain of upper back 05/31/2024   Chronic rhinitis 05/20/2024   Deviated nasal septum 05/20/2024   Hypertrophy of nasal turbinates 05/20/2024   Encounter for long-term opiate analgesic use 02/26/2024   Hip pain 02/26/2024   Insect bite of right lower leg 02/26/2024   Medication intolerance 02/06/2024   Bacterial URI 10/19/2023   Rectal pain 06/01/2022   Left sided abdominal pain 06/01/2022   Hematuria 11/17/2021   Encounter for screening colonoscopy 11/03/2021   Coronary artery disease involving native heart without angina pectoris 02/26/2020   Aortic atherosclerosis 02/26/2020   Current smoker 12/04/2019   Neurodermatitis 06/28/2016   COPD (chronic obstructive pulmonary disease) (HCC) 06/10/2016   Osteoporosis 07/20/2014   Irritable bowel syndrome 07/05/2014   Esophageal dysphagia 01/11/2012   GERD (gastroesophageal reflux disease)  01/11/2012   Chronic diarrhea 01/11/2012    Past Surgical History:  Procedure Laterality Date   BREAST BIOPSY Left 05/22/2024   US  LT BREAST BX W LOC DEV 1ST LESION IMG BX SPEC US  GUIDE 05/22/2024 Mir, Aliene SAUNDERS, MD [facility]   CATARACT EXTRACTION W/PHACO Left 01/17/2017   Procedure: CATARACT EXTRACTION PHACO AND INTRAOCULAR LENS PLACEMENT (IOC);  Surgeon: Perley Hamilton, MD;  Location: [facility];  Service: Ophthalmology;  Laterality: Left;  CDE: 6.92   CATARACT EXTRACTION W/PHACO Right 03/07/2020   Procedure: CATARACT EXTRACTION PHACO AND INTRAOCULAR LENS PLACEMENT RIGHT EYE;  Surgeon: Harrie Agent, MD;  Location: [facility];  Service: Ophthalmology;  Laterality: Right;  CDE: 8.44   COLONOSCOPY  2013   RMR: Normal rectum. single diminutive sigmoid polyp noted above. Normal terminal ileum. Status post segmental biopsy. next TCS in 10 years.    COLONOSCOPY WITH PROPOFOL  N/A 01/27/2022   Procedure: COLONOSCOPY WITH PROPOFOL ;  Surgeon: Shaaron Lamar HERO, MD;  Location: [facility];  Service: Endoscopy;  Laterality: N/A;  12:45pm   DENTAL SURGERY  01/2024   ELBOW SURGERY Right 2023   ESOPHAGOGASTRODUODENOSCOPY  2013   RMR: Possible cervical esophageal web-status post dilation as described above. Small hiatal hernia. Status post biopsy of normal appearing small bowel to screen for celiac disease.    ESOPHAGOGASTRODUODENOSCOPY N/A 05/06/2016   normal esophagus, small hiatal hernia, empiric dilation.   ESOPHAGOGASTRODUODENOSCOPY (EGD) WITH PROPOFOL  N/A 01/27/2022   Procedure: ESOPHAGOGASTRODUODENOSCOPY (EGD) WITH PROPOFOL ;  Surgeon: Shaaron Lamar HERO,  MD;  Location: [facility];  Service: Endoscopy;  Laterality: N/A;   EYE SURGERY Bilateral    Cat Sx - Symfony MF IOLs   MALONEY DILATION N/A 05/06/2016   Procedure: MALONEY DILATION;  Surgeon: Lamar CHRISTELLA Hollingshead, MD;  Location: [facility];  Service: Endoscopy;  Laterality: N/A;   MALONEY DILATION N/A 01/27/2022   Procedure: AGAPITO DILATION;  Surgeon:  Hollingshead Lamar CHRISTELLA, MD;  Location: [facility];  Service: Endoscopy;  Laterality: N/A;   NECK SURGERY  10/2012   OVARIAN CYST SURGERY  1997   removed   POLYPECTOMY  01/27/2022   Procedure: POLYPECTOMY;  Surgeon: Hollingshead Lamar CHRISTELLA, MD;  Location: [facility];  Service: Endoscopy;;  cecal   ROBOTIC ASSISTED LAPAROSCOPIC HYSTERECTOMY AND SALPINGECTOMY Bilateral 12/14/2022   Procedure: XI ROBOTIC ASSISTED LAPAROSCOPIC HYSTERECTOMY AND SALPINGO-OOPHORECTOMY;  Surgeon: Jayne Vonn DEL, MD;  Location: [facility];  Service: Gynecology;  Laterality: Bilateral;   TUBAL LIGATION  2000   VAGINAL PROLAPSE REPAIR N/A 12/14/2022   Procedure: VAGINAL VAULT SUSPENSION;  Surgeon: Jayne Vonn DEL, MD;  Location: [facility];  Service: Gynecology;  Laterality: N/A;   YAG LASER APPLICATION Bilateral     OB History     Gravida  3   Para  2   Term  2   Preterm      AB  1   Living  2      SAB      IAB      Ectopic      Multiple      Live Births               Home Medications    Prior to Admission medications   Medication Sig Start Date End Date Taking? Authorizing Provider  albuterol  (VENTOLIN  HFA) 108 (90 Base) MCG/ACT inhaler Inhale 2 puffs into the lungs every 4 (four) hours as needed. 06/13/24  Yes Stuart Vernell Norris, PA-C  predniSONE  (DELTASONE ) 20 MG tablet Take 2 tablets (40 mg total) by mouth daily with breakfast. 06/13/24  Yes Stuart Vernell Norris, PA-C  promethazine -dextromethorphan (PROMETHAZINE -DM) 6.25-15 MG/5ML syrup Take 5 mLs by mouth 4 (four) times daily as needed. 06/13/24  Yes Stuart Vernell Norris, PA-C  ALPRAZolam  (XANAX ) 0.25 MG tablet TAKE 1/2 TO 1 TABLET BY MOUTH TWICE DAILY AS NEEDED FOR ANXIETY. 02/04/20   Alphonsa Glendia LABOR, MD  aspirin  EC 81 MG tablet Take 81 mg by mouth daily. Swallow whole.    [provider]  Bromfenac  Sodium (PROLENSA ) 0.07 % SOLN Place 1 drop into the right eye daily. 09/24/22   Valdemar Rogue, MD  cephALEXin  (KEFLEX ) 500 MG capsule Take 1  capsule (500 mg total) by mouth 3 (three) times daily. 05/15/24   Alphonsa Glendia LABOR, MD  Cholecalciferol (VITAMIN D3) 125 MCG (5000 UT) TABS Take 1 tablet (5,000 Units total) by mouth daily. 10/07/23   Okey Vina GAILS, MD  cycloSPORINE (RESTASIS) 0.05 % ophthalmic emulsion Place 1 drop into both eyes 2 (two) times daily.    [provider]  EPINEPHrine  0.3 mg/0.3 mL IJ SOAJ injection Inject 0.3 mg into the muscle as needed for anaphylaxis. 03/02/24   Alphonsa Glendia LABOR, MD  estradiol  (ESTRACE ) 0.1 MG/GM vaginal cream Place 0.5 Applicatorfuls vaginally every other day. 07/11/23   [provider]  ibuprofen  (ADVIL ) 600 MG tablet Take 1 tablet (600 mg total) by mouth every 8 (eight) hours as needed. 03/28/23   Alphonsa Glendia LABOR, MD  magic mouthwash (nystatin , hydrocortisone , diphenhydrAMINE ,  lidocaine ) suspension Swish and spit 5 mLs 4 (four) times daily as needed for mouth pain. 12/20/23   Mauro Elveria BROCKS, NP  nystatin  (MYCOSTATIN ) 100000 UNIT/ML suspension Take 5 mLs (500,000 Units total) by mouth 4 (four) times daily. 12/30/23   Alphonsa Glendia LABOR, MD  omeprazole  (PRILOSEC) 40 MG capsule Take 1 capsule (40 mg total) by mouth daily as needed. 02/25/23   Ezzard Sonny RAMAN, PA-C  ondansetron  (ZOFRAN -ODT) 8 MG disintegrating tablet DISSOLVE 1 TABLET ON TONGUE EVERY 8 HOURS AS NEEDED FOR NAUSEA/VOMITING 05/21/24   Mauro Elveria BROCKS, NP  OVER THE COUNTER MEDICATION mucinex    [provider]  oxyCODONE -acetaminophen  (PERCOCET) 7.5-325 MG tablet 1 twice daily as needed pain caution drowsiness home use only 05/31/24   Hoskins, Carolyn C, NP  oxyCODONE -acetaminophen  (PERCOCET) 7.5-325 MG tablet Take one tab twice daily as needed for pain. Caution drowsiness. Home use only. May fill 30 days from 05/31/24 05/31/24   Mauro Elveria BROCKS, NP  oxyCODONE -acetaminophen  (PERCOCET) 7.5-325 MG tablet Take one tab po BID prn pain. Caution drowsiness. Home use only. 05/31/24   Mauro Elveria BROCKS, NP   phenazopyridine  (PYRIDIUM ) 100 MG tablet Take 1 tablet (100 mg total) by mouth 3 (three) times daily as needed for pain. 05/15/24   Alphonsa Glendia LABOR, MD  rosuvastatin  (CRESTOR ) 10 MG tablet Take 1 tablet (10 mg total) by mouth daily. 11/10/23   Alphonsa Glendia LABOR, MD  Teriparatide  (FORTEO ) 560 MCG/2.24ML SOPN Inject 20 mcg into the skin daily at 2 PM. 02/08/24   Nida, Gebreselassie W, MD  Triamcinolone  Acetonide (NASACORT  ALLERGY 24HR NA) Place 2 sprays into the nose daily.    [provider]    Family History Family History  Problem Relation Age of Onset   Colon cancer Maternal Grandfather 56   Colon polyps Mother 66   Colon cancer Maternal Uncle 17   Hypertension Father    Diabetes Father    Heart disease Father    Hyperlipidemia Father    Stroke Father     Social History Social History   Tobacco Use   Smoking status: Every Day    Current packs/day: 1.00    Average packs/day: 1 pack/day for 25.0 years (25.0 ttl pk-yrs)    Types: Cigarettes   Smokeless tobacco: Never  Vaping Use   Vaping status: Never Used  Substance Use Topics   Alcohol use: No    Alcohol/week: 0.0 standard drinks of alcohol   Drug use: No     Allergies   Ciprofloxacin , Augmentin  [amoxicillin -pot clavulanate], Carafate  [sucralfate ], Celexa  [citalopram  hydrobromide], Chantix [varenicline], Sulfa antibiotics, Sulfur, Wellbutrin  [bupropion ], Cefdinir , Fosamax  [alendronate  sodium], and Gabapentin    Review of Systems Review of Systems PER HPI  Physical Exam Triage Vital Signs ED Triage Vitals [06/13/24 0858]  Encounter Vitals Group     BP (!) 144/87     Girls Systolic BP Percentile      Girls Diastolic BP Percentile      Boys Systolic BP Percentile      Boys Diastolic BP Percentile      Pulse Rate 92     Resp 16     Temp 98.3 F (36.8 C)     Temp src      SpO2 98 %     Weight      Height      Head Circumference      Peak Flow      Pain Score      Pain Loc  Pain Education       Exclude from Growth Chart    No data found.  Updated Vital Signs BP (!) 144/87 (BP Location: Right Arm)   Pulse 92   Temp 98.3 F (36.8 C)   Resp 16   SpO2 98%   Visual Acuity Right Eye Distance:   Left Eye Distance:   Bilateral Distance:    Right Eye Near:   Left Eye Near:    Bilateral Near:     Physical Exam Vitals and nursing note reviewed.  Constitutional:      Appearance: Normal appearance.  HENT:     Head: Atraumatic.     Right Ear: Tympanic membrane and external ear normal.     Left Ear: Tympanic membrane and external ear normal.     Nose: Rhinorrhea present.     Mouth/Throat:     Mouth: Mucous membranes are moist.     Pharynx: Posterior oropharyngeal erythema present.  Eyes:     Extraocular Movements: Extraocular movements intact.     Conjunctiva/sclera: Conjunctivae normal.  Cardiovascular:     Rate and Rhythm: Normal rate and regular rhythm.     Heart sounds: Normal heart sounds.  Pulmonary:     Effort: Pulmonary effort is normal.     Breath sounds: Wheezing present. No rales.  Musculoskeletal:        General: Normal range of motion.     Cervical back: Normal range of motion and neck supple.  Skin:    General: Skin is warm and dry.  Neurological:     Mental Status: She is alert and oriented to person, place, and time.  Psychiatric:        Mood and Affect: Mood normal.        Thought Content: Thought content normal.      UC Treatments / Results  Labs (all labs ordered are listed, but only abnormal results are displayed) Labs Reviewed  POC COVID19/FLU A&B COMBO    EKG   Radiology No results found.  Procedures Procedures (including critical care time)  Medications Ordered in UC Medications - No data to display  Initial Impression / Assessment and Plan / UC Course  I have reviewed the triage vital signs and the nursing notes.  Pertinent labs & imaging results that were available during my care of the patient were reviewed by me and  considered in my medical decision making (see chart for details).     Rapid flu and COVID-negative, minimally hypertensive in triage otherwise vital signs reassuring.  Suspect viral respiratory infection causing a COPD exacerbation.  Treat with prednisone , albuterol , Phenergan  DM, supportive over-the-counter medications and home care.  Return for worsening or unresolving symptoms.  Final Clinical Impressions(s) / UC Diagnoses   Final diagnoses:  Viral URI with cough  COPD exacerbation (HCC)     Discharge Instructions      In addition to the prescribed medications, you may take Coricidin HBP, plain Mucinex, use saline sinus rinses and over-the-counter pain relievers as needed.    ED Prescriptions     Medication Sig Dispense Auth. Provider   predniSONE  (DELTASONE ) 20 MG tablet Take 2 tablets (40 mg total) by mouth daily with breakfast. 10 tablet Stuart Vernell Norris, PA-C   albuterol  (VENTOLIN  HFA) 108 (90 Base) MCG/ACT inhaler Inhale 2 puffs into the lungs every 4 (four) hours as needed. 18 g Stuart Vernell Norris, PA-C   promethazine -dextromethorphan (PROMETHAZINE -DM) 6.25-15 MG/5ML syrup Take 5 mLs by mouth 4 (four) times daily as needed.  100 mL Stuart Vernell Norris, [place]      PDMP not reviewed this encounter.   Stuart Vernell [place], [place] 06/13/24 [phone]

## 2024-06-14 ENCOUNTER — Encounter: Payer: Self-pay | Admitting: Nurse Practitioner

## 2024-06-28 ENCOUNTER — Other Ambulatory Visit: Payer: Self-pay | Admitting: Family Medicine

## 2024-06-28 ENCOUNTER — Encounter: Payer: Self-pay | Admitting: Family Medicine

## 2024-06-28 DIAGNOSIS — Z79891 Long term (current) use of opiate analgesic: Secondary | ICD-10-CM

## 2024-06-28 MED ORDER — OXYCODONE-ACETAMINOPHEN 7.5-325 MG PO TABS: ORAL_TABLET | ORAL | 0 refills | Status: AC

## 2024-07-11 ENCOUNTER — Ambulatory Visit: Admitting: Urology

## 2024-07-11 VITALS — BP 122/72 | HR 80

## 2024-07-11 DIAGNOSIS — R3129 Other microscopic hematuria: Secondary | ICD-10-CM

## 2024-07-11 DIAGNOSIS — N200 Calculus of kidney: Secondary | ICD-10-CM

## 2024-07-11 NOTE — Progress Notes (Signed)
 07/11/2024 3:28 PM   Isabel Atkinson 02-06-1966 [id number]  Referring provider: Alphonsa Glendia LABOR, MD [address] [place],  [place] [address]  Followup nephrolithiasis   HPI: Isabel Atkinson is a 58yo here for followup for microhematuria and nephrolithiasis. CT showed 4mm right mid to lower pole calculus. Urine cytology negative. She denies any flank pain. She denies any worsening LUTS. No other complaints today   PMH: Past Medical History:  Diagnosis Date   Anxiety    COPD (chronic obstructive pulmonary disease) (HCC)    Dysrhythmia    GERD (gastroesophageal reflux disease)    Hypercholesterolemia    Pre-diabetes    Reactive airway disease    Tachycardia     Surgical History: Past Surgical History:  Procedure Laterality Date   BREAST BIOPSY Left 05/22/2024   US  LT BREAST BX W LOC DEV 1ST LESION IMG BX SPEC US  GUIDE 05/22/2024 Mir, Aliene SAUNDERS, MD [facility]   CATARACT EXTRACTION W/PHACO Left 01/17/2017   Procedure: CATARACT EXTRACTION PHACO AND INTRAOCULAR LENS PLACEMENT (IOC);  Surgeon: Perley Hamilton, MD;  Location: [facility];  Service: Ophthalmology;  Laterality: Left;  CDE: 6.92   CATARACT EXTRACTION W/PHACO Right 03/07/2020   Procedure: CATARACT EXTRACTION PHACO AND INTRAOCULAR LENS PLACEMENT RIGHT EYE;  Surgeon: Harrie Agent, MD;  Location: [facility];  Service: Ophthalmology;  Laterality: Right;  CDE: 8.44   COLONOSCOPY  2013   RMR: Normal rectum. single diminutive sigmoid polyp noted above. Normal terminal ileum. Status post segmental biopsy. next TCS in 10 years.    COLONOSCOPY WITH PROPOFOL  N/A 01/27/2022   Procedure: COLONOSCOPY WITH PROPOFOL ;  Surgeon: Shaaron Lamar HERO, MD;  Location: [facility];  Service: Endoscopy;  Laterality: N/A;  12:45pm   DENTAL SURGERY  01/2024   ELBOW SURGERY Right 2023   ESOPHAGOGASTRODUODENOSCOPY  2013   RMR: Possible cervical esophageal web-status post dilation as described above. Small hiatal hernia. Status post biopsy of  normal appearing small bowel to screen for celiac disease.    ESOPHAGOGASTRODUODENOSCOPY N/A 05/06/2016   normal esophagus, small hiatal hernia, empiric dilation.   ESOPHAGOGASTRODUODENOSCOPY (EGD) WITH PROPOFOL  N/A 01/27/2022   Procedure: ESOPHAGOGASTRODUODENOSCOPY (EGD) WITH PROPOFOL ;  Surgeon: Shaaron Lamar HERO, MD;  [facility]: [facility];  Service: Endoscopy;  Laterality: N/A;   EYE SURGERY Bilateral    Cat Sx - Symfony MF IOLs   MALONEY DILATION N/A 05/06/2016   Procedure: MALONEY DILATION;  Surgeon: Lamar HERO Shaaron, MD;  [facility]: [facility];  Service: Endoscopy;  Laterality: N/A;   MALONEY DILATION N/A 01/27/2022   Procedure: AGAPITO DILATION;  Surgeon: Shaaron Lamar HERO, MD;  [facility]: [facility];  Service: Endoscopy;  Laterality: N/A;   NECK SURGERY  10/2012   OVARIAN CYST SURGERY  1997   removed   POLYPECTOMY  01/27/2022   Procedure: POLYPECTOMY;  Surgeon: Shaaron Lamar HERO, MD;  [facility]: [facility];  Service: Endoscopy;;  cecal   ROBOTIC ASSISTED LAPAROSCOPIC HYSTERECTOMY AND SALPINGECTOMY Bilateral 12/14/2022   Procedure: XI ROBOTIC ASSISTED LAPAROSCOPIC HYSTERECTOMY AND SALPINGO-OOPHORECTOMY;  Surgeon: Jayne Vonn DEL, MD;  Location: [facility];  Service: Gynecology;  Laterality: Bilateral;   TUBAL LIGATION  2000   VAGINAL PROLAPSE REPAIR N/A 12/14/2022   Procedure: VAGINAL VAULT SUSPENSION;  Surgeon: Jayne Vonn DEL, MD;  Location: [facility];  Service: Gynecology;  Laterality: N/A;   YAG LASER APPLICATION Bilateral     Home Medications:  Allergies as of 07/11/2024       Reactions   Ciprofloxacin  Other (See  Comments)   Patient states with in five minutes of taking developed a splitting headache,itching all over and burning in mouth and lips.   Augmentin  [amoxicillin -pot Clavulanate] Diarrhea   Carafate  [sucralfate ]    Mouth tingling   Celexa  [citalopram  Hydrobromide] Nausea And Vomiting   Chantix [varenicline] Nausea And Vomiting   Sulfa Antibiotics Itching   Sulfur  Itching   Wellbutrin  [bupropion ]    Severe weight loss and loss of appetite   Cefdinir  Diarrhea   Severe diarrhea   Fosamax  [alendronate  Sodium] Nausea And Vomiting   GERD can not tolerate   Gabapentin     Sleepy and did not help        Medication List        Accurate as of July 11, 2024  3:28 PM. If you have any questions, ask your nurse or doctor.          albuterol  108 (90 Base) MCG/ACT inhaler Commonly known as: VENTOLIN  HFA Inhale 2 puffs into the lungs every 4 (four) hours as needed.   ALPRAZolam  0.25 MG tablet Commonly known as: XANAX  TAKE 1/2 TO 1 TABLET BY MOUTH TWICE DAILY AS NEEDED FOR ANXIETY.   aspirin  EC 81 MG tablet Take 81 mg by mouth daily. Swallow whole.   Bromfenac  Sodium 0.07 % Soln Commonly known as: Prolensa  Place 1 drop into the right eye daily.   cephALEXin  500 MG capsule Commonly known as: KEFLEX  Take 1 capsule (500 mg total) by mouth 3 (three) times daily.   cycloSPORINE 0.05 % ophthalmic emulsion Commonly known as: RESTASIS Place 1 drop into both eyes 2 (two) times daily.   EPINEPHrine  0.3 mg/0.3 mL Soaj injection Commonly known as: EPI-PEN Inject 0.3 mg into the muscle as needed for anaphylaxis.   estradiol  0.1 MG/GM vaginal cream Commonly known as: ESTRACE  Place 0.5 Applicatorfuls vaginally every other day.   ibuprofen  600 MG tablet Commonly known as: ADVIL  Take 1 tablet (600 mg total) by mouth every 8 (eight) hours as needed.   magic mouthwash (nystatin , hydrocortisone , diphenhydrAMINE , lidocaine ) suspension Swish and spit 5 mLs 4 (four) times daily as needed for mouth pain.   NASACORT  ALLERGY 24HR NA Place 2 sprays into the nose daily.   nystatin  100000 UNIT/ML suspension Commonly known as: MYCOSTATIN  Take 5 mLs (500,000 Units total) by mouth 4 (four) times daily.   omeprazole  40 MG capsule Commonly known as: PRILOSEC Take 1 capsule (40 mg total) by mouth daily as needed.   ondansetron  8 MG disintegrating  tablet Commonly known as: ZOFRAN -ODT DISSOLVE 1 TABLET ON TONGUE EVERY 8 HOURS AS NEEDED FOR NAUSEA/VOMITING   OVER THE COUNTER MEDICATION mucinex   oxyCODONE -acetaminophen  7.5-325 MG tablet Commonly known as: PERCOCET Take one tab twice daily as needed for pain. Caution drowsiness. Home use only. May fill 30 days from 05/31/24   oxyCODONE -acetaminophen  7.5-325 MG tablet Commonly known as: PERCOCET Take one tab po BID prn pain. Caution drowsiness. Home use only.   oxyCODONE -acetaminophen  7.5-325 MG tablet Commonly known as: Percocet 1 twice daily as needed pain caution drowsiness home use only   phenazopyridine  100 MG tablet Commonly known as: Pyridium  Take 1 tablet (100 mg total) by mouth 3 (three) times daily as needed for pain.   predniSONE  20 MG tablet Commonly known as: DELTASONE  Take 2 tablets (40 mg total) by mouth daily with breakfast.   promethazine -dextromethorphan 6.25-15 MG/5ML syrup Commonly known as: PROMETHAZINE -DM Take 5 mLs by mouth 4 (four) times daily as needed.   rosuvastatin  10 MG tablet Commonly known  as: CRESTOR  Take 1 tablet (10 mg total) by mouth daily.   Teriparatide  560 MCG/2.24ML Sopn Commonly known as: Forteo  Inject 20 mcg into the skin daily at 2 PM.   Vitamin D3 125 MCG (5000 UT) Tabs Take 1 tablet (5,000 Units total) by mouth daily.        Allergies:  Allergies  Allergen Reactions   Ciprofloxacin  Other (See Comments)    Patient states with in five minutes of taking developed a splitting headache,itching all over and burning in mouth and lips.   Augmentin  [Amoxicillin -Pot Clavulanate] Diarrhea   Carafate  [Sucralfate ]     Mouth tingling   Celexa  [Citalopram  Hydrobromide] Nausea And Vomiting   Chantix [Varenicline] Nausea And Vomiting   Sulfa Antibiotics Itching   Sulfur Itching   Wellbutrin  [Bupropion ]     Severe weight loss and loss of appetite   Cefdinir  Diarrhea    Severe diarrhea   Fosamax  [Alendronate  Sodium] Nausea And  Vomiting    GERD can not tolerate   Gabapentin      Sleepy and did not help    Family History: Family History  Problem Relation Age of Onset   Colon cancer Maternal Grandfather 19   Colon polyps Mother 18   Colon cancer Maternal Uncle 49   Hypertension Father    Diabetes Father    Heart disease Father    Hyperlipidemia Father    Stroke Father     Social History:  reports that she has been smoking cigarettes. She has a 25 pack-year smoking history. She has never used smokeless tobacco. She reports that she does not drink alcohol and does not use drugs.  ROS: All other review of systems were reviewed and are negative except what is noted above in HPI  Physical Exam: BP 122/72   Pulse 80   Constitutional:  Alert and oriented, No acute distress. HEENT: [place] AT, moist mucus membranes.  Trachea midline, no masses. Cardiovascular: No clubbing, cyanosis, or edema. Respiratory: Normal respiratory effort, no increased work of breathing. GI: Abdomen is soft, nontender, nondistended, no abdominal masses GU: No CVA tenderness.  Lymph: No cervical or inguinal lymphadenopathy. Skin: No rashes, bruises or suspicious lesions. Neurologic: Grossly intact, no focal deficits, moving all 4 extremities. Psychiatric: Normal mood and affect.  Laboratory Data: Lab Results  Component Value Date   WBC 8.4 11/24/2023   HGB 12.9 11/24/2023   HCT 40.1 11/24/2023   MCV 94.4 11/24/2023   PLT 293 11/24/2023    Lab Results  Component Value Date   CREATININE 0.57 01/25/2024    No results found for: PSA  No results found for: TESTOSTERONE  Lab Results  Component Value Date   HGBA1C 5.7 (H) 12/10/2022    Urinalysis    Component Value Date/Time   COLORURINE YELLOW 11/24/2023 2115   APPEARANCEUR Clear 05/30/2024 1348   LABSPEC 1.021 11/24/2023 2115   PHURINE 6.0 11/24/2023 2115   GLUCOSEU Negative 05/30/2024 1348   HGBUR MODERATE (A) 11/24/2023 2115   BILIRUBINUR Negative 05/30/2024  1348   KETONESUR negative 03/07/2024 1804   KETONESUR 5 (A) 11/24/2023 2115   PROTEINUR Trace 05/30/2024 1348   PROTEINUR 30 (A) 11/24/2023 2115   UROBILINOGEN 0.2 03/07/2024 1804   NITRITE Negative 05/30/2024 1348   NITRITE NEGATIVE 11/24/2023 2115   LEUKOCYTESUR Negative 05/30/2024 1348   LEUKOCYTESUR NEGATIVE 11/24/2023 2115    Lab Results  Component Value Date   LABMICR See below: 05/30/2024   WBCUA 0-5 05/30/2024   LABEPIT 0-10 05/30/2024   MUCUS  Present 03/03/2022   BACTERIA None seen 05/30/2024    Pertinent Imaging: CT 06/02/24: Images reviewed and discussed with the patient  Results for orders placed during the hospital encounter of 04/27/22  DG Abd 1 View  Narrative CLINICAL DATA:  left flank pain, hematuria, dysuria, hx of kidney stones  EXAM: ABDOMEN - 1 VIEW  COMPARISON:  11/17/2021  FINDINGS: The bowel gas pattern is normal. Hepatomegaly. No radio-opaque calculi or other significant radiographic abnormality are seen. Moderate-large volume of stool throughout the colon.  IMPRESSION: 1. No radiopaque calculi identified. 2. Hepatomegaly. 3. Moderate-large volume of stool throughout the colon.   Electronically Signed By: Mabel Converse D.O. On: 04/27/2022 09:35  No results found for this or any previous visit.  No results found for this or any previous visit.  No results found for this or any previous visit.  Results for orders placed in visit on 03/28/17  US  Renal  Narrative CLINICAL DATA:  2-3 weeks of left sided renal colic symptoms. Known bilateral kidney stones.  EXAM: RENAL / URINARY TRACT ULTRASOUND COMPLETE  COMPARISON:  [facility] of March 25, 2017  FINDINGS: Right Kidney:  Length: 10.8 cm. The renal cortical echotexture remains lower than that of the liver. There is no hydronephrosis. No discrete stones are observed.  Left Kidney:  Length: 12.2 cm. The renal cortical echotexture is similar to that on the right. There is no  hydronephrosis. No stones are evident.  Bladder:  Appears normal for degree of bladder distention.  IMPRESSION: Normal renal ultrasound examination.  No hydronephrosis.   Electronically Signed By: David  Jordan M.D. On: 03/29/2017 14:53  No results found for this or any previous visit.  No results found for this or any previous visit.  Results for orders placed in visit on 05/30/24  CT RENAL STONE STUDY  Narrative EXAM: CT UROGRAM 06/02/2024 05:57:45 PM  TECHNIQUE: CT of the abdomen and pelvis was performed without the administration of intravenous contrast as per CT urogram protocol. Multiplanar reformatted images as well as MIP urogram images are provided for review. Automated exposure control, iterative reconstruction, and/or weight based adjustment of the mA/kV was utilized to reduce the radiation dose to as low as reasonably achievable.  COMPARISON: 05/2022.  CLINICAL HISTORY: Nephrolithiasis; Urolithiasis, symptomatic; MD Found kidney stone crystals in urine last week; 2 months of symptoms; Lower back pain, blood in urine.  FINDINGS:  LOWER CHEST: No acute abnormality.  LIVER: The liver is unremarkable.  GALLBLADDER AND BILE DUCTS: Gallbladder is unremarkable. No biliary ductal dilatation.  SPLEEN: No acute abnormality.  PANCREAS: No acute abnormality.  ADRENAL GLANDS: No acute abnormality.  KIDNEYS, URETERS AND BLADDER: 4 mm calculus in the lower pole of the right kidney. No stones in the ureters. No hydronephrosis. No perinephric or periureteral stranding. Urinary bladder is unremarkable.  GI AND BOWEL: Stomach demonstrates no acute abnormality. There is no bowel obstruction.  PERITONEUM AND RETROPERITONEUM: No ascites. No free air.  VASCULATURE: Aorta is normal in caliber.  LYMPH NODES: No lymphadenopathy.  REPRODUCTIVE ORGANS: Prior hysterectomy. No pelvic mass or other significant abnormality identified.  BONES AND SOFT  TISSUES: No acute osseous abnormality. No focal soft tissue abnormality.  IMPRESSION: 1. 4 mm calculus in the lower pole of the right kidney. No evidence of ureteral calculi, hydronephrosis, or other acute findings.  Electronically signed by: Norleen Kil MD 06/04/2024 09:16 AM EDT RP Workstation: [id number]   Assessment & Plan:    1. Nephrolithiasis (Primary) Followup 6 months with [facility]  2.  Microhematuria Followup 6 months with [facility]   No follow-ups on file.  Belvie Clara, MD  [facility] [place]

## 2024-07-12 LAB — URINALYSIS, ROUTINE W REFLEX MICROSCOPIC
Bilirubin, UA: NEGATIVE
Glucose, UA: NEGATIVE
Ketones, UA: NEGATIVE
Leukocytes,UA: NEGATIVE
Nitrite, UA: NEGATIVE
Protein,UA: NEGATIVE
Specific Gravity, UA: 1.025 (ref 1.005–1.030)
Urobilinogen, Ur: 0.2 mg/dL (ref 0.2–1.0)
pH, UA: 6 (ref 5.0–7.5)

## 2024-07-12 LAB — MICROSCOPIC EXAMINATION: Bacteria, UA: NONE SEEN

## 2024-07-17 ENCOUNTER — Encounter: Payer: Self-pay | Admitting: Urology

## 2024-07-17 NOTE — Patient Instructions (Signed)

## 2024-07-19 ENCOUNTER — Ambulatory Visit (HOSPITAL_COMMUNITY)
Admission: RE | Admit: 2024-07-19 | Discharge: 2024-07-19 | Disposition: A | Discharge status: Home / Self Care | Source: Ambulatory Visit | Attending: Acute Care | Admitting: Acute Care

## 2024-07-19 DIAGNOSIS — Z122 Encounter for screening for malignant neoplasm of respiratory organs: Secondary | ICD-10-CM | POA: Insufficient documentation

## 2024-07-19 DIAGNOSIS — F17210 Nicotine dependence, cigarettes, uncomplicated: Secondary | ICD-10-CM | POA: Insufficient documentation

## 2024-07-19 DIAGNOSIS — Z87891 Personal history of nicotine dependence: Secondary | ICD-10-CM | POA: Insufficient documentation

## 2024-07-24 ENCOUNTER — Other Ambulatory Visit: Payer: Self-pay | Admitting: Acute Care

## 2024-07-24 DIAGNOSIS — Z122 Encounter for screening for malignant neoplasm of respiratory organs: Secondary | ICD-10-CM

## 2024-07-24 DIAGNOSIS — Z87891 Personal history of nicotine dependence: Secondary | ICD-10-CM

## 2024-07-24 DIAGNOSIS — F17210 Nicotine dependence, cigarettes, uncomplicated: Secondary | ICD-10-CM

## 2024-07-30 ENCOUNTER — Encounter: Payer: Self-pay | Admitting: Nurse Practitioner

## 2024-07-30 ENCOUNTER — Telehealth: Payer: Self-pay | Admitting: Family Medicine

## 2024-07-30 ENCOUNTER — Encounter: Payer: Self-pay | Admitting: Family Medicine

## 2024-07-30 NOTE — Telephone Encounter (Signed)
 Nurses Please order lipid as well as CMP, vitamin D  Diagnosis-hyperlipidemia-high risk med-vitamin D  deficiency  Patient has an appointment with [facility] in January I will send the patient a MyChart message encouraging her to get these labs done before that visit Thanks-Dr. Glendia

## 2024-07-31 ENCOUNTER — Other Ambulatory Visit: Payer: Self-pay

## 2024-07-31 MED ORDER — ONDANSETRON 8 MG PO TBDP: ORAL_TABLET | ORAL | 0 refills | Status: AC

## 2024-08-03 ENCOUNTER — Ambulatory Visit
Admission: RE | Admit: 2024-08-03 | Discharge: 2024-08-03 | Disposition: A | Discharge status: Home / Self Care | Payer: Self-pay | Source: Ambulatory Visit | Attending: Family Medicine | Admitting: Family Medicine

## 2024-08-03 VITALS — BP 136/82 | HR 86 | Temp 98.4°F | Resp 16

## 2024-08-03 DIAGNOSIS — Z20828 Contact with and (suspected) exposure to other viral communicable diseases: Secondary | ICD-10-CM

## 2024-08-03 DIAGNOSIS — J441 Chronic obstructive pulmonary disease with (acute) exacerbation: Secondary | ICD-10-CM | POA: Diagnosis not present

## 2024-08-03 DIAGNOSIS — J069 Acute upper respiratory infection, unspecified: Secondary | ICD-10-CM | POA: Diagnosis not present

## 2024-08-03 LAB — POCT INFLUENZA A/B
Influenza A, POC: NEGATIVE
Influenza B, POC: NEGATIVE

## 2024-08-03 MED ORDER — AZELASTINE HCL 0.1 % NA SOLN: 1.0000 | Freq: Two times a day (BID) | NASAL | 0 refills | Status: AC

## 2024-08-03 MED ORDER — PREDNISONE 20 MG PO TABS: 40.0000 mg | ORAL_TABLET | Freq: Every day | ORAL | 0 refills | Status: DC

## 2024-08-03 MED ORDER — PROMETHAZINE-DM 6.25-15 MG/5ML PO SYRP: 5.0000 mL | ORAL_SOLUTION | Freq: Four times a day (QID) | ORAL | 0 refills | Status: AC | PRN

## 2024-08-03 NOTE — Discharge Instructions (Signed)
 Your flu test was negative today but given all of your close exposures you likely do have the flu.  We will treat with prednisone , medications to help with your symptoms and you may do saline sinus rinses, over-the-counter cold and congestion medications.  Follow-up for significant worsening symptoms.

## 2024-08-03 NOTE — ED Triage Notes (Signed)
 Productive Cough , nasal congestion and headache since Monday.  Has been taking mucinex

## 2024-08-03 NOTE — ED Provider Notes (Signed)
 " [facility]    CSN: [id number] Arrival date & time: 08/03/24  1647      History   Chief Complaint Chief Complaint  Patient presents with   Cough    Coughing - Entered by patient    HPI Isabel Atkinson is a 58 y.o. female.   Patient presenting today with 4-day history of productive cough, congestion, headache, fatigue.  Denies fever, chest pain, shortness of breath, abdominal pain, vomiting, diarrhea.  Tried Mucinex with minimal relief.  Multiple home exposures to flu B.    Past Medical History:  Diagnosis Date   Anxiety    COPD (chronic obstructive pulmonary disease) (HCC)    Dysrhythmia    GERD (gastroesophageal reflux disease)    Hypercholesterolemia    Pre-diabetes    Reactive airway disease    Tachycardia     Patient Active Problem List   Diagnosis Date Noted   Muscle spasms of neck 05/31/2024   Muscle strain of upper back 05/31/2024   Chronic rhinitis 05/20/2024   Deviated nasal septum 05/20/2024   Hypertrophy of nasal turbinates 05/20/2024   Encounter for long-term opiate analgesic use 02/26/2024   Hip pain 02/26/2024   Insect bite of right lower leg 02/26/2024   Medication intolerance 02/06/2024   Bacterial URI 10/19/2023   Rectal pain 06/01/2022   Left sided abdominal pain 06/01/2022   Hematuria 11/17/2021   Encounter for screening colonoscopy 11/03/2021   Coronary artery disease involving native heart without angina pectoris 02/26/2020   Aortic atherosclerosis 02/26/2020   Current smoker 12/04/2019   Neurodermatitis 06/28/2016   COPD (chronic obstructive pulmonary disease) (HCC) 06/10/2016   Osteoporosis 07/20/2014   Irritable bowel syndrome 07/05/2014   Esophageal dysphagia 01/11/2012   GERD (gastroesophageal reflux disease) 01/11/2012   Chronic diarrhea 01/11/2012    Past Surgical History:  Procedure Laterality Date   BREAST BIOPSY Left 05/22/2024   US  LT BREAST BX W LOC DEV 1ST LESION IMG BX SPEC US  GUIDE 05/22/2024 Mir,  Aliene SAUNDERS, MD [facility]   CATARACT EXTRACTION W/PHACO Left 01/17/2017   Procedure: CATARACT EXTRACTION PHACO AND INTRAOCULAR LENS PLACEMENT (IOC);  Surgeon: Perley Hamilton, MD;  Location: [facility];  Service: Ophthalmology;  Laterality: Left;  CDE: 6.92   CATARACT EXTRACTION W/PHACO Right 03/07/2020   Procedure: CATARACT EXTRACTION PHACO AND INTRAOCULAR LENS PLACEMENT RIGHT EYE;  Surgeon: Harrie Agent, MD;  Location: [facility];  Service: Ophthalmology;  Laterality: Right;  CDE: 8.44   COLONOSCOPY  2013   RMR: Normal rectum. single diminutive sigmoid polyp noted above. Normal terminal ileum. Status post segmental biopsy. next TCS in 10 years.    COLONOSCOPY WITH PROPOFOL  N/A 01/27/2022   Procedure: COLONOSCOPY WITH PROPOFOL ;  Surgeon: Shaaron Lamar HERO, MD;  Location: [facility];  Service: Endoscopy;  Laterality: N/A;  12:45pm   DENTAL SURGERY  01/2024   ELBOW SURGERY Right 2023   ESOPHAGOGASTRODUODENOSCOPY  2013   RMR: Possible cervical esophageal web-status post dilation as described above. Small hiatal hernia. Status post biopsy of normal appearing small bowel to screen for celiac disease.    ESOPHAGOGASTRODUODENOSCOPY N/A 05/06/2016   normal esophagus, small hiatal hernia, empiric dilation.   ESOPHAGOGASTRODUODENOSCOPY (EGD) WITH PROPOFOL  N/A 01/27/2022   Procedure: ESOPHAGOGASTRODUODENOSCOPY (EGD) WITH PROPOFOL ;  Surgeon: Shaaron Lamar HERO, MD;  Location: [facility];  Service: Endoscopy;  Laterality: N/A;   EYE SURGERY Bilateral    Cat Sx - Symfony MF IOLs   MALONEY DILATION N/A 05/06/2016   Procedure: MALONEY DILATION;  Surgeon:  Lamar CHRISTELLA Hollingshead, MD;  Location: [facility];  Service: Endoscopy;  Laterality: N/A;   MALONEY DILATION N/A 01/27/2022   Procedure: AGAPITO DILATION;  Surgeon: Hollingshead Lamar CHRISTELLA, MD;  [facility]: [facility];  Service: Endoscopy;  Laterality: N/A;   NECK SURGERY  10/2012   OVARIAN CYST SURGERY  1997   removed   POLYPECTOMY  01/27/2022   Procedure: POLYPECTOMY;   Surgeon: Hollingshead Lamar CHRISTELLA, MD;  [facility]: [facility];  Service: Endoscopy;;  cecal   ROBOTIC ASSISTED LAPAROSCOPIC HYSTERECTOMY AND SALPINGECTOMY Bilateral 12/14/2022   Procedure: XI ROBOTIC ASSISTED LAPAROSCOPIC HYSTERECTOMY AND SALPINGO-OOPHORECTOMY;  Surgeon: Jayne Vonn DEL, MD;  Location: [facility];  Service: Gynecology;  Laterality: Bilateral;   TUBAL LIGATION  2000   VAGINAL PROLAPSE REPAIR N/A 12/14/2022   Procedure: VAGINAL VAULT SUSPENSION;  Surgeon: Jayne Vonn DEL, MD;  Location: [facility];  Service: Gynecology;  Laterality: N/A;   YAG LASER APPLICATION Bilateral     OB History     Gravida  3   Para  2   Term  2   Preterm      AB  1   Living  2      SAB      IAB      Ectopic      Multiple      Live Births               Home Medications    Prior to Admission medications  Medication Sig Start Date End Date Taking? Authorizing Provider  azelastine  (ASTELIN ) 0.1 % nasal spray Place 1 spray into both nostrils 2 (two) times daily. Use in each nostril as directed 08/03/24  Yes Stuart Vernell Norris, PA-C  predniSONE  (DELTASONE ) 20 MG tablet Take 2 tablets (40 mg total) by mouth daily with breakfast. 08/03/24  Yes Stuart Vernell Norris, PA-C  promethazine -dextromethorphan (PROMETHAZINE -DM) 6.25-15 MG/5ML syrup Take 5 mLs by mouth 4 (four) times daily as needed. 08/03/24  Yes Stuart Vernell Norris, PA-C  albuterol  (VENTOLIN  HFA) 108 (90 Base) MCG/ACT inhaler Inhale 2 puffs into the lungs every 4 (four) hours as needed. 06/13/24   Stuart Vernell Norris, PA-C  ALPRAZolam  (XANAX ) 0.25 MG tablet TAKE 1/2 TO 1 TABLET BY MOUTH TWICE DAILY AS NEEDED FOR ANXIETY. 02/04/20   Alphonsa Glendia LABOR, MD  aspirin  EC 81 MG tablet Take 81 mg by mouth daily. Swallow whole.    [provider]  Bromfenac  Sodium (PROLENSA ) 0.07 % SOLN Place 1 drop into the right eye daily. 09/24/22   Valdemar Rogue, MD  Cholecalciferol (VITAMIN D3) 125 MCG (5000 UT) TABS Take 1 tablet (5,000 Units  total) by mouth daily. 10/07/23   Okey Vina GAILS, MD  cycloSPORINE (RESTASIS) 0.05 % ophthalmic emulsion Place 1 drop into both eyes 2 (two) times daily.    [provider]  EPINEPHrine  0.3 mg/0.3 mL IJ SOAJ injection Inject 0.3 mg into the muscle as needed for anaphylaxis. 03/02/24   Alphonsa Glendia LABOR, MD  estradiol  (ESTRACE ) 0.1 MG/GM vaginal cream Place 0.5 Applicatorfuls vaginally every other day. 07/11/23   [provider]  ibuprofen  (ADVIL ) 600 MG tablet Take 1 tablet (600 mg total) by mouth every 8 (eight) hours as needed. 03/28/23   Alphonsa Glendia LABOR, MD  magic mouthwash (nystatin , hydrocortisone , diphenhydrAMINE , lidocaine ) suspension Swish and spit 5 mLs 4 (four) times daily as needed for mouth pain. 12/20/23   Mauro Elveria BROCKS, NP  nystatin  (MYCOSTATIN ) 100000 UNIT/ML suspension Take 5 mLs (500,000 Units total)  by mouth 4 (four) times daily. 12/30/23   Alphonsa Glendia LABOR, MD  omeprazole  (PRILOSEC) 40 MG capsule Take 1 capsule (40 mg total) by mouth daily as needed. 02/25/23   Ezzard Sonny RAMAN, PA-C  ondansetron  (ZOFRAN -ODT) 8 MG disintegrating tablet DISSOLVE 1 TABLET ON TONGUE EVERY 8 HOURS AS NEEDED FOR NAUSEA/VOMITING 07/31/24   Luking, Glendia LABOR, MD  OVER THE COUNTER MEDICATION mucinex    [provider]  oxyCODONE -acetaminophen  (PERCOCET) 7.5-325 MG tablet Take one tab twice daily as needed for pain. Caution drowsiness. Home use only. May fill 30 days from 05/31/24 05/31/24   Mauro Elveria BROCKS, NP  oxyCODONE -acetaminophen  (PERCOCET) 7.5-325 MG tablet Take one tab po BID prn pain. Caution drowsiness. Home use only. 05/31/24   Hoskins, Carolyn C, NP  oxyCODONE -acetaminophen  (PERCOCET) 7.5-325 MG tablet 1 twice daily as needed pain caution drowsiness home use only 06/28/24   Luking, Glendia LABOR, MD  phenazopyridine  (PYRIDIUM ) 100 MG tablet Take 1 tablet (100 mg total) by mouth 3 (three) times daily as needed for pain. 05/15/24   Alphonsa Glendia LABOR, MD  rosuvastatin  (CRESTOR ) 10 MG  tablet Take 1 tablet (10 mg total) by mouth daily. 11/10/23   Alphonsa Glendia LABOR, MD  Teriparatide  (FORTEO ) 560 MCG/2.24ML SOPN Inject 20 mcg into the skin daily at 2 PM. 02/08/24   Nida, Gebreselassie W, MD  Triamcinolone  Acetonide (NASACORT  ALLERGY 24HR NA) Place 2 sprays into the nose daily.    [provider]    Family History Family History  Problem Relation Age of Onset   Colon cancer Maternal Grandfather 15   Colon polyps Mother 63   Colon cancer Maternal Uncle 58   Hypertension Father    Diabetes Father    Heart disease Father    Hyperlipidemia Father    Stroke Father     Social History Social History[1]   Allergies   Ciprofloxacin , Augmentin  [amoxicillin -pot clavulanate], Carafate  [sucralfate ], Celexa  [citalopram  hydrobromide], Chantix [varenicline], Sulfa antibiotics, Sulfur, Wellbutrin  [bupropion ], Cefdinir , Fosamax  [alendronate  sodium], and Gabapentin    Review of Systems Review of Systems PER HPI  Physical Exam Triage Vital Signs ED Triage Vitals  Encounter Vitals Group     BP 08/03/24 1724 136/82     Girls Systolic BP Percentile --      Girls Diastolic BP Percentile --      Boys Systolic BP Percentile --      Boys Diastolic BP Percentile --      Pulse Rate 08/03/24 1724 86     Resp 08/03/24 1724 16     Temp 08/03/24 1724 98.4 F (36.9 C)     Temp Source 08/03/24 1724 Oral     SpO2 08/03/24 1724 95 %     Weight --      Height --      Head Circumference --      Peak Flow --      Pain Score 08/03/24 1725 5     Pain Loc --      Pain Education --      Exclude from Growth Chart --    No data found.  Updated Vital Signs BP 136/82 (BP Location: Right Arm)   Pulse 86   Temp 98.4 F (36.9 C) (Oral)   Resp 16   SpO2 95%   Visual Acuity Right Eye Distance:   Left Eye Distance:   Bilateral Distance:    Right Eye Near:   Left Eye Near:    Bilateral Near:     Physical Exam  Vitals and nursing note reviewed.  Constitutional:      Appearance:  Normal appearance.  HENT:     Head: Atraumatic.     Right Ear: Tympanic membrane and external ear normal.     Left Ear: Tympanic membrane and external ear normal.     Nose: Rhinorrhea present.     Mouth/Throat:     Mouth: Mucous membranes are moist.     Pharynx: Posterior oropharyngeal erythema present.  Eyes:     Extraocular Movements: Extraocular movements intact.     Conjunctiva/sclera: Conjunctivae normal.  Cardiovascular:     Rate and Rhythm: Normal rate and regular rhythm.     Heart sounds: Normal heart sounds.  Pulmonary:     Effort: Pulmonary effort is normal.     Breath sounds: Normal breath sounds. No wheezing or rales.  Musculoskeletal:        General: Normal range of motion.     Cervical back: Normal range of motion and neck supple.  Skin:    General: Skin is warm and dry.  Neurological:     Mental Status: She is alert and oriented to person, place, and time.  Psychiatric:        Mood and Affect: Mood normal.        Thought Content: Thought content normal.      UC Treatments / Results  Labs (all labs ordered are listed, but only abnormal results are displayed) Labs Reviewed  POCT INFLUENZA A/B    EKG   Radiology No results found.  Procedures Procedures (including critical care time)  Medications Ordered in UC Medications - No data to display  Initial Impression / Assessment and Plan / UC Course  I have reviewed the triage vital signs and the nursing notes.  Pertinent labs & imaging results that were available during my care of the patient were reviewed by me and considered in my medical decision making (see chart for details).     Vitals and exam overall reassuring today, rapid flu negative but given symptoms and close home exposures do suspect influenza.  Will treat for this and COPD exacerbation with prednisone , Astelin , Phenergan  DM.  Discussed supportive over-the-counter medications and home care.  Return for worsening or unresolving  symptoms.  Final Clinical Impressions(s) / UC Diagnoses   Final diagnoses:  Viral URI with cough  Exposure to influenza  COPD exacerbation [facility])     Discharge Instructions      Your flu test was negative today but given all of your close exposures you likely do have the flu.  We will treat with prednisone , medications to help with your symptoms and you may do saline sinus rinses, over-the-counter cold and congestion medications.  Follow-up for significant worsening symptoms.    ED Prescriptions     Medication Sig Dispense Auth. Provider   predniSONE  (DELTASONE ) 20 MG tablet Take 2 tablets (40 mg total) by mouth daily with breakfast. 10 tablet Stuart Vernell Norris, PA-C   azelastine  (ASTELIN ) 0.1 % nasal spray Place 1 spray into both nostrils 2 (two) times daily. Use in each nostril as directed 30 mL Stuart Vernell Norris, PA-C   promethazine -dextromethorphan (PROMETHAZINE -DM) 6.25-15 MG/5ML syrup Take 5 mLs by mouth 4 (four) times daily as needed. 100 mL Stuart Vernell Norris, [place]      PDMP not reviewed this encounter.    [1]  Social History Tobacco Use   Smoking status: Every Day    Current packs/day: 1.00    Average packs/day: 1 pack/day for  25.0 years (25.0 ttl pk-yrs)    Types: Cigarettes   Smokeless tobacco: Never  Vaping Use   Vaping status: Never Used  Substance Use Topics   Alcohol use: No    Alcohol/week: 0.0 standard drinks of alcohol   Drug use: No     Stuart Vernell Norris, PA-C 08/03/24 1817  "

## 2024-08-07 ENCOUNTER — Ambulatory Visit: Admitting: "Endocrinology

## 2024-08-08 ENCOUNTER — Other Ambulatory Visit: Payer: Self-pay

## 2024-08-08 DIAGNOSIS — E1169 Type 2 diabetes mellitus with other specified complication: Secondary | ICD-10-CM

## 2024-08-08 DIAGNOSIS — E559 Vitamin D deficiency, unspecified: Secondary | ICD-10-CM

## 2024-08-08 DIAGNOSIS — Z79899 Other long term (current) drug therapy: Secondary | ICD-10-CM

## 2024-08-08 NOTE — Telephone Encounter (Signed)
"  Labs have been placed   "

## 2024-08-10 ENCOUNTER — Ambulatory Visit: Payer: Self-pay

## 2024-08-10 NOTE — Telephone Encounter (Signed)
 FYI Only or Action Required?: FYI only for provider: Patient going to UC tomorrow, denied scheduling.  Patient was last seen in primary care on 05/31/2024 by Mauro Elveria BROCKS, NP.  Called Nurse Triage reporting Cough.  Symptoms began several weeks ago.  Interventions attempted: Prescription medications:  prednisone , Astelin , Phenergan  DM.  Symptoms are: unchanged.  Triage Disposition: See PCP When Office is Open (Within 3 Days)  Patient/caregiver understands and will follow disposition?:    Reason for Disposition  Cough present > 3 weeks  Answer Assessment - Initial Assessment Questions Not using inhalers. Seen in UC 12/26, given  prednisone , Astelin , Phenergan  DM also taking Mucinex. Advised patient next available appointment is Monday, patient expressed disappointment in not being able to be seen today, there is never any appointments available and its like not even having a PCP. Patient denied scheduling and states she has an UC appointment tomorrow.   1. SYMPTOMS: What is your main symptom or concern? (e.g., cough, fever, shortness of breath, muscle aches)      fatigue, productive cough, clear or green, congestion  2. ONSET: When did the symptoms start?      Almost 2 weeks   3. COUGH: Do you have a cough? If Yes, ask: How bad is the cough?       Productive  4. FEVER: Do you have a fever? If Yes, ask: What is your temperature, how was it measured, and when did it start?     99.6  5. BREATHING DIFFICULTY: Are you having any difficulty breathing? (e.g., normal; shortness of breath, wheezing, unable to speak)      Denies  6. BETTER-SAME-WORSE: Are you getting better, staying the same or getting worse compared to yesterday?  If getting worse, ask, In what way?     Cough worsening   7. OTHER SYMPTOMS: Do you have any other symptoms?  (e.g., chills, fatigue, headache, loss of smell or taste, muscle pain, sore throat)     Headache, sore throat, body  aches  8. INFLUENZA EXPOSURE: Was there any known exposure to influenza (flu) before the symptoms began?      Grandson, Flu B  9. HIGH RISK FOR COMPLICATIONS: Do you have any chronic medical problems? (e.g., asthma, heart or lung disease, obesity, weak immune system)       COPD  10. O2 SATURATION MONITOR:  Do you use an oxygen saturation monitor (pulse oximeter) at home? If Yes, ask What is your reading (oxygen level) today? What is your usual oxygen saturation reading? (e.g., 95%)       96% 1 hour ago  Protocols used: Influenza (Flu) Suspected-A-AH  Message from [place] sent at 08/10/2024 11:08 AM EST  Reason for Triage: coughing and congestion and overall doesn't feel good, went to uc and not better. Wanted to speak with a  nurse [phone]

## 2024-08-15 ENCOUNTER — Ambulatory Visit: Admitting: Family Medicine

## 2024-08-15 VITALS — BP 116/81 | HR 79 | Temp 98.4°F | Ht 60.0 in | Wt 96.4 lb

## 2024-08-15 DIAGNOSIS — J441 Chronic obstructive pulmonary disease with (acute) exacerbation: Secondary | ICD-10-CM

## 2024-08-15 MED ORDER — AZITHROMYCIN 250 MG PO TABS: ORAL_TABLET | ORAL | 0 refills | Status: AC

## 2024-08-15 NOTE — Assessment & Plan Note (Signed)
 Given patient's persistent symptoms and lack of improvement, I am placing her on azithromycin .  Supportive care.

## 2024-08-15 NOTE — Patient Instructions (Signed)
 Rest. Fluids.  Medication as prescribed.   Take care  Dr. Adriana Simas

## 2024-08-15 NOTE — Progress Notes (Signed)
 "  Subjective:  Patient ID: Isabel Atkinson, female    DOB: 08/23/1965  Age: 59 y.o. MRN: [id number]  CC:   Chief Complaint  Patient presents with   Diarrhea   Dry Mouth   Sore Throat   Nasal Congestion   Headache   Cough   Night Sweats   Fatigue    PT states these symptoms started Dec. 24th. Went to urgent care twice, negative flu & covid Prescribed prednisone  and doxycycline - no help 8/9 family members sick w/ flu    HPI:  59 year old female presents for evaluation of the above.  Has been sick since 12/24. Has had exposure to influenza.  She has tested negative for influenza. Reports continued symptoms.  She reports generalized weakness, night sweats, cough, headache, congestion, sore throat.  She has had some diarrhea as well.  She has been treated with corticosteroids and doxycycline  without resolution.  Has known history of COPD.  Continues to smoke.  Patient Active Problem List   Diagnosis Date Noted   COPD exacerbation (HCC) 08/15/2024   Chronic rhinitis 05/20/2024   Deviated nasal septum 05/20/2024   Hypertrophy of nasal turbinates 05/20/2024   Encounter for long-term opiate analgesic use 02/26/2024   Coronary artery disease involving native heart without angina pectoris 02/26/2020   Aortic atherosclerosis 02/26/2020   Current smoker 12/04/2019   Neurodermatitis 06/28/2016   COPD (chronic obstructive pulmonary disease) (HCC) 06/10/2016   Osteoporosis 07/20/2014   Irritable bowel syndrome 07/05/2014   Esophageal dysphagia 01/11/2012   GERD (gastroesophageal reflux disease) 01/11/2012   Chronic diarrhea 01/11/2012    Social Hx   Social History   Socioeconomic History   Marital status: Married    Spouse name: Not on file   Number of children: 2   Years of education: GED   Highest education level: GED or equivalent  Occupational History   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    Employer: [employer]  Tobacco Use   Smoking status: Every Day    Current  packs/day: 1.00    Average packs/day: 1 pack/day for 25.0 years (25.0 ttl pk-yrs)    Types: Cigarettes   Smokeless tobacco: Never  Vaping Use   Vaping status: Never Used  Substance and Sexual Activity   Alcohol use: No    Alcohol/week: 0.0 standard drinks of alcohol   Drug use: No   Sexual activity: Yes    Birth control/protection: Post-menopausal, Surgical    Comment: hysterstectomy  Other Topics Concern   Not on file  Social History Narrative   Lives at home w/ her husband   2 children - ages 55 & 72   Right-sided   Caffeine: 2 beverages per day   Social Drivers of Health   Tobacco Use: High Risk (08/03/2024)   Patient History    Smoking Tobacco Use: Every Day    Smokeless Tobacco Use: Never    Passive Exposure: Not on file  Financial Resource Strain: Low Risk (08/15/2024)   Overall Financial Resource Strain (CARDIA)    Difficulty of Paying Living Expenses: Not hard at all  Food Insecurity: Unknown (08/15/2024)   Epic    Worried About Programme Researcher, Broadcasting/film/video in the Last Year: Never true    [employer] of Food in the Last Year: Not on file  Transportation Needs: No Transportation Needs (08/15/2024)   Epic    Lack of Transportation (Medical): No    Lack of Transportation (Non-Medical): No  Physical Activity: Insufficiently Active (08/15/2024)   Exercise  Vital Sign    Days of Exercise per Week: 5 days    Minutes of Exercise per Session: 10 min  Stress: No Stress Concern Present (08/15/2024)   [employer] of Occupational Health - Occupational Stress Questionnaire    Feeling of Stress: Not at all  Social Connections: Socially Integrated (08/15/2024)   Social Connection and Isolation Panel    Frequency of Communication with Friends and Family: More than three times a week    Frequency of Social Gatherings with Friends and Family: Twice a week    Attends Religious Services: More than 4 times per year    Active Member of Clubs or Organizations: Yes    Attends Banker  Meetings: More than 4 times per year    Marital Status: Married  Depression (PHQ2-9): Low Risk (12/30/2023)   Depression (PHQ2-9)    PHQ-2 Score: 0  Alcohol Screen: Low Risk (06/08/2022)   Alcohol Screen    Last Alcohol Screening Score (AUDIT): 0  Housing: Low Risk (08/15/2024)   Epic    Unable to Pay for Housing in the Last Year: No    Number of Times Moved in the Last Year: 0    Homeless in the Last Year: No  Utilities: Not At Risk (06/08/2022)   AHC Utilities    Threatened with loss of utilities: No  Health Literacy: Not on file    Review of Systems Per HPI  Objective:  BP 116/81   Pulse 79   Temp 98.4 F (36.9 C)   Ht 5' (1.524 m)   Wt 96 lb 6.4 oz (43.7 kg)   SpO2 99%   BMI 18.83 kg/m      08/15/2024    4:05 PM 08/03/2024    5:24 PM 07/11/2024    3:11 PM  BP/Weight  Systolic BP 116 136 122  Diastolic BP 81 82 72  Wt. (Lbs) 96.4    BMI 18.83 kg/m2      Physical Exam Vitals and nursing note reviewed.  Constitutional:      General: She is not in acute distress.    Appearance: Normal appearance.  HENT:     Head: Normocephalic and atraumatic.     Mouth/Throat:     Pharynx: Posterior oropharyngeal erythema present.  Eyes:     General:        Right eye: No discharge.        Left eye: No discharge.     Conjunctiva/sclera: Conjunctivae normal.  Cardiovascular:     Rate and Rhythm: Normal rate and regular rhythm.  Pulmonary:     Effort: Pulmonary effort is normal.     Breath sounds: No wheezing or rales.  Neurological:     Mental Status: She is alert.     Lab Results  Component Value Date   WBC 8.4 11/24/2023   HGB 12.9 11/24/2023   HCT 40.1 11/24/2023   PLT 293 11/24/2023   GLUCOSE 83 01/25/2024   CHOL 182 08/12/2023   TRIG 66 10/04/2023   HDL 70 10/04/2023   LDLCALC 98 08/12/2023   ALT 13 01/25/2024   AST 21 01/25/2024   NA 143 01/25/2024   K 4.0 01/25/2024   CL 105 01/25/2024   CREATININE 0.57 01/25/2024   BUN 15 01/25/2024   CO2 22  01/25/2024   TSH 1.310 10/23/2020   HGBA1C 5.7 (H) 12/10/2022     Assessment & Plan:  COPD exacerbation (HCC) Assessment & Plan: Given patient's persistent symptoms and lack of improvement, I  am placing her on azithromycin .  Supportive care.  Orders: -     Azithromycin ; Take 2 tablets on day 1, then 1 tablet daily on days 2 through 5  Dispense: 6 tablet; Refill: 0    Follow-up:  Return if symptoms worsen or fail to improve.  Jacqulyn Ahle DO [facility] "

## 2024-08-28 ENCOUNTER — Ambulatory Visit: Admitting: Nurse Practitioner

## 2024-08-28 VITALS — BP 130/78 | HR 82 | Temp 98.8°F | Ht 60.0 in | Wt 98.5 lb

## 2024-08-28 DIAGNOSIS — J069 Acute upper respiratory infection, unspecified: Secondary | ICD-10-CM | POA: Diagnosis not present

## 2024-08-28 DIAGNOSIS — R300 Dysuria: Secondary | ICD-10-CM | POA: Diagnosis not present

## 2024-08-28 DIAGNOSIS — F17200 Nicotine dependence, unspecified, uncomplicated: Secondary | ICD-10-CM

## 2024-08-28 DIAGNOSIS — B9689 Other specified bacterial agents as the cause of diseases classified elsewhere: Secondary | ICD-10-CM | POA: Diagnosis not present

## 2024-08-28 DIAGNOSIS — Z79891 Long term (current) use of opiate analgesic: Secondary | ICD-10-CM | POA: Diagnosis not present

## 2024-08-28 LAB — POCT URINALYSIS DIPSTICK
Bilirubin, UA: 2
Glucose, UA: NEGATIVE
Ketones, UA: NEGATIVE
Nitrite, UA: NEGATIVE
Protein, UA: NEGATIVE
Spec Grav, UA: 1.01
Urobilinogen, UA: 0.2 U/dL — AB
pH, UA: 7

## 2024-08-28 MED ORDER — DOXYCYCLINE HYCLATE 100 MG PO CAPS: 100.0000 mg | ORAL_CAPSULE | Freq: Two times a day (BID) | ORAL | 0 refills | Status: AC

## 2024-08-28 MED ORDER — PREDNISONE 20 MG PO TABS: ORAL_TABLET | ORAL | 0 refills | Status: AC

## 2024-08-28 NOTE — Progress Notes (Unsigned)
 "  Subjective:    Patient ID: Isabel Atkinson, female    DOB: [birth year], 59 y.o.   MRN: [id number]  HPI Patient is here for a medication follow up   Patient is wanting a UA and Strep test ran on her She stated she feels better but still wants to make sure for her and others safety  Discussed the use of AI scribe software for clinical note transcription with the patient, who gave verbal consent to proceed. Presents for chronic pain management.  This is mainly for hamstring injury but patient is also receiving care for neck issues.  Has had 1 session of physical therapy.  Takes 1-1-1/2 of the oxycodone  as needed for severe pain.  Describes her pain as an 8 at its worst and 5-6 once she takes medication.  Is able to work and perform her ADLs.  Denies any adverse effects.  Patient would like to switch back to hydrocodone  10 mg that she was previously on.  Desires to get off of oxycodone  if possible.  States she did well on this in the past.  Her last refill on oxycodone  was 1/17. Also complaints of a scratchy throat.  Mild head congestion.  Occasional cough.  No fever.  Some ear pressure with occasional pain.  No chest pain.  Occasional mild wheezing.  Has her albuterol  inhaler at home if needed.  GERD under good control with omeprazole .  Has had 2 visits recently for upper respiratory symptoms and COPD exacerbation, see previous notes.  Originally on doxycycline  and prednisone .  Was placed on a Z-Pak on the second visit with minimal improvement.  Taking fluids well.  Voiding normal limit.  Continues to smoke about 1 pack/day.  No difficulty swallowing or chronic hoarseness.   Slight discomfort with urination.  No pelvic pain, vaginal discharge.  Same female sexual partner.  No concerns about STDs.  Social History[1]     08/28/2024    3:05 PM  Depression screen PHQ 2/9  Decreased Interest 0  Down, Depressed, Hopeless 0  PHQ - 2 Score 0  Altered sleeping 0  Tired, decreased energy 0  Change in  appetite 0  Feeling bad or failure about yourself  0  Trouble concentrating 0  Moving slowly or fidgety/restless 0  Suicidal thoughts 0  PHQ-9 Score 0  Difficult doing work/chores Not difficult at all      08/28/2024    3:05 PM 08/28/2024    2:53 PM 12/30/2023   10:59 AM 10/19/2023   11:28 AM  GAD 7 : Generalized Anxiety Score  Nervous, Anxious, on Edge 0 0 0  0   Control/stop worrying 0 0 0  0   Worry too much - different things 0 0  0   Trouble relaxing 0 0  0   Restless 0 0  0   Easily annoyed or irritable 0 0  0   Afraid - awful might happen 0 0  0   Total GAD 7 Score 0 0  0  Anxiety Difficulty Not difficult at all Not difficult at all Not difficult at all Not difficult at all     Data saved with a previous flowsheet row definition        Objective:   Physical Exam NAD.  Alert, oriented.  TMs clear effusion bilaterally, no erythema.  Pharynx mildly injected with slightly discolored PND noted.  No significant erythema.  No lesions or exudate.  Neck supple with mild soft anterior cervical adenopathy.  Very tight  tender muscles noted in the lateral neck area bilaterally into the trapezius.  No CVA tenderness.  Lungs clear.  Heart regular rate rhythm.  Occasional productive cough noted with deep breath.  No tachypnea.  Abdomen soft nondistended nontender.  Results for orders placed or performed in visit on 08/28/24  POCT urinalysis dipstick   Collection Time: 08/28/24  3:24 PM  Result Value Ref Range   Color, UA yellow    Clarity, UA clear    Glucose, UA Negative Negative   Bilirubin, UA 2    Ketones, UA neg    Spec Grav, UA 1.010 1.010 - 1.025   Blood, UA 5-10    pH, UA 7.0 5.0 - 8.0   Protein, UA Negative Negative   Urobilinogen, UA 0.2 (A) 0.2 or 1.0 E.U./dL   Nitrite, UA neg    Leukocytes, UA Moderate (2+) (A) Negative   Appearance     Odor      Urine micro occasional WBC, no RBCs noted. Today's Vitals   08/28/24 1441  BP: 130/78  Pulse: 82  Temp: 98.8 F  (37.1 C)  SpO2: 99%  Weight: 98 lb 8 oz (44.7 kg)  Height: 5' (1.524 m)   Body mass index is 19.24 kg/m.     Assessment & Plan:  1. Bacterial URI Course of doxycycline  as directed.  Due to allergies and sensitivities very limited on choices of antibiotics.  Doxycycline  chosen to cover respiratory and urinary symptoms.  Patient can tolerate short course of prednisone  which was prescribed.  Continue to use albuterol  as directed as needed wheezing.  Warning signs reviewed.  Patient to call back if symptoms worsen or persist. - POCT urinalysis dipstick - doxycycline  (VIBRAMYCIN ) 100 MG capsule; Take 1 capsule (100 mg total) by mouth 2 (two) times daily.  Dispense: 14 capsule; Refill: 0 - predniSONE  (DELTASONE ) 20 MG tablet; Take 2 tabs (40 mg) total po once a day x 5 days  Dispense: 10 tablet; Refill: 0  2. Dysuria No significant findings noted under microscopic exam however doxycycline  is being prescribed today which should cover urinary symptoms as well as a precaution. - POCT urinalysis dipstick  3. Encounter for long-term opiate analgesic use (Primary) With her next prescription, will switch back to Vicodin 10/325 as directed.  Patient to let us  know before her medication runs out whether to continue the Vicodin or switch back to Percocet. - HYDROcodone -acetaminophen  (NORCO) 10-325 MG tablet; Take one tab po BID prn pain  Dispense: 32 tablet; Refill: 0  4. Current smoker Discussed importance of cutting back on smoking with a goal of half pack per day or less. Return in about 3 months (around 11/26/2024).         [1]  Social History Tobacco Use   Smoking status: Every Day    Current packs/day: 1.00    Average packs/day: 1 pack/day for 25.0 years (25.0 ttl pk-yrs)    Types: Cigarettes   Smokeless tobacco: Never  Vaping Use   Vaping status: Never Used  Substance Use Topics   Alcohol use: No    Alcohol/week: 0.0 standard drinks of alcohol   Drug use: No   "

## 2024-08-29 ENCOUNTER — Encounter: Payer: Self-pay | Admitting: Nurse Practitioner

## 2024-08-29 MED ORDER — HYDROCODONE-ACETAMINOPHEN 10-325 MG PO TABS: ORAL_TABLET | ORAL | 0 refills | Status: AC

## 2024-10-01 ENCOUNTER — Ambulatory Visit: Admitting: Internal Medicine

## 2024-11-15 ENCOUNTER — Ambulatory Visit: Admitting: Internal Medicine

## 2024-11-29 ENCOUNTER — Ambulatory Visit: Admitting: Nurse Practitioner

## 2025-01-16 ENCOUNTER — Ambulatory Visit: Admitting: Urology
